# Patient Record
Sex: Female | Born: 1940
Health system: Southern US, Community
[De-identification: ages and names within clinical notes are randomized; demographics above are authoritative.]

## PROBLEM LIST (undated history)

## (undated) ENCOUNTER — Ambulatory Visit (HOSPITAL_COMMUNITY): Admission: EM | Payer: Medicare Other

## (undated) DIAGNOSIS — Z87440 Personal history of urinary (tract) infections: Secondary | ICD-10-CM

## (undated) DIAGNOSIS — R011 Cardiac murmur, unspecified: Secondary | ICD-10-CM

## (undated) DIAGNOSIS — N189 Chronic kidney disease, unspecified: Secondary | ICD-10-CM

## (undated) DIAGNOSIS — E039 Hypothyroidism, unspecified: Secondary | ICD-10-CM

## (undated) DIAGNOSIS — I10 Essential (primary) hypertension: Secondary | ICD-10-CM

## (undated) DIAGNOSIS — E785 Hyperlipidemia, unspecified: Secondary | ICD-10-CM

## (undated) DIAGNOSIS — M797 Fibromyalgia: Secondary | ICD-10-CM

## (undated) DIAGNOSIS — F329 Major depressive disorder, single episode, unspecified: Secondary | ICD-10-CM

## (undated) DIAGNOSIS — L0292 Furuncle, unspecified: Secondary | ICD-10-CM

## (undated) DIAGNOSIS — Z800 Family history of malignant neoplasm of digestive organs: Secondary | ICD-10-CM

## (undated) DIAGNOSIS — K5903 Drug induced constipation: Secondary | ICD-10-CM

## (undated) DIAGNOSIS — M1990 Unspecified osteoarthritis, unspecified site: Secondary | ICD-10-CM

## (undated) DIAGNOSIS — F32A Depression, unspecified: Secondary | ICD-10-CM

## (undated) DIAGNOSIS — R6889 Other general symptoms and signs: Secondary | ICD-10-CM

## (undated) DIAGNOSIS — H579 Unspecified disorder of eye and adnexa: Secondary | ICD-10-CM

## (undated) DIAGNOSIS — K219 Gastro-esophageal reflux disease without esophagitis: Secondary | ICD-10-CM

## (undated) DIAGNOSIS — R112 Nausea with vomiting, unspecified: Secondary | ICD-10-CM

## (undated) DIAGNOSIS — R351 Nocturia: Secondary | ICD-10-CM

## (undated) DIAGNOSIS — R350 Frequency of micturition: Secondary | ICD-10-CM

## (undated) DIAGNOSIS — M75100 Unspecified rotator cuff tear or rupture of unspecified shoulder, not specified as traumatic: Secondary | ICD-10-CM

## (undated) DIAGNOSIS — E669 Obesity, unspecified: Secondary | ICD-10-CM

## (undated) DIAGNOSIS — Z9889 Other specified postprocedural states: Secondary | ICD-10-CM

## (undated) HISTORY — DX: Depression, unspecified: F32.A

## (undated) HISTORY — DX: Major depressive disorder, single episode, unspecified: F32.9

## (undated) HISTORY — DX: Obesity, unspecified: E66.9

## (undated) HISTORY — DX: Chronic kidney disease, unspecified: N18.9

## (undated) HISTORY — PX: COLONOSCOPY: SHX174

## (undated) HISTORY — PX: SHOULDER SURGERY: SHX246

## (undated) HISTORY — DX: Essential (primary) hypertension: I10

## (undated) HISTORY — PX: DILATION AND CURETTAGE OF UTERUS: SHX78

## (undated) HISTORY — PX: BACK SURGERY: SHX140

## (undated) HISTORY — DX: Fibromyalgia: M79.7

## (undated) HISTORY — DX: Gastro-esophageal reflux disease without esophagitis: K21.9

## (undated) HISTORY — PX: STAPEDECTOMY: SHX2435

## (undated) HISTORY — DX: Family history of malignant neoplasm of digestive organs: Z80.0

---

## 1969-05-30 HISTORY — PX: CHOLECYSTECTOMY: SHX55

## 1969-05-30 HISTORY — PX: TUBAL LIGATION: SHX77

## 1998-07-13 ENCOUNTER — Other Ambulatory Visit: Admission: RE | Admit: 1998-07-13 | Discharge: 1998-07-13 | Payer: Self-pay | Admitting: Obstetrics and Gynecology

## 1999-07-26 ENCOUNTER — Other Ambulatory Visit: Admission: RE | Admit: 1999-07-26 | Discharge: 1999-07-26 | Payer: Self-pay | Admitting: Obstetrics and Gynecology

## 1999-07-30 ENCOUNTER — Encounter (INDEPENDENT_AMBULATORY_CARE_PROVIDER_SITE_OTHER): Payer: Self-pay | Admitting: Specialist

## 1999-07-30 ENCOUNTER — Other Ambulatory Visit: Admission: RE | Admit: 1999-07-30 | Discharge: 1999-07-30 | Payer: Self-pay | Admitting: Obstetrics and Gynecology

## 2000-07-31 ENCOUNTER — Other Ambulatory Visit: Admission: RE | Admit: 2000-07-31 | Discharge: 2000-07-31 | Payer: Self-pay | Admitting: Obstetrics and Gynecology

## 2000-08-01 ENCOUNTER — Encounter (INDEPENDENT_AMBULATORY_CARE_PROVIDER_SITE_OTHER): Payer: Self-pay | Admitting: Specialist

## 2000-08-01 ENCOUNTER — Other Ambulatory Visit: Admission: RE | Admit: 2000-08-01 | Discharge: 2000-08-01 | Payer: Self-pay | Admitting: Obstetrics and Gynecology

## 2001-08-06 ENCOUNTER — Other Ambulatory Visit: Admission: RE | Admit: 2001-08-06 | Discharge: 2001-08-06 | Payer: Self-pay | Admitting: Obstetrics and Gynecology

## 2002-05-03 ENCOUNTER — Encounter: Payer: Self-pay | Admitting: Orthopaedic Surgery

## 2002-05-03 ENCOUNTER — Encounter: Admission: RE | Admit: 2002-05-03 | Discharge: 2002-05-03 | Payer: Self-pay | Admitting: Orthopaedic Surgery

## 2003-03-25 ENCOUNTER — Encounter: Admission: RE | Admit: 2003-03-25 | Discharge: 2003-06-23 | Payer: Self-pay | Admitting: Family Medicine

## 2003-10-06 ENCOUNTER — Encounter: Admission: RE | Admit: 2003-10-06 | Discharge: 2003-10-06 | Payer: Self-pay | Admitting: Orthopaedic Surgery

## 2003-10-29 ENCOUNTER — Encounter: Admission: RE | Admit: 2003-10-29 | Discharge: 2003-10-29 | Payer: Self-pay | Admitting: Orthopaedic Surgery

## 2003-11-18 ENCOUNTER — Encounter: Admission: RE | Admit: 2003-11-18 | Discharge: 2003-11-18 | Payer: Self-pay | Admitting: Orthopaedic Surgery

## 2003-11-19 ENCOUNTER — Encounter: Admission: RE | Admit: 2003-11-19 | Discharge: 2004-02-17 | Payer: Self-pay | Admitting: Family Medicine

## 2003-12-05 ENCOUNTER — Encounter: Admission: RE | Admit: 2003-12-05 | Discharge: 2003-12-05 | Payer: Self-pay | Admitting: Orthopaedic Surgery

## 2004-03-15 ENCOUNTER — Encounter: Admission: RE | Admit: 2004-03-15 | Discharge: 2004-03-15 | Payer: Self-pay | Admitting: Neurological Surgery

## 2005-10-13 ENCOUNTER — Encounter: Admission: RE | Admit: 2005-10-13 | Discharge: 2005-10-13 | Payer: Self-pay

## 2007-04-16 ENCOUNTER — Ambulatory Visit: Payer: Self-pay | Admitting: Gastroenterology

## 2007-05-01 ENCOUNTER — Ambulatory Visit: Payer: Self-pay | Admitting: Gastroenterology

## 2007-06-15 ENCOUNTER — Ambulatory Visit: Payer: Self-pay | Admitting: Cardiology

## 2007-06-25 ENCOUNTER — Ambulatory Visit: Payer: Self-pay

## 2007-10-23 ENCOUNTER — Encounter: Admission: RE | Admit: 2007-10-23 | Discharge: 2007-10-23 | Payer: Self-pay | Admitting: Orthopaedic Surgery

## 2008-06-16 ENCOUNTER — Ambulatory Visit: Payer: Self-pay | Admitting: Cardiology

## 2009-03-25 ENCOUNTER — Encounter: Admission: RE | Admit: 2009-03-25 | Discharge: 2009-03-25 | Payer: Self-pay | Admitting: Orthopaedic Surgery

## 2009-05-01 ENCOUNTER — Telehealth (INDEPENDENT_AMBULATORY_CARE_PROVIDER_SITE_OTHER): Payer: Self-pay | Admitting: *Deleted

## 2009-05-04 ENCOUNTER — Telehealth (INDEPENDENT_AMBULATORY_CARE_PROVIDER_SITE_OTHER): Payer: Self-pay | Admitting: *Deleted

## 2009-06-26 ENCOUNTER — Encounter: Admission: RE | Admit: 2009-06-26 | Discharge: 2009-06-26 | Payer: Self-pay | Admitting: Orthopaedic Surgery

## 2009-12-25 ENCOUNTER — Encounter: Payer: Self-pay | Admitting: Gastroenterology

## 2010-01-22 ENCOUNTER — Encounter: Admission: RE | Admit: 2010-01-22 | Discharge: 2010-01-22 | Payer: Self-pay | Admitting: Orthopaedic Surgery

## 2010-06-02 ENCOUNTER — Ambulatory Visit: Admit: 2010-06-02 | Payer: Self-pay | Admitting: Cardiology

## 2010-06-14 ENCOUNTER — Ambulatory Visit: Admit: 2010-06-14 | Payer: Self-pay | Admitting: Cardiology

## 2010-07-01 NOTE — Letter (Signed)
Summary: Deboraha Sprang @ [facility] @ [employer]   Imported By: Sherian Rein 04/06/2010 07:15:20  _____________________________________________________________________  External Attachment:    Type:   Image     Comment:   External Document

## 2010-07-01 NOTE — Letter (Signed)
Summary: Pt's Medical Hx & Medication/[facility]   Pt's Medical Hx & Medication/[facility]   Imported By: Sherian Rein 04/06/2010 07:17:05  _____________________________________________________________________  External Attachment:    Type:   Image     Comment:   External Document

## 2010-07-01 NOTE — Progress Notes (Signed)
Summary: Records requested and faxed  [facility]ed [facility] to [facility] Attn: Olivia to [phone]. Stress and EKG.Dena Chavis  May 01, 2009 11:12 AM

## 2010-07-01 NOTE — Procedures (Signed)
Summary: Colonoscopy   Colonoscopy  Procedure date:  05/01/2007  Findings:      Results: Normal. Location:  [facility].    Procedures Next Due Date:    Colonoscopy: 04/2012 Patient Name: Abigail [name], Abigail [name]. MRN:  Procedure Procedures: Colonoscopy CPT: [phone].  Personnel: Endoscopist: Barbette Hair. Arlyce Dice, MD.  Exam Location: Outpatient  Patient Consent: Procedure, Alternatives, Risks and Benefits discussed, consent obtained, from patient.  Indications  Increased Risk Screening: For family history of colorectal neoplasia, in  sibling  History  Current Medications: Patient is not currently taking Coumadin.  Pre-Exam Physical: Performed May 01, 2007. Cardio-pulmonary exam, HEENT exam , Abdominal exam, Mental status exam WNL.  Comments: Patient history reviewed/updated, physical performed prior to initiation of sedation?yes Exam Exam: Extent of exam reached: Cecum, extent intended: Cecum.  The cecum was identified by IC valve. Time to Cecum: 00:13:02. Time for Withdrawl: 00:05:37. Colon retroflexion performed. ASA Classification: II. Tolerance: good.  Monitoring: Pulse and BP monitoring, Oximetry used. Supplemental O2 given. at 2 Liters.  Colon Prep Used Miralax for colon prep. Prep results: fair, adequate exam.  Sedation Meds: Patient assessed and found to be appropriate for moderate (conscious) sedation. Sedation was managed by the Endoscopist. Fentanyl 75 mcg. given IV. Versed 8 mg. given IV.  Findings - NORMAL EXAM: Cecum to Rectum.  NORMAL EXAM: Cecum.  NORMAL EXAM: Rectum.   Assessment Normal examination.  Events  Unplanned Interventions: No intervention was required.  Unplanned Events: There were no complications. Plans  Post Exam Instructions: Post sedation instructions given.  Patient Education: Patient given standard instructions for: a normal exam.  Disposition: After procedure patient sent to recovery. After recovery patient  sent home.  Scheduling/Referral: Colonoscopy, to Barbette Hair. Arlyce Dice, MD, around Apr 30, 2012.    cc: The Patient

## 2010-07-01 NOTE — Progress Notes (Signed)
Summary: Record request  Request for records received from the [facility]. Faxed Stress and EKG to [phone]. Dena Chavis  May 04, 2009 12:34 PM

## 2010-07-28 ENCOUNTER — Encounter: Payer: Self-pay | Admitting: Cardiology

## 2010-07-28 ENCOUNTER — Ambulatory Visit (INDEPENDENT_AMBULATORY_CARE_PROVIDER_SITE_OTHER): Payer: Medicare Other | Admitting: Cardiology

## 2010-07-28 DIAGNOSIS — R0602 Shortness of breath: Secondary | ICD-10-CM | POA: Insufficient documentation

## 2010-07-28 DIAGNOSIS — Z01810 Encounter for preprocedural cardiovascular examination: Secondary | ICD-10-CM

## 2010-08-05 NOTE — Assessment & Plan Note (Signed)
Summary: f2y/per pt call=mj   Visit Type:  2 yr follow up Primary Provider:  Dr. Judithann Sauger  CC:  sob and chest pressure.pt has a pain in the right side of neck. pt has no other complaints today.Marland Kitchen  History of Present Illness: Abigail [name] returns for hx of CP, SOB, and preop clearance for ear surgery. She has severe fibromyalgia whic atypical CP that is noncardiac. Negative stress nuclear in 2009.  She has multiple CRFs and is being treated appropriately by her Primary Care Dr Tenny Craw.She struggles with her weight . Her BMI is over 35.  EKG is stable.  Clinical Reports Reviewed:  Nuclear Study:  06/25/2007:  Excerise capacity: adenosine study with no exercise.  Blood Pressure response: normal blood pressure response.  Clinical symptoms: chest tight   ECG impression: no significant ST segment change suggestive of ischemia  Overall impression: normal stress nuclear study.      Current Medications (verified): 1)  Synthroid 137 Mcg Tabs (Levothyroxine Sodium) .... Take 1 Tablet By Mouth Once A Day 2)  Norvasc 10 Mg Tabs (Amlodipine Besylate) .... Take 1 Tablet By Mouth Once A Day 3)  Pravachol 80 Mg Tabs (Pravastatin Sodium) .... Take 1 Tablet By Mouth At Bedtime 4)  Estradiol 1 Mg Tabs (Estradiol) .... Take 1 Tablet By Mouth Once A Day 5)  Zoloft 100 Mg Tabs (Sertraline Hcl) .... Take 1 Tablet By Mouth Once Daily 6)  Neurontin 400 Mg Caps (Gabapentin) .Marland Kitchen.. 1 Tab Three Times A Day 7)  Metformin Hcl 850 Mg Tabs (Metformin Hcl) .... Take 1 Tablet By Mouth  Every Morning  and 2 Tablets By Mouth Every Evening 8)  Lisinopril-Hydrochlorothiazide 20-12.5 Mg Tabs (Lisinopril-Hydrochlorothiazide) .... Take 2 Tablets By Mouth Once Daily 9)  Accu- Chek Aviva .... Test Twice Daily As Directed 10)  Accu-Chek Multiclix Lancet Device .... Use As Directed 11)  Fluticasone Propionate 50 Mcg/act Susp (Fluticasone Propionate) .Marland Kitchen.. 1-2 Sprays Nasally As Needed 12)  Cyanocobalamin 1000 Mcg/ml Soln  (Cyanocobalamin) .... Monthly 13)  Glimepiride 4 Mg Tabs (Glimepiride) .... Take 1 Tablet By Mouth Once A Day 14)  B Complex  Tabs (B Complex Vitamins) .... As Directed Orally 15)  Hydrocodone-Acetaminophen 5-500 Mg Tabs (Hydrocodone-Acetaminophen) .Marland Kitchen.. 1 Capsule As Needed For Pain Every 6 Hours 16)  Nortriptyline Hcl 25 Mg Caps (Nortriptyline Hcl) .Marland Kitchen.. 1 Capsule By Mouth Two Times A Day 17)  Ambien 10 Mg Tabs (Zolpidem Tartrate) .Marland Kitchen.. 1 Tab At Bedtime As Needed 18)  Aspirin 81 Mg Tbec (Aspirin) .... Take One Tablet By Mouth Daily 19)  Multivitamins  Caps (Multiple Vitamin) .... Take 1 Tablet By Mouth Once A Day 20)  Vitamin D (Ergocalciferol) 50000 Unit Caps (Ergocalciferol) .... Once Weekly 21)  Tramadol Hcl 50 Mg Tabs (Tramadol Hcl) .Marland Kitchen.. 1-2 Tabs As Needed For Pain  Allergies (verified): 1)  ! Penicillin 2)  ! Cephalosporins  Past History:  Past Medical History: Last updated: 02/05/2010 incomplete right bundle branch block CAD Diabetes Type 2 Hyperlipidemia obesity fibromyalgia gerd urinary tract problems Family hx colorectal cancer  Past Surgical History: Last updated: 02/05/2010 Cholecystectomy Stapedectomy D&C Tubal Ligation  Family History: Last updated: 02/05/2010 Mother: MI @ 42 Family History of Colon Cancer:Brother  Social History: Last updated: 02/05/2010 Married  Environmental health practitioner, 3 children  Patient has never smoked.  Alcohol Use - no  Risk Factors: Smoking Status: never (02/05/2010)  Review of Systems       negative other than HPI  Vital Signs:  Patient profile:  70 year old female Height:      64 inches Weight:      207 pounds BMI:     35.66 Pulse rate:   70 / minute Resp:     18 per minute BP sitting:   142 / 78  (left arm) Cuff size:   large  Vitals Entered By: Celestia Khat, CMA (July 28, 2010 3:34 PM)  Physical Exam  General:  NAD, Morbidly obese Head:  normocephalic and atraumatic Eyes:  PERRLA/EOM intact;  conjunctiva and lids normal. Neck:  Neck supple, no JVD. No masses, thyromegaly or abnormal cervical nodes. Lungs:  Clear bilaterally to auscultation and percussion. Heart:  PMI poorly appreciated, RRR, NLS1 AND S2., no murmur or bruit Msk:  decreased ROM.   Pulses:  pulses normal in all 4 extremities Extremities:  trace left pedal edema and trace right pedal edema.   Neurologic:  Alert and oriented x 3. Skin:  Intact without lesions or rashes. Psych:  anxious.     Problems:  Medical Problems Added: 1)  Dx of Pre-operative Cardiovascular Examination  (ICD-V72.81) 2)  Dx of Shortness of Breath  (ICD-786.05)  Impression & Recommendations:  Problem # 1:  SHORTNESS OF BREATH (ICD-786.05) Assessment Unchanged  Multifactorial. Not cardiac. Have cleared for ear surgery.Will see back as needed. Her updated medication list for this problem includes:    Norvasc 10 Mg Tabs (Amlodipine besylate) .Marland Kitchen... Take 1 tablet by mouth once a day    Lisinopril-hydrochlorothiazide 20-12.5 Mg Tabs (Lisinopril-hydrochlorothiazide) .Marland Kitchen... Take 2 tablets by mouth once daily    Aspirin 81 Mg Tbec (Aspirin) .Marland Kitchen... Take one tablet by mouth daily  Orders: EKG w/ Interpretation (93000)  Patient Instructions: 1)  Your physician recommends that you schedule a follow-up appointment in: as needed with Dr. Daleen Squibb 2)  Your physician recommends that you continue on your current medications as directed. Please refer to the Current Medication list given to you today.

## 2010-10-12 NOTE — Assessment & Plan Note (Signed)
[facility]                            CARDIOLOGY OFFICE NOTE   NAME:[name], Abigail DELAGARZA                     MRN:          [id number]  DATE:06/16/2008                            DOB:          July 19, 1940    Abigail [name] comes in today for history of an abnormal EKG consisting of  an incomplete right bundle-branch block as well as a suggestion on her  previous evaluation of a prolonged QT.   She has done well except for increased stress at home.  As a  consequence, she has just kind of ignored her health, but has been  fortunate enough to lose some weight.  Her weight is down about 21-25  pounds depending on her skills and hours.   She follows along closely with Dr. Duane Lope.   She denies any angina or ischemic symptoms.  She has had no tachy  palpitations, syncope, or presyncope.   Stress Myoview June 25, 2007, showed EF of 59% with no ischemia.   CURRENT MEDICATIONS:  1. Synthroid 0.125 mg p.o. daily.  2. Norvasc 5 mg per day.  3. Pravachol 80 mg p.o. at bedtime.  4. Activella 1/0.05 one daily.  5. Nortriptyline 25 mg 3 caps at night.  6. Zoloft 100 mg p.o. daily.  7. Neurontin 400 mg p.o. t.i.d.  8. Metformin 850 mg 1 in the morning and 2 in the evening.  9. Glyburide 4 mg half a tablet daily.  10.Lisinopril/hydrochlorothiazide 20/12.5 two daily.  11.Clonazepam 0.5 half t.i.d.  12.Estradiol 1 mg daily.   PHYSICAL EXAMINATION:  VITAL SIGNS:  Her blood pressure today is 122/74,  pulse 71 and regular, weight is 211.  HEENT:  Normal.  NECK:  Carotid upstrokes were equal bilaterally without bruits.  No JVD.  Thyroid is not enlarged.  Trachea is midline.  LUNGS:  Clear to auscultation and percussion.  HEART:  Reveals a nondisplaced PMI.  Normal S1 and S2.  ABDOMEN:  Soft.  Good bowel sounds.  No midline bruit.  No hepatomegaly.  EXTREMITIES:  No cyanosis, clubbing, or edema.  Pulses are intact.  NEUROLOGIC:  Intact.   EKG today showed  sinus rhythm, incomplete right bundle, which is old.  QTC at most 450 milliseconds.  The rest of her intervals are normal.   Abigail [name] is doing well.  I have reassured her that her heart is doing  well at this point in time and that she has a strong heart and no sign  of obstructive coronary artery disease by her stress Myoview last year.  Secondary prevention is critical and I have emphasized this today  including good blood pressure control, cholesterol control, as well as  blood sugar control.  I am delighted that she has lost some weight.   I will see her back in 2 years or p.r.n.     Thomas C. Daleen Squibb, MD, [facility]  Electronically Signed    TCW/MedQ  DD: 06/16/2008  DT: 06/17/2008  Job #: [id number]   cc:   C. Duane Lope, M.D.

## 2010-10-12 NOTE — Assessment & Plan Note (Signed)
[facility]                            CARDIOLOGY OFFICE NOTE   NAME:Abigail [name], Abigail [name]                     MRN:          [id number]  DATE:06/15/2007                            DOB:          April 24, 1941    REASON FOR CONSULTATION:  I was asked by Dr. Estill [name] to evaluate [facility] for an incomplete right bundle branch block, prolonged QT  interval and cardiovascular risk factors.   She had a recent EKG by Dr. Duane [name].  The EKG read her QTC to be 524  milliseconds with an incomplete right bundle.  I agree with the  incomplete right bundle, but looking at these tracings, I would not call  her QTC that long.   Because of concern the amitriptyline may be associated with this, her  dose was decreased from 100 mg to 75 mg.   She has no history of tachy palpitations, chest pain or significant  dyspnea on exertion.   She has multiple cardiac risk factors including age, weight, sedentary  lifestyle, diabetes, hyperlipidemia and family history of coronary  disease.  Her mother had a heart attack at age 58.   MEDICATIONS:  1. Actos plus metformin 15/850 one in the morning two in the evening.  2. Synthroid 0.125 mg a day.  3. Lotensin 40 mg a day.  4. Norvasc 5 mg a day.  5. Micardis/HCT 40/12.5 daily.  6. Pravachol 80 mg q.h.s.  7. Activella.  8. Nortriptyline 75 mg q.h.s.  9. Zoloft 100 mg daily.  10.Neurontin 400 mg t.i.d. for neuropathy.   PAST SURGICAL HISTORY:  She had a cholecystectomy, stapedectomy and a  D&C in the past.   ALLERGIES:  1. PENICILLIN.  2. CEPHALOSPORINS.   FAMILY HISTORY:  As above.   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  She is a mother of 3 children.  Her husband has been very sick over the  last year or two, but actually escaped pancreatic cancer with a Whipple  procedure.   REVIEW OF SYSTEMS:  History of some fatigue, gastroesophageal reflux,  urinary tract problems,  arthritis and fibromyalgia.   PHYSICAL EXAMINATION:  VITAL SIGNS:  Blood pressure 130/66, her pulse is  76 by EKG.  She is in sinus rhythm.  I do not think that her QTC is  prolonged today.  It measures about 440 milliseconds.  Computer actually  reads 429 milliseconds.  She does have an incomplete right bundle.  Her  height is 5 feet 4 inches.  She weighs 232 pounds.  HEENT:  Normocephalic, atraumatic.  PERRLA.  Extraocular movements are  intact.  Sclera clear.  Face symmetry is normal.  Dentition  satisfactory.  NECK:  Carotid upstrokes were equal bilaterally without bruits, no JVD.  Thyroid is not enlarged.  Trachea is midline.  LUNGS:  Clear.  HEART:  Reveals a poorly appreciated PMI.  She has normal S1-S2.  ABDOMEN:  Soft with good bowel sounds.  No midline bruit.  No obvious  organomegaly.  EXTREMITIES:  Reveal no edema.  Pulses are intact.  NEURO:  Grossly intact.   ASSESSMENT:  1. Computer misread of a prolonged QTC.  2. Incomplete right bundle that is asymptomatic and not a major      clinical concern.  3. Multiple cardiac risk factors for coronary disease equivalency with      diabetes.   I have had a long talk with [name] today.  I am more worried about  occult obstructive coronary artery disease than anything else.   After much discussion, we set her for an adenosine rest stress Myoview.  If this is negative, we will continue with very aggressive secondary  risk factor modification as  done by Dr. Tenny Craw and Dr. Phylliss Bob.     Abigail C. Daleen Squibb, MD, [facility]  Electronically Signed    TCW/MedQ  DD: 06/15/2007  DT: 06/16/2007  Job #: [id number]   cc:   Abigail Picket, MD  Abigail [name], M.D.

## 2010-10-28 ENCOUNTER — Encounter: Payer: Self-pay | Admitting: Gastroenterology

## 2010-12-14 ENCOUNTER — Ambulatory Visit (INDEPENDENT_AMBULATORY_CARE_PROVIDER_SITE_OTHER): Payer: Medicare Other | Admitting: Gastroenterology

## 2010-12-14 ENCOUNTER — Encounter: Payer: Self-pay | Admitting: Gastroenterology

## 2010-12-14 VITALS — BP 126/64 | HR 80 | Ht 64.0 in | Wt 214.0 lb

## 2010-12-14 DIAGNOSIS — K5900 Constipation, unspecified: Secondary | ICD-10-CM

## 2010-12-14 DIAGNOSIS — R195 Other fecal abnormalities: Secondary | ICD-10-CM

## 2010-12-14 DIAGNOSIS — E119 Type 2 diabetes mellitus without complications: Secondary | ICD-10-CM

## 2010-12-14 DIAGNOSIS — I2510 Atherosclerotic heart disease of native coronary artery without angina pectoris: Secondary | ICD-10-CM | POA: Insufficient documentation

## 2010-12-14 DIAGNOSIS — Z800 Family history of malignant neoplasm of digestive organs: Secondary | ICD-10-CM

## 2010-12-14 MED ORDER — PEG-KCL-NACL-NASULF-NA ASC-C 100 G PO SOLR: 1.0000 | Freq: Once | ORAL | Status: DC

## 2010-12-14 NOTE — Progress Notes (Signed)
History of Present Illness:  Abigail [name] is a 70 year old white female   with coronary artery disease,  hypertension, diabetes and  obesity referred at request of Dr. Henderson Cloud  for Hemoccult-positive stool. This was noted on routine testing. Her main complaint is constipation which she attributes to her medicines for diabetes. Despite taking stool softeners and laxatives she may go several days without a spontaneous bowel movement. She denies rectal bleeding.  Family history is pertinent for her brother who had colon cancer. Last colonoscopy in 2008 was normal.  Review of Systems: Pertinent positive and negative review of systems were noted in the above HPI section. All other review of systems were otherwise negative.    Current Medications, Allergies, Past Medical History, Past Surgical History, Family History and Social History were reviewed in [facility] electronic medical record  Vital signs were reviewed in today's medical record. Physical Exam: General: Well developed , well nourished, no acute distress Head: Normocephalic and atraumatic Eyes:  sclerae anicteric, EOMI Ears: Normal auditory acuity Mouth: No deformity or lesions Lungs: Clear throughout to auscultation Heart: Regular rate and rhythm; no murmurs, rubs or bruits Abdomen: Soft, non tender and non distended. No masses, hepatosplenomegaly or hernias noted. Normal Bowel sounds Rectal:deferred Musculoskeletal: Symmetrical with no gross deformities  Pulses:  Normal pulses noted Extremities: No clubbing, cyanosis, edema or deformities noted Neurological: Alert oriented x 4, grossly nonfocal Psychological:  Alert and cooperative. Normal mood and affect

## 2010-12-14 NOTE — Assessment & Plan Note (Signed)
Plan colonoscopy in view of Hemoccult-positive stool

## 2010-12-14 NOTE — Assessment & Plan Note (Signed)
If colonoscopy is negative and I will work on a bowel regimen with her. In the interim I instructed her to take MiraLax every 3 days as necessary

## 2010-12-14 NOTE — Patient Instructions (Signed)
Colonoscopy A colonoscopy is an exam to evaluate your entire colon. In this exam, your colon is cleansed. A long fiberoptic tube is inserted through your rectum and into your colon. The fiberoptic scope (endoscope) is a long bundle of enclosed and very flexible fibers. These fibers transmit light to the area examined and send images from that area to your caregiver. Discomfort is usually minimal. You may be given a drug to help you sleep (sedative) during or prior to the procedure. This exam helps to detect lumps (tumors), polyps, inflammation, and areas of bleeding. Your caregiver may also take a small piece of tissue (biopsy) that will be examined under a microscope. BEFORE THE PROCEDURE  A clear liquid diet may be required for 2 days before the exam.   Liquid injections (enemas) or laxatives may be required.   A large amount of electrolyte solution may be given to you to drink over a short period of time. This solution is used to clean out your colon.   You should be present 1 prior to your procedure or as directed by your caregiver.   Check in at the admissions desk to fill out necessary forms if not preregistered. There will be consent forms to sign prior to the procedure. If accompanied by friends or family, there is a waiting area for them while you are having your procedure.  LET YOUR CAREGIVER KNOW ABOUT:  Allergies to food or medicine.  Medicines taken, including vitamins, herbs, eyedrops, over-the-counter medicines, and creams.   Use of steroids (by mouth or creams).   Previous problems with anesthetics or numbing medicines.   History of bleeding problems or blood clots.  Previous surgery.   Other health problems, including diabetes and kidney problems.   Possibility of pregnancy, if this applies.   AFTER THE PROCEDURE  If you received a sedative and/or pain medicine, you will need to arrange for someone to drive you home.   Occasionally, there is a little blood passed  with the first bowel movement. DO NOT be concerned.  HOME CARE INSTRUCTIONS  It is not unusual to pass moderate amounts of gas and experience mild abdominal cramping following the procedure. This is due to air being used to inflate your colon during the exam. Walking or a warm pack on your belly (abdomen) may help.   You may resume all normal meals and activities after sedatives and medicines have worn off.   Only take over-the-counter or prescription medicines for pain, discomfort, or fever as directed by your caregiver. DO NOT use aspirin or blood thinners if a biopsy was taken. Consult your caregiver for medicine usage if biopsies were taken.  FINDING OUT THE RESULTS OF YOUR TEST Not all test results are available during your visit. If your test results are not back during the visit, make an appointment with your caregiver to find out the results. Do not assume everything is normal if you have not heard from your caregiver or the medical facility. It is important for you to follow up on all of your test results. SEEK IMMEDIATE MEDICAL CARE IF:  You pass large blood clots or fill a toilet with blood following the procedure. This may also occur 10 to 14 days following the procedure. This is more likely if a biopsy was taken.   You develop abdominal pain that keeps getting worse and cannot be relieved with medicine.  Document Released: 05/13/2000 Document Re-Released: 08/10/2009 [facility] Patient Information 2011 [place], [place]. Your colonoscopy is scheduled on 12/27/2010 at  2:30pm  Your MoviPrep has been sent to your pharmacy today

## 2010-12-14 NOTE — Assessment & Plan Note (Signed)
Plan colonoscopy. If negative I will obtain followup Hemoccults and a CBC

## 2010-12-15 ENCOUNTER — Encounter: Payer: Self-pay | Admitting: Gastroenterology

## 2010-12-27 ENCOUNTER — Encounter: Payer: Self-pay | Admitting: Gastroenterology

## 2010-12-27 ENCOUNTER — Other Ambulatory Visit (INDEPENDENT_AMBULATORY_CARE_PROVIDER_SITE_OTHER): Payer: Medicare Other

## 2010-12-27 ENCOUNTER — Ambulatory Visit (AMBULATORY_SURGERY_CENTER): Payer: Medicare Other | Admitting: Gastroenterology

## 2010-12-27 ENCOUNTER — Other Ambulatory Visit: Payer: Self-pay | Admitting: *Deleted

## 2010-12-27 DIAGNOSIS — R195 Other fecal abnormalities: Secondary | ICD-10-CM

## 2010-12-27 DIAGNOSIS — K921 Melena: Secondary | ICD-10-CM

## 2010-12-27 DIAGNOSIS — Z1211 Encounter for screening for malignant neoplasm of colon: Secondary | ICD-10-CM

## 2010-12-27 LAB — CBC WITH DIFFERENTIAL/PLATELET
Basophils Absolute: 0.1 10*3/uL (ref 0.0–0.1)
Basophils Relative: 1.2 % (ref 0.0–3.0)
Eosinophils Absolute: 0.2 10*3/uL (ref 0.0–0.7)
Eosinophils Relative: 2.6 % (ref 0.0–5.0)
HCT: 32 % — ABNORMAL LOW (ref 36.0–46.0)
Hemoglobin: 10.7 g/dL — ABNORMAL LOW (ref 12.0–15.0)
Lymphocytes Relative: 29.4 % (ref 12.0–46.0)
Lymphs Abs: 2.5 10*3/uL (ref 0.7–4.0)
MCHC: 33.4 g/dL (ref 30.0–36.0)
MCV: 89.6 fl (ref 78.0–100.0)
Monocytes Absolute: 0.4 10*3/uL (ref 0.1–1.0)
Monocytes Relative: 4.8 % (ref 3.0–12.0)
Neutro Abs: 5.4 10*3/uL (ref 1.4–7.7)
Neutrophils Relative %: 62 % (ref 43.0–77.0)
Platelets: 331 10*3/uL (ref 150.0–400.0)
RBC: 3.57 Mil/uL — ABNORMAL LOW (ref 3.87–5.11)
RDW: 13.5 % (ref 11.5–14.6)
WBC: 8.7 10*3/uL (ref 4.5–10.5)

## 2010-12-27 LAB — GLUCOSE, CAPILLARY
Glucose-Capillary: 186 mg/dL — ABNORMAL HIGH (ref 70–99)
Glucose-Capillary: 126 mg/dL — ABNORMAL HIGH (ref 70–99)

## 2010-12-27 MED ORDER — SODIUM CHLORIDE 0.9 % IV SOLN: 500.0000 mL | INTRAVENOUS | Status: DC

## 2010-12-27 NOTE — Patient Instructions (Signed)
Please refer to the blue and neon green sheets regarding diet and activity for the rest of today. You may resume your medications as you would normally take them.

## 2010-12-28 ENCOUNTER — Telehealth: Payer: Self-pay | Admitting: *Deleted

## 2010-12-28 NOTE — Telephone Encounter (Signed)
No ID on Voice Mail. No message left. 

## 2010-12-30 ENCOUNTER — Telehealth: Payer: Self-pay | Admitting: *Deleted

## 2010-12-30 NOTE — Telephone Encounter (Signed)
Above noted 

## 2011-01-03 ENCOUNTER — Telehealth: Payer: Self-pay | Admitting: Gastroenterology

## 2011-01-03 NOTE — Telephone Encounter (Signed)
Pt states she had a colon last Monday and that she only went to the bathroom a little bit Saturday. States she has been taking Miralax twice a day and has not been going. Suggested pt try Magnesium Citrate. Instructed pt to drink 1/2 of the bottle and wait an hour to see if any results. If no results to finish the bottle. Pt verbalized understanding.

## 2011-01-06 NOTE — Telephone Encounter (Signed)
This encounter was opened in error.

## 2011-02-02 ENCOUNTER — Other Ambulatory Visit: Payer: Medicare Other

## 2011-02-11 ENCOUNTER — Other Ambulatory Visit: Payer: Self-pay | Admitting: Gastroenterology

## 2011-02-11 DIAGNOSIS — Z1289 Encounter for screening for malignant neoplasm of other sites: Secondary | ICD-10-CM

## 2011-02-11 LAB — HEMOCCULT SLIDES (X 3 CARDS)
Fecal Occult Blood: NEGATIVE
OCCULT 1: NEGATIVE
OCCULT 2: NEGATIVE
OCCULT 3: NEGATIVE
OCCULT 4: NEGATIVE
OCCULT 5: NEGATIVE

## 2011-02-11 NOTE — Progress Notes (Signed)
Quick Note:  Please inform the patient that lab work was normal and to continue current plan of action ______ 

## 2011-02-14 ENCOUNTER — Telehealth: Payer: Self-pay

## 2011-02-14 NOTE — Telephone Encounter (Signed)
Message copied by Michele Mcalpine on Mon Feb 14, 2011  9:12 AM ------      Message from: Melvia Heaps MD D      Created: Fri Feb 11, 2011  4:38 PM       Please inform the patient that lab work was normal and to continue current plan of action

## 2011-02-14 NOTE — Telephone Encounter (Signed)
Pt aware of results per Dr. Kaplan 

## 2011-06-15 DIAGNOSIS — I10 Essential (primary) hypertension: Secondary | ICD-10-CM | POA: Diagnosis not present

## 2011-06-15 DIAGNOSIS — J329 Chronic sinusitis, unspecified: Secondary | ICD-10-CM | POA: Diagnosis not present

## 2011-07-01 DIAGNOSIS — M25519 Pain in unspecified shoulder: Secondary | ICD-10-CM | POA: Diagnosis not present

## 2011-07-01 DIAGNOSIS — M542 Cervicalgia: Secondary | ICD-10-CM | POA: Diagnosis not present

## 2011-07-07 DIAGNOSIS — IMO0001 Reserved for inherently not codable concepts without codable children: Secondary | ICD-10-CM | POA: Diagnosis not present

## 2011-07-14 DIAGNOSIS — Z6841 Body Mass Index (BMI) 40.0 and over, adult: Secondary | ICD-10-CM | POA: Diagnosis not present

## 2011-07-14 DIAGNOSIS — E1142 Type 2 diabetes mellitus with diabetic polyneuropathy: Secondary | ICD-10-CM | POA: Diagnosis not present

## 2011-07-14 DIAGNOSIS — N189 Chronic kidney disease, unspecified: Secondary | ICD-10-CM | POA: Diagnosis not present

## 2011-07-14 DIAGNOSIS — E1149 Type 2 diabetes mellitus with other diabetic neurological complication: Secondary | ICD-10-CM | POA: Diagnosis not present

## 2011-07-22 ENCOUNTER — Other Ambulatory Visit: Payer: Self-pay | Admitting: Orthopaedic Surgery

## 2011-07-22 DIAGNOSIS — M25512 Pain in left shoulder: Secondary | ICD-10-CM

## 2011-07-26 ENCOUNTER — Ambulatory Visit
Admission: RE | Admit: 2011-07-26 | Discharge: 2011-07-26 | Disposition: A | Discharge status: Home / Self Care | Payer: Medicare Other | Source: Ambulatory Visit | Attending: Orthopaedic Surgery | Admitting: Orthopaedic Surgery

## 2011-07-26 DIAGNOSIS — M25512 Pain in left shoulder: Secondary | ICD-10-CM

## 2011-07-26 DIAGNOSIS — M25519 Pain in unspecified shoulder: Secondary | ICD-10-CM | POA: Diagnosis not present

## 2011-07-26 DIAGNOSIS — M19019 Primary osteoarthritis, unspecified shoulder: Secondary | ICD-10-CM | POA: Diagnosis not present

## 2011-08-01 DIAGNOSIS — M25519 Pain in unspecified shoulder: Secondary | ICD-10-CM | POA: Diagnosis not present

## 2011-08-02 DIAGNOSIS — N183 Chronic kidney disease, stage 3 unspecified: Secondary | ICD-10-CM | POA: Diagnosis not present

## 2011-08-02 DIAGNOSIS — E119 Type 2 diabetes mellitus without complications: Secondary | ICD-10-CM | POA: Diagnosis not present

## 2011-08-02 DIAGNOSIS — I10 Essential (primary) hypertension: Secondary | ICD-10-CM | POA: Diagnosis not present

## 2011-08-03 DIAGNOSIS — N183 Chronic kidney disease, stage 3 unspecified: Secondary | ICD-10-CM | POA: Diagnosis not present

## 2011-08-03 DIAGNOSIS — I10 Essential (primary) hypertension: Secondary | ICD-10-CM | POA: Diagnosis not present

## 2011-08-03 DIAGNOSIS — E119 Type 2 diabetes mellitus without complications: Secondary | ICD-10-CM | POA: Diagnosis not present

## 2011-08-16 DIAGNOSIS — M545 Low back pain, unspecified: Secondary | ICD-10-CM | POA: Diagnosis not present

## 2011-08-16 DIAGNOSIS — M47817 Spondylosis without myelopathy or radiculopathy, lumbosacral region: Secondary | ICD-10-CM | POA: Diagnosis not present

## 2011-08-18 DIAGNOSIS — M47817 Spondylosis without myelopathy or radiculopathy, lumbosacral region: Secondary | ICD-10-CM | POA: Diagnosis not present

## 2011-08-18 DIAGNOSIS — M545 Low back pain, unspecified: Secondary | ICD-10-CM | POA: Diagnosis not present

## 2011-08-25 DIAGNOSIS — IMO0001 Reserved for inherently not codable concepts without codable children: Secondary | ICD-10-CM | POA: Diagnosis not present

## 2011-08-30 DIAGNOSIS — E039 Hypothyroidism, unspecified: Secondary | ICD-10-CM | POA: Diagnosis not present

## 2011-08-30 DIAGNOSIS — E1142 Type 2 diabetes mellitus with diabetic polyneuropathy: Secondary | ICD-10-CM | POA: Diagnosis not present

## 2011-08-30 DIAGNOSIS — N189 Chronic kidney disease, unspecified: Secondary | ICD-10-CM | POA: Diagnosis not present

## 2011-08-30 DIAGNOSIS — E1149 Type 2 diabetes mellitus with other diabetic neurological complication: Secondary | ICD-10-CM | POA: Diagnosis not present

## 2011-08-30 DIAGNOSIS — Z6841 Body Mass Index (BMI) 40.0 and over, adult: Secondary | ICD-10-CM | POA: Diagnosis not present

## 2011-08-30 DIAGNOSIS — D510 Vitamin B12 deficiency anemia due to intrinsic factor deficiency: Secondary | ICD-10-CM | POA: Diagnosis not present

## 2011-09-02 ENCOUNTER — Telehealth: Payer: Self-pay | Admitting: Cardiology

## 2011-09-02 NOTE — Telephone Encounter (Signed)
Faxed EKG to [facility] [phone]. 4/5emg

## 2011-09-08 DIAGNOSIS — M159 Polyosteoarthritis, unspecified: Secondary | ICD-10-CM | POA: Diagnosis not present

## 2011-09-08 DIAGNOSIS — M24119 Other articular cartilage disorders, unspecified shoulder: Secondary | ICD-10-CM | POA: Diagnosis not present

## 2011-09-08 DIAGNOSIS — S43429A Sprain of unspecified rotator cuff capsule, initial encounter: Secondary | ICD-10-CM | POA: Diagnosis not present

## 2011-09-08 DIAGNOSIS — M659 Synovitis and tenosynovitis, unspecified: Secondary | ICD-10-CM | POA: Diagnosis not present

## 2011-09-08 DIAGNOSIS — M25819 Other specified joint disorders, unspecified shoulder: Secondary | ICD-10-CM | POA: Diagnosis not present

## 2011-09-08 DIAGNOSIS — M19019 Primary osteoarthritis, unspecified shoulder: Secondary | ICD-10-CM | POA: Diagnosis not present

## 2011-09-08 DIAGNOSIS — M719 Bursopathy, unspecified: Secondary | ICD-10-CM | POA: Diagnosis not present

## 2011-09-08 DIAGNOSIS — M67919 Unspecified disorder of synovium and tendon, unspecified shoulder: Secondary | ICD-10-CM | POA: Diagnosis not present

## 2011-09-09 DIAGNOSIS — E119 Type 2 diabetes mellitus without complications: Secondary | ICD-10-CM | POA: Diagnosis not present

## 2011-09-09 DIAGNOSIS — D649 Anemia, unspecified: Secondary | ICD-10-CM | POA: Diagnosis not present

## 2011-09-09 DIAGNOSIS — M542 Cervicalgia: Secondary | ICD-10-CM | POA: Diagnosis not present

## 2011-09-09 DIAGNOSIS — E039 Hypothyroidism, unspecified: Secondary | ICD-10-CM | POA: Diagnosis not present

## 2011-09-09 DIAGNOSIS — M545 Low back pain, unspecified: Secondary | ICD-10-CM | POA: Diagnosis not present

## 2011-09-09 DIAGNOSIS — S7000XA Contusion of unspecified hip, initial encounter: Secondary | ICD-10-CM | POA: Diagnosis not present

## 2011-09-09 DIAGNOSIS — W010XXA Fall on same level from slipping, tripping and stumbling without subsequent striking against object, initial encounter: Secondary | ICD-10-CM | POA: Diagnosis not present

## 2011-09-09 DIAGNOSIS — S0993XA Unspecified injury of face, initial encounter: Secondary | ICD-10-CM | POA: Diagnosis not present

## 2011-09-09 DIAGNOSIS — IMO0002 Reserved for concepts with insufficient information to code with codable children: Secondary | ICD-10-CM | POA: Diagnosis not present

## 2011-09-09 DIAGNOSIS — S0990XA Unspecified injury of head, initial encounter: Secondary | ICD-10-CM | POA: Diagnosis not present

## 2011-09-09 DIAGNOSIS — S79919A Unspecified injury of unspecified hip, initial encounter: Secondary | ICD-10-CM | POA: Diagnosis not present

## 2011-09-09 DIAGNOSIS — S79929A Unspecified injury of unspecified thigh, initial encounter: Secondary | ICD-10-CM | POA: Diagnosis not present

## 2011-09-09 DIAGNOSIS — Z9889 Other specified postprocedural states: Secondary | ICD-10-CM | POA: Diagnosis not present

## 2011-09-09 DIAGNOSIS — S20229A Contusion of unspecified back wall of thorax, initial encounter: Secondary | ICD-10-CM | POA: Diagnosis not present

## 2011-09-09 DIAGNOSIS — I10 Essential (primary) hypertension: Secondary | ICD-10-CM | POA: Diagnosis not present

## 2011-09-09 DIAGNOSIS — S199XXA Unspecified injury of neck, initial encounter: Secondary | ICD-10-CM | POA: Diagnosis not present

## 2011-09-20 DIAGNOSIS — M75120 Complete rotator cuff tear or rupture of unspecified shoulder, not specified as traumatic: Secondary | ICD-10-CM | POA: Diagnosis not present

## 2011-09-20 DIAGNOSIS — M25519 Pain in unspecified shoulder: Secondary | ICD-10-CM | POA: Diagnosis not present

## 2011-09-23 DIAGNOSIS — M25519 Pain in unspecified shoulder: Secondary | ICD-10-CM | POA: Diagnosis not present

## 2011-09-23 DIAGNOSIS — M75120 Complete rotator cuff tear or rupture of unspecified shoulder, not specified as traumatic: Secondary | ICD-10-CM | POA: Diagnosis not present

## 2011-09-28 DIAGNOSIS — M75120 Complete rotator cuff tear or rupture of unspecified shoulder, not specified as traumatic: Secondary | ICD-10-CM | POA: Diagnosis not present

## 2011-09-28 DIAGNOSIS — M25519 Pain in unspecified shoulder: Secondary | ICD-10-CM | POA: Diagnosis not present

## 2011-10-03 DIAGNOSIS — M25519 Pain in unspecified shoulder: Secondary | ICD-10-CM | POA: Diagnosis not present

## 2011-10-03 DIAGNOSIS — M75120 Complete rotator cuff tear or rupture of unspecified shoulder, not specified as traumatic: Secondary | ICD-10-CM | POA: Diagnosis not present

## 2011-10-10 DIAGNOSIS — M75120 Complete rotator cuff tear or rupture of unspecified shoulder, not specified as traumatic: Secondary | ICD-10-CM | POA: Diagnosis not present

## 2011-10-10 DIAGNOSIS — M25519 Pain in unspecified shoulder: Secondary | ICD-10-CM | POA: Diagnosis not present

## 2011-10-12 DIAGNOSIS — M75120 Complete rotator cuff tear or rupture of unspecified shoulder, not specified as traumatic: Secondary | ICD-10-CM | POA: Diagnosis not present

## 2011-10-12 DIAGNOSIS — M25519 Pain in unspecified shoulder: Secondary | ICD-10-CM | POA: Diagnosis not present

## 2011-10-26 DIAGNOSIS — M159 Polyosteoarthritis, unspecified: Secondary | ICD-10-CM | POA: Diagnosis not present

## 2011-10-26 DIAGNOSIS — IMO0002 Reserved for concepts with insufficient information to code with codable children: Secondary | ICD-10-CM | POA: Diagnosis not present

## 2011-10-26 DIAGNOSIS — S43429A Sprain of unspecified rotator cuff capsule, initial encounter: Secondary | ICD-10-CM | POA: Diagnosis not present

## 2011-10-26 DIAGNOSIS — M5030 Other cervical disc degeneration, unspecified cervical region: Secondary | ICD-10-CM | POA: Diagnosis not present

## 2011-10-26 DIAGNOSIS — IMO0001 Reserved for inherently not codable concepts without codable children: Secondary | ICD-10-CM | POA: Diagnosis not present

## 2011-11-01 DIAGNOSIS — E1149 Type 2 diabetes mellitus with other diabetic neurological complication: Secondary | ICD-10-CM | POA: Diagnosis not present

## 2011-11-01 DIAGNOSIS — E1142 Type 2 diabetes mellitus with diabetic polyneuropathy: Secondary | ICD-10-CM | POA: Diagnosis not present

## 2011-11-01 DIAGNOSIS — D510 Vitamin B12 deficiency anemia due to intrinsic factor deficiency: Secondary | ICD-10-CM | POA: Diagnosis not present

## 2011-11-01 DIAGNOSIS — E669 Obesity, unspecified: Secondary | ICD-10-CM | POA: Diagnosis not present

## 2011-11-01 DIAGNOSIS — E876 Hypokalemia: Secondary | ICD-10-CM | POA: Diagnosis not present

## 2011-11-01 DIAGNOSIS — E039 Hypothyroidism, unspecified: Secondary | ICD-10-CM | POA: Diagnosis not present

## 2011-11-01 DIAGNOSIS — N189 Chronic kidney disease, unspecified: Secondary | ICD-10-CM | POA: Diagnosis not present

## 2011-11-02 DIAGNOSIS — M75120 Complete rotator cuff tear or rupture of unspecified shoulder, not specified as traumatic: Secondary | ICD-10-CM | POA: Diagnosis not present

## 2011-11-02 DIAGNOSIS — M25519 Pain in unspecified shoulder: Secondary | ICD-10-CM | POA: Diagnosis not present

## 2011-11-04 DIAGNOSIS — M25519 Pain in unspecified shoulder: Secondary | ICD-10-CM | POA: Diagnosis not present

## 2011-11-04 DIAGNOSIS — M75120 Complete rotator cuff tear or rupture of unspecified shoulder, not specified as traumatic: Secondary | ICD-10-CM | POA: Diagnosis not present

## 2011-11-07 DIAGNOSIS — M25519 Pain in unspecified shoulder: Secondary | ICD-10-CM | POA: Diagnosis not present

## 2011-11-07 DIAGNOSIS — M75120 Complete rotator cuff tear or rupture of unspecified shoulder, not specified as traumatic: Secondary | ICD-10-CM | POA: Diagnosis not present

## 2011-11-10 DIAGNOSIS — M25519 Pain in unspecified shoulder: Secondary | ICD-10-CM | POA: Diagnosis not present

## 2011-11-10 DIAGNOSIS — M75120 Complete rotator cuff tear or rupture of unspecified shoulder, not specified as traumatic: Secondary | ICD-10-CM | POA: Diagnosis not present

## 2011-11-15 DIAGNOSIS — Z1289 Encounter for screening for malignant neoplasm of other sites: Secondary | ICD-10-CM | POA: Diagnosis not present

## 2011-11-15 DIAGNOSIS — Z1231 Encounter for screening mammogram for malignant neoplasm of breast: Secondary | ICD-10-CM | POA: Diagnosis not present

## 2011-11-16 DIAGNOSIS — M25519 Pain in unspecified shoulder: Secondary | ICD-10-CM | POA: Diagnosis not present

## 2011-11-16 DIAGNOSIS — M75120 Complete rotator cuff tear or rupture of unspecified shoulder, not specified as traumatic: Secondary | ICD-10-CM | POA: Diagnosis not present

## 2011-11-18 DIAGNOSIS — M75120 Complete rotator cuff tear or rupture of unspecified shoulder, not specified as traumatic: Secondary | ICD-10-CM | POA: Diagnosis not present

## 2011-11-18 DIAGNOSIS — M25519 Pain in unspecified shoulder: Secondary | ICD-10-CM | POA: Diagnosis not present

## 2011-11-21 DIAGNOSIS — M24619 Ankylosis, unspecified shoulder: Secondary | ICD-10-CM | POA: Diagnosis not present

## 2011-11-25 DIAGNOSIS — B029 Zoster without complications: Secondary | ICD-10-CM | POA: Diagnosis not present

## 2012-01-13 DIAGNOSIS — S43429A Sprain of unspecified rotator cuff capsule, initial encounter: Secondary | ICD-10-CM | POA: Diagnosis not present

## 2012-01-16 DIAGNOSIS — H2510 Age-related nuclear cataract, unspecified eye: Secondary | ICD-10-CM | POA: Diagnosis not present

## 2012-01-20 DIAGNOSIS — E538 Deficiency of other specified B group vitamins: Secondary | ICD-10-CM | POA: Diagnosis not present

## 2012-01-20 DIAGNOSIS — R350 Frequency of micturition: Secondary | ICD-10-CM | POA: Diagnosis not present

## 2012-01-20 DIAGNOSIS — R9389 Abnormal findings on diagnostic imaging of other specified body structures: Secondary | ICD-10-CM | POA: Diagnosis not present

## 2012-01-20 DIAGNOSIS — D510 Vitamin B12 deficiency anemia due to intrinsic factor deficiency: Secondary | ICD-10-CM | POA: Diagnosis not present

## 2012-01-20 DIAGNOSIS — B0229 Other postherpetic nervous system involvement: Secondary | ICD-10-CM | POA: Diagnosis not present

## 2012-02-02 DIAGNOSIS — N189 Chronic kidney disease, unspecified: Secondary | ICD-10-CM | POA: Diagnosis not present

## 2012-02-02 DIAGNOSIS — E039 Hypothyroidism, unspecified: Secondary | ICD-10-CM | POA: Diagnosis not present

## 2012-02-02 DIAGNOSIS — E1149 Type 2 diabetes mellitus with other diabetic neurological complication: Secondary | ICD-10-CM | POA: Diagnosis not present

## 2012-02-02 DIAGNOSIS — D510 Vitamin B12 deficiency anemia due to intrinsic factor deficiency: Secondary | ICD-10-CM | POA: Diagnosis not present

## 2012-02-02 DIAGNOSIS — E1142 Type 2 diabetes mellitus with diabetic polyneuropathy: Secondary | ICD-10-CM | POA: Diagnosis not present

## 2012-02-02 DIAGNOSIS — E669 Obesity, unspecified: Secondary | ICD-10-CM | POA: Diagnosis not present

## 2012-02-29 DIAGNOSIS — M549 Dorsalgia, unspecified: Secondary | ICD-10-CM | POA: Diagnosis not present

## 2012-02-29 DIAGNOSIS — Z23 Encounter for immunization: Secondary | ICD-10-CM | POA: Diagnosis not present

## 2012-02-29 DIAGNOSIS — R609 Edema, unspecified: Secondary | ICD-10-CM | POA: Diagnosis not present

## 2012-03-19 DIAGNOSIS — M1710 Unilateral primary osteoarthritis, unspecified knee: Secondary | ICD-10-CM | POA: Diagnosis not present

## 2012-03-22 DIAGNOSIS — R209 Unspecified disturbances of skin sensation: Secondary | ICD-10-CM | POA: Diagnosis not present

## 2012-03-22 DIAGNOSIS — IMO0002 Reserved for concepts with insufficient information to code with codable children: Secondary | ICD-10-CM | POA: Diagnosis not present

## 2012-03-27 DIAGNOSIS — E1142 Type 2 diabetes mellitus with diabetic polyneuropathy: Secondary | ICD-10-CM | POA: Diagnosis not present

## 2012-03-27 DIAGNOSIS — Z79899 Other long term (current) drug therapy: Secondary | ICD-10-CM | POA: Diagnosis not present

## 2012-03-27 DIAGNOSIS — M542 Cervicalgia: Secondary | ICD-10-CM | POA: Diagnosis not present

## 2012-03-27 DIAGNOSIS — M25819 Other specified joint disorders, unspecified shoulder: Secondary | ICD-10-CM | POA: Diagnosis not present

## 2012-03-27 DIAGNOSIS — G894 Chronic pain syndrome: Secondary | ICD-10-CM | POA: Diagnosis not present

## 2012-03-29 ENCOUNTER — Other Ambulatory Visit: Payer: Self-pay | Admitting: Pain Medicine

## 2012-03-29 DIAGNOSIS — M549 Dorsalgia, unspecified: Secondary | ICD-10-CM

## 2012-03-29 DIAGNOSIS — M542 Cervicalgia: Secondary | ICD-10-CM

## 2012-04-03 ENCOUNTER — Ambulatory Visit
Admission: RE | Admit: 2012-04-03 | Discharge: 2012-04-03 | Disposition: A | Discharge status: Home / Self Care | Payer: Medicare Other | Source: Ambulatory Visit | Attending: Pain Medicine | Admitting: Pain Medicine

## 2012-04-03 DIAGNOSIS — G5600 Carpal tunnel syndrome, unspecified upper limb: Secondary | ICD-10-CM | POA: Diagnosis not present

## 2012-04-03 DIAGNOSIS — M48061 Spinal stenosis, lumbar region without neurogenic claudication: Secondary | ICD-10-CM | POA: Diagnosis not present

## 2012-04-03 DIAGNOSIS — R7982 Elevated C-reactive protein (CRP): Secondary | ICD-10-CM | POA: Diagnosis not present

## 2012-04-03 DIAGNOSIS — M47812 Spondylosis without myelopathy or radiculopathy, cervical region: Secondary | ICD-10-CM | POA: Diagnosis not present

## 2012-04-03 DIAGNOSIS — M5137 Other intervertebral disc degeneration, lumbosacral region: Secondary | ICD-10-CM | POA: Diagnosis not present

## 2012-04-03 DIAGNOSIS — M47817 Spondylosis without myelopathy or radiculopathy, lumbosacral region: Secondary | ICD-10-CM | POA: Diagnosis not present

## 2012-04-03 DIAGNOSIS — M542 Cervicalgia: Secondary | ICD-10-CM

## 2012-04-03 DIAGNOSIS — M549 Dorsalgia, unspecified: Secondary | ICD-10-CM

## 2012-04-10 DIAGNOSIS — R609 Edema, unspecified: Secondary | ICD-10-CM | POA: Diagnosis not present

## 2012-04-11 ENCOUNTER — Ambulatory Visit
Admission: RE | Admit: 2012-04-11 | Discharge: 2012-04-11 | Disposition: A | Discharge status: Home / Self Care | Payer: Medicare Other | Source: Ambulatory Visit | Attending: Family Medicine | Admitting: Family Medicine

## 2012-04-11 ENCOUNTER — Other Ambulatory Visit: Payer: Self-pay | Admitting: Family Medicine

## 2012-04-11 DIAGNOSIS — R52 Pain, unspecified: Secondary | ICD-10-CM

## 2012-04-11 DIAGNOSIS — G5600 Carpal tunnel syndrome, unspecified upper limb: Secondary | ICD-10-CM | POA: Diagnosis not present

## 2012-04-11 DIAGNOSIS — R609 Edema, unspecified: Secondary | ICD-10-CM

## 2012-04-11 DIAGNOSIS — M79609 Pain in unspecified limb: Secondary | ICD-10-CM | POA: Diagnosis not present

## 2012-04-11 DIAGNOSIS — M7989 Other specified soft tissue disorders: Secondary | ICD-10-CM | POA: Diagnosis not present

## 2012-04-17 DIAGNOSIS — G894 Chronic pain syndrome: Secondary | ICD-10-CM | POA: Diagnosis not present

## 2012-04-17 DIAGNOSIS — G5600 Carpal tunnel syndrome, unspecified upper limb: Secondary | ICD-10-CM | POA: Diagnosis not present

## 2012-04-17 DIAGNOSIS — M5137 Other intervertebral disc degeneration, lumbosacral region: Secondary | ICD-10-CM | POA: Diagnosis not present

## 2012-04-17 DIAGNOSIS — M542 Cervicalgia: Secondary | ICD-10-CM | POA: Diagnosis not present

## 2012-05-01 DIAGNOSIS — IMO0002 Reserved for concepts with insufficient information to code with codable children: Secondary | ICD-10-CM | POA: Diagnosis not present

## 2012-05-01 DIAGNOSIS — IMO0001 Reserved for inherently not codable concepts without codable children: Secondary | ICD-10-CM | POA: Diagnosis not present

## 2012-05-01 DIAGNOSIS — M159 Polyosteoarthritis, unspecified: Secondary | ICD-10-CM | POA: Diagnosis not present

## 2012-05-01 DIAGNOSIS — M79609 Pain in unspecified limb: Secondary | ICD-10-CM | POA: Diagnosis not present

## 2012-05-08 DIAGNOSIS — E1142 Type 2 diabetes mellitus with diabetic polyneuropathy: Secondary | ICD-10-CM | POA: Diagnosis not present

## 2012-05-08 DIAGNOSIS — M461 Sacroiliitis, not elsewhere classified: Secondary | ICD-10-CM | POA: Diagnosis not present

## 2012-05-08 DIAGNOSIS — E669 Obesity, unspecified: Secondary | ICD-10-CM | POA: Diagnosis not present

## 2012-05-08 DIAGNOSIS — N189 Chronic kidney disease, unspecified: Secondary | ICD-10-CM | POA: Diagnosis not present

## 2012-05-08 DIAGNOSIS — E039 Hypothyroidism, unspecified: Secondary | ICD-10-CM | POA: Diagnosis not present

## 2012-05-08 DIAGNOSIS — E119 Type 2 diabetes mellitus without complications: Secondary | ICD-10-CM | POA: Diagnosis not present

## 2012-05-08 DIAGNOSIS — D510 Vitamin B12 deficiency anemia due to intrinsic factor deficiency: Secondary | ICD-10-CM | POA: Diagnosis not present

## 2012-05-08 DIAGNOSIS — E1149 Type 2 diabetes mellitus with other diabetic neurological complication: Secondary | ICD-10-CM | POA: Diagnosis not present

## 2012-05-08 DIAGNOSIS — M79609 Pain in unspecified limb: Secondary | ICD-10-CM | POA: Diagnosis not present

## 2012-05-15 DIAGNOSIS — Z79899 Other long term (current) drug therapy: Secondary | ICD-10-CM | POA: Diagnosis not present

## 2012-05-15 DIAGNOSIS — M542 Cervicalgia: Secondary | ICD-10-CM | POA: Diagnosis not present

## 2012-05-15 DIAGNOSIS — IMO0001 Reserved for inherently not codable concepts without codable children: Secondary | ICD-10-CM | POA: Diagnosis not present

## 2012-05-15 DIAGNOSIS — G894 Chronic pain syndrome: Secondary | ICD-10-CM | POA: Diagnosis not present

## 2012-05-15 DIAGNOSIS — E1142 Type 2 diabetes mellitus with diabetic polyneuropathy: Secondary | ICD-10-CM | POA: Diagnosis not present

## 2012-05-16 DIAGNOSIS — M76899 Other specified enthesopathies of unspecified lower limb, excluding foot: Secondary | ICD-10-CM | POA: Diagnosis not present

## 2012-05-25 DIAGNOSIS — H9209 Otalgia, unspecified ear: Secondary | ICD-10-CM | POA: Diagnosis not present

## 2012-05-25 DIAGNOSIS — J019 Acute sinusitis, unspecified: Secondary | ICD-10-CM | POA: Diagnosis not present

## 2012-06-05 DIAGNOSIS — M461 Sacroiliitis, not elsewhere classified: Secondary | ICD-10-CM | POA: Diagnosis not present

## 2012-06-05 DIAGNOSIS — H9209 Otalgia, unspecified ear: Secondary | ICD-10-CM | POA: Diagnosis not present

## 2012-06-08 DIAGNOSIS — H8090 Unspecified otosclerosis, unspecified ear: Secondary | ICD-10-CM | POA: Diagnosis not present

## 2012-06-08 DIAGNOSIS — M542 Cervicalgia: Secondary | ICD-10-CM | POA: Diagnosis not present

## 2012-06-08 DIAGNOSIS — H93299 Other abnormal auditory perceptions, unspecified ear: Secondary | ICD-10-CM | POA: Diagnosis not present

## 2012-06-08 DIAGNOSIS — H9209 Otalgia, unspecified ear: Secondary | ICD-10-CM | POA: Diagnosis not present

## 2012-06-12 DIAGNOSIS — IMO0002 Reserved for concepts with insufficient information to code with codable children: Secondary | ICD-10-CM | POA: Diagnosis not present

## 2012-06-12 DIAGNOSIS — R300 Dysuria: Secondary | ICD-10-CM | POA: Diagnosis not present

## 2012-06-12 DIAGNOSIS — M5137 Other intervertebral disc degeneration, lumbosacral region: Secondary | ICD-10-CM | POA: Diagnosis not present

## 2012-06-12 DIAGNOSIS — M5380 Other specified dorsopathies, site unspecified: Secondary | ICD-10-CM | POA: Diagnosis not present

## 2012-06-12 DIAGNOSIS — M461 Sacroiliitis, not elsewhere classified: Secondary | ICD-10-CM | POA: Diagnosis not present

## 2012-06-12 DIAGNOSIS — G894 Chronic pain syndrome: Secondary | ICD-10-CM | POA: Diagnosis not present

## 2012-06-15 ENCOUNTER — Other Ambulatory Visit (HOSPITAL_BASED_OUTPATIENT_CLINIC_OR_DEPARTMENT_OTHER): Payer: Self-pay | Admitting: Family Medicine

## 2012-06-15 ENCOUNTER — Ambulatory Visit (HOSPITAL_BASED_OUTPATIENT_CLINIC_OR_DEPARTMENT_OTHER)
Admission: RE | Admit: 2012-06-15 | Discharge: 2012-06-15 | Disposition: A | Discharge status: Home / Self Care | Payer: Medicare Other | Source: Ambulatory Visit | Attending: Family Medicine | Admitting: Family Medicine

## 2012-06-15 ENCOUNTER — Other Ambulatory Visit (HOSPITAL_BASED_OUTPATIENT_CLINIC_OR_DEPARTMENT_OTHER): Payer: Medicare Other

## 2012-06-15 DIAGNOSIS — R300 Dysuria: Secondary | ICD-10-CM | POA: Diagnosis not present

## 2012-06-15 DIAGNOSIS — R319 Hematuria, unspecified: Secondary | ICD-10-CM

## 2012-06-15 DIAGNOSIS — M549 Dorsalgia, unspecified: Secondary | ICD-10-CM | POA: Diagnosis not present

## 2012-06-15 DIAGNOSIS — R109 Unspecified abdominal pain: Secondary | ICD-10-CM | POA: Insufficient documentation

## 2012-06-22 DIAGNOSIS — E559 Vitamin D deficiency, unspecified: Secondary | ICD-10-CM | POA: Diagnosis not present

## 2012-06-22 DIAGNOSIS — R319 Hematuria, unspecified: Secondary | ICD-10-CM | POA: Diagnosis not present

## 2012-06-27 DIAGNOSIS — R7309 Other abnormal glucose: Secondary | ICD-10-CM | POA: Diagnosis not present

## 2012-06-27 DIAGNOSIS — E860 Dehydration: Secondary | ICD-10-CM | POA: Diagnosis not present

## 2012-06-27 DIAGNOSIS — A088 Other specified intestinal infections: Secondary | ICD-10-CM | POA: Diagnosis not present

## 2012-07-10 DIAGNOSIS — Z79899 Other long term (current) drug therapy: Secondary | ICD-10-CM | POA: Diagnosis not present

## 2012-07-10 DIAGNOSIS — IMO0001 Reserved for inherently not codable concepts without codable children: Secondary | ICD-10-CM | POA: Diagnosis not present

## 2012-07-10 DIAGNOSIS — M5137 Other intervertebral disc degeneration, lumbosacral region: Secondary | ICD-10-CM | POA: Diagnosis not present

## 2012-07-10 DIAGNOSIS — G894 Chronic pain syndrome: Secondary | ICD-10-CM | POA: Diagnosis not present

## 2012-07-30 DIAGNOSIS — N390 Urinary tract infection, site not specified: Secondary | ICD-10-CM | POA: Diagnosis not present

## 2012-07-30 DIAGNOSIS — R300 Dysuria: Secondary | ICD-10-CM | POA: Diagnosis not present

## 2012-07-30 DIAGNOSIS — R109 Unspecified abdominal pain: Secondary | ICD-10-CM | POA: Diagnosis not present

## 2012-08-01 DIAGNOSIS — E1129 Type 2 diabetes mellitus with other diabetic kidney complication: Secondary | ICD-10-CM | POA: Diagnosis not present

## 2012-08-01 DIAGNOSIS — I120 Hypertensive chronic kidney disease with stage 5 chronic kidney disease or end stage renal disease: Secondary | ICD-10-CM | POA: Diagnosis not present

## 2012-08-01 DIAGNOSIS — N183 Chronic kidney disease, stage 3 unspecified: Secondary | ICD-10-CM | POA: Diagnosis not present

## 2012-08-01 DIAGNOSIS — E119 Type 2 diabetes mellitus without complications: Secondary | ICD-10-CM | POA: Diagnosis not present

## 2012-08-07 DIAGNOSIS — N189 Chronic kidney disease, unspecified: Secondary | ICD-10-CM | POA: Diagnosis not present

## 2012-08-07 DIAGNOSIS — R300 Dysuria: Secondary | ICD-10-CM | POA: Diagnosis not present

## 2012-08-07 DIAGNOSIS — E1149 Type 2 diabetes mellitus with other diabetic neurological complication: Secondary | ICD-10-CM | POA: Diagnosis not present

## 2012-08-07 DIAGNOSIS — E669 Obesity, unspecified: Secondary | ICD-10-CM | POA: Diagnosis not present

## 2012-08-07 DIAGNOSIS — E039 Hypothyroidism, unspecified: Secondary | ICD-10-CM | POA: Diagnosis not present

## 2012-08-07 DIAGNOSIS — Z23 Encounter for immunization: Secondary | ICD-10-CM | POA: Diagnosis not present

## 2012-08-07 DIAGNOSIS — D510 Vitamin B12 deficiency anemia due to intrinsic factor deficiency: Secondary | ICD-10-CM | POA: Diagnosis not present

## 2012-08-07 DIAGNOSIS — E1142 Type 2 diabetes mellitus with diabetic polyneuropathy: Secondary | ICD-10-CM | POA: Diagnosis not present

## 2012-08-08 DIAGNOSIS — R300 Dysuria: Secondary | ICD-10-CM | POA: Diagnosis not present

## 2012-08-21 DIAGNOSIS — N951 Menopausal and female climacteric states: Secondary | ICD-10-CM | POA: Diagnosis not present

## 2012-08-22 ENCOUNTER — Other Ambulatory Visit: Payer: Self-pay | Admitting: Family Medicine

## 2012-08-22 ENCOUNTER — Ambulatory Visit (INDEPENDENT_AMBULATORY_CARE_PROVIDER_SITE_OTHER): Payer: Medicare Other

## 2012-08-22 DIAGNOSIS — S0990XA Unspecified injury of head, initial encounter: Secondary | ICD-10-CM | POA: Diagnosis not present

## 2012-08-22 DIAGNOSIS — S199XXA Unspecified injury of neck, initial encounter: Secondary | ICD-10-CM

## 2012-08-22 DIAGNOSIS — S060X9A Concussion with loss of consciousness of unspecified duration, initial encounter: Secondary | ICD-10-CM | POA: Diagnosis not present

## 2012-08-22 DIAGNOSIS — W19XXXA Unspecified fall, initial encounter: Secondary | ICD-10-CM

## 2012-08-22 DIAGNOSIS — S0003XA Contusion of scalp, initial encounter: Secondary | ICD-10-CM | POA: Diagnosis not present

## 2012-08-22 DIAGNOSIS — M5030 Other cervical disc degeneration, unspecified cervical region: Secondary | ICD-10-CM

## 2012-08-22 DIAGNOSIS — R52 Pain, unspecified: Secondary | ICD-10-CM

## 2012-08-22 DIAGNOSIS — J0130 Acute sphenoidal sinusitis, unspecified: Secondary | ICD-10-CM

## 2012-08-22 DIAGNOSIS — T1490XA Injury, unspecified, initial encounter: Secondary | ICD-10-CM

## 2012-08-22 DIAGNOSIS — R51 Headache: Secondary | ICD-10-CM | POA: Diagnosis not present

## 2012-08-22 DIAGNOSIS — S0993XA Unspecified injury of face, initial encounter: Secondary | ICD-10-CM

## 2012-08-22 DIAGNOSIS — S1093XA Contusion of unspecified part of neck, initial encounter: Secondary | ICD-10-CM

## 2012-08-30 DIAGNOSIS — I10 Essential (primary) hypertension: Secondary | ICD-10-CM | POA: Diagnosis not present

## 2012-08-30 DIAGNOSIS — R269 Unspecified abnormalities of gait and mobility: Secondary | ICD-10-CM | POA: Diagnosis not present

## 2012-08-30 DIAGNOSIS — E559 Vitamin D deficiency, unspecified: Secondary | ICD-10-CM | POA: Diagnosis not present

## 2012-08-30 DIAGNOSIS — Z0000 Encounter for general adult medical examination without abnormal findings: Secondary | ICD-10-CM | POA: Diagnosis not present

## 2012-08-30 DIAGNOSIS — R3915 Urgency of urination: Secondary | ICD-10-CM | POA: Diagnosis not present

## 2012-09-04 DIAGNOSIS — Z79899 Other long term (current) drug therapy: Secondary | ICD-10-CM | POA: Diagnosis not present

## 2012-09-04 DIAGNOSIS — M5137 Other intervertebral disc degeneration, lumbosacral region: Secondary | ICD-10-CM | POA: Diagnosis not present

## 2012-09-04 DIAGNOSIS — G894 Chronic pain syndrome: Secondary | ICD-10-CM | POA: Diagnosis not present

## 2012-09-04 DIAGNOSIS — M5030 Other cervical disc degeneration, unspecified cervical region: Secondary | ICD-10-CM | POA: Diagnosis not present

## 2012-09-12 ENCOUNTER — Encounter (HOSPITAL_COMMUNITY): Payer: Self-pay | Admitting: Emergency Medicine

## 2012-09-12 ENCOUNTER — Inpatient Hospital Stay (HOSPITAL_COMMUNITY)
Admission: EM | Admit: 2012-09-12 | Discharge: 2012-09-14 | DRG: 690 | Disposition: A | Discharge status: Home / Self Care | Payer: Medicare Other | Attending: Internal Medicine | Admitting: Internal Medicine

## 2012-09-12 ENCOUNTER — Ambulatory Visit: Payer: Medicare Other | Admitting: Rehabilitation

## 2012-09-12 DIAGNOSIS — K219 Gastro-esophageal reflux disease without esophagitis: Secondary | ICD-10-CM | POA: Diagnosis present

## 2012-09-12 DIAGNOSIS — N390 Urinary tract infection, site not specified: Secondary | ICD-10-CM | POA: Diagnosis not present

## 2012-09-12 DIAGNOSIS — Z794 Long term (current) use of insulin: Secondary | ICD-10-CM | POA: Diagnosis not present

## 2012-09-12 DIAGNOSIS — E876 Hypokalemia: Secondary | ICD-10-CM | POA: Diagnosis present

## 2012-09-12 DIAGNOSIS — IMO0001 Reserved for inherently not codable concepts without codable children: Secondary | ICD-10-CM | POA: Diagnosis present

## 2012-09-12 DIAGNOSIS — D72829 Elevated white blood cell count, unspecified: Secondary | ICD-10-CM | POA: Diagnosis present

## 2012-09-12 DIAGNOSIS — F329 Major depressive disorder, single episode, unspecified: Secondary | ICD-10-CM | POA: Diagnosis present

## 2012-09-12 DIAGNOSIS — E119 Type 2 diabetes mellitus without complications: Secondary | ICD-10-CM | POA: Diagnosis present

## 2012-09-12 DIAGNOSIS — I10 Essential (primary) hypertension: Secondary | ICD-10-CM | POA: Diagnosis present

## 2012-09-12 DIAGNOSIS — E039 Hypothyroidism, unspecified: Secondary | ICD-10-CM | POA: Diagnosis present

## 2012-09-12 DIAGNOSIS — R195 Other fecal abnormalities: Secondary | ICD-10-CM

## 2012-09-12 DIAGNOSIS — E785 Hyperlipidemia, unspecified: Secondary | ICD-10-CM | POA: Diagnosis present

## 2012-09-12 DIAGNOSIS — E860 Dehydration: Secondary | ICD-10-CM | POA: Diagnosis not present

## 2012-09-12 DIAGNOSIS — Z79899 Other long term (current) drug therapy: Secondary | ICD-10-CM | POA: Diagnosis not present

## 2012-09-12 DIAGNOSIS — Z800 Family history of malignant neoplasm of digestive organs: Secondary | ICD-10-CM

## 2012-09-12 DIAGNOSIS — R300 Dysuria: Secondary | ICD-10-CM | POA: Diagnosis not present

## 2012-09-12 DIAGNOSIS — F3289 Other specified depressive episodes: Secondary | ICD-10-CM | POA: Diagnosis present

## 2012-09-12 DIAGNOSIS — R0602 Shortness of breath: Secondary | ICD-10-CM

## 2012-09-12 DIAGNOSIS — I2510 Atherosclerotic heart disease of native coronary artery without angina pectoris: Secondary | ICD-10-CM

## 2012-09-12 HISTORY — DX: Other specified postprocedural states: Z98.890

## 2012-09-12 HISTORY — DX: Nausea with vomiting, unspecified: R11.2

## 2012-09-12 LAB — CBC WITH DIFFERENTIAL/PLATELET
Basophils Absolute: 0.1 10*3/uL (ref 0.0–0.1)
Basophils Relative: 1 % (ref 0–1)
Eosinophils Absolute: 0.4 10*3/uL (ref 0.0–0.7)
Eosinophils Relative: 3 % (ref 0–5)
HCT: 32.1 % — ABNORMAL LOW (ref 36.0–46.0)
Hemoglobin: 10.5 g/dL — ABNORMAL LOW (ref 12.0–15.0)
Lymphocytes Relative: 15 % (ref 12–46)
Lymphs Abs: 1.9 10*3/uL (ref 0.7–4.0)
MCH: 30 pg (ref 26.0–34.0)
MCHC: 32.7 g/dL (ref 30.0–36.0)
MCV: 91.7 fL (ref 78.0–100.0)
Monocytes Absolute: 0.6 10*3/uL (ref 0.1–1.0)
Monocytes Relative: 5 % (ref 3–12)
Neutro Abs: 9.7 10*3/uL — ABNORMAL HIGH (ref 1.7–7.7)
Neutrophils Relative %: 77 % (ref 43–77)
Platelets: 269 10*3/uL (ref 150–400)
RBC: 3.5 MIL/uL — ABNORMAL LOW (ref 3.87–5.11)
RDW: 13.4 % (ref 11.5–15.5)
WBC: 12.7 10*3/uL — ABNORMAL HIGH (ref 4.0–10.5)

## 2012-09-12 LAB — URINALYSIS, ROUTINE W REFLEX MICROSCOPIC
Glucose, UA: NEGATIVE mg/dL
Ketones, ur: NEGATIVE mg/dL
Nitrite: POSITIVE — AB
Protein, ur: 30 mg/dL — AB
Specific Gravity, Urine: 1.021 (ref 1.005–1.030)
Urobilinogen, UA: 1 mg/dL (ref 0.0–1.0)
pH: 7 (ref 5.0–8.0)

## 2012-09-12 LAB — GLUCOSE, CAPILLARY: Glucose-Capillary: 108 mg/dL — ABNORMAL HIGH (ref 70–99)

## 2012-09-12 LAB — BASIC METABOLIC PANEL
BUN: 22 mg/dL (ref 6–23)
CO2: 25 mEq/L (ref 19–32)
Calcium: 9.2 mg/dL (ref 8.4–10.5)
Chloride: 101 mEq/L (ref 96–112)
Creatinine, Ser: 1.35 mg/dL — ABNORMAL HIGH (ref 0.50–1.10)
GFR calc Af Amer: 45 mL/min — ABNORMAL LOW (ref >90)
GFR calc non Af Amer: 39 mL/min — ABNORMAL LOW (ref >90)
Glucose, Bld: 182 mg/dL — ABNORMAL HIGH (ref 70–99)
Potassium: 3.7 mEq/L (ref 3.5–5.1)
Sodium: 138 mEq/L (ref 135–145)

## 2012-09-12 LAB — URINE MICROSCOPIC-ADD ON

## 2012-09-12 MED ORDER — ASPIRIN EC 81 MG PO TBEC
81.0000 mg | DELAYED_RELEASE_TABLET | Freq: Every evening | ORAL | Status: DC
  Administered 2012-09-12 – 2012-09-14 (×3): 81 mg via ORAL
  Filled 2012-09-12 (×3): qty 1

## 2012-09-12 MED ORDER — ENOXAPARIN SODIUM 60 MG/0.6ML ~~LOC~~ SOLN
50.0000 mg | SUBCUTANEOUS | Status: DC
  Administered 2012-09-12 – 2012-09-13 (×2): 50 mg via SUBCUTANEOUS
  Filled 2012-09-12 (×3): qty 0.6

## 2012-09-12 MED ORDER — GABAPENTIN 400 MG PO CAPS
800.0000 mg | ORAL_CAPSULE | Freq: Three times a day (TID) | ORAL | Status: DC
  Administered 2012-09-12 – 2012-09-14 (×6): 800 mg via ORAL
  Filled 2012-09-12 (×7): qty 2

## 2012-09-12 MED ORDER — SIMVASTATIN 40 MG PO TABS: 40.0000 mg | ORAL_TABLET | Freq: Every day | ORAL | Status: DC

## 2012-09-12 MED ORDER — PRAVASTATIN SODIUM 40 MG PO TABS
80.0000 mg | ORAL_TABLET | Freq: Every day | ORAL | Status: DC
  Administered 2012-09-12 – 2012-09-14 (×3): 80 mg via ORAL
  Filled 2012-09-12 (×3): qty 2

## 2012-09-12 MED ORDER — INSULIN DETEMIR 100 UNIT/ML ~~LOC~~ SOLN
50.0000 [IU] | Freq: Every morning | SUBCUTANEOUS | Status: DC
  Administered 2012-09-13 – 2012-09-14 (×2): 50 [IU] via SUBCUTANEOUS
  Filled 2012-09-12 (×3): qty 0.5

## 2012-09-12 MED ORDER — AMLODIPINE BESYLATE 10 MG PO TABS
10.0000 mg | ORAL_TABLET | Freq: Every morning | ORAL | Status: DC
  Administered 2012-09-13 – 2012-09-14 (×2): 10 mg via ORAL
  Filled 2012-09-12 (×2): qty 1

## 2012-09-12 MED ORDER — LISINOPRIL 20 MG PO TABS
20.0000 mg | ORAL_TABLET | Freq: Every day | ORAL | Status: DC
  Administered 2012-09-13 – 2012-09-14 (×2): 20 mg via ORAL
  Filled 2012-09-12 (×2): qty 1

## 2012-09-12 MED ORDER — NORTRIPTYLINE HCL 25 MG PO CAPS
25.0000 mg | ORAL_CAPSULE | Freq: Every day | ORAL | Status: DC
  Administered 2012-09-12 – 2012-09-13 (×2): 25 mg via ORAL
  Filled 2012-09-12 (×3): qty 1

## 2012-09-12 MED ORDER — SERTRALINE HCL 100 MG PO TABS
100.0000 mg | ORAL_TABLET | Freq: Every morning | ORAL | Status: DC
  Administered 2012-09-13 – 2012-09-14 (×2): 100 mg via ORAL
  Filled 2012-09-12 (×2): qty 1

## 2012-09-12 MED ORDER — SODIUM CHLORIDE 0.9 % IV SOLN: 1.0000 g | INTRAVENOUS | Status: DC

## 2012-09-12 MED ORDER — SODIUM CHLORIDE 0.9 % IV SOLN
INTRAVENOUS | Status: AC
  Administered 2012-09-12: 22:00:00 via INTRAVENOUS

## 2012-09-12 MED ORDER — ONDANSETRON HCL 4 MG PO TABS: 4.0000 mg | ORAL_TABLET | Freq: Four times a day (QID) | ORAL | Status: DC | PRN

## 2012-09-12 MED ORDER — LEVOTHYROXINE SODIUM 137 MCG PO TABS
137.0000 ug | ORAL_TABLET | Freq: Every day | ORAL | Status: DC
  Administered 2012-09-13 – 2012-09-14 (×2): 137 ug via ORAL
  Filled 2012-09-12 (×3): qty 1

## 2012-09-12 MED ORDER — VITAMIN D (ERGOCALCIFEROL) 1.25 MG (50000 UNIT) PO CAPS: 50000.0000 [IU] | ORAL_CAPSULE | ORAL | Status: DC

## 2012-09-12 MED ORDER — ADULT MULTIVITAMIN W/MINERALS CH
1.0000 | ORAL_TABLET | Freq: Every evening | ORAL | Status: DC
  Administered 2012-09-12 – 2012-09-14 (×3): 1 via ORAL
  Filled 2012-09-12 (×3): qty 1

## 2012-09-12 MED ORDER — SODIUM CHLORIDE 0.9 % IV SOLN
1.0000 g | INTRAVENOUS | Status: DC
  Administered 2012-09-12 – 2012-09-13 (×2): 1 g via INTRAVENOUS
  Filled 2012-09-12 (×3): qty 1

## 2012-09-12 MED ORDER — SODIUM CHLORIDE 0.9 % IV BOLUS (SEPSIS)
1000.0000 mL | Freq: Once | INTRAVENOUS | Status: AC
  Administered 2012-09-12: 1000 mL via INTRAVENOUS

## 2012-09-12 MED ORDER — ONDANSETRON HCL 4 MG/2ML IJ SOLN: 4.0000 mg | Freq: Four times a day (QID) | INTRAMUSCULAR | Status: DC | PRN

## 2012-09-12 MED ORDER — CYANOCOBALAMIN 1000 MCG/ML IJ SOLN: 1000.0000 ug | INTRAMUSCULAR | Status: DC

## 2012-09-12 MED ORDER — ACETAMINOPHEN 650 MG RE SUPP: 650.0000 mg | Freq: Four times a day (QID) | RECTAL | Status: DC | PRN

## 2012-09-12 MED ORDER — CARVEDILOL 3.125 MG PO TABS
3.1250 mg | ORAL_TABLET | Freq: Two times a day (BID) | ORAL | Status: DC
  Administered 2012-09-13 – 2012-09-14 (×4): 3.125 mg via ORAL
  Filled 2012-09-12 (×5): qty 1

## 2012-09-12 MED ORDER — HYDROCODONE-ACETAMINOPHEN 10-325 MG PO TABS
1.0000 | ORAL_TABLET | Freq: Four times a day (QID) | ORAL | Status: DC | PRN
  Administered 2012-09-13 – 2012-09-14 (×4): 1 via ORAL
  Filled 2012-09-12 (×4): qty 1

## 2012-09-12 MED ORDER — INSULIN ASPART 100 UNIT/ML ~~LOC~~ SOLN
0.0000 [IU] | Freq: Three times a day (TID) | SUBCUTANEOUS | Status: DC
  Administered 2012-09-13 – 2012-09-14 (×2): 2 [IU] via SUBCUTANEOUS

## 2012-09-12 MED ORDER — INSULIN ASPART 100 UNIT/ML ~~LOC~~ SOLN: 0.0000 [IU] | Freq: Every day | SUBCUTANEOUS | Status: DC

## 2012-09-12 MED ORDER — OXYBUTYNIN CHLORIDE ER 10 MG PO TB24
10.0000 mg | ORAL_TABLET | Freq: Every evening | ORAL | Status: DC
  Administered 2012-09-12 – 2012-09-14 (×3): 10 mg via ORAL
  Filled 2012-09-12 (×3): qty 1

## 2012-09-12 MED ORDER — ENOXAPARIN SODIUM 40 MG/0.4ML ~~LOC~~ SOLN: 40.0000 mg | SUBCUTANEOUS | Status: DC

## 2012-09-12 MED ORDER — ACETAMINOPHEN 325 MG PO TABS: 650.0000 mg | ORAL_TABLET | Freq: Four times a day (QID) | ORAL | Status: DC | PRN

## 2012-09-12 NOTE — ED Provider Notes (Signed)
Medical screening examination/treatment/procedure(s) were conducted as a shared visit with non-physician practitioner(s) and myself.  I personally evaluated the patient during the encounter.  72 year old female has had several months of persistent dysuria with recurrent and resistant urinary tract infections despite multiple antibiotics and most recently had polyuria systems E. coli positive urine culture March.  Hurman Horn, MD 09/14/12 [id number]

## 2012-09-12 NOTE — ED Notes (Signed)
Pt sent over from [place].  Notes from [place] states that she has been having a UTI since January.  Has been on 4 different abx that have not gotten rid of it.  Culture was done in March showing E.coli sensitive to Macorbid and Bactrim as well as other non-po abx.  Pt feels like the infection is back or perhaps never gone.  Denies fever, NVD.  Has chronic back pain but no new pain.  Has been trying to get in with the urologist in [facility] but cannot get an appt for about a month.

## 2012-09-12 NOTE — H&P (Signed)
[facility] History and Physical  LAYONNA DOBIE [name]:[id number] DOB: 08/17/1940 DOA: 09/12/2012  Referring physician: ER physician. PCP: Abigail Floro, MD  Chief Complaint: Persistent dysuria. Weakness.  HPI: Abigail [name] is a 72 y.o. female with history of recurrent UTI since February of this year has been on at least 3-4 courses of antibiotics has been referred to the ER because patient has been having persistent symptoms and for last few days also has been feeling weak. Patient has had a CT abdomen and pelvis done this January which was unremarkable for any hydronephrosis or any acute findings. Patient presently is on Macrobid and as per patient recent urine cultures done showed Escherichia coli the sensitivities of which are not available but as per ER physician was resistant to multiple antibiotics except for Bactrim and imipenem. Since patient also has been having some weakness and this ongoing dysuria and the possibility of recurrent UTI with resistant bacteria ER physician had discussed with on-call infectious disease consult and Dr. Zenaida Niece [name] who had advised to start patient on IV Invanz and get urine cultures. Since patient also looks clinically dehydrated patient has been started on IV fluids. Patient denies otherwise any nausea vomiting or diarrhea has some suprapubic pain. Has mild fever at home with feeling of chills. Denies any chest pain or shortness of breath and was easily treated for sinusitis. Patient was originally referred to urologist for later this month.  Review of Systems: As presented in the history of presenting illness, rest negative.  Past Medical History  Diagnosis Date  . CAD (coronary artery disease)   . Diabetes mellitus   . Obesity   . Fibromyalgia   . Family hx of colon cancer   . Depression   . Hypertension   . Obesity   . GERD (gastroesophageal reflux disease)     pt denies  . Chronic kidney disease     low kidney function; BUN was high  acc to pt  . PONV (postoperative nausea and vomiting)     with stapedectomy   Past Surgical History  Procedure Laterality Date  . Cholecystectomy    . Dilation and curettage of uterus    . Tubal ligation    . Stapedectomy    . Shoulder surgery      bilateral rotator cuff repairs   Social History:  reports that she has never smoked. She has never used smokeless tobacco. She reports that she does not drink alcohol or use illicit drugs. Lives at home. where does patient live-- Can do ADLs. Can patient participate in ADLs?  Allergies  Allergen Reactions  . Onglyza (Saxagliptin)     Made legs and ankles swell  . Polysporin (Bacitracin-Polymyxin B)     Allergic to ointments such as polysporin, neosporin, cortisporin  . Cephalosporins   . Penicillins     Family History  Problem Relation Age of Onset  . Colon cancer Brother   . Diabetes Mother     and Sister   . Uterine cancer Sister       Prior to Admission medications   Medication Sig Start Date End Date Taking? Authorizing Provider  amLODipine (NORVASC) 10 MG tablet Take 10 mg by mouth every morning.    Yes Historical Provider, MD  aspirin EC 81 MG tablet Take 81 mg by mouth every evening.   Yes Historical Provider, MD  carvedilol (COREG) 3.125 MG tablet Take 3.125 mg by mouth 2 (two) times daily with a meal.   Yes Historical Provider,  MD  cyanocobalamin (,VITAMIN B-12,) 1000 MCG/ML injection Inject 1,000 mcg into the muscle every 30 (thirty) days. Every 21 days   Yes Historical Provider, MD  gabapentin (NEURONTIN) 400 MG capsule Take 800 mg by mouth 3 (three) times daily.    Yes Historical Provider, MD  HYDROcodone-acetaminophen (NORCO) 10-325 MG per tablet Take 1 tablet by mouth every 6 (six) hours as needed for pain (back pain).   Yes Historical Provider, MD  insulin detemir (LEVEMIR) 100 UNIT/ML injection Inject 50 Units into the skin every morning. Inject 20 units once a day   Yes Historical Provider, MD  levothyroxine  (SYNTHROID, LEVOTHROID) 137 MCG tablet Take 137 mcg by mouth every morning.    Yes Historical Provider, MD  lisinopril-hydrochlorothiazide (PRINZIDE,ZESTORETIC) 20-12.5 MG per tablet Take 1 tablet by mouth every morning.    Yes Historical Provider, MD  Multiple Vitamin (MULTIVITAMIN) tablet Take 1 tablet by mouth every evening.    Yes Historical Provider, MD  nitrofurantoin, macrocrystal-monohydrate, (MACROBID) 100 MG capsule Take 100 mg by mouth 2 (two) times daily.   Yes Historical Provider, MD  nortriptyline (PAMELOR) 25 MG capsule Take 25 mg by mouth at bedtime.  11/11/10  Yes Historical Provider, MD  oxybutynin (DITROPAN-XL) 10 MG 24 hr tablet Take 10 mg by mouth every evening.   Yes Historical Provider, MD  potassium chloride (K-DUR,KLOR-CON) 10 MEQ tablet Take 10 mEq by mouth every evening.   Yes Historical Provider, MD  pravastatin (PRAVACHOL) 80 MG tablet Take 80 mg by mouth every evening.  12/03/10  Yes Historical Provider, MD  sertraline (ZOLOFT) 100 MG tablet Take 100 mg by mouth every morning.    Yes Historical Provider, MD  Vitamin D, Ergocalciferol, (DRISDOL) 50000 UNITS CAPS Take 50,000 Units by mouth every 30 (thirty) days.  09/27/10  Yes Historical Provider, MD  B Complex-C (B-COMPLEX WITH VITAMIN C) tablet Take 1 tablet by mouth every evening.    Historical Provider, MD   Physical Exam: Filed Vitals:   09/12/12 1502 09/12/12 1914  BP: 126/50 126/60  Pulse: 74 67  Temp: 97.8 F (36.6 C) 98.4 F (36.9 C)  TempSrc: Oral Oral  Resp: 18 18  SpO2: 94% 95%     General:  Well-developed well-nourished.  Eyes: Anicteric no pallor.  ENT: No discharge from the ears eyes nose or mouth.  Neck: No mass felt.  Cardiovascular: S1-S2 heard.  Respiratory: No rhonchi or crepitations.  Abdomen: Soft nontender bowel sounds present.  Skin: No rash.  Musculoskeletal: No edema.  Psychiatric: Appears normal.  Neurologic: Alert awake oriented to time place and person. Moves all  extremities.  Labs on Admission:  Basic Metabolic Panel:  Recent Labs Lab 09/12/12 1545  NA 138  K 3.7  CL 101  CO2 25  GLUCOSE 182*  BUN 22  CREATININE 1.35*  CALCIUM 9.2   Liver Function Tests: No results found for this basename: AST, ALT, ALKPHOS, BILITOT, PROT, ALBUMIN,  in the last 168 hours No results found for this basename: LIPASE, AMYLASE,  in the last 168 hours No results found for this basename: AMMONIA,  in the last 168 hours CBC:  Recent Labs Lab 09/12/12 1545  WBC 12.7*  NEUTROABS 9.7*  HGB 10.5*  HCT 32.1*  MCV 91.7  PLT 269   Cardiac Enzymes: No results found for this basename: CKTOTAL, CKMB, CKMBINDEX, TROPONINI,  in the last 168 hours  BNP (last 3 results) No results found for this basename: PROBNP,  in the last 8760 hours CBG:  No results found for this basename: GLUCAP,  in the last 168 hours  Radiological Exams on Admission: No results found.   Assessment/Plan Principal Problem:   Recurrent UTI Active Problems:   Dehydration   Diabetes mellitus   Hypertension   Hypothyroidism   Hyperlipidemia   1. Recurrent UTI - patient has been started on IV Invanz as advised by infectious disease consult Dr. Zenaida Niece [name]. Follow urine cultures. 2. Dehydration - hold HCTZ and gently hydrate. Follow metabolic panel and intake output. 3. Hypertension - continue present medications except for HCTZ.  4. Diabetes mellitus type 2 - continue home medications. 5. Hypothyroidism - continue home medications. 6. Hyperlipidemia - continue home medications.    Code Status: Full code.  Family Communication: Patient's husband at the bedside.  Disposition Plan: Admit to inpatient.    Clarkson Rosselli N. [facility] Pager [phone].  If 7PM-7AM, please contact night-coverage [email or web address] Password [facility] 09/12/2012, 8:06 PM

## 2012-09-12 NOTE — Progress Notes (Signed)
Pt confirms pcp is  [facility] EPIC updated

## 2012-09-13 DIAGNOSIS — I10 Essential (primary) hypertension: Secondary | ICD-10-CM

## 2012-09-13 DIAGNOSIS — N390 Urinary tract infection, site not specified: Secondary | ICD-10-CM | POA: Diagnosis not present

## 2012-09-13 DIAGNOSIS — E876 Hypokalemia: Secondary | ICD-10-CM

## 2012-09-13 DIAGNOSIS — E119 Type 2 diabetes mellitus without complications: Secondary | ICD-10-CM | POA: Diagnosis not present

## 2012-09-13 DIAGNOSIS — E860 Dehydration: Secondary | ICD-10-CM | POA: Diagnosis not present

## 2012-09-13 LAB — BASIC METABOLIC PANEL
BUN: 18 mg/dL (ref 6–23)
CO2: 26 mEq/L (ref 19–32)
Calcium: 9.1 mg/dL (ref 8.4–10.5)
Chloride: 105 mEq/L (ref 96–112)
Creatinine, Ser: 1.23 mg/dL — ABNORMAL HIGH (ref 0.50–1.10)
GFR calc Af Amer: 50 mL/min — ABNORMAL LOW (ref >90)
GFR calc non Af Amer: 43 mL/min — ABNORMAL LOW (ref >90)
Glucose, Bld: 102 mg/dL — ABNORMAL HIGH (ref 70–99)
Potassium: 3.3 mEq/L — ABNORMAL LOW (ref 3.5–5.1)
Sodium: 140 mEq/L (ref 135–145)

## 2012-09-13 LAB — GLUCOSE, CAPILLARY
Glucose-Capillary: 103 mg/dL — ABNORMAL HIGH (ref 70–99)
Glucose-Capillary: 154 mg/dL — ABNORMAL HIGH (ref 70–99)
Glucose-Capillary: 80 mg/dL (ref 70–99)
Glucose-Capillary: 87 mg/dL (ref 70–99)

## 2012-09-13 LAB — CBC
HCT: 30.1 % — ABNORMAL LOW (ref 36.0–46.0)
Hemoglobin: 10.1 g/dL — ABNORMAL LOW (ref 12.0–15.0)
MCH: 30.5 pg (ref 26.0–34.0)
MCHC: 33.6 g/dL (ref 30.0–36.0)
MCV: 90.9 fL (ref 78.0–100.0)
Platelets: 215 10*3/uL (ref 150–400)
RBC: 3.31 MIL/uL — ABNORMAL LOW (ref 3.87–5.11)
RDW: 13.6 % (ref 11.5–15.5)
WBC: 8.6 10*3/uL (ref 4.0–10.5)

## 2012-09-13 LAB — HEMOGLOBIN A1C
Hgb A1c MFr Bld: 6.2 % — ABNORMAL HIGH (ref <5.7)
Mean Plasma Glucose: 131 mg/dL — ABNORMAL HIGH (ref <117)

## 2012-09-13 MED ORDER — FLUCONAZOLE 150 MG PO TABS
150.0000 mg | ORAL_TABLET | Freq: Once | ORAL | Status: AC
  Administered 2012-09-13: 150 mg via ORAL
  Filled 2012-09-13: qty 1

## 2012-09-13 MED ORDER — DOCUSATE SODIUM 100 MG PO CAPS
100.0000 mg | ORAL_CAPSULE | Freq: Two times a day (BID) | ORAL | Status: DC
  Administered 2012-09-13 – 2012-09-14 (×2): 100 mg via ORAL
  Filled 2012-09-13 (×3): qty 1

## 2012-09-13 MED ORDER — POTASSIUM CHLORIDE CRYS ER 20 MEQ PO TBCR
40.0000 meq | EXTENDED_RELEASE_TABLET | ORAL | Status: AC
  Administered 2012-09-13 (×2): 40 meq via ORAL
  Filled 2012-09-13 (×2): qty 2

## 2012-09-13 NOTE — Consult Note (Signed)
Urology Consult   Physician requesting consult: Viyuoh  Reason for consult: Possible recurrent UTIs  History of Present Illness: Abigail [name] is a 72 y.o. female with PMH significant for CAD, DM, obesity, HTN, and obesity who was admitted yesterday for tx of UTI.  She presented with approx 6 days of suprapubic discomfort, dysuria, and malaise.  She was noted to have a LGF.  She had been given a script for Macrobid by her PCP last Friday and her sx progressed in severity until yesterday when her PCP instructed her to present to the ED.  ER physician had access to PCP records and noted that a recent urine cultrue revealed E Coli sensitive only to Bactrim and imipenem.  Secondary to this ID was consulted and the pt was started on Invanz.  In ED she was noted to have slightly elevated Cr thought to be due to dehydration.    Pt states that since Jan 2014 she has had recurrent UTIs that have been evaled and tx by her PCP.  Each episode presents with the same sx including dysuria, mild suprapubic discomfort, and increased UUI.  She denies any hx of increased frequency, hematuria, back pain, flank pain, F/C, and N/V.  Per her report since this began in Jan she has been treated with two rounds of Cipro, two rounds of Macrobid, and one round of Bactrim all of which were 7 day courses.  Her sx would improve while on Abx and then recur within a week after cessation.  She had a CT A/P without contrast in Jan which was WNL.  She states that her UA at the time of CT was positive for microhematuria.  Her UA this admission is also positive for MH.    She denies a history hematuria, STDs, urolithiasis, and GU malignancy/trauma/surgery.  She does have a hx of MUI (mostly urge) and uses 2ppd.  She was diagnosed with DM over 20 years ago and thinks it has been well controlled recently.  Past Medical History  Diagnosis Date  . CAD (coronary artery disease)   . Diabetes mellitus   . Obesity   . Fibromyalgia   .  Family hx of colon cancer   . Depression   . Hypertension   . Obesity   . GERD (gastroesophageal reflux disease)     pt denies  . Chronic kidney disease     low kidney function; BUN was high acc to pt  . PONV (postoperative nausea and vomiting)     with stapedectomy    Past Surgical History  Procedure Laterality Date  . Cholecystectomy    . Dilation and curettage of uterus    . Tubal ligation    . Stapedectomy    . Shoulder surgery      bilateral rotator cuff repairs     Current Hospital Medications:  Home meds:    Medication List    ASK your doctor about these medications       amLODipine 10 MG tablet  Commonly known as:  NORVASC  Take 10 mg by mouth every morning.     aspirin EC 81 MG tablet  Take 81 mg by mouth every evening.     B-complex with vitamin C tablet  Take 1 tablet by mouth every evening.     carvedilol 3.125 MG tablet  Commonly known as:  COREG  Take 3.125 mg by mouth 2 (two) times daily with a meal.     gabapentin 400 MG capsule  Commonly known  as:  NEURONTIN  Take 800 mg by mouth 3 (three) times daily.     HYDROcodone-acetaminophen 10-325 MG per tablet  Commonly known as:  NORCO  Take 1 tablet by mouth every 6 (six) hours as needed for pain (back pain).     insulin detemir 100 UNIT/ML injection  Commonly known as:  LEVEMIR  Inject 50 Units into the skin every morning.     levothyroxine 137 MCG tablet  Commonly known as:  SYNTHROID, LEVOTHROID  Take 137 mcg by mouth every morning.     lisinopril-hydrochlorothiazide 20-12.5 MG per tablet  Commonly known as:  PRINZIDE,ZESTORETIC  Take 1 tablet by mouth every morning.     multivitamin tablet  Take 1 tablet by mouth every evening.     nitrofurantoin (macrocrystal-monohydrate) 100 MG capsule  Commonly known as:  MACROBID  Take 100 mg by mouth 2 (two) times daily.     nortriptyline 25 MG capsule  Commonly known as:  PAMELOR  Take 25 mg by mouth at bedtime.     oxybutynin 10 MG 24  hr tablet  Commonly known as:  DITROPAN-XL  Take 10 mg by mouth every evening.     potassium chloride 10 MEQ tablet  Commonly known as:  K-DUR,KLOR-CON  Take 10 mEq by mouth every evening.     pravastatin 80 MG tablet  Commonly known as:  PRAVACHOL  Take 80 mg by mouth every evening.     sertraline 100 MG tablet  Commonly known as:  ZOLOFT  Take 100 mg by mouth every morning.     VITAMIN B-12 IJ  Inject 1,000 mg as directed every 30 (thirty) days.     Vitamin D (Ergocalciferol) 50000 UNITS Caps  Commonly known as:  DRISDOL  Take 50,000 Units by mouth every 30 (thirty) days.        Scheduled Meds: . amLODipine  10 mg Oral q morning - 10a  . aspirin EC  81 mg Oral QPM  . carvedilol  3.125 mg Oral BID WC  . [START ON 09/30/2012] cyanocobalamin  1,000 mcg Intramuscular Q30 days  . enoxaparin (LOVENOX) injection  50 mg Subcutaneous Q24H  . ertapenem  1 g Intravenous Q24H  . gabapentin  800 mg Oral TID  . insulin aspart  0-5 Units Subcutaneous QHS  . insulin aspart  0-9 Units Subcutaneous TID WC  . insulin detemir  50 Units Subcutaneous q morning - 10a  . levothyroxine  137 mcg Oral QAC breakfast  . lisinopril  20 mg Oral Daily  . multivitamin with minerals  1 tablet Oral QPM  . nortriptyline  25 mg Oral QHS  . oxybutynin  10 mg Oral QPM  . potassium chloride  40 mEq Oral Q4H  . pravastatin  80 mg Oral q1800  . sertraline  100 mg Oral q morning - 10a  . [START ON 10/11/2012] Vitamin D (Ergocalciferol)  50,000 Units Oral Q30 days   Continuous Infusions: . sodium chloride 75 mL/hr at 09/12/12 2221   PRN Meds:.acetaminophen, acetaminophen, HYDROcodone-acetaminophen, ondansetron (ZOFRAN) IV, ondansetron  Allergies:  Allergies  Allergen Reactions  . Onglyza (Saxagliptin)     Made legs and ankles swell  . Polysporin (Bacitracin-Polymyxin B)     Allergic to ointments such as polysporin, neosporin, cortisporin  . Cephalosporins   . Penicillins     Family History   Problem Relation Age of Onset  . Colon cancer Brother   . Diabetes Mother     and Sister   . Uterine  cancer Sister     Social History:  reports that she has never smoked. She has never used smokeless tobacco. She reports that she does not drink alcohol or use illicit drugs.  ROS: A complete review of systems was performed.  All systems are negative except for pertinent findings as noted.  Physical Exam:  Vital signs in last 24 hours: Temp:  [97.7 F (36.5 C)-98.4 F (36.9 C)] 98.3 F (36.8 C) (04/17 0616) Pulse Rate:  [61-74] 61 (04/17 0616) Resp:  [18] 18 (04/17 0616) BP: (117-127)/(50-70) 117/62 mmHg (04/17 0616) SpO2:  [93 %-95 %] 95 % (04/17 0616) Weight:  [107.049 kg (236 lb)-107.23 kg (236 lb 6.4 oz)] 107.23 kg (236 lb 6.4 oz) (04/16 2217) General:  Alert and oriented, No acute distress HEENT: Normocephalic, atraumatic Neck: No JVD or lymphadenopathy Cardiovascular: Regular rate and rhythm Lungs: Clear bilaterally Abdomen: Soft, nontender, nondistended, no abdominal masses Back: No CVA tenderness Extremities: No edema Neurologic: Grossly intact  Laboratory Data:   Recent Labs  09/12/12 1545 09/13/12 0515  WBC 12.7* 8.6  HGB 10.5* 10.1*  HCT 32.1* 30.1*  PLT 269 215     Recent Labs  09/12/12 1545 09/13/12 0515  NA 138 140  K 3.7 3.3*  CL 101 105  GLUCOSE 182* 102*  BUN 22 18  CALCIUM 9.2 9.1  CREATININE 1.35* 1.23*     Results for orders placed during the hospital encounter of 09/12/12 (from the past 24 hour(s))  CBC WITH DIFFERENTIAL     Status: Abnormal   Collection Time    09/12/12  3:45 PM      Result Value Range   WBC 12.7 (*) 4.0 - 10.5 K/uL   RBC 3.50 (*) 3.87 - 5.11 MIL/uL   Hemoglobin 10.5 (*) 12.0 - 15.0 g/dL   HCT 78.2 (*) 95.6 - 21.3 %   MCV 91.7  78.0 - 100.0 fL   MCH 30.0  26.0 - 34.0 pg   MCHC 32.7  30.0 - 36.0 g/dL   RDW 08.6  57.8 - 46.9 %   Platelets 269  150 - 400 K/uL   Neutrophils Relative 77  43 - 77 %   Neutro  Abs 9.7 (*) 1.7 - 7.7 K/uL   Lymphocytes Relative 15  12 - 46 %   Lymphs Abs 1.9  0.7 - 4.0 K/uL   Monocytes Relative 5  3 - 12 %   Monocytes Absolute 0.6  0.1 - 1.0 K/uL   Eosinophils Relative 3  0 - 5 %   Eosinophils Absolute 0.4  0.0 - 0.7 K/uL   Basophils Relative 1  0 - 1 %   Basophils Absolute 0.1  0.0 - 0.1 K/uL  BASIC METABOLIC PANEL     Status: Abnormal   Collection Time    09/12/12  3:45 PM      Result Value Range   Sodium 138  135 - 145 mEq/L   Potassium 3.7  3.5 - 5.1 mEq/L   Chloride 101  96 - 112 mEq/L   CO2 25  19 - 32 mEq/L   Glucose, Bld 182 (*) 70 - 99 mg/dL   BUN 22  6 - 23 mg/dL   Creatinine, Ser 6.29 (*) 0.50 - 1.10 mg/dL   Calcium 9.2  8.4 - 52.8 mg/dL   GFR calc non Af Amer 39 (*) >90 mL/min   GFR calc Af Amer 45 (*) >90 mL/min  URINALYSIS, ROUTINE W REFLEX MICROSCOPIC     Status: Abnormal  Collection Time    09/12/12  4:11 PM      Result Value Range   Color, Urine ORANGE (*) YELLOW   APPearance TURBID (*) CLEAR   Specific Gravity, Urine 1.021  1.005 - 1.030   pH 7.0  5.0 - 8.0   Glucose, UA NEGATIVE  NEGATIVE mg/dL   Hgb urine dipstick SMALL (*) NEGATIVE   Bilirubin Urine SMALL (*) NEGATIVE   Ketones, ur NEGATIVE  NEGATIVE mg/dL   Protein, ur 30 (*) NEGATIVE mg/dL   Urobilinogen, UA 1.0  0.0 - 1.0 mg/dL   Nitrite POSITIVE (*) NEGATIVE   Leukocytes, UA LARGE (*) NEGATIVE  URINE MICROSCOPIC-ADD ON     Status: Abnormal   Collection Time    09/12/12  4:11 PM      Result Value Range   Squamous Epithelial / LPF FEW (*) RARE   WBC, UA TOO NUMEROUS TO COUNT  <3 WBC/hpf   RBC / HPF 11-20  <3 RBC/hpf   Bacteria, UA MANY (*) RARE  GLUCOSE, CAPILLARY     Status: Abnormal   Collection Time    09/12/12 10:12 PM      Result Value Range   Glucose-Capillary 108 (*) 70 - 99 mg/dL   Comment 1 Notify RN    BASIC METABOLIC PANEL     Status: Abnormal   Collection Time    09/13/12  5:15 AM      Result Value Range   Sodium 140  135 - 145 mEq/L   Potassium  3.3 (*) 3.5 - 5.1 mEq/L   Chloride 105  96 - 112 mEq/L   CO2 26  19 - 32 mEq/L   Glucose, Bld 102 (*) 70 - 99 mg/dL   BUN 18  6 - 23 mg/dL   Creatinine, Ser 1.61 (*) 0.50 - 1.10 mg/dL   Calcium 9.1  8.4 - 09.6 mg/dL   GFR calc non Af Amer 43 (*) >90 mL/min   GFR calc Af Amer 50 (*) >90 mL/min  CBC     Status: Abnormal   Collection Time    09/13/12  5:15 AM      Result Value Range   WBC 8.6  4.0 - 10.5 K/uL   RBC 3.31 (*) 3.87 - 5.11 MIL/uL   Hemoglobin 10.1 (*) 12.0 - 15.0 g/dL   HCT 04.5 (*) 40.9 - 81.1 %   MCV 90.9  78.0 - 100.0 fL   MCH 30.5  26.0 - 34.0 pg   MCHC 33.6  30.0 - 36.0 g/dL   RDW 91.4  78.2 - 95.6 %   Platelets 215  150 - 400 K/uL  HEMOGLOBIN A1C     Status: Abnormal   Collection Time    09/13/12  5:15 AM      Result Value Range   Hemoglobin A1C 6.2 (*) <5.7 %   Mean Plasma Glucose 131 (*) <117 mg/dL  GLUCOSE, CAPILLARY     Status: Abnormal   Collection Time    09/13/12  7:22 AM      Result Value Range   Glucose-Capillary 103 (*) 70 - 99 mg/dL  GLUCOSE, CAPILLARY     Status: Abnormal   Collection Time    09/13/12 11:21 AM      Result Value Range   Glucose-Capillary 154 (*) 70 - 99 mg/dL   No results found for this or any previous visit (from the past 240 hour(s)).  Renal Function:  Recent Labs  09/12/12 1545 09/13/12 0515  CREATININE 1.35*  1.23*   Estimated Creatinine Clearance: 49.9 ml/min (by C-G formula based on Cr of 1.23).  Radiologic Imaging: No results found.   Impression/Assessment:  Possible recurrent UTIs since Jan with sx including dysuria and suprapubic discomfort as well MH on UA  Plan:  F/u urine culture.  Complete Abx course as outlined by ID  Will check PVR to eval if pt is emptying bladder with each void.  She is diabetic and this must be considered in pt with current issues  Pt will need outpt eval with CT A/P with and without contrast as well as cysto to eval for causes of sx/possible UTIs/hematuria.  Pending review of  all previous information she may also need urodynamics.   She already had an appt est with Dr. Mena Goes on 09/26/12  Abigail [name]. 09/13/2012, 1:40 PM   I have reviewed the labs, films and records and discussed the findings with the patient.    Her urine culture is pending.    I would recommend maintaining a suppressive antibiotic after completion of therapy and based on her prior culture either bactrim or trimethoprim nightly would be my choice.  She has f/u with Dr. Mena Goes on 4/30 and could have cystoscopy then.   I would hold off on a CT with contrast for the microhematuria until we see whether it will persist following treatment of the infection.  The non-contrast CT was negative and I think the yield on a contrast CT would be low at this time.  We will continue to follow.

## 2012-09-13 NOTE — Progress Notes (Signed)
Patient voided 300cc. Bladder scan completed as ordered and revealed no residual urine in bladder.

## 2012-09-13 NOTE — Progress Notes (Signed)
[facility] PROGRESS NOTE  Abigail [name] GEX:[id number] DOB: [birth year] DOA: 09/12/2012 PCP: Daisy Floro, MD  Assessment/Plan: 1.Recurrent UTI - continue  IV Invanz as per infectious disease/Dr. Zenaida Niece dam's recs. -I have consulted urology as patient states she she has been trying to get in to see a urologist for a good while and so wants to see one before discharge. - Follow urine cultures. 2.Dehydration  -continue holding HCTZ, continue hydration. 3.Hypertension  - continue present medications except for HCTZ.  4.Diabetes mellitus type 2  - continue home medications. 5.Hypothyroidism  - continue home medications. 6.Hyperlipidemia  - continue home medications. 7.Hypokalemia -replace k    Code Status: full Family Communication: directly with pt at bedside Disposition Plan: to home when medically satble   Consultants:  ID- per ED  Urology  Procedures:  none  Antibiotics:  INVANZ started on 4/16  HPI/Subjective: Decreased dysuria, some nausea, denies vomiting.  Objective: Filed Vitals:   09/12/12 2100 09/12/12 2217 09/12/12 2218 09/13/12 0616  BP:   127/70 117/62  Pulse:   62 61  Temp:   97.7 F (36.5 C) 98.3 F (36.8 C)  TempSrc:    Oral  Resp:   18 18  Height: 5\' 3"  (1.6 m) 5\' 3"  (1.6 m)    Weight: 107.049 kg (236 lb) 107.23 kg (236 lb 6.4 oz)    SpO2:   93% 95%    Intake/Output Summary (Last 24 hours) at 09/13/12 0954 Last data filed at 09/13/12 0617  Gross per 24 hour  Intake    600 ml  Output   1200 ml  Net   -600 ml   Filed Weights   09/12/12 2100 09/12/12 2217  Weight: 107.049 kg (236 lb) 107.23 kg (236 lb 6.4 oz)    Exam:   General:  Alert and oriented x3, in no apparent distress  Cardiovascular: Regular rate and rhythm, normal S1-S2  Respiratory: Clear to auscultation bilaterally, no crackles or wheezes  Abdomen: Soft, bowel sounds present nondistended, mild suprapubic tenderness, no rebound tenderness. No masses  palpable  Extremities: No cyanosis and no edema  Data Reviewed: Basic Metabolic Panel:  Recent Labs Lab 09/12/12 1545 09/13/12 0515  NA 138 140  K 3.7 3.3*  CL 101 105  CO2 25 26  GLUCOSE 182* 102*  BUN 22 18  CREATININE 1.35* 1.23*  CALCIUM 9.2 9.1   Liver Function Tests: No results found for this basename: AST, ALT, ALKPHOS, BILITOT, PROT, ALBUMIN,  in the last 168 hours No results found for this basename: LIPASE, AMYLASE,  in the last 168 hours No results found for this basename: AMMONIA,  in the last 168 hours CBC:  Recent Labs Lab 09/12/12 1545 09/13/12 0515  WBC 12.7* 8.6  NEUTROABS 9.7*  --   HGB 10.5* 10.1*  HCT 32.1* 30.1*  MCV 91.7 90.9  PLT 269 215   Cardiac Enzymes: No results found for this basename: CKTOTAL, CKMB, CKMBINDEX, TROPONINI,  in the last 168 hours BNP (last 3 results) No results found for this basename: PROBNP,  in the last 8760 hours CBG:  Recent Labs Lab 09/12/12 2212 09/13/12 0722  GLUCAP 108* 103*    No results found for this or any previous visit (from the past 240 hour(s)).   Studies: No results found.  Scheduled Meds: . amLODipine  10 mg Oral q morning - 10a  . aspirin EC  81 mg Oral QPM  . carvedilol  3.125 mg Oral BID WC  . [START ON 09/30/2012]  cyanocobalamin  1,000 mcg Intramuscular Q30 days  . enoxaparin (LOVENOX) injection  50 mg Subcutaneous Q24H  . ertapenem  1 g Intravenous Q24H  . gabapentin  800 mg Oral TID  . insulin aspart  0-5 Units Subcutaneous QHS  . insulin aspart  0-9 Units Subcutaneous TID WC  . insulin detemir  50 Units Subcutaneous q morning - 10a  . levothyroxine  137 mcg Oral QAC breakfast  . lisinopril  20 mg Oral Daily  . multivitamin with minerals  1 tablet Oral QPM  . nortriptyline  25 mg Oral QHS  . oxybutynin  10 mg Oral QPM  . pravastatin  80 mg Oral q1800  . sertraline  100 mg Oral q morning - 10a  . [START ON 10/11/2012] Vitamin D (Ergocalciferol)  50,000 Units Oral Q30 days    Continuous Infusions: . sodium chloride 75 mL/hr at 09/12/12 2221    Principal Problem:   Recurrent UTI Active Problems:   Dehydration   Diabetes mellitus   Hypertension   Hypothyroidism   Hyperlipidemia    Time spent: 53    Lawsen Arnott C  [facility] Pager [phone]. If 7PM-7AM, please contact night-coverage at [email or web address], password [facility] 09/13/2012, 9:54 AM  LOS: 1 day

## 2012-09-13 NOTE — Progress Notes (Signed)
Utilization review completed.  

## 2012-09-14 DIAGNOSIS — N390 Urinary tract infection, site not specified: Secondary | ICD-10-CM | POA: Diagnosis not present

## 2012-09-14 DIAGNOSIS — I10 Essential (primary) hypertension: Secondary | ICD-10-CM | POA: Diagnosis not present

## 2012-09-14 DIAGNOSIS — E039 Hypothyroidism, unspecified: Secondary | ICD-10-CM | POA: Diagnosis not present

## 2012-09-14 LAB — URINE CULTURE: Colony Count: 80000

## 2012-09-14 LAB — BASIC METABOLIC PANEL
BUN: 16 mg/dL (ref 6–23)
CO2: 27 mEq/L (ref 19–32)
Calcium: 9 mg/dL (ref 8.4–10.5)
Chloride: 106 mEq/L (ref 96–112)
Creatinine, Ser: 1.16 mg/dL — ABNORMAL HIGH (ref 0.50–1.10)
GFR calc Af Amer: 54 mL/min — ABNORMAL LOW (ref >90)
GFR calc non Af Amer: 47 mL/min — ABNORMAL LOW (ref >90)
Glucose, Bld: 92 mg/dL (ref 70–99)
Potassium: 4 mEq/L (ref 3.5–5.1)
Sodium: 141 mEq/L (ref 135–145)

## 2012-09-14 LAB — GLUCOSE, CAPILLARY
Glucose-Capillary: 102 mg/dL — ABNORMAL HIGH (ref 70–99)
Glucose-Capillary: 152 mg/dL — ABNORMAL HIGH (ref 70–99)
Glucose-Capillary: 93 mg/dL (ref 70–99)

## 2012-09-14 MED ORDER — SULFAMETHOXAZOLE-TRIMETHOPRIM 800-160 MG PO TABS: 1.0000 | ORAL_TABLET | Freq: Every day | ORAL | Status: DC

## 2012-09-14 MED ORDER — CIPROFLOXACIN HCL 500 MG PO TABS: 500.0000 mg | ORAL_TABLET | Freq: Two times a day (BID) | ORAL | Status: DC

## 2012-09-14 MED ORDER — POLYETHYLENE GLYCOL 3350 17 G PO PACK
17.0000 g | PACK | ORAL | Status: AC
  Administered 2012-09-14: 17 g via ORAL
  Filled 2012-09-14: qty 1

## 2012-09-14 MED ORDER — CIPROFLOXACIN HCL 500 MG PO TABS
500.0000 mg | ORAL_TABLET | Freq: Once | ORAL | Status: AC
  Administered 2012-09-14: 500 mg via ORAL
  Filled 2012-09-14: qty 1

## 2012-09-14 NOTE — Discharge Summary (Signed)
Physician Discharge Summary  Abigail [name] GEX:[id number] DOB: [birth year] DOA: 09/12/2012  PCP: Daisy Floro, MD  Admit date: 09/12/2012 Discharge date: 09/14/2012  Time spent: >26minutes  Recommendations for Outpatient Follow-up:      Follow-up Information   Follow up with Antony Haste, MD. (on 4/30 as scheduled)    Contact information:   [address] [place] [address] [phone]       Follow up with Daisy Floro, MD. (in 1-2weeks, call for appt upon discharge)    Contact information:   [address] [place] [address] [phone]        Discharge Diagnoses:  Principal Problem:   Recurrent UTI Active Problems:   Dehydration   Diabetes mellitus   Hypertension   Hypothyroidism   Hyperlipidemia   Discharge Condition: Improved/stable  Diet recommendation: Modified carbohydrate  Filed Weights   09/12/12 2100 09/12/12 2217 09/14/12 0548  Weight: 107.049 kg (236 lb) 107.23 kg (236 lb 6.4 oz) 108.41 kg (239 lb)    History of present illness:  JAYDN FINCHER is a 72 y.o. female with history of recurrent UTI since February of this year has been on at least 3-4 courses of antibiotics has been referred to the ER because patient has been having persistent symptoms and for last few days also has been feeling weak. Patient has had a CT abdomen and pelvis done this January which was unremarkable for any hydronephrosis or any acute findings. Patient presently is on Macrobid and as per patient recent urine cultures done showed Escherichia coli the sensitivities of which are not available but as per ER physician was resistant to multiple antibiotics except for Bactrim and imipenem. Since patient also has been having some weakness and this ongoing dysuria and the possibility of recurrent UTI with resistant bacteria ER physician had discussed with on-call infectious disease consult and Dr. Zenaida Niece dam who had advised to start patient on  IV Invanz and get urine cultures. Since patient also looks clinically dehydrated patient has been started on IV fluids. Patient denies otherwise any nausea vomiting or diarrhea has some suprapubic pain. Has mild fever at home with feeling of chills. Denies any chest pain or shortness of breath and was easily treated for sinusitis. Patient was originally referred to urologist for later this month.   Hospital Course:  1.Recurrent UTI  - Upon admission urine cultures were obtained and EDP consulted ID via phone for antibiotic recommendations and patient was started on  IV Invanz as per infectious disease/Dr. Zenaida Niece dam's recs.  -Patient are requested urology auscultation in how as she stated she she had been trying to get in to see a urologist for a good while and so wanted to see one before discharge.  -Dr. Annabell Howells saw patient recommended continuing antibiotics as per infectious disease recommendations, stated that she would need further evaluation but that this would best be done outpatient and noted that she has an appointment scheduled to see Dr. Mena Goes on 4/30. Dr. Thad Ranger recommended suppressive antibiotics with Bactrim or trimethoprim after patient are completed the full course of antibiotics for treatment. -Her urine cultures came back today positive for Pseudomonas that is pansensitive-a discussed this findings with Dr. Algis Liming and he recommends to treat patient with Cipro for one week. - She has remained afebrile and hemodynamically stable her leukocytosis is resolved-WBC 8.6 prior to discharge. 2.Dehydration  -Resolved with hydration and holding HCTZ 3.Hypertension  -She is to continue outpatient medications upon discharge 4.Diabetes mellitus type  2  - continue home medications.  5.Hypothyroidism  - continue home medications.  6.Hyperlipidemia  - continue home medications.  7.Hypokalemia  Her potassium was replaced during this hospital  stay.   Procedures:  none  Consultations:  Urology-Dr. Annabell Howells  Infectious disease-phone consultation  Discharge Exam: Filed Vitals:   09/13/12 1421 09/13/12 2105 09/14/12 0548 09/14/12 1455  BP: 122/53 132/72 135/70 137/70  Pulse: 66 61 59 67  Temp: 98.1 F (36.7 C) 98.7 F (37.1 C) 98.2 F (36.8 C) 97.9 F (36.6 C)  TempSrc: Oral Oral Oral Oral  Resp: 18 18 18 16   Height:      Weight:   108.41 kg (239 lb)   SpO2: 95% 99% 94% 96%     Discharge Instructions  Discharge Orders   Future Orders Complete By Expires     Diet Carb Modified  As directed     Increase activity slowly  As directed         Medication List    STOP taking these medications       nitrofurantoin (macrocrystal-monohydrate) 100 MG capsule  Commonly known as:  MACROBID      TAKE these medications       amLODipine 10 MG tablet  Commonly known as:  NORVASC  Take 10 mg by mouth every morning.     aspirin EC 81 MG tablet  Take 81 mg by mouth every evening.     B-complex with vitamin C tablet  Take 1 tablet by mouth every evening.     carvedilol 3.125 MG tablet  Commonly known as:  COREG  Take 3.125 mg by mouth 2 (two) times daily with a meal.     ciprofloxacin 500 MG tablet  Commonly known as:  CIPRO  Take 1 tablet (500 mg total) by mouth 2 (two) times daily.     gabapentin 400 MG capsule  Commonly known as:  NEURONTIN  Take 800 mg by mouth 3 (three) times daily.     HYDROcodone-acetaminophen 10-325 MG per tablet  Commonly known as:  NORCO  Take 1 tablet by mouth every 6 (six) hours as needed for pain (back pain).     insulin detemir 100 UNIT/ML injection  Commonly known as:  LEVEMIR  Inject 50 Units into the skin every morning.     levothyroxine 137 MCG tablet  Commonly known as:  SYNTHROID, LEVOTHROID  Take 137 mcg by mouth every morning.     lisinopril-hydrochlorothiazide 20-12.5 MG per tablet  Commonly known as:  PRINZIDE,ZESTORETIC  Take 1 tablet by mouth every  morning.     multivitamin tablet  Take 1 tablet by mouth every evening.     nortriptyline 25 MG capsule  Commonly known as:  PAMELOR  Take 25 mg by mouth at bedtime.     oxybutynin 10 MG 24 hr tablet  Commonly known as:  DITROPAN-XL  Take 10 mg by mouth every evening.     potassium chloride 10 MEQ tablet  Commonly known as:  K-DUR,KLOR-CON  Take 10 mEq by mouth every evening.     pravastatin 80 MG tablet  Commonly known as:  PRAVACHOL  Take 80 mg by mouth every evening.     sertraline 100 MG tablet  Commonly known as:  ZOLOFT  Take 100 mg by mouth every morning.     sulfamethoxazole-trimethoprim 800-160 MG per tablet  Commonly known as:  BACTRIM DS  Take 1 tablet by mouth at bedtime.     VITAMIN B-12 IJ  Inject  1,000 mg as directed every 30 (thirty) days.     Vitamin D (Ergocalciferol) 50000 UNITS Caps  Commonly known as:  DRISDOL  Take 50,000 Units by mouth every 30 (thirty) days.           Follow-up Information   Follow up with Antony Haste, MD. (on 4/30 as scheduled)    Contact information:   [address] [place] [address] [phone]       Follow up with Daisy Floro, MD. (in 1-2weeks, call for appt upon discharge)    Contact information:   [address] [place] [address] [phone]        The results of significant diagnostics from this hospitalization (including imaging, microbiology, ancillary and laboratory) are listed below for reference.    Significant Diagnostic Studies: Ct Head Wo Contrast  08/22/2012  *RADIOLOGY REPORT*  Clinical Data:  Fall with injury to the left side of the face. Headache.  Bruising.  CT HEAD WITHOUT CONTRAST CT MAXILLOFACIAL WITHOUT CONTRAST  Technique:  Multidetector CT imaging of the head and maxillofacial structures were performed using the standard protocol without intravenous contrast. Multiplanar CT image reconstructions of the maxillofacial structures were also  generated.  Comparison:  04/03/2012  CT HEAD  Findings: The brain stem, cerebellum, cerebral peduncles, thalami, basal ganglia, basilar cisterns, and ventricular system appear unremarkable.  No intracranial hemorrhage, mass lesion, or acute infarction is identified.  Minimal chronic right sphenoid sinusitis noted.  IMPRESSION: 1.  No acute intracranial findings.  2.  Minimal acute right sphenoid sinusitis.  CT MAXILLOFACIAL  Findings:   Mild chronic left frontal and right sphenoid sinusitis noted.  The maxillary sinuses and ethmoid air cells clear. Zygomatic arches and pterygoid plates intact.  No facial fracture observed.  Loss of disc height noted at C3-4 as on prior exams. Subcutaneous edema/bruising noted along the left cheek region.  IMPRESSION:  1.  Subcutaneous bruising along the left face.  No underlying fracture observed. 2.  Cervical degenerative disc disease at C3-4. 3.  Minimal chronic left frontal and right sphenoid sinusitis.   Original Report Authenticated By: Gaylyn Rong, M.D.    Ct Maxillofacial Wo Cm  08/22/2012  *RADIOLOGY REPORT*  Clinical Data:  Fall with injury to the left side of the face. Headache.  Bruising.  CT HEAD WITHOUT CONTRAST CT MAXILLOFACIAL WITHOUT CONTRAST  Technique:  Multidetector CT imaging of the head and maxillofacial structures were performed using the standard protocol without intravenous contrast. Multiplanar CT image reconstructions of the maxillofacial structures were also generated.  Comparison:  04/03/2012  CT HEAD  Findings: The brain stem, cerebellum, cerebral peduncles, thalami, basal ganglia, basilar cisterns, and ventricular system appear unremarkable.  No intracranial hemorrhage, mass lesion, or acute infarction is identified.  Minimal chronic right sphenoid sinusitis noted.  IMPRESSION: 1.  No acute intracranial findings.  2.  Minimal acute right sphenoid sinusitis.  CT MAXILLOFACIAL  Findings:   Mild chronic left frontal and right sphenoid sinusitis  noted.  The maxillary sinuses and ethmoid air cells clear. Zygomatic arches and pterygoid plates intact.  No facial fracture observed.  Loss of disc height noted at C3-4 as on prior exams. Subcutaneous edema/bruising noted along the left cheek region.  IMPRESSION:  1.  Subcutaneous bruising along the left face.  No underlying fracture observed. 2.  Cervical degenerative disc disease at C3-4. 3.  Minimal chronic left frontal and right sphenoid sinusitis.   Original Report Authenticated By: Gaylyn Rong, M.D.  Microbiology: Recent Results (from the past 240 hour(s))  URINE CULTURE     Status: None   Collection Time    09/12/12  4:11 PM      Result Value Range Status   Specimen Description URINE, CLEAN CATCH   Final   Special Requests NONE   Final   Culture  Setup Time 09/12/2012 22:05   Final   Colony Count 80,000 COLONIES/ML   Final   Culture PSEUDOMONAS AERUGINOSA   Final   Report Status 09/14/2012 FINAL   Final   Organism ID, Bacteria PSEUDOMONAS AERUGINOSA   Final     Labs: Basic Metabolic Panel:  Recent Labs Lab 09/12/12 1545 09/13/12 0515 09/14/12 0445  NA 138 140 141  K 3.7 3.3* 4.0  CL 101 105 106  CO2 25 26 27   GLUCOSE 182* 102* 92  BUN 22 18 16   CREATININE 1.35* 1.23* 1.16*  CALCIUM 9.2 9.1 9.0   Liver Function Tests: No results found for this basename: AST, ALT, ALKPHOS, BILITOT, PROT, ALBUMIN,  in the last 168 hours No results found for this basename: LIPASE, AMYLASE,  in the last 168 hours No results found for this basename: AMMONIA,  in the last 168 hours CBC:  Recent Labs Lab 09/12/12 1545 09/13/12 0515  WBC 12.7* 8.6  NEUTROABS 9.7*  --   HGB 10.5* 10.1*  HCT 32.1* 30.1*  MCV 91.7 90.9  PLT 269 215   Cardiac Enzymes: No results found for this basename: CKTOTAL, CKMB, CKMBINDEX, TROPONINI,  in the last 168 hours BNP: BNP (last 3 results) No results found for this basename: PROBNP,  in the last 8760 hours CBG:  Recent Labs Lab  09/13/12 1704 09/13/12 2039 09/14/12 0736 09/14/12 1154 09/14/12 1718  GLUCAP 80 87 102* 152* 93       Signed:  Ausha Sieh C  [facility] 09/14/2012, 5:46 PM

## 2012-09-14 NOTE — Progress Notes (Signed)
Patient ID: Abigail [name], female   DOB: 1940-12-29, 72 y.o.   MRN: [id number] Afebrile.  V/S stable. Has frequency, no dysuria.   Urine culture: pending. Continue antibiotics.  Discharge home on Trimethoprim or Bactrim when ready for discharge. Follow-up with Dr Mena Goes

## 2012-09-17 ENCOUNTER — Encounter: Payer: Self-pay | Admitting: Physician Assistant

## 2012-09-26 DIAGNOSIS — N390 Urinary tract infection, site not specified: Secondary | ICD-10-CM | POA: Diagnosis not present

## 2012-09-27 ENCOUNTER — Ambulatory Visit: Payer: Medicare Other | Attending: Family Medicine | Admitting: Rehabilitation

## 2012-09-27 DIAGNOSIS — IMO0001 Reserved for inherently not codable concepts without codable children: Secondary | ICD-10-CM | POA: Diagnosis not present

## 2012-09-27 DIAGNOSIS — M6281 Muscle weakness (generalized): Secondary | ICD-10-CM | POA: Insufficient documentation

## 2012-09-27 DIAGNOSIS — R262 Difficulty in walking, not elsewhere classified: Secondary | ICD-10-CM | POA: Insufficient documentation

## 2012-09-30 NOTE — ED Provider Notes (Signed)
History     CSN: [id number]  Arrival date & time 09/12/12  1447   First MD Initiated Contact with Patient 09/12/12 1505      Chief Complaint  Patient presents with  . Urinary Tract Infection    (Consider location/radiation/quality/duration/timing/severity/associated sxs/prior treatment) HPI Patient presents to the emergency department with several month history of recurrent UTI.  Patient, states, that she's had several rounds of varying antibiotics.  Patient, states, that she had a recent culture that showed it was resistant to several antibiotics.  Patient, states, that she's not been able to eat and drink very well.  Patient denies abdominal pain, chest pain, shortness of breath, fever, dizziness, syncope, back pain, weakness, rash or headache.  Patient, states, that she's not taken any other medications for her symptoms.  Patient, states she was sent over by her primary care Dr. for possible admission. Past Medical History  Diagnosis Date  . CAD (coronary artery disease)   . Diabetes mellitus   . Obesity   . Fibromyalgia   . Family hx of colon cancer   . Depression   . Hypertension   . Obesity   . GERD (gastroesophageal reflux disease)     pt denies  . Chronic kidney disease     low kidney function; BUN was high acc to pt  . PONV (postoperative nausea and vomiting)     with stapedectomy    Past Surgical History  Procedure Laterality Date  . Cholecystectomy    . Dilation and curettage of uterus    . Tubal ligation    . Stapedectomy    . Shoulder surgery      bilateral rotator cuff repairs    Family History  Problem Relation Age of Onset  . Colon cancer Brother   . Diabetes Mother     and Sister   . Uterine cancer Sister     History  Substance Use Topics  . Smoking status: Never Smoker   . Smokeless tobacco: Never Used  . Alcohol Use: No    OB History   Grav Para Term Preterm Abortions TAB SAB Ect Mult Living                  Review of Systems All  other systems negative except as documented in the HPI. All pertinent positives and negatives as reviewed in the HPI.  Allergies  Onglyza; Polysporin; Cephalosporins; and Penicillins  Home Medications   Current Outpatient Rx  Name  Route  Sig  Dispense  Refill  . amLODipine (NORVASC) 10 MG tablet   Oral   Take 10 mg by mouth every morning.          Marland Kitchen aspirin EC 81 MG tablet   Oral   Take 81 mg by mouth every evening.         . carvedilol (COREG) 3.125 MG tablet   Oral   Take 3.125 mg by mouth 2 (two) times daily with a meal.         . Cyanocobalamin (VITAMIN B-12 IJ)   Injection   Inject 1,000 mg as directed every 30 (thirty) days.         Marland Kitchen gabapentin (NEURONTIN) 400 MG capsule   Oral   Take 800 mg by mouth 3 (three) times daily.          Marland Kitchen HYDROcodone-acetaminophen (NORCO) 10-325 MG per tablet   Oral   Take 1 tablet by mouth every 6 (six) hours as needed for pain (back pain).         Marland Kitchen  insulin detemir (LEVEMIR) 100 UNIT/ML injection   Subcutaneous   Inject 50 Units into the skin every morning.         Marland Kitchen levothyroxine (SYNTHROID, LEVOTHROID) 137 MCG tablet   Oral   Take 137 mcg by mouth every morning.          Marland Kitchen lisinopril-hydrochlorothiazide (PRINZIDE,ZESTORETIC) 20-12.5 MG per tablet   Oral   Take 1 tablet by mouth every morning.          . Multiple Vitamin (MULTIVITAMIN) tablet   Oral   Take 1 tablet by mouth every evening.          . nortriptyline (PAMELOR) 25 MG capsule   Oral   Take 25 mg by mouth at bedtime.          Marland Kitchen oxybutynin (DITROPAN-XL) 10 MG 24 hr tablet   Oral   Take 10 mg by mouth every evening.         . potassium chloride (K-DUR,KLOR-CON) 10 MEQ tablet   Oral   Take 10 mEq by mouth every evening.         . pravastatin (PRAVACHOL) 80 MG tablet   Oral   Take 80 mg by mouth every evening.          . sertraline (ZOLOFT) 100 MG tablet   Oral   Take 100 mg by mouth every morning.          . Vitamin D,  Ergocalciferol, (DRISDOL) 50000 UNITS CAPS   Oral   Take 50,000 Units by mouth every 30 (thirty) days.          . B Complex-C (B-COMPLEX WITH VITAMIN C) tablet   Oral   Take 1 tablet by mouth every evening.         . ciprofloxacin (CIPRO) 500 MG tablet   Oral   Take 1 tablet (500 mg total) by mouth 2 (two) times daily.   14 tablet   0     For 7days   . sulfamethoxazole-trimethoprim (BACTRIM DS) 800-160 MG per tablet   Oral   Take 1 tablet by mouth at bedtime.   30 tablet   0     Start only after completing the 7days of cipro.     BP 137/70  Pulse 67  Temp(Src) 97.9 F (36.6 C) (Oral)  Resp 16  Ht 5\' 3"  (1.6 m)  Wt 239 lb (108.41 kg)  BMI 42.35 kg/m2  SpO2 96%  Physical Exam  Nursing note and vitals reviewed. Constitutional: She is oriented to person, place, and time. She appears well-developed and well-nourished. No distress.  HENT:  Head: Normocephalic and atraumatic.  Mouth/Throat: Uvula is midline. Mucous membranes are dry. No oropharyngeal exudate, posterior oropharyngeal edema or posterior oropharyngeal erythema.  Eyes: Pupils are equal, round, and reactive to light.  Neck: Normal range of motion. Neck supple.  Cardiovascular: Normal rate, regular rhythm and normal heart sounds.  Exam reveals no gallop and no friction rub.   No murmur heard. Pulmonary/Chest: Effort normal and breath sounds normal. No respiratory distress.  Abdominal: Soft. Bowel sounds are normal. She exhibits no distension. There is no tenderness. There is no guarding.  Neurological: She is alert and oriented to person, place, and time.  Skin: Skin is warm and dry. No rash noted.    ED Course  Procedures (including critical care time)  Labs Reviewed  URINALYSIS, ROUTINE W REFLEX MICROSCOPIC - Abnormal; Notable for the following:    Color, Urine ORANGE (*)  APPearance TURBID (*)    Hgb urine dipstick SMALL (*)    Bilirubin Urine SMALL (*)    Protein, ur 30 (*)    Nitrite  POSITIVE (*)    Leukocytes, UA LARGE (*)    All other components within normal limits  CBC WITH DIFFERENTIAL - Abnormal; Notable for the following:    WBC 12.7 (*)    RBC 3.50 (*)    Hemoglobin 10.5 (*)    HCT 32.1 (*)    Neutro Abs 9.7 (*)    All other components within normal limits  BASIC METABOLIC PANEL - Abnormal; Notable for the following:    Glucose, Bld 182 (*)    Creatinine, Ser 1.35 (*)    GFR calc non Af Amer 39 (*)    GFR calc Af Amer 45 (*)    All other components within normal limits  URINE MICROSCOPIC-ADD ON - Abnormal; Notable for the following:    Squamous Epithelial / LPF FEW (*)    Bacteria, UA MANY (*)    All other components within normal limits  BASIC METABOLIC PANEL - Abnormal; Notable for the following:    Potassium 3.3 (*)    Glucose, Bld 102 (*)    Creatinine, Ser 1.23 (*)    GFR calc non Af Amer 43 (*)    GFR calc Af Amer 50 (*)    All other components within normal limits  CBC - Abnormal; Notable for the following:    RBC 3.31 (*)    Hemoglobin 10.1 (*)    HCT 30.1 (*)    All other components within normal limits  HEMOGLOBIN A1C - Abnormal; Notable for the following:    Hemoglobin A1C 6.2 (*)    Mean Plasma Glucose 131 (*)    All other components within normal limits  GLUCOSE, CAPILLARY - Abnormal; Notable for the following:    Glucose-Capillary 108 (*)    All other components within normal limits  GLUCOSE, CAPILLARY - Abnormal; Notable for the following:    Glucose-Capillary 103 (*)    All other components within normal limits  GLUCOSE, CAPILLARY - Abnormal; Notable for the following:    Glucose-Capillary 154 (*)    All other components within normal limits  BASIC METABOLIC PANEL - Abnormal; Notable for the following:    Creatinine, Ser 1.16 (*)    GFR calc non Af Amer 47 (*)    GFR calc Af Amer 54 (*)    All other components within normal limits  GLUCOSE, CAPILLARY - Abnormal; Notable for the following:    Glucose-Capillary 102 (*)     All other components within normal limits  GLUCOSE, CAPILLARY - Abnormal; Notable for the following:    Glucose-Capillary 152 (*)    All other components within normal limits  URINE CULTURE  GLUCOSE, CAPILLARY  GLUCOSE, CAPILLARY  GLUCOSE, CAPILLARY   No results found.   1. Complicated UTI (urinary tract infection)   2. Dehydration   3. Diabetes mellitus   4. Hypothyroidism   5. Recurrent UTI   6. Hypertension   7. Hypokalemia   8. Hyperlipidemia   9. Shortness of breath   10. Nonspecific abnormal finding in stool contents   11. Family history of malignant neoplasm of gastrointestinal tract   12. Coronary artery disease   13. Diabetes mellitus without mention of complication    Patient be admitted to the hospital for dehydration, and complicated UTI.  Spoke with the, [facility], who will be down to see the  patient for admission.  Patient has remained stable here in the emergency department.  IV antibiotics were also initiated  MDM  MDM Reviewed: vitals, nursing note and previous chart Interpretation: labs Consults: admitting MD            Carlyle Dolly, PA-C 09/30/12 [phone]

## 2012-10-02 DIAGNOSIS — G894 Chronic pain syndrome: Secondary | ICD-10-CM | POA: Diagnosis not present

## 2012-10-02 DIAGNOSIS — M5137 Other intervertebral disc degeneration, lumbosacral region: Secondary | ICD-10-CM | POA: Diagnosis not present

## 2012-10-02 DIAGNOSIS — M461 Sacroiliitis, not elsewhere classified: Secondary | ICD-10-CM | POA: Diagnosis not present

## 2012-10-02 DIAGNOSIS — M5030 Other cervical disc degeneration, unspecified cervical region: Secondary | ICD-10-CM | POA: Diagnosis not present

## 2012-10-10 ENCOUNTER — Ambulatory Visit: Payer: Medicare Other | Admitting: Rehabilitation

## 2012-10-18 DIAGNOSIS — R059 Cough, unspecified: Secondary | ICD-10-CM | POA: Diagnosis not present

## 2012-10-18 DIAGNOSIS — I10 Essential (primary) hypertension: Secondary | ICD-10-CM | POA: Diagnosis not present

## 2012-10-18 DIAGNOSIS — R05 Cough: Secondary | ICD-10-CM | POA: Diagnosis not present

## 2012-10-23 ENCOUNTER — Ambulatory Visit: Payer: Medicare Other | Admitting: Rehabilitation

## 2012-10-25 ENCOUNTER — Ambulatory Visit: Payer: Medicare Other | Admitting: Rehabilitation

## 2012-10-31 ENCOUNTER — Ambulatory Visit: Payer: Medicare Other | Admitting: Rehabilitation

## 2012-11-02 ENCOUNTER — Ambulatory Visit: Payer: Medicare Other | Admitting: Rehabilitation

## 2012-11-06 ENCOUNTER — Ambulatory Visit: Payer: Medicare Other | Admitting: Rehabilitation

## 2012-11-08 ENCOUNTER — Ambulatory Visit: Payer: Medicare Other | Admitting: Rehabilitation

## 2012-11-09 DIAGNOSIS — IMO0001 Reserved for inherently not codable concepts without codable children: Secondary | ICD-10-CM | POA: Diagnosis not present

## 2012-11-09 DIAGNOSIS — IMO0002 Reserved for concepts with insufficient information to code with codable children: Secondary | ICD-10-CM | POA: Diagnosis not present

## 2012-11-09 DIAGNOSIS — M79609 Pain in unspecified limb: Secondary | ICD-10-CM | POA: Diagnosis not present

## 2012-11-09 DIAGNOSIS — M159 Polyosteoarthritis, unspecified: Secondary | ICD-10-CM | POA: Diagnosis not present

## 2012-11-26 DIAGNOSIS — R3915 Urgency of urination: Secondary | ICD-10-CM | POA: Diagnosis not present

## 2012-11-26 DIAGNOSIS — N3941 Urge incontinence: Secondary | ICD-10-CM | POA: Diagnosis not present

## 2012-11-26 DIAGNOSIS — N390 Urinary tract infection, site not specified: Secondary | ICD-10-CM | POA: Diagnosis not present

## 2012-11-27 DIAGNOSIS — Z79899 Other long term (current) drug therapy: Secondary | ICD-10-CM | POA: Diagnosis not present

## 2012-11-27 DIAGNOSIS — G5790 Unspecified mononeuropathy of unspecified lower limb: Secondary | ICD-10-CM | POA: Diagnosis not present

## 2012-11-27 DIAGNOSIS — G894 Chronic pain syndrome: Secondary | ICD-10-CM | POA: Diagnosis not present

## 2012-12-06 DIAGNOSIS — D510 Vitamin B12 deficiency anemia due to intrinsic factor deficiency: Secondary | ICD-10-CM | POA: Diagnosis not present

## 2012-12-06 DIAGNOSIS — E1149 Type 2 diabetes mellitus with other diabetic neurological complication: Secondary | ICD-10-CM | POA: Diagnosis not present

## 2012-12-06 DIAGNOSIS — E1142 Type 2 diabetes mellitus with diabetic polyneuropathy: Secondary | ICD-10-CM | POA: Diagnosis not present

## 2012-12-06 DIAGNOSIS — E669 Obesity, unspecified: Secondary | ICD-10-CM | POA: Diagnosis not present

## 2012-12-06 DIAGNOSIS — N189 Chronic kidney disease, unspecified: Secondary | ICD-10-CM | POA: Diagnosis not present

## 2012-12-06 DIAGNOSIS — E039 Hypothyroidism, unspecified: Secondary | ICD-10-CM | POA: Diagnosis not present

## 2012-12-24 DIAGNOSIS — R609 Edema, unspecified: Secondary | ICD-10-CM | POA: Diagnosis not present

## 2012-12-24 DIAGNOSIS — S0003XA Contusion of scalp, initial encounter: Secondary | ICD-10-CM | POA: Diagnosis not present

## 2012-12-24 DIAGNOSIS — S1093XA Contusion of unspecified part of neck, initial encounter: Secondary | ICD-10-CM | POA: Diagnosis not present

## 2013-01-02 ENCOUNTER — Other Ambulatory Visit: Payer: Self-pay

## 2013-01-12 DIAGNOSIS — K120 Recurrent oral aphthae: Secondary | ICD-10-CM | POA: Diagnosis not present

## 2013-01-12 DIAGNOSIS — B370 Candidal stomatitis: Secondary | ICD-10-CM | POA: Diagnosis not present

## 2013-01-24 DIAGNOSIS — B370 Candidal stomatitis: Secondary | ICD-10-CM | POA: Diagnosis not present

## 2013-01-31 DIAGNOSIS — H2510 Age-related nuclear cataract, unspecified eye: Secondary | ICD-10-CM | POA: Diagnosis not present

## 2013-02-04 DIAGNOSIS — M5030 Other cervical disc degeneration, unspecified cervical region: Secondary | ICD-10-CM | POA: Diagnosis not present

## 2013-02-04 DIAGNOSIS — Z79899 Other long term (current) drug therapy: Secondary | ICD-10-CM | POA: Diagnosis not present

## 2013-02-04 DIAGNOSIS — G5600 Carpal tunnel syndrome, unspecified upper limb: Secondary | ICD-10-CM | POA: Diagnosis not present

## 2013-02-04 DIAGNOSIS — G894 Chronic pain syndrome: Secondary | ICD-10-CM | POA: Diagnosis not present

## 2013-02-05 ENCOUNTER — Inpatient Hospital Stay (HOSPITAL_COMMUNITY)
Admission: EM | Admit: 2013-02-05 | Discharge: 2013-02-06 | DRG: 071 | Disposition: A | Discharge status: Home Health | Payer: Medicare Other | Attending: Internal Medicine | Admitting: Internal Medicine

## 2013-02-05 ENCOUNTER — Emergency Department (HOSPITAL_COMMUNITY): Payer: Medicare Other

## 2013-02-05 ENCOUNTER — Encounter (HOSPITAL_COMMUNITY): Payer: Self-pay | Admitting: Emergency Medicine

## 2013-02-05 DIAGNOSIS — D649 Anemia, unspecified: Secondary | ICD-10-CM

## 2013-02-05 DIAGNOSIS — IMO0001 Reserved for inherently not codable concepts without codable children: Secondary | ICD-10-CM | POA: Diagnosis present

## 2013-02-05 DIAGNOSIS — N189 Chronic kidney disease, unspecified: Secondary | ICD-10-CM | POA: Diagnosis present

## 2013-02-05 DIAGNOSIS — N289 Disorder of kidney and ureter, unspecified: Secondary | ICD-10-CM | POA: Diagnosis not present

## 2013-02-05 DIAGNOSIS — F3289 Other specified depressive episodes: Secondary | ICD-10-CM | POA: Diagnosis present

## 2013-02-05 DIAGNOSIS — E860 Dehydration: Secondary | ICD-10-CM | POA: Diagnosis not present

## 2013-02-05 DIAGNOSIS — R410 Disorientation, unspecified: Secondary | ICD-10-CM

## 2013-02-05 DIAGNOSIS — E1142 Type 2 diabetes mellitus with diabetic polyneuropathy: Secondary | ICD-10-CM | POA: Diagnosis present

## 2013-02-05 DIAGNOSIS — R262 Difficulty in walking, not elsewhere classified: Secondary | ICD-10-CM

## 2013-02-05 DIAGNOSIS — K219 Gastro-esophageal reflux disease without esophagitis: Secondary | ICD-10-CM | POA: Diagnosis present

## 2013-02-05 DIAGNOSIS — F329 Major depressive disorder, single episode, unspecified: Secondary | ICD-10-CM | POA: Diagnosis present

## 2013-02-05 DIAGNOSIS — N179 Acute kidney failure, unspecified: Secondary | ICD-10-CM | POA: Diagnosis not present

## 2013-02-05 DIAGNOSIS — E1149 Type 2 diabetes mellitus with other diabetic neurological complication: Secondary | ICD-10-CM | POA: Diagnosis present

## 2013-02-05 DIAGNOSIS — G9340 Encephalopathy, unspecified: Secondary | ICD-10-CM | POA: Diagnosis not present

## 2013-02-05 DIAGNOSIS — E039 Hypothyroidism, unspecified: Secondary | ICD-10-CM | POA: Diagnosis present

## 2013-02-05 DIAGNOSIS — R404 Transient alteration of awareness: Secondary | ICD-10-CM | POA: Diagnosis not present

## 2013-02-05 DIAGNOSIS — I2510 Atherosclerotic heart disease of native coronary artery without angina pectoris: Secondary | ICD-10-CM | POA: Diagnosis present

## 2013-02-05 DIAGNOSIS — I10 Essential (primary) hypertension: Secondary | ICD-10-CM | POA: Diagnosis present

## 2013-02-05 DIAGNOSIS — F09 Unspecified mental disorder due to known physiological condition: Secondary | ICD-10-CM | POA: Diagnosis present

## 2013-02-05 DIAGNOSIS — I129 Hypertensive chronic kidney disease with stage 1 through stage 4 chronic kidney disease, or unspecified chronic kidney disease: Secondary | ICD-10-CM | POA: Diagnosis present

## 2013-02-05 DIAGNOSIS — E119 Type 2 diabetes mellitus without complications: Secondary | ICD-10-CM

## 2013-02-05 DIAGNOSIS — E785 Hyperlipidemia, unspecified: Secondary | ICD-10-CM | POA: Diagnosis present

## 2013-02-05 DIAGNOSIS — Z880 Allergy status to penicillin: Secondary | ICD-10-CM

## 2013-02-05 DIAGNOSIS — F29 Unspecified psychosis not due to a substance or known physiological condition: Secondary | ICD-10-CM | POA: Diagnosis not present

## 2013-02-05 LAB — COMPREHENSIVE METABOLIC PANEL
ALT: 19 U/L (ref 0–35)
AST: 24 U/L (ref 0–37)
Albumin: 3.6 g/dL (ref 3.5–5.2)
Alkaline Phosphatase: 131 U/L — ABNORMAL HIGH (ref 39–117)
BUN: 24 mg/dL — ABNORMAL HIGH (ref 6–23)
CO2: 26 mEq/L (ref 19–32)
Calcium: 10.1 mg/dL (ref 8.4–10.5)
Chloride: 99 mEq/L (ref 96–112)
Creatinine, Ser: 1.99 mg/dL — ABNORMAL HIGH (ref 0.50–1.10)
GFR calc Af Amer: 28 mL/min — ABNORMAL LOW (ref >90)
GFR calc non Af Amer: 24 mL/min — ABNORMAL LOW (ref >90)
Glucose, Bld: 216 mg/dL — ABNORMAL HIGH (ref 70–99)
Potassium: 4.5 mEq/L (ref 3.5–5.1)
Sodium: 134 mEq/L — ABNORMAL LOW (ref 135–145)
Total Bilirubin: 0.3 mg/dL (ref 0.3–1.2)
Total Protein: 6.7 g/dL (ref 6.0–8.3)

## 2013-02-05 LAB — CBC WITH DIFFERENTIAL/PLATELET
Basophils Absolute: 0.1 10*3/uL (ref 0.0–0.1)
Basophils Relative: 1 % (ref 0–1)
Eosinophils Absolute: 0.5 10*3/uL (ref 0.0–0.7)
Eosinophils Relative: 6 % — ABNORMAL HIGH (ref 0–5)
HCT: 35.2 % — ABNORMAL LOW (ref 36.0–46.0)
Hemoglobin: 11.7 g/dL — ABNORMAL LOW (ref 12.0–15.0)
Lymphocytes Relative: 29 % (ref 12–46)
Lymphs Abs: 2.2 10*3/uL (ref 0.7–4.0)
MCH: 29.5 pg (ref 26.0–34.0)
MCHC: 33.2 g/dL (ref 30.0–36.0)
MCV: 88.7 fL (ref 78.0–100.0)
Monocytes Absolute: 0.4 10*3/uL (ref 0.1–1.0)
Monocytes Relative: 5 % (ref 3–12)
Neutro Abs: 4.5 10*3/uL (ref 1.7–7.7)
Neutrophils Relative %: 59 % (ref 43–77)
Platelets: 315 10*3/uL (ref 150–400)
RBC: 3.97 MIL/uL (ref 3.87–5.11)
RDW: 14.1 % (ref 11.5–15.5)
WBC: 7.6 10*3/uL (ref 4.0–10.5)

## 2013-02-05 LAB — PROTIME-INR
INR: 1.01 (ref 0.00–1.49)
Prothrombin Time: 13.1 seconds (ref 11.6–15.2)

## 2013-02-05 LAB — HEMOGLOBIN A1C
Hgb A1c MFr Bld: 7.6 % — ABNORMAL HIGH (ref <5.7)
Mean Plasma Glucose: 171 mg/dL — ABNORMAL HIGH (ref <117)

## 2013-02-05 LAB — MAGNESIUM: Magnesium: 2.3 mg/dL (ref 1.5–2.5)

## 2013-02-05 LAB — AMMONIA: Ammonia: 17 umol/L (ref 11–60)

## 2013-02-05 LAB — PHOSPHORUS: Phosphorus: 4.6 mg/dL (ref 2.3–4.6)

## 2013-02-05 LAB — APTT: aPTT: 30 seconds (ref 24–37)

## 2013-02-05 MED ORDER — SODIUM CHLORIDE 0.9 % IV SOLN: 1000.0000 mL | INTRAVENOUS | Status: DC

## 2013-02-05 MED ORDER — POTASSIUM CHLORIDE CRYS ER 10 MEQ PO TBCR
10.0000 meq | EXTENDED_RELEASE_TABLET | Freq: Every evening | ORAL | Status: DC
  Administered 2013-02-05: 10 meq via ORAL
  Filled 2013-02-05 (×2): qty 1

## 2013-02-05 MED ORDER — CARVEDILOL 6.25 MG PO TABS
6.2500 mg | ORAL_TABLET | Freq: Two times a day (BID) | ORAL | Status: DC
  Administered 2013-02-05 – 2013-02-06 (×2): 6.25 mg via ORAL
  Filled 2013-02-05 (×4): qty 1

## 2013-02-05 MED ORDER — SIMVASTATIN 40 MG PO TABS
40.0000 mg | ORAL_TABLET | Freq: Every day | ORAL | Status: DC
  Administered 2013-02-05: 40 mg via ORAL
  Filled 2013-02-05 (×2): qty 1

## 2013-02-05 MED ORDER — OXYBUTYNIN CHLORIDE ER 10 MG PO TB24
10.0000 mg | ORAL_TABLET | Freq: Every evening | ORAL | Status: DC
  Administered 2013-02-05: 10 mg via ORAL
  Filled 2013-02-05 (×2): qty 1

## 2013-02-05 MED ORDER — ZOLPIDEM TARTRATE 5 MG PO TABS: 10.0000 mg | ORAL_TABLET | Freq: Every evening | ORAL | Status: DC | PRN

## 2013-02-05 MED ORDER — ADULT MULTIVITAMIN W/MINERALS CH
1.0000 | ORAL_TABLET | Freq: Every evening | ORAL | Status: DC
  Administered 2013-02-05: 1 via ORAL
  Filled 2013-02-05 (×2): qty 1

## 2013-02-05 MED ORDER — MOMETASONE FUROATE 0.1 % EX CREA
1.0000 "application " | TOPICAL_CREAM | Freq: Every day | CUTANEOUS | Status: DC | PRN
  Filled 2013-02-05: qty 15

## 2013-02-05 MED ORDER — SODIUM CHLORIDE 0.9 % IV SOLN: 1000.0000 mL | Freq: Once | INTRAVENOUS | Status: DC

## 2013-02-05 MED ORDER — TRIMETHOPRIM 100 MG PO TABS
100.0000 mg | ORAL_TABLET | Freq: Every day | ORAL | Status: DC
  Administered 2013-02-05 – 2013-02-06 (×2): 100 mg via ORAL
  Filled 2013-02-05 (×2): qty 1

## 2013-02-05 MED ORDER — SODIUM CHLORIDE 0.9 % IV BOLUS (SEPSIS)
1000.0000 mL | Freq: Once | INTRAVENOUS | Status: AC
  Administered 2013-02-05: 1000 mL via INTRAVENOUS

## 2013-02-05 MED ORDER — B COMPLEX-C PO TABS
1.0000 | ORAL_TABLET | Freq: Every evening | ORAL | Status: DC
  Administered 2013-02-05: 1 via ORAL
  Filled 2013-02-05 (×2): qty 1

## 2013-02-05 MED ORDER — ACETAMINOPHEN 650 MG RE SUPP: 650.0000 mg | Freq: Four times a day (QID) | RECTAL | Status: DC | PRN

## 2013-02-05 MED ORDER — INSULIN DETEMIR 100 UNIT/ML ~~LOC~~ SOLN
64.0000 [IU] | Freq: Every day | SUBCUTANEOUS | Status: DC
  Administered 2013-02-06: 64 [IU] via SUBCUTANEOUS
  Filled 2013-02-05: qty 0.64

## 2013-02-05 MED ORDER — ONDANSETRON HCL 4 MG PO TABS: 4.0000 mg | ORAL_TABLET | Freq: Four times a day (QID) | ORAL | Status: DC | PRN

## 2013-02-05 MED ORDER — ACETAMINOPHEN 325 MG PO TABS: 650.0000 mg | ORAL_TABLET | Freq: Four times a day (QID) | ORAL | Status: DC | PRN

## 2013-02-05 MED ORDER — FLUTICASONE PROPIONATE 50 MCG/ACT NA SUSP
2.0000 | Freq: Every day | NASAL | Status: DC
  Administered 2013-02-06: 2 via NASAL
  Filled 2013-02-05: qty 16

## 2013-02-05 MED ORDER — HYDROCODONE-ACETAMINOPHEN 10-325 MG PO TABS
1.0000 | ORAL_TABLET | Freq: Four times a day (QID) | ORAL | Status: DC | PRN
  Administered 2013-02-05 – 2013-02-06 (×2): 1 via ORAL
  Filled 2013-02-05 (×2): qty 1

## 2013-02-05 MED ORDER — AMLODIPINE BESYLATE 5 MG PO TABS
5.0000 mg | ORAL_TABLET | Freq: Every morning | ORAL | Status: DC
  Administered 2013-02-06: 5 mg via ORAL
  Filled 2013-02-05: qty 1

## 2013-02-05 MED ORDER — MORPHINE SULFATE 2 MG/ML IJ SOLN: 1.0000 mg | INTRAMUSCULAR | Status: DC | PRN

## 2013-02-05 MED ORDER — ZOLPIDEM TARTRATE 5 MG PO TABS: 5.0000 mg | ORAL_TABLET | Freq: Every evening | ORAL | Status: DC | PRN

## 2013-02-05 MED ORDER — INFLUENZA VAC SPLIT QUAD 0.5 ML IM SUSP
0.5000 mL | INTRAMUSCULAR | Status: AC
  Administered 2013-02-06: 0.5 mL via INTRAMUSCULAR
  Filled 2013-02-05 (×2): qty 0.5

## 2013-02-05 MED ORDER — LEVOTHYROXINE SODIUM 137 MCG PO TABS
137.0000 ug | ORAL_TABLET | Freq: Every day | ORAL | Status: DC
  Administered 2013-02-06: 137 ug via ORAL
  Filled 2013-02-05 (×2): qty 1

## 2013-02-05 MED ORDER — ASPIRIN EC 81 MG PO TBEC
81.0000 mg | DELAYED_RELEASE_TABLET | Freq: Every evening | ORAL | Status: DC
  Administered 2013-02-05: 81 mg via ORAL
  Filled 2013-02-05 (×2): qty 1

## 2013-02-05 MED ORDER — GABAPENTIN 400 MG PO CAPS
800.0000 mg | ORAL_CAPSULE | Freq: Three times a day (TID) | ORAL | Status: DC
  Administered 2013-02-05 (×2): 800 mg via ORAL
  Filled 2013-02-05 (×5): qty 2

## 2013-02-05 MED ORDER — SERTRALINE HCL 100 MG PO TABS
100.0000 mg | ORAL_TABLET | Freq: Every morning | ORAL | Status: DC
  Administered 2013-02-06: 100 mg via ORAL
  Filled 2013-02-05: qty 1

## 2013-02-05 MED ORDER — VITAMIN D (ERGOCALCIFEROL) 1.25 MG (50000 UNIT) PO CAPS: 50000.0000 [IU] | ORAL_CAPSULE | ORAL | Status: DC

## 2013-02-05 MED ORDER — CRANBERRY 405 MG PO CAPS: 1.0000 | ORAL_CAPSULE | Freq: Every day | ORAL | Status: DC

## 2013-02-05 MED ORDER — SODIUM CHLORIDE 0.9 % IV SOLN
INTRAVENOUS | Status: DC
  Administered 2013-02-06: 06:00:00 via INTRAVENOUS

## 2013-02-05 MED ORDER — ONDANSETRON HCL 4 MG/2ML IJ SOLN: 4.0000 mg | Freq: Four times a day (QID) | INTRAMUSCULAR | Status: DC | PRN

## 2013-02-05 MED ORDER — NORTRIPTYLINE HCL 25 MG PO CAPS
75.0000 mg | ORAL_CAPSULE | Freq: Every day | ORAL | Status: DC
  Administered 2013-02-05: 75 mg via ORAL
  Filled 2013-02-05 (×2): qty 3

## 2013-02-05 NOTE — H&P (Addendum)
[facility] History and Physical  Abigail [name] ZOX:[id number] DOB: 09/29/1940 DOA: 02/05/2013  Referring physician: ER physician PCP: Daisy Floro, MD   Chief Complaint: mental status changes  HPI:  72 year old female with past medical history of diabetes and related neuropathy, hypertension, hypothyroidism who presented to [facility] ED for evaluation of ongoing and worsening mental state changes. Per patient and her family she has more balance problems/gait instability ever since the fall in 07 of this year. She never had immediate evaluation at the time of fall as it happened in [place] and on evaluation here by PCP her skull x ray did not reveal acute findings. She started experience blurry vision, having issues with depth and not realizing how far or close subjects are, she has problems holding objects in hands and experiences weakness. No loss of consciousness. No chest pain, no shortness of breath. No GU complaints. No abdominal pain, no nausea or vomiting. No fever or chills.  vomiting. She states she has a slight headache and indicates the frontal area. She has had rhinorrhea and nasal stuffiness. She states her vision seems to be blurred. She states she saw her pain management yesterday.  In ED, vitals are stable. CT head did not reveal acute intracranial findings. CBC revealed hemoglobin of 11.7. BMP reveled creatinine of 1.99. Neurology was consulted by ED physician.  Assessment and Plan:  Principal Problem:   Acute encephalopathy - no acute intracranial findings on CT head. Can't do MRI brain due to presence of metal in the body. - check TSH, ammonia level; follow up urinalysis results - will obtain PT/OT eval - appreciate neurology consult; ED physician, Dr. Lynelle Doctor will consult neurology  Active Problems:   Dehydration - continue IV fluids started in ED - pt blood pressure was 91/75 and she has received 1 L NS bolus; BP normalized thereafter   Diabetes mellitus,  controlled but with complication of diabetic neuropathy - check A1c - previous A1c is WNL - restart home insulin   Diabetic neuropathy - continue gabapentin   Hypertension - hold Hyzaar due to acute renal failure but may continue norvasc, trimpex and coreg   Hypothyroidism - continue synthroid   Hyperlipidemia - continue pravachol   Anemia - hemoglobin 11.7 on the admission   Acute renal failure - creatinine 1.99; possible causes are Hyzaar or dehydration - hold Hyzaar - continue IV fluids - follow up BMP in am  Radiological Exams on Admission: Ct Head Wo Contrast 02/05/2013   * IMPRESSION: Right sided sphenoid sinus disease.  Study otherwise unremarkable for age.  In particular, there is no demonstrable mass, hemorrhage, or acute appearing infarct.   Original Report Authenticated By: Bretta Bang, M.D.    EKG: Not done on the admission  Code Status: Full Family Communication: Pt at bedside Disposition Plan: Admit for further evaluation  Manson Passey, MD  [facility] Pager [phone]  Review of Systems:  Constitutional: Negative for fever, chills and malaise/fatigue. Negative for diaphoresis.  HENT: Negative for hearing loss, ear pain, nosebleeds, congestion, sore throat, neck pain, tinnitus and ear discharge.   Eyes: Negative for blurred vision, double vision, photophobia, pain, discharge and redness.  Respiratory: Negative for cough, hemoptysis, sputum production, shortness of breath, wheezing and stridor.   Cardiovascular: Negative for chest pain, palpitations, orthopnea, claudication and leg swelling.  Gastrointestinal: Negative for nausea, vomiting and abdominal pain. Negative for heartburn, constipation, blood in stool and melena.  Genitourinary: Negative for dysuria, urgency, frequency, hematuria and flank pain.  Musculoskeletal:  Negative for myalgias, back pain, joint pain and positive for recent fall.  Skin: Negative for itching and rash.  Neurological: per  HPI.  Endo/Heme/Allergies: Negative for environmental allergies and polydipsia. Does not bruise/bleed easily.  Psychiatric/Behavioral: Negative for suicidal ideas. The patient is not nervous/anxious.      Past Medical History  Diagnosis Date  . CAD (coronary artery disease)   . Diabetes mellitus   . Obesity   . Fibromyalgia   . Family hx of colon cancer   . Depression   . Hypertension   . Obesity   . GERD (gastroesophageal reflux disease)     pt denies  . Chronic kidney disease     low kidney function; BUN was high acc to pt  . PONV (postoperative nausea and vomiting)     with stapedectomy   Past Surgical History  Procedure Laterality Date  . Cholecystectomy    . Dilation and curettage of uterus    . Tubal ligation    . Stapedectomy    . Shoulder surgery      bilateral rotator cuff repairs   Social History:  reports that she has never smoked. She has never used smokeless tobacco. She reports that she does not drink alcohol or use illicit drugs.  Allergies  Allergen Reactions  . Onglyza [Saxagliptin]     Made legs and ankles swell  . Polysporin [Bacitracin-Polymyxin B]     Allergic to ointments such as polysporin, neosporin, cortisporin  . Cephalosporins   . Lisinopril Cough  . Penicillins Hives  . Victoza [Liraglutide]     constipation  . Mucinex [Guaifenesin Er] Rash    Family History:  Family History  Problem Relation Age of Onset  . Colon cancer Brother   . Diabetes Mother     and Sister   . Uterine cancer Sister      Prior to Admission medications   Medication Sig Start Date End Date Taking? Authorizing Provider  amLODipine (NORVASC) 10 MG tablet Take 5 mg by mouth every morning. Take 1/2 tablet daily   Yes Historical Provider, MD  aspirin EC 81 MG tablet Take 81 mg by mouth every evening.   Yes Historical Provider, MD  B Complex-C (B-COMPLEX WITH VITAMIN C) tablet Take 1 tablet by mouth every evening.   Yes Historical Provider, MD  carvedilol (COREG)  6.25 MG tablet Take 6.25 mg by mouth 2 (two) times daily with a meal.   Yes Historical Provider, MD  Cranberry 405 MG CAPS Take 1 capsule by mouth daily.   Yes Historical Provider, MD  Cyanocobalamin (VITAMIN B-12 IJ) Inject 1,000 mg as directed every 30 (thirty) days.   Yes Historical Provider, MD  fluticasone (FLONASE) 50 MCG/ACT nasal spray Place 2 sprays into the nose daily.   Yes Historical Provider, MD  gabapentin (NEURONTIN) 400 MG capsule Take 800 mg by mouth 3 (three) times daily.    Yes Historical Provider, MD  HYDROcodone-acetaminophen (NORCO) 10-325 MG per tablet Take 1 tablet by mouth every 6 (six) hours as needed for pain (back pain).   Yes Historical Provider, MD  insulin detemir (LEVEMIR) 100 UNIT/ML injection Inject 64 Units into the skin daily.    Yes Historical Provider, MD  levothyroxine (SYNTHROID, LEVOTHROID) 137 MCG tablet Take 137 mcg by mouth every morning.    Yes Historical Provider, MD  losartan-hydrochlorothiazide (HYZAAR) 100-25 MG per tablet Take 1 tablet by mouth daily.   Yes Historical Provider, MD  mometasone (ELOCON) 0.1 % cream Apply 1 application  topically daily as needed (Uses instead of neosprorin).   Yes Historical Provider, MD  Multiple Vitamin (MULTIVITAMIN) tablet Take 1 tablet by mouth every evening.    Yes Historical Provider, MD  nortriptyline (PAMELOR) 25 MG capsule Take 75 mg by mouth at bedtime.  11/11/10  Yes Historical Provider, MD  oxybutynin (DITROPAN-XL) 10 MG 24 hr tablet Take 10 mg by mouth every evening.   Yes Historical Provider, MD  potassium chloride (K-DUR,KLOR-CON) 10 MEQ tablet Take 10 mEq by mouth every evening.   Yes Historical Provider, MD  pravastatin (PRAVACHOL) 80 MG tablet Take 80 mg by mouth every evening.  12/03/10  Yes Historical Provider, MD  sertraline (ZOLOFT) 100 MG tablet Take 100 mg by mouth every morning.    Yes Historical Provider, MD  trimethoprim (TRIMPEX) 100 MG tablet Take 100 mg by mouth daily.   Yes Historical  Provider, MD  Vitamin D, Ergocalciferol, (DRISDOL) 50000 UNITS CAPS Take 50,000 Units by mouth every 30 (thirty) days.  09/27/10  Yes Historical Provider, MD  zolpidem (AMBIEN) 10 MG tablet Take 10 mg by mouth at bedtime as needed for sleep (sleep).   Yes Historical Provider, MD   Physical Exam: Filed Vitals:   02/05/13 1149 02/05/13 1535 02/05/13 1536 02/05/13 1538  BP: 126/55 105/48 91/75 101/53  Pulse: 63 56 60 62  Temp: 98.3 F (36.8 C)     TempSrc: Oral     Resp: 16     SpO2: 97%       Physical Exam  Constitutional: Appears well-developed and well-nourished. No distress.  HENT: Normocephalic. External right and left ear normal. Oropharynx is clear and moist.  Eyes: Conjunctivae and EOM are normal. PERRLA, no scleral icterus.  Neck: Normal ROM. Neck supple. No JVD. No tracheal deviation. No thyromegaly.  CVS: RRR, S1/S2 +, no murmurs, no gallops, no carotid bruit.  Pulmonary: Effort and breath sounds normal, no stridor, rhonchi, wheezes, rales.  Abdominal: Soft. BS +,  no distension, tenderness, rebound or guarding.  Musculoskeletal: Normal range of motion. No edema and no tenderness.  Lymphadenopathy: No lymphadenopathy noted, cervical, inguinal. Neuro: Alert. Normal reflexes, muscle tone coordination. No cranial nerve deficit. Skin: Skin is warm and dry. No rash noted. Not diaphoretic. No erythema. No pallor.  Psychiatric: Normal mood and affect. Behavior, judgment, thought content normal.   Labs on Admission:  Basic Metabolic Panel:  Recent Labs Lab 02/05/13 1245  NA 134*  K 4.5  CL 99  CO2 26  GLUCOSE 216*  BUN 24*  CREATININE 1.99*  CALCIUM 10.1   Liver Function Tests:  Recent Labs Lab 02/05/13 1245  AST 24  ALT 19  ALKPHOS 131*  BILITOT 0.3  PROT 6.7  ALBUMIN 3.6   No results found for this basename: LIPASE, AMYLASE,  in the last 168 hours No results found for this basename: AMMONIA,  in the last 168 hours CBC:  Recent Labs Lab 02/05/13 1245   WBC 7.6  NEUTROABS 4.5  HGB 11.7*  HCT 35.2*  MCV 88.7  PLT 315   Cardiac Enzymes: No results found for this basename: CKTOTAL, CKMB, CKMBINDEX, TROPONINI,  in the last 168 hours BNP: No components found with this basename: POCBNP,  CBG: No results found for this basename: GLUCAP,  in the last 168 hours  If 7PM-7AM, please contact night-coverage [email or web address] Password [email or web address] 02/05/2013, 4:09 PM

## 2013-02-05 NOTE — ED Provider Notes (Signed)
CSN: [id number]     Arrival date & time 02/05/13  1115 History   First MD Initiated Contact with Patient 02/05/13 1204     Chief Complaint  Patient presents with  . Fall  . Pain   (Consider location/radiation/quality/duration/timing/severity/associated sxs/prior Treatment) HPI  Patient reports at the end of July they were in [place] for a wedding and she was in the bathtub stepping out and fell backwards and hit the back of her head on the soap dish. She did not have loss of consciousness. She states it "jarred my whole body". She was seen when she got home and had a x-ray of her skull done by her PCP to make sure she did not have a skull fracture. She reports since then she has not been feeling well. She has jerking in her arms and her hands. She does have carpal tunnel but she is now having difficulty holding things and husband states she's dropping things frequently. She is getting forgetful and having some confusion. Today she called her son and called him by her other son's name. She also seems to have problems with her depth  perception and her husband report she is falling a lot because she is off balance. She is now unable to walk without holding onto something. She denies any nausea or vomiting. She states she has a slight headache and indicates the frontal area. She has had rhinorrhea and nasal stuffiness. She states her vision seems to be blurred. She states she saw her pain management yesterday.  PCP Dr Tenny Craw Pain management Dr Jordan Likes for her chronic back pain that radiates into her right leg.  Past Medical History  Diagnosis Date  . CAD (coronary artery disease)   . Diabetes mellitus   . Obesity   . Fibromyalgia   . Family hx of colon cancer   . Depression   . Hypertension   . Obesity   . GERD (gastroesophageal reflux disease)     pt denies  . Chronic kidney disease     low kidney function; BUN was high acc to pt  . PONV (postoperative nausea and vomiting)     with  stapedectomy   Past Surgical History  Procedure Laterality Date  . Cholecystectomy    . Dilation and curettage of uterus    . Tubal ligation    . Stapedectomy    . Shoulder surgery      bilateral rotator cuff repairs   Family History  Problem Relation Age of Onset  . Colon cancer Brother   . Diabetes Mother     and Sister   . Uterine cancer Sister    History  Substance Use Topics  . Smoking status: Never Smoker   . Smokeless tobacco: Never Used  . Alcohol Use: No   lives at home Lives with spouse  OB History   Grav Para Term Preterm Abortions TAB SAB Ect Mult Living                 Review of Systems  All other systems reviewed and are negative.    Allergies  Onglyza; Polysporin; Cephalosporins; Lisinopril; Penicillins; Victoza; and Mucinex  Home Medications   Current Outpatient Rx  Name  Route  Sig  Dispense  Refill  . amLODipine (NORVASC) 10 MG tablet   Oral   Take 5 mg by mouth every morning. Take 1/2 tablet daily         . aspirin EC 81 MG tablet   Oral  Take 81 mg by mouth every evening.         . B Complex-C (B-COMPLEX WITH VITAMIN C) tablet   Oral   Take 1 tablet by mouth every evening.         . carvedilol (COREG) 6.25 MG tablet   Oral   Take 6.25 mg by mouth 2 (two) times daily with a meal.         . Cranberry 405 MG CAPS   Oral   Take 1 capsule by mouth daily.         . Cyanocobalamin (VITAMIN B-12 IJ)   Injection   Inject 1,000 mg as directed every 30 (thirty) days.         . fluticasone (FLONASE) 50 MCG/ACT nasal spray   Nasal   Place 2 sprays into the nose daily.         Marland Kitchen gabapentin (NEURONTIN) 400 MG capsule   Oral   Take 800 mg by mouth 3 (three) times daily.          Marland Kitchen HYDROcodone-acetaminophen (NORCO) 10-325 MG per tablet   Oral   Take 1 tablet by mouth every 6 (six) hours as needed for pain (back pain).         . insulin detemir (LEVEMIR) 100 UNIT/ML injection   Subcutaneous   Inject 64 Units into  the skin daily.          Marland Kitchen levothyroxine (SYNTHROID, LEVOTHROID) 137 MCG tablet   Oral   Take 137 mcg by mouth every morning.          Marland Kitchen losartan-hydrochlorothiazide (HYZAAR) 100-25 MG per tablet   Oral   Take 1 tablet by mouth daily.         . mometasone (ELOCON) 0.1 % cream   Topical   Apply 1 application topically daily as needed (Uses instead of neosprorin).         . Multiple Vitamin (MULTIVITAMIN) tablet   Oral   Take 1 tablet by mouth every evening.          . nortriptyline (PAMELOR) 25 MG capsule   Oral   Take 75 mg by mouth at bedtime.          Marland Kitchen oxybutynin (DITROPAN-XL) 10 MG 24 hr tablet   Oral   Take 10 mg by mouth every evening.         . potassium chloride (K-DUR,KLOR-CON) 10 MEQ tablet   Oral   Take 10 mEq by mouth every evening.         . pravastatin (PRAVACHOL) 80 MG tablet   Oral   Take 80 mg by mouth every evening.          . sertraline (ZOLOFT) 100 MG tablet   Oral   Take 100 mg by mouth every morning.          . trimethoprim (TRIMPEX) 100 MG tablet   Oral   Take 100 mg by mouth daily.         . Vitamin D, Ergocalciferol, (DRISDOL) 50000 UNITS CAPS   Oral   Take 50,000 Units by mouth every 30 (thirty) days.          Marland Kitchen zolpidem (AMBIEN) 10 MG tablet   Oral   Take 10 mg by mouth at bedtime as needed for sleep (sleep).          BP 126/55  Pulse 63  Temp(Src) 98.3 F (36.8 C) (Oral)  Resp 16  SpO2 97%  Vital signs  normal   Physical Exam  Nursing note and vitals reviewed. Constitutional: She is oriented to person, place, and time. She appears well-developed and well-nourished.  Non-toxic appearance. She does not appear ill. No distress.  HENT:  Head: Normocephalic and atraumatic.  Right Ear: External ear normal.  Left Ear: External ear normal.  Nose: Nose normal. No mucosal edema or rhinorrhea.  Mouth/Throat: Oropharynx is clear and moist and mucous membranes are normal. No dental abscesses or edematous.   Eyes: Conjunctivae and EOM are normal. Pupils are equal, round, and reactive to light.  Neck: Normal range of motion and full passive range of motion without pain. Neck supple.  Cardiovascular: Normal rate, regular rhythm and normal heart sounds.  Exam reveals no gallop and no friction rub.   No murmur heard. Pulmonary/Chest: Effort normal and breath sounds normal. No respiratory distress. She has no wheezes. She has no rhonchi. She has no rales. She exhibits no tenderness and no crepitus.  Abdominal: Soft. Normal appearance and bowel sounds are normal. She exhibits no distension. There is no tenderness. There is no rebound and no guarding.  Musculoskeletal: Normal range of motion. She exhibits no edema and no tenderness.  Moves all extremities well.   Neurological: She is alert and oriented to person, place, and time. She has normal strength. No cranial nerve deficit.  Patient knows the month and year, she does not know the day of the week, although her wedding anniversary of 52 years was 2 days ago  Skin: Skin is warm, dry and intact. No rash noted. No erythema. No pallor.  Psychiatric: Her speech is normal and behavior is normal. Her mood appears not anxious.  Flat affect    ED Course  Procedures (including critical care time)  Medications  0.9 %  sodium chloride infusion (not administered)    Followed by  0.9 %  sodium chloride infusion (not administered)  sodium chloride 0.9 % bolus 1,000 mL (1,000 mLs Intravenous New Bag/Given 02/05/13 1445)   Discussed results of her tests. Patient states she has a wire in her ear and she cannot have an MRI. She is agreeable to being admitted to the hospital for IV fluids and neurology consultation. She has not produced a urine sample yet.   15:13 Dr Elisabeth Pigeon, admit to med-surg, team 8, consult neurology.   15:37 Dr Amada Jupiter will consult, suggests getting an ammonia level  Labs Review   Results for orders placed during the hospital encounter  of 02/05/13  CBC WITH DIFFERENTIAL      Result Value Range   WBC 7.6  4.0 - 10.5 K/uL   RBC 3.97  3.87 - 5.11 MIL/uL   Hemoglobin 11.7 (*) 12.0 - 15.0 g/dL   HCT 11.9 (*) 14.7 - 82.9 %   MCV 88.7  78.0 - 100.0 fL   MCH 29.5  26.0 - 34.0 pg   MCHC 33.2  30.0 - 36.0 g/dL   RDW 56.2  13.0 - 86.5 %   Platelets 315  150 - 400 K/uL   Neutrophils Relative % 59  43 - 77 %   Neutro Abs 4.5  1.7 - 7.7 K/uL   Lymphocytes Relative 29  12 - 46 %   Lymphs Abs 2.2  0.7 - 4.0 K/uL   Monocytes Relative 5  3 - 12 %   Monocytes Absolute 0.4  0.1 - 1.0 K/uL   Eosinophils Relative 6 (*) 0 - 5 %   Eosinophils Absolute 0.5  0.0 - 0.7 K/uL  Basophils Relative 1  0 - 1 %   Basophils Absolute 0.1  0.0 - 0.1 K/uL  COMPREHENSIVE METABOLIC PANEL      Result Value Range   Sodium 134 (*) 135 - 145 mEq/L   Potassium 4.5  3.5 - 5.1 mEq/L   Chloride 99  96 - 112 mEq/L   CO2 26  19 - 32 mEq/L   Glucose, Bld 216 (*) 70 - 99 mg/dL   BUN 24 (*) 6 - 23 mg/dL   Creatinine, Ser 1.61 (*) 0.50 - 1.10 mg/dL   Calcium 09.6  8.4 - 04.5 mg/dL   Total Protein 6.7  6.0 - 8.3 g/dL   Albumin 3.6  3.5 - 5.2 g/dL   AST 24  0 - 37 U/L   ALT 19  0 - 35 U/L   Alkaline Phosphatase 131 (*) 39 - 117 U/L   Total Bilirubin 0.3  0.3 - 1.2 mg/dL   GFR calc non Af Amer 24 (*) >90 mL/min   GFR calc Af Amer 28 (*) >90 mL/min   Laboratory interpretation all normal except worsening renal insufficiency, hyperglycemia, improvement of her chronic anemia most likely from dehydration   Imaging Review Ct Head Wo Contrast  02/05/2013   *RADIOLOGY REPORT*  Clinical Data: Trauma 6 weeks previously; confusion and gait disorder  CT HEAD WITHOUT CONTRAST  Technique:  Contiguous axial images were obtained from the base of the skull through the vertex without contrast. Study was obtained within 24 hours of patient arrival at the emergency department.  Comparison: August 22, 2012  Findings: There is age-related volume loss.  There is no mass,  hemorrhage, extra-axial fluid collection, or midline shift.  Wallace Cullens- white compartments are normal.  There is no demonstrable acute infarct.  Bony calvarium appears intact.  The mastoid air cells are clear. There is extensive right sphenoid sinus disease.  IMPRESSION: Right sided sphenoid sinus disease.  Study otherwise unremarkable for age.  In particular, there is no demonstrable mass, hemorrhage, or acute appearing infarct.   Original Report Authenticated By: Bretta Bang, M.D.    MDM  patient presents with worsening neurological symptoms since a fall approximately 6 weeks ago. Husband reports worsening confusion and inability to walk without assistance. Currently the etiology of the symptoms is unclear.    1. Renal insufficiency   2. Dehydration   3. Confusion   4. Difficulty walking    Plan admission   Devoria Albe, MD, Franz Dell, MD 02/05/13 1539

## 2013-02-05 NOTE — Progress Notes (Signed)
Utilization Review completed.  Einar Nolasco RN CM  

## 2013-02-05 NOTE — ED Notes (Signed)
Pt is aware of the need for a urine sample. There is a urine cup at bedside. Pt instructed to use call bell to let someone know when she needs assists to the restroom.

## 2013-02-05 NOTE — Progress Notes (Signed)
Discussed admission status with Dr. Devine. 

## 2013-02-05 NOTE — ED Notes (Signed)
Pt states that she fell in July and has been having "trouble" since.  States that she is having continued pain all over and also states that since July her depth perception is off and that she keeps "getting things mixed up".

## 2013-02-05 NOTE — Consult Note (Signed)
Reason for Consult: Altered mental status.   HPI:                                                                                                                                          Abigail [name] is an 72 y.o. female with a history of diabetes mellitus, fibromyalgia, hypertension, coronary artery disease, chronic kidney disease and depression, brought to the emergency room for complaints of confusion and difficulty staying awake. Clinical findings and laboratory studies showed findings consistent with dehydration. CT scan of her head showed no acute intracranial abnormality. Patient complains of difficulty with concentrating as well as falling asleep easily over the past several days. Although she was symptomatic yesterday she admits to operating an automobile without difficulty. No focal deficits have been noted. She's had no change in speech.  Past Medical History  Diagnosis Date  . CAD (coronary artery disease)   . Diabetes mellitus   . Obesity   . Fibromyalgia   . Family hx of colon cancer   . Depression   . Hypertension   . Obesity   . GERD (gastroesophageal reflux disease)     pt denies  . Chronic kidney disease     low kidney function; BUN was high acc to pt  . PONV (postoperative nausea and vomiting)     with stapedectomy    Past Surgical History  Procedure Laterality Date  . Cholecystectomy    . Dilation and curettage of uterus    . Tubal ligation    . Stapedectomy    . Shoulder surgery      bilateral rotator cuff repairs    Family History  Problem Relation Age of Onset  . Colon cancer Brother   . Diabetes Mother     and Sister   . Uterine cancer Sister     Social History:  reports that she has never smoked. She has never used smokeless tobacco. She reports that she does not drink alcohol or use illicit drugs.  Allergies  Allergen Reactions  . Onglyza [Saxagliptin]     Made legs and ankles swell  . Polysporin [Bacitracin-Polymyxin B]     Allergic to  ointments such as polysporin, neosporin, cortisporin  . Cephalosporins   . Lisinopril Cough  . Penicillins Hives  . Victoza [Liraglutide]     constipation  . Mucinex [Guaifenesin Er] Rash    MEDICATIONS:  I have reviewed the patient's current medications.   ROS:                                                                                                                                       History obtained from the patient  General ROS: negative for - chills, fatigue, fever, night sweats, weight gain or weight loss Psychological ROS: negative for - behavioral disorder, hallucinations, memory difficulties, mood swings or suicidal ideation Ophthalmic ROS: negative for - blurry vision, double vision, eye pain or loss of vision ENT ROS: negative for - epistaxis, nasal discharge, oral lesions, sore throat, tinnitus or vertigo Allergy and Immunology ROS: negative for - hives or itchy/watery eyes Hematological and Lymphatic ROS: negative for - bleeding problems, bruising or swollen lymph nodes Endocrine ROS: negative for - galactorrhea, hair pattern changes, polydipsia/polyuria or temperature intolerance Respiratory ROS: negative for - cough, hemoptysis, shortness of breath or wheezing Cardiovascular ROS: negative for - chest pain, dyspnea on exertion, edema or irregular heartbeat Gastrointestinal ROS: negative for - abdominal pain, diarrhea, hematemesis, nausea/vomiting or stool incontinence Genito-Urinary ROS: negative for - dysuria, hematuria, incontinence or urinary frequency/urgency Musculoskeletal ROS: negative for - joint swelling or muscular weakness Neurological ROS: as noted in HPI Dermatological ROS: negative for rash and skin lesion changes   Blood pressure 101/53, pulse 62, temperature 98.3 F (36.8 C), temperature source Oral, resp. rate 16, height 5\' 4"   (1.626 m), weight 105.235 kg (232 lb), SpO2 97.00%.   Neurologic Examination:                                                                                                      Mental Status: Alert, oriented, thought content appropriate.  Speech fluent without evidence of aphasia. Able to follow commands without difficulty. Cranial Nerves: II-Visual fields were normal. III/IV/VI-Pupils were equal and reacted. Extraocular movements were full and conjugate.    V/VII-no facial numbness and no facial weakness. VIII-normal. X-normal speech and symmetrical palatal movement. Motor: 5/5 bilaterally with normal tone and bulk Sensory: Normal throughout. Deep Tendon Reflexes: 1+ and symmetric. Plantars: Flexor bilaterally Cerebellar: Normal finger-to-nose testing.  No results found for this basename: cbc, bmp, coags, chol, tri, ldl, hga1c    Results for orders placed during the hospital encounter of 02/05/13 (from the past 48 hour(s))  CBC WITH DIFFERENTIAL     Status: Abnormal   Collection Time    02/05/13 12:45 PM      Result Value Range   WBC  7.6  4.0 - 10.5 K/uL   RBC 3.97  3.87 - 5.11 MIL/uL   Hemoglobin 11.7 (*) 12.0 - 15.0 g/dL   HCT 16.1 (*) 09.6 - 04.5 %   MCV 88.7  78.0 - 100.0 fL   MCH 29.5  26.0 - 34.0 pg   MCHC 33.2  30.0 - 36.0 g/dL   RDW 40.9  81.1 - 91.4 %   Platelets 315  150 - 400 K/uL   Neutrophils Relative % 59  43 - 77 %   Neutro Abs 4.5  1.7 - 7.7 K/uL   Lymphocytes Relative 29  12 - 46 %   Lymphs Abs 2.2  0.7 - 4.0 K/uL   Monocytes Relative 5  3 - 12 %   Monocytes Absolute 0.4  0.1 - 1.0 K/uL   Eosinophils Relative 6 (*) 0 - 5 %   Eosinophils Absolute 0.5  0.0 - 0.7 K/uL   Basophils Relative 1  0 - 1 %   Basophils Absolute 0.1  0.0 - 0.1 K/uL  COMPREHENSIVE METABOLIC PANEL     Status: Abnormal   Collection Time    02/05/13 12:45 PM      Result Value Range   Sodium 134 (*) 135 - 145 mEq/L   Potassium 4.5  3.5 - 5.1 mEq/L   Chloride 99  96 - 112 mEq/L    CO2 26  19 - 32 mEq/L   Glucose, Bld 216 (*) 70 - 99 mg/dL   BUN 24 (*) 6 - 23 mg/dL   Creatinine, Ser 7.82 (*) 0.50 - 1.10 mg/dL   Calcium 95.6  8.4 - 21.3 mg/dL   Total Protein 6.7  6.0 - 8.3 g/dL   Albumin 3.6  3.5 - 5.2 g/dL   AST 24  0 - 37 U/L   ALT 19  0 - 35 U/L   Alkaline Phosphatase 131 (*) 39 - 117 U/L   Total Bilirubin 0.3  0.3 - 1.2 mg/dL   GFR calc non Af Amer 24 (*) >90 mL/min   GFR calc Af Amer 28 (*) >90 mL/min   Comment: (NOTE)     The eGFR has been calculated using the CKD EPI equation.     This calculation has not been validated in all clinical situations.     eGFR's persistently <90 mL/min signify possible Chronic Kidney     Disease.  AMMONIA     Status: None   Collection Time    02/05/13  3:54 PM      Result Value Range   Ammonia 17  11 - 60 umol/L  MAGNESIUM     Status: None   Collection Time    02/05/13  5:00 PM      Result Value Range   Magnesium 2.3  1.5 - 2.5 mg/dL  PHOSPHORUS     Status: None   Collection Time    02/05/13  5:00 PM      Result Value Range   Phosphorus 4.6  2.3 - 4.6 mg/dL  APTT     Status: None   Collection Time    02/05/13  5:00 PM      Result Value Range   aPTT 30  24 - 37 seconds  PROTIME-INR     Status: None   Collection Time    02/05/13  5:00 PM      Result Value Range   Prothrombin Time 13.1  11.6 - 15.2 seconds   INR 1.01  0.00 - 1.49  Ct Head Wo Contrast  02/05/2013   *RADIOLOGY REPORT*  Clinical Data: Trauma 6 weeks previously; confusion and gait disorder  CT HEAD WITHOUT CONTRAST  Technique:  Contiguous axial images were obtained from the base of the skull through the vertex without contrast. Study was obtained within 24 hours of patient arrival at the emergency department.  Comparison: August 22, 2012  Findings: There is age-related volume loss.  There is no mass, hemorrhage, extra-axial fluid collection, or midline shift.  Wallace Cullens- white compartments are normal.  There is no demonstrable acute infarct.  Bony calvarium  appears intact.  The mastoid air cells are clear. There is extensive right sphenoid sinus disease.  IMPRESSION: Right sided sphenoid sinus disease.  Study otherwise unremarkable for age.  In particular, there is no demonstrable mass, hemorrhage, or acute appearing infarct.   Original Report Authenticated By: Bretta Bang, M.D.     Assessment/Plan: 71 year old lady admitted with altered mental status and associated dehydration. At this point patient's mental status is normal with no objective signs of cognitive deficit nor an abnormality in level of consciousness. No focal neurologic deficits were noted.  No further neurological intervention is indicated at this point. No further neurodiagnostic studies are recommended.  We will sign off on her care at this point, but remain available for reevaluation if clinically indicated.  C.R. Roseanne Reno, MD [facility] [phone]  02/05/2013, 9:23 PM

## 2013-02-05 NOTE — Progress Notes (Signed)
PHARMACIST - PHYSICIAN ORDER COMMUNICATION  CONCERNING: P&T Medication Policy on Herbal Medications  DESCRIPTION:  This patient's order for:  CRANBERRY  has been noted.  This product(s) is classified as an "herbal" or natural product. Due to a lack of definitive safety studies or FDA approval, nonstandard manufacturing practices, plus the potential risk of unknown drug-drug interactions while on inpatient medications, the Pharmacy and Therapeutics Committee does not permit the use of "herbal" or natural products of this type within [facility].   ACTION TAKEN: The pharmacy department is unable to verify this order at this time and your patient has been informed of this safety policy. Please reevaluate patient's clinical condition at discharge and address if the herbal or natural product(s) should be resumed at that time.   Thank you, Terrilee Files, PharmD

## 2013-02-05 NOTE — Progress Notes (Signed)
Patient admitted to 5E transferred from ED, alert and oriented, family at bedside husband and son, transferred on hospital bed, patient tolerated well, BP=> 138/78, p=> 67, Temp=> 98.5, Sats=>97% on RA, skin warm and dry to touch, voiding clear yellow urine, IV patent infusing NS @ 125 cc/hr, skin intact without any signs of wounds, patient in stable condition at this time

## 2013-02-05 NOTE — ED Notes (Signed)
Called 5E to give report, RN unable to take report at this time, they will call back.

## 2013-02-06 DIAGNOSIS — N289 Disorder of kidney and ureter, unspecified: Secondary | ICD-10-CM

## 2013-02-06 DIAGNOSIS — F29 Unspecified psychosis not due to a substance or known physiological condition: Secondary | ICD-10-CM

## 2013-02-06 DIAGNOSIS — E119 Type 2 diabetes mellitus without complications: Secondary | ICD-10-CM

## 2013-02-06 LAB — COMPREHENSIVE METABOLIC PANEL
ALT: 16 U/L (ref 0–35)
AST: 21 U/L (ref 0–37)
Albumin: 3.2 g/dL — ABNORMAL LOW (ref 3.5–5.2)
Alkaline Phosphatase: 123 U/L — ABNORMAL HIGH (ref 39–117)
BUN: 22 mg/dL (ref 6–23)
CO2: 26 mEq/L (ref 19–32)
Calcium: 9.3 mg/dL (ref 8.4–10.5)
Chloride: 103 mEq/L (ref 96–112)
Creatinine, Ser: 1.95 mg/dL — ABNORMAL HIGH (ref 0.50–1.10)
GFR calc Af Amer: 29 mL/min — ABNORMAL LOW (ref >90)
GFR calc non Af Amer: 25 mL/min — ABNORMAL LOW (ref >90)
Glucose, Bld: 102 mg/dL — ABNORMAL HIGH (ref 70–99)
Potassium: 3.9 mEq/L (ref 3.5–5.1)
Sodium: 137 mEq/L (ref 135–145)
Total Bilirubin: 0.3 mg/dL (ref 0.3–1.2)
Total Protein: 6 g/dL (ref 6.0–8.3)

## 2013-02-06 LAB — CBC
HCT: 32.1 % — ABNORMAL LOW (ref 36.0–46.0)
Hemoglobin: 10.6 g/dL — ABNORMAL LOW (ref 12.0–15.0)
MCH: 29.3 pg (ref 26.0–34.0)
MCHC: 33 g/dL (ref 30.0–36.0)
MCV: 88.7 fL (ref 78.0–100.0)
Platelets: 273 10*3/uL (ref 150–400)
RBC: 3.62 MIL/uL — ABNORMAL LOW (ref 3.87–5.11)
RDW: 14.3 % (ref 11.5–15.5)
WBC: 7.2 10*3/uL (ref 4.0–10.5)

## 2013-02-06 LAB — GLUCOSE, CAPILLARY: Glucose-Capillary: 114 mg/dL — ABNORMAL HIGH (ref 70–99)

## 2013-02-06 LAB — TSH: TSH: 3.121 u[IU]/mL (ref 0.350–4.500)

## 2013-02-06 MED ORDER — NORTRIPTYLINE HCL 25 MG PO CAPS: 50.0000 mg | ORAL_CAPSULE | Freq: Every day | ORAL | Status: DC

## 2013-02-06 MED ORDER — GABAPENTIN 400 MG PO CAPS
400.0000 mg | ORAL_CAPSULE | Freq: Three times a day (TID) | ORAL | Status: DC
  Administered 2013-02-06 (×2): 400 mg via ORAL
  Filled 2013-02-06 (×3): qty 1

## 2013-02-06 MED ORDER — GABAPENTIN 400 MG PO CAPS: 400.0000 mg | ORAL_CAPSULE | Freq: Three times a day (TID) | ORAL | Status: AC

## 2013-02-06 MED ORDER — INSULIN ASPART 100 UNIT/ML ~~LOC~~ SOLN: 0.0000 [IU] | Freq: Three times a day (TID) | SUBCUTANEOUS | Status: DC

## 2013-02-06 MED ORDER — SODIUM CHLORIDE 0.9 % IV SOLN: INTRAVENOUS | Status: DC

## 2013-02-06 MED ORDER — PRAVASTATIN SODIUM 40 MG PO TABS
80.0000 mg | ORAL_TABLET | Freq: Every day | ORAL | Status: DC
  Filled 2013-02-06: qty 2

## 2013-02-06 NOTE — Progress Notes (Signed)
Met with pt to discuss [facility] services. She chose [facility] to provide [facility] PT/OT services. Referral made.  Algernon Huxley RN, BSN  [phone]

## 2013-02-06 NOTE — Progress Notes (Signed)
[facility] PROGRESS NOTE  Abigail [name] ZOX:[id number] DOB: 08/04/1940 DOA: 02/05/2013 PCP: Daisy Floro, MD  Assessment/Plan: 1. Intermittent confusion and cognitive decline -I suspect this is related to Polypharmacy namely high dose Gabapentin, AC Side-effects of Oxybutynin, Zoloft, Norco, nortriptyline -cut down Neurontin, advised about weaning down meds as tolerated -CT head benign, mild dehydration could be contributing too -appreciate Neuro input, no further workup recommended at this time. -Ambulate, Pt/OT evals  2. Mild renal insufficiency on CKD -baseline creatinine around 1.4 -hydrate, hold ARB and repeat  3. DM -continue lantus, add SSI  4. Hypothyroidism -continue synthroid   Code Status: full Family Communication: none at bedside Disposition Plan: home later today   Consultants:  NEurology  HPI/Subjective: Feels ok, intermittently having difficulty with focus, dizziness etc  Objective: Filed Vitals:   02/06/13 0549  BP: 118/64  Pulse: 61  Temp: 98.4 F (36.9 C)  Resp: 18    Intake/Output Summary (Last 24 hours) at 02/06/13 1224 Last data filed at 02/06/13 0554  Gross per 24 hour  Intake 1706.25 ml  Output      0 ml  Net 1706.25 ml   Filed Weights   02/05/13 1538  Weight: 105.235 kg (232 lb)    Exam:   General:  Alert, awake, oriented to self, place and time  Cardiovascular: S1S2/RRR  Respiratory: CTAB  Abdomen: soft, Nt, BS present  Musculoskeletal: no edema c/c   Data Reviewed: Basic Metabolic Panel:  Recent Labs Lab 02/05/13 1245 02/05/13 1700 02/06/13 0508  NA 134*  --  137  K 4.5  --  3.9  CL 99  --  103  CO2 26  --  26  GLUCOSE 216*  --  102*  BUN 24*  --  22  CREATININE 1.99*  --  1.95*  CALCIUM 10.1  --  9.3  MG  --  2.3  --   PHOS  --  4.6  --    Liver Function Tests:  Recent Labs Lab 02/05/13 1245 02/06/13 0508  AST 24 21  ALT 19 16  ALKPHOS 131* 123*  BILITOT 0.3 0.3  PROT 6.7 6.0   ALBUMIN 3.6 3.2*   No results found for this basename: LIPASE, AMYLASE,  in the last 168 hours  Recent Labs Lab 02/05/13 1554  AMMONIA 17   CBC:  Recent Labs Lab 02/05/13 1245 02/06/13 0508  WBC 7.6 7.2  NEUTROABS 4.5  --   HGB 11.7* 10.6*  HCT 35.2* 32.1*  MCV 88.7 88.7  PLT 315 273   Cardiac Enzymes: No results found for this basename: CKTOTAL, CKMB, CKMBINDEX, TROPONINI,  in the last 168 hours BNP (last 3 results) No results found for this basename: PROBNP,  in the last 8760 hours CBG:  Recent Labs Lab 02/06/13 0809  GLUCAP 114*    No results found for this or any previous visit (from the past 240 hour(s)).   Studies: Ct Head Wo Contrast  02/05/2013   *RADIOLOGY REPORT*  Clinical Data: Trauma 6 weeks previously; confusion and gait disorder  CT HEAD WITHOUT CONTRAST  Technique:  Contiguous axial images were obtained from the base of the skull through the vertex without contrast. Study was obtained within 24 hours of patient arrival at the emergency department.  Comparison: August 22, 2012  Findings: There is age-related volume loss.  There is no mass, hemorrhage, extra-axial fluid collection, or midline shift.  Wallace Cullens- white compartments are normal.  There is no demonstrable acute infarct.  Bony calvarium appears  intact.  The mastoid air cells are clear. There is extensive right sphenoid sinus disease.  IMPRESSION: Right sided sphenoid sinus disease.  Study otherwise unremarkable for age.  In particular, there is no demonstrable mass, hemorrhage, or acute appearing infarct.   Original Report Authenticated By: Bretta Bang, M.D.    Scheduled Meds: . sodium chloride  1,000 mL Intravenous Once  . amLODipine  5 mg Oral q morning - 10a  . aspirin EC  81 mg Oral QPM  . B-complex with vitamin C  1 tablet Oral QPM  . carvedilol  6.25 mg Oral BID WC  . fluticasone  2 spray Each Nare Daily  . gabapentin  400 mg Oral TID  . influenza vac split quadrivalent PF  0.5 mL  Intramuscular Tomorrow-1000  . insulin detemir  64 Units Subcutaneous Daily  . levothyroxine  137 mcg Oral QAC breakfast  . multivitamin with minerals  1 tablet Oral QPM  . nortriptyline  75 mg Oral QHS  . oxybutynin  10 mg Oral QPM  . potassium chloride  10 mEq Oral QPM  . pravastatin  80 mg Oral q1800  . sertraline  100 mg Oral q morning - 10a  . trimethoprim  100 mg Oral Daily  . [START ON 03/05/2013] Vitamin D (Ergocalciferol)  50,000 Units Oral Q30 days   Continuous Infusions: . sodium chloride 75 mL/hr at 02/06/13 0858    Principal Problem:   Acute encephalopathy Active Problems:   Dehydration   Diabetes mellitus   Hypertension   Hypothyroidism   Hyperlipidemia   Anemia   Acute renal failure    Time spent:    [facility]  [facility] Pager [phone]. If 7PM-7AM, please contact night-coverage at [email or web address], password [facility] 02/06/2013, 12:24 PM  LOS: 1 day

## 2013-02-06 NOTE — Progress Notes (Signed)
Discharge instructions given to pt/spouse, verbalized understanding. Left the unit in stable condition.  

## 2013-02-06 NOTE — Evaluation (Signed)
Occupational Therapy Evaluation Patient Details Name: Abigail [name] MRN: [id number] DOB: 03-22-1941 Today's Date: 02/06/2013 Time: 1012-1030 OT Time Calculation (min): 18 min  OT Assessment / Plan / Recommendation History of present illness  Abigail [name] is an 72 y.o. female with a history of diabetes mellitus, fibromyalgia, hypertension, coronary artery disease, chronic kidney disease and depression, brought to the emergency room for complaints of confusion and difficulty staying awake. Clinical findings and laboratory studies showed findings consistent with dehydration. CT scan of her head showed no acute intracranial abnormality. Patient complains of difficulty with concentrating as well as falling asleep easily over the past several days. Although she was symptomatic yesterday she admits to operating an automobile without difficulty. No focal deficits have been noted. She's had no change in speech.   Clinical Impression   Pt presents to OT with decreased I with ADL activity due to back pain.  Pt will benefit from skilled  OT to increase I with ADL activity and return to PLOF    OT Assessment  Patient needs continued OT Services    Follow Up Recommendations  Home health OT       Equipment Recommendations  Other (comment)       Frequency  Min 2X/week       Pertinent Vitals/Pain Significant. RN notified.    ADL  Grooming: Simulated;Min guard Where Assessed - Grooming: Unsupported standing Upper Body Dressing: Simulated;Min guard Where Assessed - Upper Body Dressing: Unsupported standing Lower Body Dressing: Maximal assistance Where Assessed - Lower Body Dressing: Supported sit to stand Toilet Transfer: Performed;Min guard Statistician Method: Sit to Barista: Comfort height toilet Toileting - Architect and Hygiene: Simulated;Min guard Where Assessed - Engineer, mining and Hygiene: Standing    OT Diagnosis:  Generalized weakness  OT Problem List: Decreased strength;Decreased activity tolerance;Pain   OT Goals(Current goals can be found in the care plan section) Acute Rehab OT Goals Patient Stated Goal: have a day without pain OT Goal Formulation: With patient Time For Goal Achievement: 02/20/13  Visit Information  Last OT Received On: 02/06/13 History of Present Illness:  Abigail [name] is an 72 y.o. female with a history of diabetes mellitus, fibromyalgia, hypertension, coronary artery disease, chronic kidney disease and depression, brought to the emergency room for complaints of confusion and difficulty staying awake. Clinical findings and laboratory studies showed findings consistent with dehydration. CT scan of her head showed no acute intracranial abnormality. Patient complains of difficulty with concentrating as well as falling asleep easily over the past several days. Although she was symptomatic yesterday she admits to operating an automobile without difficulty. No focal deficits have been noted. She's had no change in speech.       Prior Functioning     Home Living Family/patient expects to be discharged to:: Private residence Living Arrangements: Spouse/significant other Available Help at Discharge: Family Type of Home: House Home Access: Stairs to enter Secretary/administrator of Steps: 5 Entrance Stairs-Rails: Right Home Layout: One level Home Equipment: Environmental consultant - 2 wheels Prior Function Level of Independence: Independent Communication Communication: No difficulties         Vision/Perception Vision - History Patient Visual Report: No change from baseline   Cognition  Cognition Arousal/Alertness: Awake/alert Behavior During Therapy: WFL for tasks assessed/performed Overall Cognitive Status: Within Functional Limits for tasks assessed    Extremity/Trunk Assessment Upper Extremity Assessment Upper Extremity Assessment: Generalized weakness     Mobility Bed  Mobility Bed Mobility: Left Sidelying  to Sit Left Sidelying to Sit: 4: Min guard Transfers Transfers: Sit to Stand;Stand to Sit Sit to Stand: 4: Min guard;From bed;From toilet Stand to Sit: 4: Min guard;To toilet;To bed           End of Session OT - End of Session Activity Tolerance: Patient tolerated treatment well Patient left: in bed;with call bell/phone within reach  GO     [facility], [place] Kung 02/06/2013, 10:49 AM

## 2013-02-06 NOTE — Progress Notes (Signed)
The order for simvastatin(Zocor) was changed to the patient's home dose of pravastatin 80mg  daily due to the potential drug interaction with amlodipine.  When taken in combination with medications that inhibit its metabolism, simvastatin can accumulate which increases the risk of liver toxicity, myopathy, or rhabdomyolysis.  Simvastatin dose should not exceed 20mg /day in patients taking amlodipine, ranolazine or amiodarone.   Reece Packer 02/06/2013 10:14 AM

## 2013-02-06 NOTE — Progress Notes (Signed)
Inpatient Diabetes Program Recommendations  AACE/ADA: New Consensus Statement on Inpatient Glycemic Control (2013)  Target Ranges:  Prepandial:   less than 140 mg/dL      Peak postprandial:   less than 180 mg/dL (1-2 hours)      Critically ill patients:  140 - 180 mg/dL   Inpatient Diabetes Program Recommendations Correction (SSI): start Novolog moderate scale TID  Thank you  Piedad Climes BSN, RN,CDE Inpatient Diabetes Coordinator [phone] (team pager)

## 2013-02-06 NOTE — Evaluation (Signed)
Physical Therapy Evaluation Patient Details Name: Abigail [name] MRN: [id number] DOB: November 17, 1940 Today's Date: 02/06/2013 Time: 1015-1030 PT Time Calculation (min): 15 min  PT Assessment / Plan / Recommendation History of Present Illness   Abigail [name] is an 72 y.o. female admitted 02/05/13  to the emergency room for complaints of confusion and difficulty staying awake. Clinical findings and laboratory studies showed findings consistent with dehydration. CT scan of her head showed no acute intracranial abnormality. Patient complains of difficulty with concentrating as well as falling asleep easily over the past several days.  Clinical Impression  Pt reports feeling tired and has back pain. Pt will benefit from PT while in acute care to improve in functional mobility to return to independence , safety and instruction in back precautions. Pt reports she received a back brace this week but does not have here.    PT Assessment  Patient needs continued PT services    Follow Up Recommendations  Home health PT    Does the patient have the potential to tolerate intense rehabilitation      Barriers to Discharge        Equipment Recommendations  None recommended by PT    Recommendations for Other Services     Frequency Min 3X/week    Precautions / Restrictions Precautions Precautions: Fall Precaution Comments: back   Pertinent Vitals/Pain 8 back pain, RN notified.      Mobility  Bed Mobility Bed Mobility: Sit to Sidelying Left Left Sidelying to Sit: 4: Min guard Sit to Sidelying Left: 5: Supervision Details for Bed Mobility Assistance: multimodal cues for back precautions and safety. Transfers Sit to Stand: 4: Min guard;From bed;From toilet Stand to Sit: 4: Min guard;To toilet;To bed Details for Transfer Assistance: verbal cueas for safe standing., back precautions. Ambulation/Gait Ambulation/Gait Assistance: 4: Min guard Ambulation Distance (Feet): 60  Feet Ambulation/Gait Assistance Details: min guard  for safety, gait slow Gait Pattern: Step-through pattern;Wide base of support    Exercises     PT Diagnosis: Difficulty walking;Acute pain  PT Problem List: Decreased strength;Decreased activity tolerance;Decreased mobility;Pain;Decreased safety awareness;Decreased knowledge of use of DME;Decreased knowledge of precautions PT Treatment Interventions: Gait training;Functional mobility training;Therapeutic activities;Patient/family education     PT Goals(Current goals can be found in the care plan section) Acute Rehab PT Goals Patient Stated Goal: have a day without pain PT Goal Formulation: With patient Time For Goal Achievement: 02/20/13 Potential to Achieve Goals: Good  Visit Information  Last PT Received On: 02/06/13 Assistance Needed: +1 History of Present Illness:  Abigail [name] is an 72 y.o. female admitted 02/05/13  to the emergency room for complaints of confusion and difficulty staying awake. Clinical findings and laboratory studies showed findings consistent with dehydration. CT scan of her head showed no acute intracranial abnormality. Patient complains of difficulty with concentrating as well as falling asleep easily over the past several days.       Prior Functioning  Home Living Family/patient expects to be discharged to:: Private residence Living Arrangements: Spouse/significant other Available Help at Discharge: Family Type of Home: House Home Access: Stairs to enter Secretary/administrator of Steps: 5 Entrance Stairs-Rails: Right Home Layout: One level Home Equipment: Environmental consultant - 2 wheels Prior Function Level of Independence: Independent Communication Communication: No difficulties    Cognition  Cognition Arousal/Alertness: Awake/alert Behavior During Therapy: WFL for tasks assessed/performed Overall Cognitive Status: Within Functional Limits for tasks assessed    Extremity/Trunk Assessment Upper  Extremity Assessment Upper Extremity Assessment: Generalized weakness Lower  Extremity Assessment Lower Extremity Assessment: Generalized weakness Cervical / Trunk Assessment Cervical / Trunk Assessment: Normal   Balance Balance Balance Assessed: Yes Static Standing Balance Static Standing - Balance Support: No upper extremity supported Static Standing - Level of Assistance: 5: Stand by assistance  End of Session PT - End of Session Activity Tolerance: Patient limited by fatigue;Patient limited by pain Patient left: in bed;with call bell/phone within reach Nurse Communication: Mobility status;Patient requests pain meds  GP     Rada Hay 02/06/2013, 1:04 PM  Blanchard Kelch PT [phone]

## 2013-02-06 NOTE — Discharge Summary (Addendum)
Physician Discharge Summary  Abigail [name]:[id number] DOB: July 29, 1940 DOA: 02/05/2013  PCP: Daisy Floro, MD  Admit date: 02/05/2013 Discharge date: 02/06/2013  Time spent:45 minutes  Recommendations for Outpatient Follow-up:  1. PCP in 1 week 2. Bmet in 1 week 3. Home health PT/OT  Discharge Diagnoses:  Principal Problem:   Acute encephalopathy   Polypharmacy   Dehydration   Diabetes mellitus   Hypertension   Hypothyroidism   Hyperlipidemia   Anemia   Renal Failure, acute on chronic   Fibromyalgia   Diabetic Neuropathy   Discharge Condition:stable  Diet recommendation: carb modified  Filed Weights   02/05/13 1538  Weight: 105.235 kg (232 lb)    History of present illness:  72 year old female with past medical history of diabetes and related neuropathy, fibromyalgia, hypertension, hypothyroidism who presented to [facility] ED for evaluation of ongoing and worsening mental state changes. Per patient and her family she has more balance problems/gait instability ever since the fall in July of this year.  She started experiencing issues with depth and not realizing how far or close subjects are, she has problems holding objects in hands and experiences weakness, being drowsy etc. No loss of consciousness. No chest pain, no shortness of breath. No GU complaints. No abdominal pain, no nausea or vomiting. No fever or chills.  In ED, vitals are stable. CT head did not reveal acute intracranial findings. CBC revealed hemoglobin of 11.7. BMP reveled creatinine of 1.99.   Hospital Course:  1. Intermittent confusion and cognitive decline for 2-57months -I suspect this is related to Polypharmacy namely high dose Gabapentin, AC Side-effects of Oxybutynin, Zoloft, Norco, nortriptyline  -I  suspect that due to slight deterioration in kidney function, and hence decreased renal clearance increased drug levels especially of Gabapentin contributing to drowsiness, dizziness, ataxia and  fatigue. -Cut down Gabapentin to 400mg  TID from 800mg  TID, may need to be cut down further if kidneys deteriorate down the road. -Also stopped Ambien and cut down Nortriptyline -advised about weaning down meds other meds listed above as tolerated -CT head benign, mild dehydration felt to be contributing too. -Was seen by Dr.Stewart Neurology in consultation, no further workup recommended at this time.  -Ambulate, Pt/OT evals completed, home health PT/OT set up prior to discharge  2. Mild renal insufficiency on CKD  -baseline creatinine around 1.4  -hydrated with Normal saline, hold Hyzaar and repeat Bmet in 1 week -if GFR not better will need FU with her Nephrologist in [place]  3. DM  -continue lantus  -Hb aic 7.6  4. Hypothyroidism  -continue synthroid   Consultations:  Neurology  Discharge Exam: Filed Vitals:   02/06/13 0549  BP: 118/64  Pulse: 61  Temp: 98.4 F (36.9 C)  Resp: 18    General:AAOx3 Cardiovascular: S1S2/RRR Respiratory: CTAB  Discharge Instructions  Discharge Orders   Future Orders Complete By Expires   Diet Carb Modified  As directed    Increase activity slowly  As directed        Medication List    STOP taking these medications       losartan-hydrochlorothiazide 100-25 MG per tablet  Commonly known as:  HYZAAR     zolpidem 10 MG tablet  Commonly known as:  AMBIEN      TAKE these medications       amLODipine 10 MG tablet  Commonly known as:  NORVASC  Take 5 mg by mouth every morning. Take 1/2 tablet daily     aspirin EC  81 MG tablet  Take 81 mg by mouth every evening.     B-complex with vitamin C tablet  Take 1 tablet by mouth every evening.     carvedilol 6.25 MG tablet  Commonly known as:  COREG  Take 6.25 mg by mouth 2 (two) times daily with a meal.     Cranberry 405 MG Caps  Take 1 capsule by mouth daily.     fluticasone 50 MCG/ACT nasal spray  Commonly known as:  FLONASE  Place 2 sprays into the nose daily.      gabapentin 400 MG capsule  Commonly known as:  NEURONTIN  Take 1 capsule (400 mg total) by mouth 3 (three) times daily.     HYDROcodone-acetaminophen 10-325 MG per tablet  Commonly known as:  NORCO  Take 1 tablet by mouth every 6 (six) hours as needed for pain (back pain).     insulin detemir 100 UNIT/ML injection  Commonly known as:  LEVEMIR  Inject 64 Units into the skin daily.     levothyroxine 137 MCG tablet  Commonly known as:  SYNTHROID, LEVOTHROID  Take 137 mcg by mouth every morning.     mometasone 0.1 % cream  Commonly known as:  ELOCON  Apply 1 application topically daily as needed (Uses instead of neosprorin).     multivitamin tablet  Take 1 tablet by mouth every evening.     nortriptyline 25 MG capsule  Commonly known as:  PAMELOR  Take 2 capsules (50 mg total) by mouth at bedtime.     oxybutynin 10 MG 24 hr tablet  Commonly known as:  DITROPAN-XL  Take 10 mg by mouth every evening.     potassium chloride 10 MEQ tablet  Commonly known as:  K-DUR,KLOR-CON  Take 10 mEq by mouth every evening.     pravastatin 80 MG tablet  Commonly known as:  PRAVACHOL  Take 80 mg by mouth every evening.     sertraline 100 MG tablet  Commonly known as:  ZOLOFT  Take 100 mg by mouth every morning.     trimethoprim 100 MG tablet  Commonly known as:  TRIMPEX  Take 100 mg by mouth daily.     VITAMIN B-12 IJ  Inject 1,000 mg as directed every 30 (thirty) days.     Vitamin D (Ergocalciferol) 50000 UNITS Caps capsule  Commonly known as:  DRISDOL  Take 50,000 Units by mouth every 30 (thirty) days.       Allergies  Allergen Reactions  . Onglyza [Saxagliptin]     Made legs and ankles swell  . Polysporin [Bacitracin-Polymyxin B]     Allergic to ointments such as polysporin, neosporin, cortisporin  . Cephalosporins   . Lisinopril Cough  . Penicillins Hives  . Victoza [Liraglutide]     constipation  . Mucinex [Guaifenesin Er] Rash       Follow-up Information    Follow up with Daisy Floro, MD In 1 week.   Specialty:  Family Medicine   Contact information:   [address] [place] [address] [phone]       Follow up with Lab-Bmet In 1 week.       The results of significant diagnostics from this hospitalization (including imaging, microbiology, ancillary and laboratory) are listed below for reference.    Significant Diagnostic Studies: Ct Head Wo Contrast  02/05/2013   *RADIOLOGY REPORT*  Clinical Data: Trauma 6 weeks previously; confusion and gait disorder  CT HEAD WITHOUT CONTRAST  Technique:  Contiguous  axial images were obtained from the base of the skull through the vertex without contrast. Study was obtained within 24 hours of patient arrival at the emergency department.  Comparison: August 22, 2012  Findings: There is age-related volume loss.  There is no mass, hemorrhage, extra-axial fluid collection, or midline shift.  Wallace Cullens- white compartments are normal.  There is no demonstrable acute infarct.  Bony calvarium appears intact.  The mastoid air cells are clear. There is extensive right sphenoid sinus disease.  IMPRESSION: Right sided sphenoid sinus disease.  Study otherwise unremarkable for age.  In particular, there is no demonstrable mass, hemorrhage, or acute appearing infarct.   Original Report Authenticated By: Bretta Bang, M.D.    Microbiology: No results found for this or any previous visit (from the past 240 hour(s)).   Labs: Basic Metabolic Panel:  Recent Labs Lab 02/05/13 1245 02/05/13 1700 02/06/13 0508  NA 134*  --  137  K 4.5  --  3.9  CL 99  --  103  CO2 26  --  26  GLUCOSE 216*  --  102*  BUN 24*  --  22  CREATININE 1.99*  --  1.95*  CALCIUM 10.1  --  9.3  MG  --  2.3  --   PHOS  --  4.6  --    Liver Function Tests:  Recent Labs Lab 02/05/13 1245 02/06/13 0508  AST 24 21  ALT 19 16  ALKPHOS 131* 123*  BILITOT 0.3 0.3  PROT 6.7 6.0  ALBUMIN 3.6 3.2*   No results found for this  basename: LIPASE, AMYLASE,  in the last 168 hours  Recent Labs Lab 02/05/13 1554  AMMONIA 17   CBC:  Recent Labs Lab 02/05/13 1245 02/06/13 0508  WBC 7.6 7.2  NEUTROABS 4.5  --   HGB 11.7* 10.6*  HCT 35.2* 32.1*  MCV 88.7 88.7  PLT 315 273   Cardiac Enzymes: No results found for this basename: CKTOTAL, CKMB, CKMBINDEX, TROPONINI,  in the last 168 hours BNP: BNP (last 3 results) No results found for this basename: PROBNP,  in the last 8760 hours CBG:  Recent Labs Lab 02/06/13 0809  GLUCAP 114*       Signed:  Maxi Carreras  [facility] 02/06/2013, 2:26 PM

## 2013-02-07 DIAGNOSIS — R531 Weakness: Secondary | ICD-10-CM | POA: Insufficient documentation

## 2013-02-07 DIAGNOSIS — Z87440 Personal history of urinary (tract) infections: Secondary | ICD-10-CM | POA: Diagnosis not present

## 2013-02-07 DIAGNOSIS — Z794 Long term (current) use of insulin: Secondary | ICD-10-CM | POA: Diagnosis not present

## 2013-02-07 DIAGNOSIS — D72829 Elevated white blood cell count, unspecified: Secondary | ICD-10-CM | POA: Insufficient documentation

## 2013-02-07 DIAGNOSIS — Z7982 Long term (current) use of aspirin: Secondary | ICD-10-CM | POA: Diagnosis not present

## 2013-02-07 DIAGNOSIS — IMO0001 Reserved for inherently not codable concepts without codable children: Secondary | ICD-10-CM | POA: Diagnosis present

## 2013-02-07 DIAGNOSIS — N183 Chronic kidney disease, stage 3 unspecified: Secondary | ICD-10-CM | POA: Diagnosis present

## 2013-02-07 DIAGNOSIS — N289 Disorder of kidney and ureter, unspecified: Secondary | ICD-10-CM | POA: Diagnosis not present

## 2013-02-07 DIAGNOSIS — M6282 Rhabdomyolysis: Secondary | ICD-10-CM | POA: Diagnosis present

## 2013-02-07 DIAGNOSIS — Z880 Allergy status to penicillin: Secondary | ICD-10-CM | POA: Diagnosis not present

## 2013-02-07 DIAGNOSIS — R197 Diarrhea, unspecified: Secondary | ICD-10-CM | POA: Diagnosis not present

## 2013-02-07 DIAGNOSIS — N179 Acute kidney failure, unspecified: Secondary | ICD-10-CM | POA: Diagnosis present

## 2013-02-07 DIAGNOSIS — Z888 Allergy status to other drugs, medicaments and biological substances status: Secondary | ICD-10-CM | POA: Diagnosis not present

## 2013-02-07 DIAGNOSIS — N19 Unspecified kidney failure: Secondary | ICD-10-CM | POA: Diagnosis not present

## 2013-02-07 DIAGNOSIS — R079 Chest pain, unspecified: Secondary | ICD-10-CM | POA: Diagnosis not present

## 2013-02-07 DIAGNOSIS — E1129 Type 2 diabetes mellitus with other diabetic kidney complication: Secondary | ICD-10-CM | POA: Diagnosis not present

## 2013-02-07 DIAGNOSIS — M5430 Sciatica, unspecified side: Secondary | ICD-10-CM | POA: Diagnosis present

## 2013-02-07 DIAGNOSIS — E876 Hypokalemia: Secondary | ICD-10-CM | POA: Diagnosis not present

## 2013-02-07 DIAGNOSIS — E1142 Type 2 diabetes mellitus with diabetic polyneuropathy: Secondary | ICD-10-CM | POA: Diagnosis present

## 2013-02-07 DIAGNOSIS — E1149 Type 2 diabetes mellitus with other diabetic neurological complication: Secondary | ICD-10-CM | POA: Diagnosis not present

## 2013-02-07 DIAGNOSIS — Z79899 Other long term (current) drug therapy: Secondary | ICD-10-CM | POA: Diagnosis not present

## 2013-02-07 DIAGNOSIS — E039 Hypothyroidism, unspecified: Secondary | ICD-10-CM | POA: Diagnosis present

## 2013-02-07 DIAGNOSIS — E785 Hyperlipidemia, unspecified: Secondary | ICD-10-CM | POA: Diagnosis present

## 2013-02-07 DIAGNOSIS — R6889 Other general symptoms and signs: Secondary | ICD-10-CM | POA: Diagnosis not present

## 2013-02-07 DIAGNOSIS — R5381 Other malaise: Secondary | ICD-10-CM | POA: Diagnosis not present

## 2013-02-13 DIAGNOSIS — N289 Disorder of kidney and ureter, unspecified: Secondary | ICD-10-CM | POA: Diagnosis not present

## 2013-02-13 DIAGNOSIS — R197 Diarrhea, unspecified: Secondary | ICD-10-CM | POA: Diagnosis not present

## 2013-02-13 DIAGNOSIS — R5383 Other fatigue: Secondary | ICD-10-CM | POA: Diagnosis not present

## 2013-02-13 DIAGNOSIS — N189 Chronic kidney disease, unspecified: Secondary | ICD-10-CM | POA: Diagnosis not present

## 2013-02-13 DIAGNOSIS — I10 Essential (primary) hypertension: Secondary | ICD-10-CM | POA: Diagnosis not present

## 2013-02-13 DIAGNOSIS — R5381 Other malaise: Secondary | ICD-10-CM | POA: Diagnosis not present

## 2013-02-14 DIAGNOSIS — I129 Hypertensive chronic kidney disease with stage 1 through stage 4 chronic kidney disease, or unspecified chronic kidney disease: Secondary | ICD-10-CM | POA: Diagnosis not present

## 2013-02-14 DIAGNOSIS — Z794 Long term (current) use of insulin: Secondary | ICD-10-CM | POA: Diagnosis not present

## 2013-02-14 DIAGNOSIS — E669 Obesity, unspecified: Secondary | ICD-10-CM | POA: Diagnosis not present

## 2013-02-14 DIAGNOSIS — E1149 Type 2 diabetes mellitus with other diabetic neurological complication: Secondary | ICD-10-CM | POA: Diagnosis not present

## 2013-02-14 DIAGNOSIS — N189 Chronic kidney disease, unspecified: Secondary | ICD-10-CM | POA: Diagnosis not present

## 2013-02-14 DIAGNOSIS — G909 Disorder of the autonomic nervous system, unspecified: Secondary | ICD-10-CM | POA: Diagnosis not present

## 2013-02-14 DIAGNOSIS — I2510 Atherosclerotic heart disease of native coronary artery without angina pectoris: Secondary | ICD-10-CM | POA: Diagnosis not present

## 2013-02-14 DIAGNOSIS — E860 Dehydration: Secondary | ICD-10-CM | POA: Diagnosis not present

## 2013-02-14 DIAGNOSIS — IMO0001 Reserved for inherently not codable concepts without codable children: Secondary | ICD-10-CM | POA: Diagnosis not present

## 2013-02-14 DIAGNOSIS — Z9181 History of falling: Secondary | ICD-10-CM | POA: Diagnosis not present

## 2013-02-14 DIAGNOSIS — G9340 Encephalopathy, unspecified: Secondary | ICD-10-CM | POA: Diagnosis not present

## 2013-02-16 DIAGNOSIS — N189 Chronic kidney disease, unspecified: Secondary | ICD-10-CM | POA: Diagnosis not present

## 2013-02-16 DIAGNOSIS — G9340 Encephalopathy, unspecified: Secondary | ICD-10-CM | POA: Diagnosis not present

## 2013-02-16 DIAGNOSIS — I129 Hypertensive chronic kidney disease with stage 1 through stage 4 chronic kidney disease, or unspecified chronic kidney disease: Secondary | ICD-10-CM | POA: Diagnosis not present

## 2013-02-16 DIAGNOSIS — E1149 Type 2 diabetes mellitus with other diabetic neurological complication: Secondary | ICD-10-CM | POA: Diagnosis not present

## 2013-02-16 DIAGNOSIS — E860 Dehydration: Secondary | ICD-10-CM | POA: Diagnosis not present

## 2013-02-16 DIAGNOSIS — G909 Disorder of the autonomic nervous system, unspecified: Secondary | ICD-10-CM | POA: Diagnosis not present

## 2013-02-18 DIAGNOSIS — G909 Disorder of the autonomic nervous system, unspecified: Secondary | ICD-10-CM | POA: Diagnosis not present

## 2013-02-18 DIAGNOSIS — N189 Chronic kidney disease, unspecified: Secondary | ICD-10-CM | POA: Diagnosis not present

## 2013-02-18 DIAGNOSIS — E860 Dehydration: Secondary | ICD-10-CM | POA: Diagnosis not present

## 2013-02-18 DIAGNOSIS — E1149 Type 2 diabetes mellitus with other diabetic neurological complication: Secondary | ICD-10-CM | POA: Diagnosis not present

## 2013-02-18 DIAGNOSIS — G9340 Encephalopathy, unspecified: Secondary | ICD-10-CM | POA: Diagnosis not present

## 2013-02-18 DIAGNOSIS — I129 Hypertensive chronic kidney disease with stage 1 through stage 4 chronic kidney disease, or unspecified chronic kidney disease: Secondary | ICD-10-CM | POA: Diagnosis not present

## 2013-02-19 DIAGNOSIS — N189 Chronic kidney disease, unspecified: Secondary | ICD-10-CM | POA: Diagnosis not present

## 2013-02-19 DIAGNOSIS — I129 Hypertensive chronic kidney disease with stage 1 through stage 4 chronic kidney disease, or unspecified chronic kidney disease: Secondary | ICD-10-CM | POA: Diagnosis not present

## 2013-02-19 DIAGNOSIS — E860 Dehydration: Secondary | ICD-10-CM | POA: Diagnosis not present

## 2013-02-19 DIAGNOSIS — E1149 Type 2 diabetes mellitus with other diabetic neurological complication: Secondary | ICD-10-CM | POA: Diagnosis not present

## 2013-02-19 DIAGNOSIS — G9340 Encephalopathy, unspecified: Secondary | ICD-10-CM | POA: Diagnosis not present

## 2013-02-19 DIAGNOSIS — G909 Disorder of the autonomic nervous system, unspecified: Secondary | ICD-10-CM | POA: Diagnosis not present

## 2013-02-20 DIAGNOSIS — E860 Dehydration: Secondary | ICD-10-CM | POA: Diagnosis not present

## 2013-02-20 DIAGNOSIS — N189 Chronic kidney disease, unspecified: Secondary | ICD-10-CM | POA: Diagnosis not present

## 2013-02-20 DIAGNOSIS — G9340 Encephalopathy, unspecified: Secondary | ICD-10-CM | POA: Diagnosis not present

## 2013-02-20 DIAGNOSIS — E1149 Type 2 diabetes mellitus with other diabetic neurological complication: Secondary | ICD-10-CM | POA: Diagnosis not present

## 2013-02-20 DIAGNOSIS — I129 Hypertensive chronic kidney disease with stage 1 through stage 4 chronic kidney disease, or unspecified chronic kidney disease: Secondary | ICD-10-CM | POA: Diagnosis not present

## 2013-02-20 DIAGNOSIS — G909 Disorder of the autonomic nervous system, unspecified: Secondary | ICD-10-CM | POA: Diagnosis not present

## 2013-02-22 DIAGNOSIS — E860 Dehydration: Secondary | ICD-10-CM | POA: Diagnosis not present

## 2013-02-22 DIAGNOSIS — N189 Chronic kidney disease, unspecified: Secondary | ICD-10-CM | POA: Diagnosis not present

## 2013-02-22 DIAGNOSIS — G909 Disorder of the autonomic nervous system, unspecified: Secondary | ICD-10-CM | POA: Diagnosis not present

## 2013-02-22 DIAGNOSIS — I129 Hypertensive chronic kidney disease with stage 1 through stage 4 chronic kidney disease, or unspecified chronic kidney disease: Secondary | ICD-10-CM | POA: Diagnosis not present

## 2013-02-22 DIAGNOSIS — E1149 Type 2 diabetes mellitus with other diabetic neurological complication: Secondary | ICD-10-CM | POA: Diagnosis not present

## 2013-02-22 DIAGNOSIS — G9340 Encephalopathy, unspecified: Secondary | ICD-10-CM | POA: Diagnosis not present

## 2013-02-25 DIAGNOSIS — E1149 Type 2 diabetes mellitus with other diabetic neurological complication: Secondary | ICD-10-CM | POA: Diagnosis not present

## 2013-02-25 DIAGNOSIS — I129 Hypertensive chronic kidney disease with stage 1 through stage 4 chronic kidney disease, or unspecified chronic kidney disease: Secondary | ICD-10-CM | POA: Diagnosis not present

## 2013-02-25 DIAGNOSIS — G909 Disorder of the autonomic nervous system, unspecified: Secondary | ICD-10-CM | POA: Diagnosis not present

## 2013-02-25 DIAGNOSIS — E860 Dehydration: Secondary | ICD-10-CM | POA: Diagnosis not present

## 2013-02-25 DIAGNOSIS — G9340 Encephalopathy, unspecified: Secondary | ICD-10-CM | POA: Diagnosis not present

## 2013-02-25 DIAGNOSIS — N189 Chronic kidney disease, unspecified: Secondary | ICD-10-CM | POA: Diagnosis not present

## 2013-02-26 DIAGNOSIS — G9340 Encephalopathy, unspecified: Secondary | ICD-10-CM | POA: Diagnosis not present

## 2013-02-26 DIAGNOSIS — I129 Hypertensive chronic kidney disease with stage 1 through stage 4 chronic kidney disease, or unspecified chronic kidney disease: Secondary | ICD-10-CM | POA: Diagnosis not present

## 2013-02-26 DIAGNOSIS — E860 Dehydration: Secondary | ICD-10-CM | POA: Diagnosis not present

## 2013-02-26 DIAGNOSIS — E1149 Type 2 diabetes mellitus with other diabetic neurological complication: Secondary | ICD-10-CM | POA: Diagnosis not present

## 2013-02-26 DIAGNOSIS — N189 Chronic kidney disease, unspecified: Secondary | ICD-10-CM | POA: Diagnosis not present

## 2013-02-26 DIAGNOSIS — G909 Disorder of the autonomic nervous system, unspecified: Secondary | ICD-10-CM | POA: Diagnosis not present

## 2013-02-27 DIAGNOSIS — E1149 Type 2 diabetes mellitus with other diabetic neurological complication: Secondary | ICD-10-CM | POA: Diagnosis not present

## 2013-02-27 DIAGNOSIS — I129 Hypertensive chronic kidney disease with stage 1 through stage 4 chronic kidney disease, or unspecified chronic kidney disease: Secondary | ICD-10-CM | POA: Diagnosis not present

## 2013-02-27 DIAGNOSIS — G9340 Encephalopathy, unspecified: Secondary | ICD-10-CM | POA: Diagnosis not present

## 2013-02-27 DIAGNOSIS — E860 Dehydration: Secondary | ICD-10-CM | POA: Diagnosis not present

## 2013-02-27 DIAGNOSIS — G909 Disorder of the autonomic nervous system, unspecified: Secondary | ICD-10-CM | POA: Diagnosis not present

## 2013-02-27 DIAGNOSIS — N189 Chronic kidney disease, unspecified: Secondary | ICD-10-CM | POA: Diagnosis not present

## 2013-02-28 DIAGNOSIS — E1149 Type 2 diabetes mellitus with other diabetic neurological complication: Secondary | ICD-10-CM | POA: Diagnosis not present

## 2013-02-28 DIAGNOSIS — G9340 Encephalopathy, unspecified: Secondary | ICD-10-CM | POA: Diagnosis not present

## 2013-02-28 DIAGNOSIS — I129 Hypertensive chronic kidney disease with stage 1 through stage 4 chronic kidney disease, or unspecified chronic kidney disease: Secondary | ICD-10-CM | POA: Diagnosis not present

## 2013-02-28 DIAGNOSIS — N189 Chronic kidney disease, unspecified: Secondary | ICD-10-CM | POA: Diagnosis not present

## 2013-02-28 DIAGNOSIS — E860 Dehydration: Secondary | ICD-10-CM | POA: Diagnosis not present

## 2013-02-28 DIAGNOSIS — G909 Disorder of the autonomic nervous system, unspecified: Secondary | ICD-10-CM | POA: Diagnosis not present

## 2013-03-01 DIAGNOSIS — N189 Chronic kidney disease, unspecified: Secondary | ICD-10-CM | POA: Diagnosis not present

## 2013-03-01 DIAGNOSIS — G9340 Encephalopathy, unspecified: Secondary | ICD-10-CM | POA: Diagnosis not present

## 2013-03-01 DIAGNOSIS — E860 Dehydration: Secondary | ICD-10-CM | POA: Diagnosis not present

## 2013-03-01 DIAGNOSIS — G909 Disorder of the autonomic nervous system, unspecified: Secondary | ICD-10-CM | POA: Diagnosis not present

## 2013-03-01 DIAGNOSIS — E1149 Type 2 diabetes mellitus with other diabetic neurological complication: Secondary | ICD-10-CM | POA: Diagnosis not present

## 2013-03-01 DIAGNOSIS — I129 Hypertensive chronic kidney disease with stage 1 through stage 4 chronic kidney disease, or unspecified chronic kidney disease: Secondary | ICD-10-CM | POA: Diagnosis not present

## 2013-03-03 DIAGNOSIS — N189 Chronic kidney disease, unspecified: Secondary | ICD-10-CM | POA: Diagnosis not present

## 2013-03-03 DIAGNOSIS — G909 Disorder of the autonomic nervous system, unspecified: Secondary | ICD-10-CM | POA: Diagnosis not present

## 2013-03-03 DIAGNOSIS — I129 Hypertensive chronic kidney disease with stage 1 through stage 4 chronic kidney disease, or unspecified chronic kidney disease: Secondary | ICD-10-CM | POA: Diagnosis not present

## 2013-03-03 DIAGNOSIS — E1149 Type 2 diabetes mellitus with other diabetic neurological complication: Secondary | ICD-10-CM | POA: Diagnosis not present

## 2013-03-03 DIAGNOSIS — E860 Dehydration: Secondary | ICD-10-CM | POA: Diagnosis not present

## 2013-03-03 DIAGNOSIS — G9340 Encephalopathy, unspecified: Secondary | ICD-10-CM | POA: Diagnosis not present

## 2013-03-04 DIAGNOSIS — N189 Chronic kidney disease, unspecified: Secondary | ICD-10-CM | POA: Diagnosis not present

## 2013-03-04 DIAGNOSIS — G9340 Encephalopathy, unspecified: Secondary | ICD-10-CM | POA: Diagnosis not present

## 2013-03-04 DIAGNOSIS — B379 Candidiasis, unspecified: Secondary | ICD-10-CM | POA: Diagnosis not present

## 2013-03-04 DIAGNOSIS — G909 Disorder of the autonomic nervous system, unspecified: Secondary | ICD-10-CM | POA: Diagnosis not present

## 2013-03-04 DIAGNOSIS — G479 Sleep disorder, unspecified: Secondary | ICD-10-CM | POA: Diagnosis not present

## 2013-03-04 DIAGNOSIS — I129 Hypertensive chronic kidney disease with stage 1 through stage 4 chronic kidney disease, or unspecified chronic kidney disease: Secondary | ICD-10-CM | POA: Diagnosis not present

## 2013-03-04 DIAGNOSIS — E1149 Type 2 diabetes mellitus with other diabetic neurological complication: Secondary | ICD-10-CM | POA: Diagnosis not present

## 2013-03-04 DIAGNOSIS — E860 Dehydration: Secondary | ICD-10-CM | POA: Diagnosis not present

## 2013-03-06 DIAGNOSIS — G9340 Encephalopathy, unspecified: Secondary | ICD-10-CM | POA: Diagnosis not present

## 2013-03-06 DIAGNOSIS — N189 Chronic kidney disease, unspecified: Secondary | ICD-10-CM | POA: Diagnosis not present

## 2013-03-06 DIAGNOSIS — G909 Disorder of the autonomic nervous system, unspecified: Secondary | ICD-10-CM | POA: Diagnosis not present

## 2013-03-06 DIAGNOSIS — E860 Dehydration: Secondary | ICD-10-CM | POA: Diagnosis not present

## 2013-03-06 DIAGNOSIS — E1149 Type 2 diabetes mellitus with other diabetic neurological complication: Secondary | ICD-10-CM | POA: Diagnosis not present

## 2013-03-06 DIAGNOSIS — I129 Hypertensive chronic kidney disease with stage 1 through stage 4 chronic kidney disease, or unspecified chronic kidney disease: Secondary | ICD-10-CM | POA: Diagnosis not present

## 2013-03-11 DIAGNOSIS — N390 Urinary tract infection, site not specified: Secondary | ICD-10-CM | POA: Diagnosis not present

## 2013-03-12 DIAGNOSIS — G909 Disorder of the autonomic nervous system, unspecified: Secondary | ICD-10-CM | POA: Diagnosis not present

## 2013-03-12 DIAGNOSIS — I129 Hypertensive chronic kidney disease with stage 1 through stage 4 chronic kidney disease, or unspecified chronic kidney disease: Secondary | ICD-10-CM | POA: Diagnosis not present

## 2013-03-12 DIAGNOSIS — E860 Dehydration: Secondary | ICD-10-CM | POA: Diagnosis not present

## 2013-03-12 DIAGNOSIS — E1149 Type 2 diabetes mellitus with other diabetic neurological complication: Secondary | ICD-10-CM | POA: Diagnosis not present

## 2013-03-12 DIAGNOSIS — N189 Chronic kidney disease, unspecified: Secondary | ICD-10-CM | POA: Diagnosis not present

## 2013-03-12 DIAGNOSIS — G9340 Encephalopathy, unspecified: Secondary | ICD-10-CM | POA: Diagnosis not present

## 2013-03-14 DIAGNOSIS — R300 Dysuria: Secondary | ICD-10-CM | POA: Diagnosis not present

## 2013-03-14 DIAGNOSIS — D510 Vitamin B12 deficiency anemia due to intrinsic factor deficiency: Secondary | ICD-10-CM | POA: Diagnosis not present

## 2013-03-14 DIAGNOSIS — E1149 Type 2 diabetes mellitus with other diabetic neurological complication: Secondary | ICD-10-CM | POA: Diagnosis not present

## 2013-03-14 DIAGNOSIS — E039 Hypothyroidism, unspecified: Secondary | ICD-10-CM | POA: Diagnosis not present

## 2013-03-14 DIAGNOSIS — E1142 Type 2 diabetes mellitus with diabetic polyneuropathy: Secondary | ICD-10-CM | POA: Diagnosis not present

## 2013-03-14 DIAGNOSIS — N189 Chronic kidney disease, unspecified: Secondary | ICD-10-CM | POA: Diagnosis not present

## 2013-03-14 DIAGNOSIS — E669 Obesity, unspecified: Secondary | ICD-10-CM | POA: Diagnosis not present

## 2013-03-15 DIAGNOSIS — N3941 Urge incontinence: Secondary | ICD-10-CM | POA: Diagnosis not present

## 2013-03-15 DIAGNOSIS — N390 Urinary tract infection, site not specified: Secondary | ICD-10-CM | POA: Diagnosis not present

## 2013-03-25 DIAGNOSIS — N189 Chronic kidney disease, unspecified: Secondary | ICD-10-CM | POA: Diagnosis not present

## 2013-03-25 DIAGNOSIS — I129 Hypertensive chronic kidney disease with stage 1 through stage 4 chronic kidney disease, or unspecified chronic kidney disease: Secondary | ICD-10-CM | POA: Diagnosis not present

## 2013-03-25 DIAGNOSIS — E1149 Type 2 diabetes mellitus with other diabetic neurological complication: Secondary | ICD-10-CM | POA: Diagnosis not present

## 2013-03-25 DIAGNOSIS — G9340 Encephalopathy, unspecified: Secondary | ICD-10-CM | POA: Diagnosis not present

## 2013-03-25 DIAGNOSIS — E860 Dehydration: Secondary | ICD-10-CM | POA: Diagnosis not present

## 2013-03-25 DIAGNOSIS — G909 Disorder of the autonomic nervous system, unspecified: Secondary | ICD-10-CM | POA: Diagnosis not present

## 2013-03-29 DIAGNOSIS — Z79899 Other long term (current) drug therapy: Secondary | ICD-10-CM | POA: Diagnosis not present

## 2013-03-29 DIAGNOSIS — G894 Chronic pain syndrome: Secondary | ICD-10-CM | POA: Diagnosis not present

## 2013-03-29 DIAGNOSIS — M5137 Other intervertebral disc degeneration, lumbosacral region: Secondary | ICD-10-CM | POA: Diagnosis not present

## 2013-03-29 DIAGNOSIS — M5030 Other cervical disc degeneration, unspecified cervical region: Secondary | ICD-10-CM | POA: Diagnosis not present

## 2013-04-04 ENCOUNTER — Other Ambulatory Visit: Payer: Self-pay | Admitting: Pain Medicine

## 2013-04-04 DIAGNOSIS — M549 Dorsalgia, unspecified: Secondary | ICD-10-CM

## 2013-04-08 ENCOUNTER — Inpatient Hospital Stay: Admission: RE | Admit: 2013-04-08 | Payer: Medicare Other | Source: Ambulatory Visit

## 2013-04-08 DIAGNOSIS — N3941 Urge incontinence: Secondary | ICD-10-CM | POA: Diagnosis not present

## 2013-04-08 DIAGNOSIS — N390 Urinary tract infection, site not specified: Secondary | ICD-10-CM | POA: Diagnosis not present

## 2013-04-10 ENCOUNTER — Ambulatory Visit
Admission: RE | Admit: 2013-04-10 | Discharge: 2013-04-10 | Disposition: A | Discharge status: Home / Self Care | Payer: Medicare Other | Source: Ambulatory Visit | Attending: Pain Medicine | Admitting: Pain Medicine

## 2013-04-10 DIAGNOSIS — G909 Disorder of the autonomic nervous system, unspecified: Secondary | ICD-10-CM | POA: Diagnosis not present

## 2013-04-10 DIAGNOSIS — G9340 Encephalopathy, unspecified: Secondary | ICD-10-CM | POA: Diagnosis not present

## 2013-04-10 DIAGNOSIS — E1149 Type 2 diabetes mellitus with other diabetic neurological complication: Secondary | ICD-10-CM | POA: Diagnosis not present

## 2013-04-10 DIAGNOSIS — N189 Chronic kidney disease, unspecified: Secondary | ICD-10-CM | POA: Diagnosis not present

## 2013-04-10 DIAGNOSIS — I129 Hypertensive chronic kidney disease with stage 1 through stage 4 chronic kidney disease, or unspecified chronic kidney disease: Secondary | ICD-10-CM | POA: Diagnosis not present

## 2013-04-10 DIAGNOSIS — M549 Dorsalgia, unspecified: Secondary | ICD-10-CM

## 2013-04-10 DIAGNOSIS — E860 Dehydration: Secondary | ICD-10-CM | POA: Diagnosis not present

## 2013-04-10 DIAGNOSIS — M5126 Other intervertebral disc displacement, lumbar region: Secondary | ICD-10-CM | POA: Diagnosis not present

## 2013-04-10 DIAGNOSIS — M47817 Spondylosis without myelopathy or radiculopathy, lumbosacral region: Secondary | ICD-10-CM | POA: Diagnosis not present

## 2013-04-19 DIAGNOSIS — G894 Chronic pain syndrome: Secondary | ICD-10-CM | POA: Diagnosis not present

## 2013-04-19 DIAGNOSIS — M461 Sacroiliitis, not elsewhere classified: Secondary | ICD-10-CM | POA: Diagnosis not present

## 2013-04-19 DIAGNOSIS — Z79899 Other long term (current) drug therapy: Secondary | ICD-10-CM | POA: Diagnosis not present

## 2013-04-19 DIAGNOSIS — M5137 Other intervertebral disc degeneration, lumbosacral region: Secondary | ICD-10-CM | POA: Diagnosis not present

## 2013-04-29 DIAGNOSIS — G894 Chronic pain syndrome: Secondary | ICD-10-CM | POA: Diagnosis not present

## 2013-04-29 DIAGNOSIS — M5137 Other intervertebral disc degeneration, lumbosacral region: Secondary | ICD-10-CM | POA: Diagnosis not present

## 2013-04-29 DIAGNOSIS — M461 Sacroiliitis, not elsewhere classified: Secondary | ICD-10-CM | POA: Diagnosis not present

## 2013-04-29 DIAGNOSIS — Z79899 Other long term (current) drug therapy: Secondary | ICD-10-CM | POA: Diagnosis not present

## 2013-05-13 DIAGNOSIS — M79609 Pain in unspecified limb: Secondary | ICD-10-CM | POA: Diagnosis not present

## 2013-05-13 DIAGNOSIS — M159 Polyosteoarthritis, unspecified: Secondary | ICD-10-CM | POA: Diagnosis not present

## 2013-05-13 DIAGNOSIS — IMO0002 Reserved for concepts with insufficient information to code with codable children: Secondary | ICD-10-CM | POA: Diagnosis not present

## 2013-05-13 DIAGNOSIS — IMO0001 Reserved for inherently not codable concepts without codable children: Secondary | ICD-10-CM | POA: Diagnosis not present

## 2013-05-14 DIAGNOSIS — M5137 Other intervertebral disc degeneration, lumbosacral region: Secondary | ICD-10-CM | POA: Diagnosis not present

## 2013-05-17 DIAGNOSIS — Z1231 Encounter for screening mammogram for malignant neoplasm of breast: Secondary | ICD-10-CM | POA: Diagnosis not present

## 2013-05-17 DIAGNOSIS — N390 Urinary tract infection, site not specified: Secondary | ICD-10-CM | POA: Diagnosis not present

## 2013-05-27 DIAGNOSIS — G894 Chronic pain syndrome: Secondary | ICD-10-CM | POA: Diagnosis not present

## 2013-05-27 DIAGNOSIS — M76899 Other specified enthesopathies of unspecified lower limb, excluding foot: Secondary | ICD-10-CM | POA: Diagnosis not present

## 2013-05-27 DIAGNOSIS — M5137 Other intervertebral disc degeneration, lumbosacral region: Secondary | ICD-10-CM | POA: Diagnosis not present

## 2013-05-27 DIAGNOSIS — G5600 Carpal tunnel syndrome, unspecified upper limb: Secondary | ICD-10-CM | POA: Diagnosis not present

## 2013-06-05 DIAGNOSIS — G5600 Carpal tunnel syndrome, unspecified upper limb: Secondary | ICD-10-CM | POA: Diagnosis not present

## 2013-06-19 DIAGNOSIS — M76899 Other specified enthesopathies of unspecified lower limb, excluding foot: Secondary | ICD-10-CM | POA: Diagnosis not present

## 2013-07-04 DIAGNOSIS — N952 Postmenopausal atrophic vaginitis: Secondary | ICD-10-CM | POA: Diagnosis not present

## 2013-07-04 DIAGNOSIS — R82998 Other abnormal findings in urine: Secondary | ICD-10-CM | POA: Diagnosis not present

## 2013-07-04 DIAGNOSIS — N390 Urinary tract infection, site not specified: Secondary | ICD-10-CM | POA: Diagnosis not present

## 2013-07-08 DIAGNOSIS — N390 Urinary tract infection, site not specified: Secondary | ICD-10-CM | POA: Diagnosis not present

## 2013-07-22 DIAGNOSIS — G894 Chronic pain syndrome: Secondary | ICD-10-CM | POA: Diagnosis not present

## 2013-07-22 DIAGNOSIS — Z79899 Other long term (current) drug therapy: Secondary | ICD-10-CM | POA: Diagnosis not present

## 2013-07-22 DIAGNOSIS — M5137 Other intervertebral disc degeneration, lumbosacral region: Secondary | ICD-10-CM | POA: Diagnosis not present

## 2013-07-22 DIAGNOSIS — M76899 Other specified enthesopathies of unspecified lower limb, excluding foot: Secondary | ICD-10-CM | POA: Diagnosis not present

## 2013-07-23 DIAGNOSIS — I10 Essential (primary) hypertension: Secondary | ICD-10-CM | POA: Diagnosis not present

## 2013-07-23 DIAGNOSIS — Z794 Long term (current) use of insulin: Secondary | ICD-10-CM | POA: Diagnosis not present

## 2013-07-23 DIAGNOSIS — N183 Chronic kidney disease, stage 3 unspecified: Secondary | ICD-10-CM | POA: Diagnosis not present

## 2013-07-23 DIAGNOSIS — M5430 Sciatica, unspecified side: Secondary | ICD-10-CM | POA: Diagnosis not present

## 2013-07-23 DIAGNOSIS — Z888 Allergy status to other drugs, medicaments and biological substances status: Secondary | ICD-10-CM | POA: Diagnosis not present

## 2013-07-23 DIAGNOSIS — R079 Chest pain, unspecified: Secondary | ICD-10-CM | POA: Diagnosis not present

## 2013-07-23 DIAGNOSIS — I129 Hypertensive chronic kidney disease with stage 1 through stage 4 chronic kidney disease, or unspecified chronic kidney disease: Secondary | ICD-10-CM | POA: Diagnosis not present

## 2013-07-23 DIAGNOSIS — R52 Pain, unspecified: Secondary | ICD-10-CM | POA: Diagnosis not present

## 2013-07-23 DIAGNOSIS — IMO0001 Reserved for inherently not codable concepts without codable children: Secondary | ICD-10-CM | POA: Diagnosis not present

## 2013-07-23 DIAGNOSIS — G8929 Other chronic pain: Secondary | ICD-10-CM | POA: Insufficient documentation

## 2013-07-23 DIAGNOSIS — Z8249 Family history of ischemic heart disease and other diseases of the circulatory system: Secondary | ICD-10-CM | POA: Diagnosis not present

## 2013-07-23 DIAGNOSIS — Z9089 Acquired absence of other organs: Secondary | ICD-10-CM | POA: Diagnosis not present

## 2013-07-23 DIAGNOSIS — Z809 Family history of malignant neoplasm, unspecified: Secondary | ICD-10-CM | POA: Diagnosis not present

## 2013-07-23 DIAGNOSIS — M5137 Other intervertebral disc degeneration, lumbosacral region: Secondary | ICD-10-CM | POA: Diagnosis not present

## 2013-07-23 DIAGNOSIS — Z881 Allergy status to other antibiotic agents status: Secondary | ICD-10-CM | POA: Diagnosis not present

## 2013-07-23 DIAGNOSIS — E039 Hypothyroidism, unspecified: Secondary | ICD-10-CM | POA: Diagnosis not present

## 2013-07-23 DIAGNOSIS — Z7982 Long term (current) use of aspirin: Secondary | ICD-10-CM | POA: Diagnosis not present

## 2013-07-23 DIAGNOSIS — Z79899 Other long term (current) drug therapy: Secondary | ICD-10-CM | POA: Diagnosis not present

## 2013-07-23 DIAGNOSIS — Z880 Allergy status to penicillin: Secondary | ICD-10-CM | POA: Diagnosis not present

## 2013-07-23 DIAGNOSIS — R0602 Shortness of breath: Secondary | ICD-10-CM | POA: Diagnosis not present

## 2013-07-23 DIAGNOSIS — M797 Fibromyalgia: Secondary | ICD-10-CM | POA: Insufficient documentation

## 2013-07-23 DIAGNOSIS — IMO0002 Reserved for concepts with insufficient information to code with codable children: Secondary | ICD-10-CM | POA: Diagnosis not present

## 2013-07-23 DIAGNOSIS — E119 Type 2 diabetes mellitus without complications: Secondary | ICD-10-CM | POA: Diagnosis not present

## 2013-07-24 DIAGNOSIS — G8929 Other chronic pain: Secondary | ICD-10-CM | POA: Diagnosis not present

## 2013-07-24 DIAGNOSIS — N183 Chronic kidney disease, stage 3 unspecified: Secondary | ICD-10-CM | POA: Diagnosis not present

## 2013-07-24 DIAGNOSIS — R079 Chest pain, unspecified: Secondary | ICD-10-CM | POA: Diagnosis not present

## 2013-07-24 DIAGNOSIS — E119 Type 2 diabetes mellitus without complications: Secondary | ICD-10-CM | POA: Diagnosis not present

## 2013-07-30 DIAGNOSIS — R071 Chest pain on breathing: Secondary | ICD-10-CM | POA: Diagnosis not present

## 2013-08-02 DIAGNOSIS — R071 Chest pain on breathing: Secondary | ICD-10-CM | POA: Diagnosis not present

## 2013-08-07 DIAGNOSIS — E1129 Type 2 diabetes mellitus with other diabetic kidney complication: Secondary | ICD-10-CM | POA: Diagnosis not present

## 2013-08-07 DIAGNOSIS — N183 Chronic kidney disease, stage 3 unspecified: Secondary | ICD-10-CM | POA: Diagnosis not present

## 2013-08-07 DIAGNOSIS — E119 Type 2 diabetes mellitus without complications: Secondary | ICD-10-CM | POA: Diagnosis not present

## 2013-08-07 DIAGNOSIS — I120 Hypertensive chronic kidney disease with stage 5 chronic kidney disease or end stage renal disease: Secondary | ICD-10-CM | POA: Diagnosis not present

## 2013-08-19 DIAGNOSIS — M5137 Other intervertebral disc degeneration, lumbosacral region: Secondary | ICD-10-CM | POA: Diagnosis not present

## 2013-08-19 DIAGNOSIS — Z79899 Other long term (current) drug therapy: Secondary | ICD-10-CM | POA: Diagnosis not present

## 2013-08-19 DIAGNOSIS — G894 Chronic pain syndrome: Secondary | ICD-10-CM | POA: Diagnosis not present

## 2013-08-19 DIAGNOSIS — M76899 Other specified enthesopathies of unspecified lower limb, excluding foot: Secondary | ICD-10-CM | POA: Diagnosis not present

## 2013-09-05 DIAGNOSIS — I10 Essential (primary) hypertension: Secondary | ICD-10-CM | POA: Diagnosis not present

## 2013-09-05 DIAGNOSIS — E1149 Type 2 diabetes mellitus with other diabetic neurological complication: Secondary | ICD-10-CM | POA: Diagnosis not present

## 2013-09-05 DIAGNOSIS — D510 Vitamin B12 deficiency anemia due to intrinsic factor deficiency: Secondary | ICD-10-CM | POA: Diagnosis not present

## 2013-09-05 DIAGNOSIS — Z23 Encounter for immunization: Secondary | ICD-10-CM | POA: Diagnosis not present

## 2013-09-05 DIAGNOSIS — E538 Deficiency of other specified B group vitamins: Secondary | ICD-10-CM | POA: Diagnosis not present

## 2013-09-05 DIAGNOSIS — E559 Vitamin D deficiency, unspecified: Secondary | ICD-10-CM | POA: Diagnosis not present

## 2013-09-05 DIAGNOSIS — E782 Mixed hyperlipidemia: Secondary | ICD-10-CM | POA: Diagnosis not present

## 2013-09-05 DIAGNOSIS — E1142 Type 2 diabetes mellitus with diabetic polyneuropathy: Secondary | ICD-10-CM | POA: Diagnosis not present

## 2013-09-05 DIAGNOSIS — E039 Hypothyroidism, unspecified: Secondary | ICD-10-CM | POA: Diagnosis not present

## 2013-09-05 DIAGNOSIS — Z0000 Encounter for general adult medical examination without abnormal findings: Secondary | ICD-10-CM | POA: Diagnosis not present

## 2013-09-16 DIAGNOSIS — M5137 Other intervertebral disc degeneration, lumbosacral region: Secondary | ICD-10-CM | POA: Diagnosis not present

## 2013-10-10 ENCOUNTER — Other Ambulatory Visit: Payer: Self-pay | Admitting: Pain Medicine

## 2013-10-10 DIAGNOSIS — M545 Low back pain, unspecified: Secondary | ICD-10-CM

## 2013-10-11 ENCOUNTER — Ambulatory Visit
Admission: RE | Admit: 2013-10-11 | Discharge: 2013-10-11 | Disposition: A | Discharge status: Home / Self Care | Payer: Medicare Other | Source: Ambulatory Visit | Attending: Pain Medicine | Admitting: Pain Medicine

## 2013-10-11 DIAGNOSIS — M545 Low back pain, unspecified: Secondary | ICD-10-CM

## 2013-10-11 DIAGNOSIS — M47817 Spondylosis without myelopathy or radiculopathy, lumbosacral region: Secondary | ICD-10-CM | POA: Diagnosis not present

## 2013-10-11 DIAGNOSIS — M48061 Spinal stenosis, lumbar region without neurogenic claudication: Secondary | ICD-10-CM | POA: Diagnosis not present

## 2013-10-11 DIAGNOSIS — M5126 Other intervertebral disc displacement, lumbar region: Secondary | ICD-10-CM | POA: Diagnosis not present

## 2013-10-11 DIAGNOSIS — M5137 Other intervertebral disc degeneration, lumbosacral region: Secondary | ICD-10-CM | POA: Diagnosis not present

## 2013-10-14 DIAGNOSIS — M5137 Other intervertebral disc degeneration, lumbosacral region: Secondary | ICD-10-CM | POA: Diagnosis not present

## 2013-10-14 DIAGNOSIS — Z79899 Other long term (current) drug therapy: Secondary | ICD-10-CM | POA: Diagnosis not present

## 2013-10-14 DIAGNOSIS — M76899 Other specified enthesopathies of unspecified lower limb, excluding foot: Secondary | ICD-10-CM | POA: Diagnosis not present

## 2013-10-14 DIAGNOSIS — M47817 Spondylosis without myelopathy or radiculopathy, lumbosacral region: Secondary | ICD-10-CM | POA: Diagnosis not present

## 2013-10-14 DIAGNOSIS — G894 Chronic pain syndrome: Secondary | ICD-10-CM | POA: Diagnosis not present

## 2013-10-14 DIAGNOSIS — M79609 Pain in unspecified limb: Secondary | ICD-10-CM | POA: Diagnosis not present

## 2013-10-14 DIAGNOSIS — M5030 Other cervical disc degeneration, unspecified cervical region: Secondary | ICD-10-CM | POA: Diagnosis not present

## 2013-10-16 DIAGNOSIS — M76899 Other specified enthesopathies of unspecified lower limb, excluding foot: Secondary | ICD-10-CM | POA: Diagnosis not present

## 2013-10-20 ENCOUNTER — Encounter: Payer: Self-pay | Admitting: Emergency Medicine

## 2013-10-20 ENCOUNTER — Emergency Department (INDEPENDENT_AMBULATORY_CARE_PROVIDER_SITE_OTHER): Payer: Medicare Other

## 2013-10-20 ENCOUNTER — Emergency Department (INDEPENDENT_AMBULATORY_CARE_PROVIDER_SITE_OTHER)
Admission: EM | Admit: 2013-10-20 | Discharge: 2013-10-20 | Disposition: A | Discharge status: Home / Self Care | Payer: Medicare Other | Source: Home / Self Care | Attending: Family Medicine | Admitting: Family Medicine

## 2013-10-20 DIAGNOSIS — S79929A Unspecified injury of unspecified thigh, initial encounter: Secondary | ICD-10-CM | POA: Diagnosis not present

## 2013-10-20 DIAGNOSIS — M25559 Pain in unspecified hip: Secondary | ICD-10-CM | POA: Diagnosis not present

## 2013-10-20 DIAGNOSIS — S7000XA Contusion of unspecified hip, initial encounter: Secondary | ICD-10-CM | POA: Diagnosis not present

## 2013-10-20 DIAGNOSIS — S7002XA Contusion of left hip, initial encounter: Secondary | ICD-10-CM

## 2013-10-20 DIAGNOSIS — S79919A Unspecified injury of unspecified hip, initial encounter: Secondary | ICD-10-CM | POA: Diagnosis not present

## 2013-10-20 MED ORDER — METAXALONE 800 MG PO TABS: ORAL_TABLET | ORAL | Status: DC

## 2013-10-20 NOTE — Discharge Instructions (Signed)
Apply ice pack two to three times daily until pain decreases.   Contusion A contusion is a deep bruise. Contusions are the result of an injury that caused bleeding under the skin. The contusion may turn blue, purple, or yellow. Minor injuries will give you a painless contusion, but more severe contusions may stay painful and swollen for a few weeks.  CAUSES  A contusion is usually caused by a blow, trauma, or direct force to an area of the body. SYMPTOMS   Swelling and redness of the injured area.  Bruising of the injured area.  Tenderness and soreness of the injured area.  Pain. DIAGNOSIS  The diagnosis can be made by taking a history and physical exam. An X-ray, CT scan, or MRI may be needed to determine if there were any associated injuries, such as fractures. TREATMENT  Specific treatment will depend on what area of the body was injured. In general, the best treatment for a contusion is resting, icing, elevating, and applying cold compresses to the injured area. Over-the-counter medicines may also be recommended for pain control. Ask your caregiver what the best treatment is for your contusion. HOME CARE INSTRUCTIONS   Put ice on the injured area.  Put ice in a plastic bag.  Place a towel between your skin and the bag.  Leave the ice on for 15-20 minutes, 03-04 times a day.  Only take over-the-counter or prescription medicines for pain, discomfort, or fever as directed by your caregiver. Your caregiver may recommend avoiding anti-inflammatory medicines (aspirin, ibuprofen, and naproxen) for 48 hours because these medicines may increase bruising.  Rest the injured area.  If possible, elevate the injured area to reduce swelling. SEEK IMMEDIATE MEDICAL CARE IF:   You have increased bruising or swelling.  You have pain that is getting worse.  Your swelling or pain is not relieved with medicines. MAKE SURE YOU:   Understand these instructions.  Will watch your  condition.  Will get help right away if you are not doing well or get worse. Document Released: 02/23/2005 Document Revised: 08/08/2011 Document Reviewed: 03/21/2011 [facility] Patient Information 2014 [place], [place].

## 2013-10-20 NOTE — ED Notes (Signed)
Tripped over threshold of door 7 days ago and now hip continues to ache along with lower back. Left ankle was tender also, but this has basically resolved. Took Tramadol 50mg  po at 1300 today.

## 2013-10-20 NOTE — ED Provider Notes (Signed)
CSN: [id number]     Arrival date & time 10/20/13  1458 History   First MD Initiated Contact with Patient 10/20/13 1521     Chief Complaint  Patient presents with  . Hip Pain      HPI Comments: Patient tripped on a door threshold one week ago, landing on her left hip.  The pain has increased gradually over the past several days, but does not radiate.  She has a history of low back pain and had a discogram 5 days ago.  She also has a history of left hip bursitis.  She had a steroid injection to her left trochanteric bursa four days ago but there has been no improvement in her pain.                                                                                                                                                                                                                                    Patient is a 73 y.o. female presenting with hip pain. The history is provided by the patient and the spouse.  Hip Pain This is a new problem. Episode onset: 1 week ago. The problem occurs constantly. The problem has been gradually worsening. Pertinent negatives include no abdominal pain. The symptoms are aggravated by walking. Nothing relieves the symptoms. Treatments tried: Tramadol. The treatment provided mild relief.    Past Medical History  Diagnosis Date  . CAD (coronary artery disease)   . Diabetes mellitus   . Obesity   . Fibromyalgia   . Family hx of colon cancer   . Depression   . Hypertension   . Obesity   . GERD (gastroesophageal reflux disease)     pt denies  . Chronic kidney disease     low kidney function; BUN was high acc to pt  . PONV (postoperative nausea and vomiting)     with stapedectomy   Past Surgical History  Procedure Laterality Date  . Cholecystectomy    . Dilation and curettage of uterus    . Tubal ligation    . Stapedectomy    . Shoulder surgery      bilateral rotator cuff repairs   Family History  Problem Relation Age of Onset  . Colon cancer  Brother   . Diabetes Mother     and Sister   . Uterine cancer Sister    History  Substance Use Topics  . Smoking  status: Never Smoker   . Smokeless tobacco: Never Used  . Alcohol Use: No   OB History   Grav Para Term Preterm Abortions TAB SAB Ect Mult Living                 Review of Systems  Gastrointestinal: Negative for abdominal pain.  All other systems reviewed and are negative.   Allergies  Onglyza; Polysporin; Cephalosporins; Lisinopril; Penicillins; Victoza; and Mucinex  Home Medications   Prior to Admission medications   Medication Sig Start Date End Date Taking? Authorizing Provider  amLODipine (NORVASC) 10 MG tablet Take 5 mg by mouth every morning. Take 1/2 tablet daily    Historical Provider, MD  aspirin EC 81 MG tablet Take 81 mg by mouth every evening.    Historical Provider, MD  B Complex-C (B-COMPLEX WITH VITAMIN C) tablet Take 1 tablet by mouth every evening.    Historical Provider, MD  carvedilol (COREG) 6.25 MG tablet Take 6.25 mg by mouth 2 (two) times daily with a meal.    Historical Provider, MD  Cranberry 405 MG CAPS Take 1 capsule by mouth daily.    Historical Provider, MD  Cyanocobalamin (VITAMIN B-12 IJ) Inject 1,000 mg as directed every 30 (thirty) days.    Historical Provider, MD  fluticasone (FLONASE) 50 MCG/ACT nasal spray Place 2 sprays into the nose daily.    Historical Provider, MD  gabapentin (NEURONTIN) 400 MG capsule Take 1 capsule (400 mg total) by mouth 3 (three) times daily. 02/06/13   Domenic Polite, MD  HYDROcodone-acetaminophen (NORCO) 10-325 MG per tablet Take 1 tablet by mouth every 6 (six) hours as needed for pain (back pain).    Historical Provider, MD  insulin detemir (LEVEMIR) 100 UNIT/ML injection Inject 64 Units into the skin daily.     Historical Provider, MD  levothyroxine (SYNTHROID, LEVOTHROID) 137 MCG tablet Take 137 mcg by mouth every morning.     Historical Provider, MD  mometasone (ELOCON) 0.1 % cream Apply 1 application  topically daily as needed (Uses instead of neosprorin).    Historical Provider, MD  Multiple Vitamin (MULTIVITAMIN) tablet Take 1 tablet by mouth every evening.     Historical Provider, MD  nortriptyline (PAMELOR) 25 MG capsule Take 2 capsules (50 mg total) by mouth at bedtime. 02/06/13   Domenic Polite, MD  oxybutynin (DITROPAN-XL) 10 MG 24 hr tablet Take 10 mg by mouth every evening.    Historical Provider, MD  potassium chloride (K-DUR,KLOR-CON) 10 MEQ tablet Take 10 mEq by mouth every evening.    Historical Provider, MD  pravastatin (PRAVACHOL) 80 MG tablet Take 80 mg by mouth every evening.  12/03/10   Historical Provider, MD  sertraline (ZOLOFT) 100 MG tablet Take 100 mg by mouth every morning.     Historical Provider, MD  trimethoprim (TRIMPEX) 100 MG tablet Take 100 mg by mouth daily.    Historical Provider, MD  Vitamin D, Ergocalciferol, (DRISDOL) 50000 UNITS CAPS Take 50,000 Units by mouth every 30 (thirty) days.  09/27/10   Historical Provider, MD   BP 120/72  Pulse 53  Temp(Src) 97.9 F (36.6 C) (Oral)  Ht 5\' 3"  (1.6 m)  Wt 218 lb (98.884 kg)  BMI 38.63 kg/m2  SpO2 95% Physical Exam  Nursing note and vitals reviewed. Constitutional: She is oriented to person, place, and time. She appears well-developed and well-nourished. No distress.  Patient is obese (BMI 38.6)  HENT:  Head: Atraumatic.  Eyes: Conjunctivae are normal. Pupils are equal, round, and  reactive to light.  Neck: Normal range of motion.  Cardiovascular: Normal heart sounds.   Pulmonary/Chest: Breath sounds normal.  Abdominal: There is no tenderness.  Musculoskeletal:       Left hip: She exhibits decreased range of motion, decreased strength, tenderness and bony tenderness. She exhibits no swelling, no crepitus, no deformity and no laceration.       Legs: Left hip has good range of motion to flexion and extension, but mild pain with internal/external rotation.   Neurological: She is alert and oriented to person,  place, and time.  Skin: Skin is warm and dry.    ED Course  Procedures  none      Imaging Review Dg Hip Complete Left  10/20/2013   CLINICAL DATA:  Left hip pain after fall 1 week ago  EXAM: LEFT HIP - COMPLETE 2+ VIEW  COMPARISON:  None.  FINDINGS: There is no evidence of hip fracture or dislocation. There is no evidence of arthropathy or other focal bone abnormality.  IMPRESSION: Negative.   Electronically Signed   By: Rolla Flatten M.D.   On: 10/20/2013 16:06     MDM   1. Contusion of left hip    Begin Skelaxin at bedtime. Apply ice pack two to three times daily until pain decreases. Followup with PCP    Kandra Nicolas, MD 10/24/13 [phone]

## 2013-10-30 DIAGNOSIS — R3915 Urgency of urination: Secondary | ICD-10-CM | POA: Diagnosis not present

## 2013-10-30 DIAGNOSIS — N3941 Urge incontinence: Secondary | ICD-10-CM | POA: Diagnosis not present

## 2013-11-05 DIAGNOSIS — IMO0002 Reserved for concepts with insufficient information to code with codable children: Secondary | ICD-10-CM | POA: Diagnosis not present

## 2013-11-05 DIAGNOSIS — M79609 Pain in unspecified limb: Secondary | ICD-10-CM | POA: Diagnosis not present

## 2013-11-13 DIAGNOSIS — G609 Hereditary and idiopathic neuropathy, unspecified: Secondary | ICD-10-CM | POA: Diagnosis not present

## 2013-11-13 DIAGNOSIS — M76899 Other specified enthesopathies of unspecified lower limb, excluding foot: Secondary | ICD-10-CM | POA: Diagnosis not present

## 2013-11-13 DIAGNOSIS — M5137 Other intervertebral disc degeneration, lumbosacral region: Secondary | ICD-10-CM | POA: Diagnosis not present

## 2013-11-13 DIAGNOSIS — G894 Chronic pain syndrome: Secondary | ICD-10-CM | POA: Diagnosis not present

## 2013-11-14 DIAGNOSIS — B370 Candidal stomatitis: Secondary | ICD-10-CM | POA: Diagnosis not present

## 2013-11-14 DIAGNOSIS — M940 Chondrocostal junction syndrome [Tietze]: Secondary | ICD-10-CM | POA: Diagnosis not present

## 2013-12-09 DIAGNOSIS — E162 Hypoglycemia, unspecified: Secondary | ICD-10-CM | POA: Diagnosis not present

## 2013-12-09 DIAGNOSIS — E1149 Type 2 diabetes mellitus with other diabetic neurological complication: Secondary | ICD-10-CM | POA: Diagnosis not present

## 2013-12-11 DIAGNOSIS — M76899 Other specified enthesopathies of unspecified lower limb, excluding foot: Secondary | ICD-10-CM | POA: Diagnosis not present

## 2014-01-06 DIAGNOSIS — G894 Chronic pain syndrome: Secondary | ICD-10-CM | POA: Diagnosis not present

## 2014-01-06 DIAGNOSIS — M5137 Other intervertebral disc degeneration, lumbosacral region: Secondary | ICD-10-CM | POA: Diagnosis not present

## 2014-01-06 DIAGNOSIS — Z79899 Other long term (current) drug therapy: Secondary | ICD-10-CM | POA: Diagnosis not present

## 2014-01-06 DIAGNOSIS — M47817 Spondylosis without myelopathy or radiculopathy, lumbosacral region: Secondary | ICD-10-CM | POA: Diagnosis not present

## 2014-01-13 DIAGNOSIS — M5030 Other cervical disc degeneration, unspecified cervical region: Secondary | ICD-10-CM | POA: Diagnosis not present

## 2014-01-13 DIAGNOSIS — M79609 Pain in unspecified limb: Secondary | ICD-10-CM | POA: Diagnosis not present

## 2014-01-13 DIAGNOSIS — G894 Chronic pain syndrome: Secondary | ICD-10-CM | POA: Diagnosis not present

## 2014-01-14 ENCOUNTER — Other Ambulatory Visit: Payer: Self-pay | Admitting: Pain Medicine

## 2014-01-14 DIAGNOSIS — M542 Cervicalgia: Secondary | ICD-10-CM | POA: Diagnosis not present

## 2014-01-14 DIAGNOSIS — M79609 Pain in unspecified limb: Secondary | ICD-10-CM | POA: Diagnosis not present

## 2014-01-15 ENCOUNTER — Ambulatory Visit
Admission: RE | Admit: 2014-01-15 | Discharge: 2014-01-15 | Disposition: A | Discharge status: Home / Self Care | Payer: Medicare Other | Source: Ambulatory Visit | Attending: Pain Medicine | Admitting: Pain Medicine

## 2014-01-15 DIAGNOSIS — M542 Cervicalgia: Secondary | ICD-10-CM

## 2014-01-15 DIAGNOSIS — M47812 Spondylosis without myelopathy or radiculopathy, cervical region: Secondary | ICD-10-CM | POA: Diagnosis not present

## 2014-01-16 DIAGNOSIS — H2510 Age-related nuclear cataract, unspecified eye: Secondary | ICD-10-CM | POA: Diagnosis not present

## 2014-01-16 DIAGNOSIS — E119 Type 2 diabetes mellitus without complications: Secondary | ICD-10-CM | POA: Diagnosis not present

## 2014-01-27 DIAGNOSIS — M79609 Pain in unspecified limb: Secondary | ICD-10-CM | POA: Diagnosis not present

## 2014-01-27 DIAGNOSIS — M5030 Other cervical disc degeneration, unspecified cervical region: Secondary | ICD-10-CM | POA: Diagnosis not present

## 2014-01-27 DIAGNOSIS — G894 Chronic pain syndrome: Secondary | ICD-10-CM | POA: Diagnosis not present

## 2014-01-27 DIAGNOSIS — M5412 Radiculopathy, cervical region: Secondary | ICD-10-CM | POA: Diagnosis not present

## 2014-02-10 DIAGNOSIS — M545 Low back pain, unspecified: Secondary | ICD-10-CM | POA: Diagnosis not present

## 2014-02-10 DIAGNOSIS — M5137 Other intervertebral disc degeneration, lumbosacral region: Secondary | ICD-10-CM | POA: Diagnosis not present

## 2014-02-10 DIAGNOSIS — M542 Cervicalgia: Secondary | ICD-10-CM | POA: Diagnosis not present

## 2014-02-10 DIAGNOSIS — M47812 Spondylosis without myelopathy or radiculopathy, cervical region: Secondary | ICD-10-CM | POA: Diagnosis not present

## 2014-02-11 DIAGNOSIS — N390 Urinary tract infection, site not specified: Secondary | ICD-10-CM | POA: Diagnosis not present

## 2014-02-11 DIAGNOSIS — Z79899 Other long term (current) drug therapy: Secondary | ICD-10-CM | POA: Diagnosis not present

## 2014-02-11 DIAGNOSIS — M531 Cervicobrachial syndrome: Secondary | ICD-10-CM | POA: Diagnosis not present

## 2014-02-11 DIAGNOSIS — N898 Other specified noninflammatory disorders of vagina: Secondary | ICD-10-CM | POA: Diagnosis not present

## 2014-02-11 DIAGNOSIS — N760 Acute vaginitis: Secondary | ICD-10-CM | POA: Diagnosis not present

## 2014-02-11 DIAGNOSIS — M5137 Other intervertebral disc degeneration, lumbosacral region: Secondary | ICD-10-CM | POA: Diagnosis not present

## 2014-02-11 DIAGNOSIS — G894 Chronic pain syndrome: Secondary | ICD-10-CM | POA: Diagnosis not present

## 2014-02-11 DIAGNOSIS — R300 Dysuria: Secondary | ICD-10-CM | POA: Diagnosis not present

## 2014-02-19 DIAGNOSIS — M5030 Other cervical disc degeneration, unspecified cervical region: Secondary | ICD-10-CM | POA: Diagnosis not present

## 2014-02-26 ENCOUNTER — Ambulatory Visit: Payer: Medicare Other | Attending: Neurosurgery | Admitting: Physical Therapy

## 2014-02-26 DIAGNOSIS — M6281 Muscle weakness (generalized): Secondary | ICD-10-CM | POA: Diagnosis not present

## 2014-02-26 DIAGNOSIS — IMO0001 Reserved for inherently not codable concepts without codable children: Secondary | ICD-10-CM | POA: Diagnosis not present

## 2014-02-26 DIAGNOSIS — R293 Abnormal posture: Secondary | ICD-10-CM | POA: Insufficient documentation

## 2014-02-26 DIAGNOSIS — M25659 Stiffness of unspecified hip, not elsewhere classified: Secondary | ICD-10-CM | POA: Diagnosis not present

## 2014-02-26 DIAGNOSIS — M545 Low back pain, unspecified: Secondary | ICD-10-CM | POA: Diagnosis not present

## 2014-02-27 DIAGNOSIS — D219 Benign neoplasm of connective and other soft tissue, unspecified: Secondary | ICD-10-CM | POA: Diagnosis not present

## 2014-02-27 DIAGNOSIS — L918 Other hypertrophic disorders of the skin: Secondary | ICD-10-CM | POA: Diagnosis not present

## 2014-02-27 DIAGNOSIS — L821 Other seborrheic keratosis: Secondary | ICD-10-CM | POA: Diagnosis not present

## 2014-02-27 DIAGNOSIS — D485 Neoplasm of uncertain behavior of skin: Secondary | ICD-10-CM | POA: Diagnosis not present

## 2014-02-27 DIAGNOSIS — L820 Inflamed seborrheic keratosis: Secondary | ICD-10-CM | POA: Diagnosis not present

## 2014-02-27 DIAGNOSIS — D1801 Hemangioma of skin and subcutaneous tissue: Secondary | ICD-10-CM | POA: Diagnosis not present

## 2014-03-04 ENCOUNTER — Ambulatory Visit: Payer: Medicare Other | Attending: Neurosurgery | Admitting: Rehabilitation

## 2014-03-04 DIAGNOSIS — Z5189 Encounter for other specified aftercare: Secondary | ICD-10-CM | POA: Diagnosis not present

## 2014-03-04 DIAGNOSIS — M545 Low back pain: Secondary | ICD-10-CM | POA: Diagnosis not present

## 2014-03-04 DIAGNOSIS — M6281 Muscle weakness (generalized): Secondary | ICD-10-CM | POA: Diagnosis not present

## 2014-03-04 DIAGNOSIS — R293 Abnormal posture: Secondary | ICD-10-CM | POA: Insufficient documentation

## 2014-03-04 DIAGNOSIS — M25659 Stiffness of unspecified hip, not elsewhere classified: Secondary | ICD-10-CM | POA: Insufficient documentation

## 2014-03-10 DIAGNOSIS — Z23 Encounter for immunization: Secondary | ICD-10-CM | POA: Diagnosis not present

## 2014-03-11 ENCOUNTER — Ambulatory Visit: Payer: Medicare Other | Admitting: Rehabilitation

## 2014-03-11 DIAGNOSIS — Z5189 Encounter for other specified aftercare: Secondary | ICD-10-CM | POA: Diagnosis not present

## 2014-03-13 ENCOUNTER — Ambulatory Visit: Payer: Medicare Other | Admitting: Rehabilitation

## 2014-03-13 DIAGNOSIS — I10 Essential (primary) hypertension: Secondary | ICD-10-CM | POA: Diagnosis not present

## 2014-03-13 DIAGNOSIS — E782 Mixed hyperlipidemia: Secondary | ICD-10-CM | POA: Diagnosis not present

## 2014-03-13 DIAGNOSIS — G609 Hereditary and idiopathic neuropathy, unspecified: Secondary | ICD-10-CM | POA: Diagnosis not present

## 2014-03-13 DIAGNOSIS — D510 Vitamin B12 deficiency anemia due to intrinsic factor deficiency: Secondary | ICD-10-CM | POA: Diagnosis not present

## 2014-03-13 DIAGNOSIS — J309 Allergic rhinitis, unspecified: Secondary | ICD-10-CM | POA: Diagnosis not present

## 2014-03-13 DIAGNOSIS — N390 Urinary tract infection, site not specified: Secondary | ICD-10-CM | POA: Diagnosis not present

## 2014-03-13 DIAGNOSIS — E1140 Type 2 diabetes mellitus with diabetic neuropathy, unspecified: Secondary | ICD-10-CM | POA: Diagnosis not present

## 2014-03-13 DIAGNOSIS — D81819 Biotin-dependent carboxylase deficiency, unspecified: Secondary | ICD-10-CM | POA: Diagnosis not present

## 2014-03-13 DIAGNOSIS — E039 Hypothyroidism, unspecified: Secondary | ICD-10-CM | POA: Diagnosis not present

## 2014-03-17 ENCOUNTER — Ambulatory Visit: Payer: Medicare Other | Admitting: Physical Therapy

## 2014-03-18 ENCOUNTER — Ambulatory Visit: Payer: Medicare Other | Admitting: Rehabilitation

## 2014-03-18 DIAGNOSIS — Z5189 Encounter for other specified aftercare: Secondary | ICD-10-CM | POA: Diagnosis not present

## 2014-03-19 DIAGNOSIS — M5030 Other cervical disc degeneration, unspecified cervical region: Secondary | ICD-10-CM | POA: Diagnosis not present

## 2014-03-20 ENCOUNTER — Ambulatory Visit: Payer: Medicare Other | Admitting: Rehabilitation

## 2014-03-20 DIAGNOSIS — Z5189 Encounter for other specified aftercare: Secondary | ICD-10-CM | POA: Diagnosis not present

## 2014-03-24 ENCOUNTER — Ambulatory Visit: Payer: Medicare Other | Admitting: Physical Therapy

## 2014-03-24 DIAGNOSIS — Z5189 Encounter for other specified aftercare: Secondary | ICD-10-CM | POA: Diagnosis not present

## 2014-03-27 ENCOUNTER — Ambulatory Visit: Payer: Medicare Other | Admitting: Physical Therapy

## 2014-03-28 ENCOUNTER — Ambulatory Visit: Payer: Medicare Other | Admitting: *Deleted

## 2014-03-29 ENCOUNTER — Encounter: Payer: Self-pay | Admitting: Emergency Medicine

## 2014-03-29 ENCOUNTER — Emergency Department (INDEPENDENT_AMBULATORY_CARE_PROVIDER_SITE_OTHER)
Admission: EM | Admit: 2014-03-29 | Discharge: 2014-03-29 | Disposition: A | Discharge status: Home / Self Care | Payer: Medicare Other | Source: Home / Self Care | Attending: Emergency Medicine | Admitting: Emergency Medicine

## 2014-03-29 DIAGNOSIS — N390 Urinary tract infection, site not specified: Secondary | ICD-10-CM | POA: Diagnosis not present

## 2014-03-29 LAB — POCT URINALYSIS DIP (MANUAL ENTRY)
Bilirubin, UA: NEGATIVE
Blood, UA: NEGATIVE
Glucose, UA: NEGATIVE
Ketones, POC UA: NEGATIVE
Nitrite, UA: NEGATIVE
Protein Ur, POC: NEGATIVE
Spec Grav, UA: 1.015
Urobilinogen, UA: 0.2
pH, UA: 6.5

## 2014-03-29 MED ORDER — SULFAMETHOXAZOLE-TRIMETHOPRIM 800-160 MG PO TABS: 1.0000 | ORAL_TABLET | Freq: Two times a day (BID) | ORAL | Status: AC

## 2014-03-29 MED ORDER — FLUCONAZOLE 200 MG PO TABS: 200.0000 mg | ORAL_TABLET | Freq: Every day | ORAL | Status: AC

## 2014-03-29 NOTE — ED Provider Notes (Signed)
CSN: [id number]     Arrival date & time 03/29/14  1324 History   First MD Initiated Contact with Patient 03/29/14 1352     Chief Complaint  Patient presents with  . Dysuria  . Back Pain   (Consider location/radiation/quality/duration/timing/severity/associated sxs/prior Treatment) Patient is a 73 y.o. female presenting with dysuria and back pain. The history is provided by the patient. No language interpreter was used.  Dysuria Pain quality:  Aching Pain severity:  Moderate Onset quality:  Gradual Timing:  Constant Progression:  Worsening Chronicity:  New Recent urinary tract infections: yes   Relieved by:  Nothing Worsened by:  Nothing tried Ineffective treatments:  None tried Urinary symptoms: frequent urination   Urinary symptoms: no discolored urine   Associated symptoms: no abdominal pain   Risk factors: renal disease   Back Pain Associated symptoms: dysuria   Associated symptoms: no abdominal pain    patient complains of soreness in her back area and she reports she has had frequent UTIs since stopping at the present in August. She recently was treated with Keflex Macrobid and Cipro patient feels like she has a another urinary tract infection she also complains of having a yeast infection patient reports she feels that she has yeast in her throat and vaginal yeast. She has been doing nystatin mouth and some without results  Past Medical History  Diagnosis Date  . CAD (coronary artery disease)   . Diabetes mellitus   . Obesity   . Fibromyalgia   . Family hx of colon cancer   . Depression   . Hypertension   . Obesity   . GERD (gastroesophageal reflux disease)     pt denies  . Chronic kidney disease     low kidney function; BUN was high acc to pt  . PONV (postoperative nausea and vomiting)     with stapedectomy   Past Surgical History  Procedure Laterality Date  . Cholecystectomy    . Dilation and curettage of uterus    . Tubal ligation    . Stapedectomy    .  Shoulder surgery      bilateral rotator cuff repairs   Family History  Problem Relation Age of Onset  . Colon cancer Brother   . Diabetes Mother     and Sister   . Uterine cancer Sister    History  Substance Use Topics  . Smoking status: Never Smoker   . Smokeless tobacco: Never Used  . Alcohol Use: No   OB History   Grav Para Term Preterm Abortions TAB SAB Ect Mult Living                 Review of Systems  Gastrointestinal: Negative for abdominal pain.  Genitourinary: Positive for dysuria.  Musculoskeletal: Positive for back pain.  All other systems reviewed and are negative.   Allergies  Onglyza; Polysporin; Cephalosporins; Lisinopril; Penicillins; Victoza; and Mucinex  Home Medications   Prior to Admission medications   Medication Sig Start Date End Date Taking? Authorizing Provider  amLODipine (NORVASC) 10 MG tablet Take 5 mg by mouth every morning. Take 1/2 tablet daily    Historical Provider, MD  aspirin EC 81 MG tablet Take 81 mg by mouth every evening.    Historical Provider, MD  B Complex-C (B-COMPLEX WITH VITAMIN C) tablet Take 1 tablet by mouth every evening.    Historical Provider, MD  carvedilol (COREG) 6.25 MG tablet Take 6.25 mg by mouth 2 (two) times daily with a meal.  Historical Provider, MD  Cranberry 405 MG CAPS Take 1 capsule by mouth daily.    Historical Provider, MD  Cyanocobalamin (VITAMIN B-12 IJ) Inject 1,000 mg as directed every 30 (thirty) days.    Historical Provider, MD  fluticasone (FLONASE) 50 MCG/ACT nasal spray Place 2 sprays into the nose daily.    Historical Provider, MD  gabapentin (NEURONTIN) 400 MG capsule Take 1 capsule (400 mg total) by mouth 3 (three) times daily. 02/06/13   Domenic Polite, MD  HYDROcodone-acetaminophen (NORCO) 10-325 MG per tablet Take 1 tablet by mouth every 6 (six) hours as needed for pain (back pain).    Historical Provider, MD  insulin detemir (LEVEMIR) 100 UNIT/ML injection Inject 46 Units into the skin  daily.     Historical Provider, MD  levothyroxine (SYNTHROID, LEVOTHROID) 137 MCG tablet Take 137 mcg by mouth every morning.     Historical Provider, MD  metaxalone (SKELAXIN) 800 MG tablet Take one tab by mouth at bedtime. 10/20/13   Kandra Nicolas, MD  mometasone (ELOCON) 0.1 % cream Apply 1 application topically daily as needed (Uses instead of neosprorin).    Historical Provider, MD  Multiple Vitamin (MULTIVITAMIN) tablet Take 1 tablet by mouth every evening.     Historical Provider, MD  nortriptyline (PAMELOR) 25 MG capsule Take 2 capsules (50 mg total) by mouth at bedtime. 02/06/13   Domenic Polite, MD  oxybutynin (DITROPAN-XL) 10 MG 24 hr tablet Take 5 mg by mouth every evening.     Historical Provider, MD  potassium chloride (K-DUR,KLOR-CON) 10 MEQ tablet Take 10 mEq by mouth every evening.    Historical Provider, MD  pravastatin (PRAVACHOL) 80 MG tablet Take 80 mg by mouth every evening.  12/03/10   Historical Provider, MD  sertraline (ZOLOFT) 100 MG tablet Take 100 mg by mouth every morning.     Historical Provider, MD  trimethoprim (TRIMPEX) 100 MG tablet Take 100 mg by mouth daily.    Historical Provider, MD  Vitamin D, Ergocalciferol, (DRISDOL) 50000 UNITS CAPS Take 50,000 Units by mouth every 30 (thirty) days.  09/27/10   Historical Provider, MD   BP 128/73  Pulse 49  Temp(Src) 97.9 F (36.6 C) (Oral)  Ht 5\' 3"  (1.6 m)  Wt 213 lb (96.616 kg)  BMI 37.74 kg/m2  SpO2 95% Physical Exam  Nursing note and vitals reviewed. Constitutional: She is oriented to person, place, and time. She appears well-developed and well-nourished.  HENT:  Head: Normocephalic.  Eyes: EOM are normal. Pupils are equal, round, and reactive to light.  Neck: Normal range of motion.  Cardiovascular: Normal rate.   Pulmonary/Chest: Effort normal.  Abdominal: Soft. She exhibits no distension.  Musculoskeletal: Normal range of motion.  Neurological: She is alert and oriented to person, place, and time.   Psychiatric: She has a normal mood and affect.    ED Course  Procedures (including critical care time) Labs Review Labs Reviewed  POCT URINALYSIS DIP (MANUAL ENTRY)    Imaging Review No results found.   MDM patient's urine shows positive leukocytes. I will send culture on urine. I advised patient I think she needs to see her urologist as she has had multiple recent infections. I will try Bactrim for treatment patient also is given a prescription for Diflucan. She is advised to schedule to see Dr. Junious Silk next week    1. Urinary tract infection without hematuria, site unspecified    AVS    Fransico Meadow, PA-C 03/29/14 1433

## 2014-03-29 NOTE — Discharge Instructions (Signed)

## 2014-03-29 NOTE — ED Notes (Signed)
Pt has a history of UTIs and has had one on and off for a month.  Pt has had painful urination, frequency, and low back pain for 1 week.  Back pain 4/10.

## 2014-03-31 ENCOUNTER — Ambulatory Visit: Payer: Medicare Other | Attending: Neurosurgery | Admitting: Rehabilitation

## 2014-03-31 DIAGNOSIS — Z5189 Encounter for other specified aftercare: Secondary | ICD-10-CM | POA: Diagnosis not present

## 2014-03-31 DIAGNOSIS — M25659 Stiffness of unspecified hip, not elsewhere classified: Secondary | ICD-10-CM | POA: Insufficient documentation

## 2014-03-31 DIAGNOSIS — M6281 Muscle weakness (generalized): Secondary | ICD-10-CM | POA: Diagnosis not present

## 2014-03-31 DIAGNOSIS — M545 Low back pain: Secondary | ICD-10-CM | POA: Diagnosis not present

## 2014-03-31 DIAGNOSIS — R293 Abnormal posture: Secondary | ICD-10-CM | POA: Insufficient documentation

## 2014-04-01 LAB — URINE CULTURE: Colony Count: >=100000

## 2014-04-02 ENCOUNTER — Telehealth: Payer: Self-pay | Admitting: *Deleted

## 2014-04-02 DIAGNOSIS — N390 Urinary tract infection, site not specified: Secondary | ICD-10-CM | POA: Diagnosis not present

## 2014-04-02 DIAGNOSIS — R300 Dysuria: Secondary | ICD-10-CM | POA: Diagnosis not present

## 2014-04-02 DIAGNOSIS — N952 Postmenopausal atrophic vaginitis: Secondary | ICD-10-CM | POA: Diagnosis not present

## 2014-04-03 ENCOUNTER — Ambulatory Visit: Payer: Medicare Other | Admitting: Physical Therapy

## 2014-04-07 ENCOUNTER — Ambulatory Visit: Payer: Medicare Other | Admitting: Physical Therapy

## 2014-04-07 DIAGNOSIS — Z5189 Encounter for other specified aftercare: Secondary | ICD-10-CM | POA: Diagnosis not present

## 2014-04-10 ENCOUNTER — Ambulatory Visit: Payer: Medicare Other | Admitting: Rehabilitation

## 2014-04-10 DIAGNOSIS — Z5189 Encounter for other specified aftercare: Secondary | ICD-10-CM | POA: Diagnosis not present

## 2014-04-15 ENCOUNTER — Ambulatory Visit: Payer: Medicare Other | Admitting: Rehabilitation

## 2014-04-16 DIAGNOSIS — M25512 Pain in left shoulder: Secondary | ICD-10-CM | POA: Diagnosis not present

## 2014-04-18 ENCOUNTER — Ambulatory Visit: Payer: Medicare Other | Admitting: Rehabilitation

## 2014-04-18 DIAGNOSIS — Z5189 Encounter for other specified aftercare: Secondary | ICD-10-CM | POA: Diagnosis not present

## 2014-04-22 ENCOUNTER — Ambulatory Visit: Payer: Medicare Other | Admitting: Rehabilitation

## 2014-04-22 DIAGNOSIS — Z5189 Encounter for other specified aftercare: Secondary | ICD-10-CM | POA: Diagnosis not present

## 2014-04-28 ENCOUNTER — Ambulatory Visit: Payer: Medicare Other | Admitting: Physical Therapy

## 2014-04-28 DIAGNOSIS — Z5189 Encounter for other specified aftercare: Secondary | ICD-10-CM | POA: Diagnosis not present

## 2014-04-29 DIAGNOSIS — E1165 Type 2 diabetes mellitus with hyperglycemia: Secondary | ICD-10-CM | POA: Diagnosis not present

## 2014-04-29 DIAGNOSIS — B029 Zoster without complications: Secondary | ICD-10-CM | POA: Diagnosis not present

## 2014-04-29 DIAGNOSIS — L739 Follicular disorder, unspecified: Secondary | ICD-10-CM | POA: Diagnosis not present

## 2014-04-29 DIAGNOSIS — E6609 Other obesity due to excess calories: Secondary | ICD-10-CM | POA: Diagnosis not present

## 2014-04-29 DIAGNOSIS — E1140 Type 2 diabetes mellitus with diabetic neuropathy, unspecified: Secondary | ICD-10-CM | POA: Diagnosis not present

## 2014-05-01 ENCOUNTER — Ambulatory Visit: Payer: Medicare Other | Admitting: Physical Therapy

## 2014-05-05 ENCOUNTER — Ambulatory Visit: Payer: Medicare Other | Admitting: Physical Therapy

## 2014-05-06 ENCOUNTER — Ambulatory Visit: Payer: Medicare Other | Admitting: *Deleted

## 2014-05-06 DIAGNOSIS — E6609 Other obesity due to excess calories: Secondary | ICD-10-CM | POA: Diagnosis not present

## 2014-05-06 DIAGNOSIS — L739 Follicular disorder, unspecified: Secondary | ICD-10-CM | POA: Diagnosis not present

## 2014-05-06 DIAGNOSIS — E1140 Type 2 diabetes mellitus with diabetic neuropathy, unspecified: Secondary | ICD-10-CM | POA: Diagnosis not present

## 2014-05-06 DIAGNOSIS — B029 Zoster without complications: Secondary | ICD-10-CM | POA: Diagnosis not present

## 2014-05-08 ENCOUNTER — Ambulatory Visit: Payer: Medicare Other | Admitting: Rehabilitation

## 2014-05-13 ENCOUNTER — Ambulatory Visit: Payer: Medicare Other | Attending: Neurosurgery | Admitting: Physical Therapy

## 2014-05-13 DIAGNOSIS — M25659 Stiffness of unspecified hip, not elsewhere classified: Secondary | ICD-10-CM | POA: Insufficient documentation

## 2014-05-13 DIAGNOSIS — M6281 Muscle weakness (generalized): Secondary | ICD-10-CM | POA: Diagnosis not present

## 2014-05-13 DIAGNOSIS — M545 Low back pain: Secondary | ICD-10-CM | POA: Diagnosis not present

## 2014-05-13 DIAGNOSIS — R293 Abnormal posture: Secondary | ICD-10-CM | POA: Diagnosis not present

## 2014-05-13 DIAGNOSIS — Z5189 Encounter for other specified aftercare: Secondary | ICD-10-CM | POA: Diagnosis not present

## 2014-05-16 ENCOUNTER — Ambulatory Visit: Payer: Medicare Other | Admitting: Physical Therapy

## 2014-05-16 DIAGNOSIS — Z5189 Encounter for other specified aftercare: Secondary | ICD-10-CM | POA: Diagnosis not present

## 2014-05-21 ENCOUNTER — Ambulatory Visit: Payer: Medicare Other | Admitting: Rehabilitation

## 2014-05-28 DIAGNOSIS — R3915 Urgency of urination: Secondary | ICD-10-CM | POA: Diagnosis not present

## 2014-05-28 DIAGNOSIS — N952 Postmenopausal atrophic vaginitis: Secondary | ICD-10-CM | POA: Diagnosis not present

## 2014-06-04 ENCOUNTER — Ambulatory Visit (INDEPENDENT_AMBULATORY_CARE_PROVIDER_SITE_OTHER): Payer: Medicare Other | Admitting: Physical Therapy

## 2014-06-04 DIAGNOSIS — M5030 Other cervical disc degeneration, unspecified cervical region: Secondary | ICD-10-CM | POA: Diagnosis not present

## 2014-06-04 DIAGNOSIS — M623 Immobility syndrome (paraplegic): Secondary | ICD-10-CM | POA: Diagnosis not present

## 2014-06-04 DIAGNOSIS — M545 Low back pain: Secondary | ICD-10-CM | POA: Diagnosis not present

## 2014-06-04 DIAGNOSIS — Z79899 Other long term (current) drug therapy: Secondary | ICD-10-CM | POA: Diagnosis not present

## 2014-06-04 DIAGNOSIS — M4302 Spondylolysis, cervical region: Secondary | ICD-10-CM | POA: Diagnosis not present

## 2014-06-04 DIAGNOSIS — M2550 Pain in unspecified joint: Secondary | ICD-10-CM | POA: Diagnosis not present

## 2014-06-04 DIAGNOSIS — M542 Cervicalgia: Secondary | ICD-10-CM

## 2014-06-04 DIAGNOSIS — G894 Chronic pain syndrome: Secondary | ICD-10-CM | POA: Diagnosis not present

## 2014-06-06 DIAGNOSIS — L600 Ingrowing nail: Secondary | ICD-10-CM | POA: Diagnosis not present

## 2014-06-09 ENCOUNTER — Encounter (INDEPENDENT_AMBULATORY_CARE_PROVIDER_SITE_OTHER): Payer: Medicare Other | Admitting: Physical Therapy

## 2014-06-09 DIAGNOSIS — M6281 Muscle weakness (generalized): Secondary | ICD-10-CM | POA: Diagnosis not present

## 2014-06-09 DIAGNOSIS — M545 Low back pain: Secondary | ICD-10-CM | POA: Diagnosis not present

## 2014-06-09 DIAGNOSIS — M2550 Pain in unspecified joint: Secondary | ICD-10-CM

## 2014-06-09 DIAGNOSIS — M542 Cervicalgia: Secondary | ICD-10-CM

## 2014-06-10 DIAGNOSIS — H00015 Hordeolum externum left lower eyelid: Secondary | ICD-10-CM | POA: Diagnosis not present

## 2014-06-11 DIAGNOSIS — Z1231 Encounter for screening mammogram for malignant neoplasm of breast: Secondary | ICD-10-CM | POA: Diagnosis not present

## 2014-06-13 ENCOUNTER — Encounter (INDEPENDENT_AMBULATORY_CARE_PROVIDER_SITE_OTHER): Payer: Medicare Other | Admitting: Physical Therapy

## 2014-06-13 DIAGNOSIS — M6281 Muscle weakness (generalized): Secondary | ICD-10-CM

## 2014-06-13 DIAGNOSIS — M545 Low back pain: Secondary | ICD-10-CM | POA: Diagnosis not present

## 2014-06-13 DIAGNOSIS — M542 Cervicalgia: Secondary | ICD-10-CM | POA: Diagnosis present

## 2014-06-13 DIAGNOSIS — M2550 Pain in unspecified joint: Secondary | ICD-10-CM | POA: Diagnosis not present

## 2014-06-16 ENCOUNTER — Encounter: Payer: Medicare Other | Admitting: Physical Therapy

## 2014-06-16 ENCOUNTER — Other Ambulatory Visit: Payer: Self-pay | Admitting: Orthopaedic Surgery

## 2014-06-16 DIAGNOSIS — M25512 Pain in left shoulder: Secondary | ICD-10-CM

## 2014-06-19 ENCOUNTER — Encounter: Payer: Medicare Other | Admitting: Physical Therapy

## 2014-06-19 DIAGNOSIS — L600 Ingrowing nail: Secondary | ICD-10-CM | POA: Diagnosis not present

## 2014-06-23 DIAGNOSIS — Z6838 Body mass index (BMI) 38.0-38.9, adult: Secondary | ICD-10-CM | POA: Diagnosis not present

## 2014-06-23 DIAGNOSIS — M545 Low back pain: Secondary | ICD-10-CM | POA: Diagnosis not present

## 2014-06-23 DIAGNOSIS — M4806 Spinal stenosis, lumbar region: Secondary | ICD-10-CM | POA: Diagnosis not present

## 2014-06-23 DIAGNOSIS — M4317 Spondylolisthesis, lumbosacral region: Secondary | ICD-10-CM | POA: Diagnosis not present

## 2014-06-23 DIAGNOSIS — M5136 Other intervertebral disc degeneration, lumbar region: Secondary | ICD-10-CM | POA: Diagnosis not present

## 2014-06-23 DIAGNOSIS — M5417 Radiculopathy, lumbosacral region: Secondary | ICD-10-CM | POA: Diagnosis not present

## 2014-06-24 ENCOUNTER — Ambulatory Visit
Admission: RE | Admit: 2014-06-24 | Discharge: 2014-06-24 | Disposition: A | Discharge status: Home / Self Care | Payer: Medicare Other | Source: Ambulatory Visit | Attending: Orthopaedic Surgery | Admitting: Orthopaedic Surgery

## 2014-06-24 DIAGNOSIS — M25512 Pain in left shoulder: Secondary | ICD-10-CM | POA: Diagnosis not present

## 2014-06-24 DIAGNOSIS — Z87828 Personal history of other (healed) physical injury and trauma: Secondary | ICD-10-CM | POA: Diagnosis not present

## 2014-06-24 MED ORDER — IOHEXOL 180 MG/ML  SOLN
18.0000 mL | Freq: Once | INTRAMUSCULAR | Status: AC | PRN
  Administered 2014-06-24: 18 mL via INTRA_ARTICULAR

## 2014-06-25 ENCOUNTER — Encounter (INDEPENDENT_AMBULATORY_CARE_PROVIDER_SITE_OTHER): Payer: Medicare Other | Admitting: Physical Therapy

## 2014-06-25 ENCOUNTER — Other Ambulatory Visit: Payer: Self-pay | Admitting: Neurosurgery

## 2014-06-25 ENCOUNTER — Other Ambulatory Visit (HOSPITAL_COMMUNITY): Payer: Self-pay | Admitting: Neurosurgery

## 2014-06-25 DIAGNOSIS — M6281 Muscle weakness (generalized): Secondary | ICD-10-CM | POA: Diagnosis not present

## 2014-06-25 DIAGNOSIS — M4317 Spondylolisthesis, lumbosacral region: Secondary | ICD-10-CM

## 2014-06-25 DIAGNOSIS — M545 Low back pain: Secondary | ICD-10-CM

## 2014-06-25 DIAGNOSIS — M2550 Pain in unspecified joint: Secondary | ICD-10-CM

## 2014-06-25 DIAGNOSIS — M542 Cervicalgia: Secondary | ICD-10-CM | POA: Diagnosis present

## 2014-06-26 ENCOUNTER — Ambulatory Visit: Payer: Medicare Other | Admitting: Dietician

## 2014-06-27 ENCOUNTER — Encounter: Payer: Medicare Other | Admitting: Physical Therapy

## 2014-06-28 ENCOUNTER — Encounter: Payer: Self-pay | Admitting: Emergency Medicine

## 2014-06-28 ENCOUNTER — Emergency Department (INDEPENDENT_AMBULATORY_CARE_PROVIDER_SITE_OTHER)
Admission: EM | Admit: 2014-06-28 | Discharge: 2014-06-28 | Disposition: A | Discharge status: Home / Self Care | Payer: Medicare Other | Source: Home / Self Care | Attending: Family Medicine | Admitting: Family Medicine

## 2014-06-28 DIAGNOSIS — L304 Erythema intertrigo: Secondary | ICD-10-CM | POA: Diagnosis not present

## 2014-06-28 HISTORY — DX: Unspecified rotator cuff tear or rupture of unspecified shoulder, not specified as traumatic: M75.100

## 2014-06-28 MED ORDER — NYSTATIN-TRIAMCINOLONE 100000-0.1 UNIT/GM-% EX OINT: 1.0000 "application " | TOPICAL_OINTMENT | Freq: Two times a day (BID) | CUTANEOUS | Status: DC

## 2014-06-28 NOTE — ED Provider Notes (Signed)
CSN: [id number]     Arrival date & time 06/28/14  1442 History   First MD Initiated Contact with Patient 06/28/14 1508     Chief Complaint  Patient presents with  . Rash     HPI Comments: Two days ago patient felt the onset of a small "pimple" in her right upper inner thigh, and yesterday noted "bruising" and itching.  She has had shingles in the past and is concerned that it may have recurred.  No drainage from the area.  She feels well otherwise.  Patient is a 74 y.o. female presenting with rash. The history is provided by the patient.  Rash Location: right inner upper thigh. Quality: itchiness and redness   Quality: not blistering, not bruising, not burning, not draining, not painful, not peeling, not scaling, not swelling and not weeping   Severity:  Mild Onset quality:  Sudden Duration:  2 days Timing:  Constant Progression:  Worsening Chronicity:  New Context: not chemical exposure, not exposure to similar rash, not hot tub use, not medications, not new detergent/soap and not plant contact   Relieved by:  None tried Worsened by:  Contact Ineffective treatments:  None tried Associated symptoms: no fatigue, no fever, no induration, no myalgias, no nausea, no sore throat and no URI     Past Medical History  Diagnosis Date  . CAD (coronary artery disease)   . Diabetes mellitus   . Obesity   . Fibromyalgia   . Family hx of colon cancer   . Depression   . Hypertension   . Obesity   . GERD (gastroesophageal reflux disease)     pt denies  . Chronic kidney disease     low kidney function; BUN was high acc to pt  . PONV (postoperative nausea and vomiting)     with stapedectomy  . Torn rotator cuff left    current   Past Surgical History  Procedure Laterality Date  . Cholecystectomy    . Dilation and curettage of uterus    . Tubal ligation    . Stapedectomy    . Shoulder surgery      bilateral rotator cuff repairs   Family History  Problem Relation Age of Onset  .  Colon cancer Brother   . Diabetes Mother     and Sister   . Uterine cancer Sister    History  Substance Use Topics  . Smoking status: Never Smoker   . Smokeless tobacco: Never Used  . Alcohol Use: No   OB History    No data available     Review of Systems  Constitutional: Negative for fever and fatigue.  HENT: Negative for sore throat.   Gastrointestinal: Negative for nausea.  Musculoskeletal: Negative for myalgias.  Skin: Positive for rash.  All other systems reviewed and are negative.   Allergies  Onglyza; Polysporin; Cephalosporins; Lisinopril; Penicillins; Victoza; and Mucinex  Home Medications   Prior to Admission medications   Medication Sig Start Date End Date Taking? Authorizing Provider  traMADol (ULTRAM) 50 MG tablet Take by mouth every 6 (six) hours as needed.   Yes Historical Provider, MD  amLODipine (NORVASC) 10 MG tablet Take 5 mg by mouth every morning. Take 1/2 tablet daily    Historical Provider, MD  aspirin EC 81 MG tablet Take 81 mg by mouth every evening.    Historical Provider, MD  B Complex-C (B-COMPLEX WITH VITAMIN C) tablet Take 1 tablet by mouth every evening.    Historical Provider, MD  carvedilol (COREG) 6.25 MG tablet Take 6.25 mg by mouth 2 (two) times daily with a meal.    Historical Provider, MD  Cranberry 405 MG CAPS Take 1 capsule by mouth daily.    Historical Provider, MD  Cyanocobalamin (VITAMIN B-12 IJ) Inject 1,000 mg as directed every 30 (thirty) days.    Historical Provider, MD  fluticasone (FLONASE) 50 MCG/ACT nasal spray Place 2 sprays into the nose daily.    Historical Provider, MD  gabapentin (NEURONTIN) 400 MG capsule Take 1 capsule (400 mg total) by mouth 3 (three) times daily. 02/06/13   Domenic Polite, MD  HYDROcodone-acetaminophen (NORCO) 10-325 MG per tablet Take 1 tablet by mouth every 6 (six) hours as needed for pain (back pain).    Historical Provider, MD  insulin detemir (LEVEMIR) 100 UNIT/ML injection Inject 46 Units into  the skin daily.     Historical Provider, MD  levothyroxine (SYNTHROID, LEVOTHROID) 137 MCG tablet Take 137 mcg by mouth every morning.     Historical Provider, MD  metaxalone (SKELAXIN) 800 MG tablet Take one tab by mouth at bedtime. 10/20/13   Kandra Nicolas, MD  mometasone (ELOCON) 0.1 % cream Apply 1 application topically daily as needed (Uses instead of neosprorin).    Historical Provider, MD  Multiple Vitamin (MULTIVITAMIN) tablet Take 1 tablet by mouth every evening.     Historical Provider, MD  nortriptyline (PAMELOR) 25 MG capsule Take 2 capsules (50 mg total) by mouth at bedtime. 02/06/13   Domenic Polite, MD  nystatin-triamcinolone ointment [facility]) Apply 1 application topically 2 (two) times daily. 06/28/14   Kandra Nicolas, MD  oxybutynin (DITROPAN-XL) 10 MG 24 hr tablet Take 5 mg by mouth every evening.     Historical Provider, MD  potassium chloride (K-DUR,KLOR-CON) 10 MEQ tablet Take 10 mEq by mouth every evening.    Historical Provider, MD  pravastatin (PRAVACHOL) 80 MG tablet Take 80 mg by mouth every evening.  12/03/10   Historical Provider, MD  sertraline (ZOLOFT) 100 MG tablet Take 100 mg by mouth every morning.     Historical Provider, MD  trimethoprim (TRIMPEX) 100 MG tablet Take 100 mg by mouth daily.    Historical Provider, MD  Vitamin D, Ergocalciferol, (DRISDOL) 50000 UNITS CAPS Take 50,000 Units by mouth every 30 (thirty) days.  09/27/10   Historical Provider, MD   BP 126/71 mmHg  Pulse 56  Temp(Src) 97.8 F (36.6 C) (Oral)  Resp 16  Ht 5\' 4"  (1.626 m)  Wt 214 lb (97.07 kg)  BMI 36.72 kg/m2  SpO2 96% Physical Exam  Constitutional: She is oriented to person, place, and time. She appears well-developed and well-nourished. No distress.  Patient is obese (BMI 36.7)  Eyes: Conjunctivae are normal. Pupils are equal, round, and reactive to light.  Genitourinary:     The intertriginous areas of both upper inner thighs have areas of well circumscribed light erythema as  noted on diagram.  No fluorescence under Wood's lamp.    Neurological: She is alert and oriented to person, place, and time.  Skin: Skin is warm and dry.  Nursing note and vitals reviewed.   ED Course  Procedures  none      MDM   1. Intertrigo    Begin Mycolog cream BID. Followup with dermatologist if not improved one week    Kandra Nicolas, MD 06/28/14 [phone]

## 2014-06-28 NOTE — ED Notes (Signed)
States 2 days ago noticed itching on upper/inner right thigh; felt "pimple"; yesterday itching increased and was accompanied by pain; has history of shingles and feels this is an outbreak.

## 2014-06-28 NOTE — Discharge Instructions (Signed)
Intertrigo Intertrigo is a skin condition that occurs in between folds of skin in places on the body that rub together a lot and do not get much ventilation. It is caused by heat, moisture, friction, sweat retention, and lack of air circulation, which produces red, irritated patches and, sometimes, scaling or drainage. People who have diabetes, who are obese, or who have treatment with antibiotics are at increased risk for intertrigo. The most common sites for intertrigo to occur include:  The groin.  The breasts.  The armpits.  Folds of abdominal skin.  Webbed spaces between the fingers or toes. Intertrigo may be aggravated by:  Sweat.  Feces.  Yeast or bacteria that are present near skin folds.  Urine.  Vaginal discharge. HOME CARE INSTRUCTIONS  The following steps can be taken to reduce friction and keep the affected area cool and dry:  Expose skin folds to the air.  Keep deep skin folds separated with cotton or linen cloth. Avoid tight fitting clothing that could cause chafing.  Wear open-toed shoes or sandals to help reduce moisture between the toes.  Apply absorbent powders to affected areas as directed by your caregiver.  Apply over-the-counter barrier pastes, such as zinc oxide, as directed by your caregiver.  If you develop a fungal infection in the affected area, your caregiver may have you use antifungal creams. SEEK MEDICAL CARE IF:   The rash is not improving after 1 week of treatment.  The rash is getting worse (more red, more swollen, more painful, or spreading).  You have a fever or chills. MAKE SURE YOU:   Understand these instructions.  Will watch your condition.  Will get help right away if you are not doing well or get worse. Document Released: 05/16/2005 Document Revised: 08/08/2011 Document Reviewed: 10/29/2009 [employer] Patient Information 2015 [employer]. This information is not intended to replace advice given to you by your health  care provider. Make sure you discuss any questions you have with your health care provider.  

## 2014-06-30 DIAGNOSIS — M75122 Complete rotator cuff tear or rupture of left shoulder, not specified as traumatic: Secondary | ICD-10-CM | POA: Diagnosis not present

## 2014-06-30 DIAGNOSIS — M25512 Pain in left shoulder: Secondary | ICD-10-CM | POA: Diagnosis not present

## 2014-06-30 DIAGNOSIS — M7552 Bursitis of left shoulder: Secondary | ICD-10-CM | POA: Diagnosis not present

## 2014-07-03 DIAGNOSIS — G894 Chronic pain syndrome: Secondary | ICD-10-CM | POA: Diagnosis not present

## 2014-07-03 DIAGNOSIS — M4806 Spinal stenosis, lumbar region: Secondary | ICD-10-CM | POA: Diagnosis not present

## 2014-07-03 DIAGNOSIS — M7061 Trochanteric bursitis, right hip: Secondary | ICD-10-CM | POA: Diagnosis not present

## 2014-07-03 DIAGNOSIS — Z79899 Other long term (current) drug therapy: Secondary | ICD-10-CM | POA: Diagnosis not present

## 2014-07-07 DIAGNOSIS — I10 Essential (primary) hypertension: Secondary | ICD-10-CM | POA: Diagnosis not present

## 2014-07-07 DIAGNOSIS — D510 Vitamin B12 deficiency anemia due to intrinsic factor deficiency: Secondary | ICD-10-CM | POA: Diagnosis not present

## 2014-07-07 DIAGNOSIS — M549 Dorsalgia, unspecified: Secondary | ICD-10-CM | POA: Diagnosis not present

## 2014-07-07 DIAGNOSIS — L03039 Cellulitis of unspecified toe: Secondary | ICD-10-CM | POA: Diagnosis not present

## 2014-07-10 ENCOUNTER — Ambulatory Visit (HOSPITAL_COMMUNITY): Payer: Medicare Other

## 2014-07-11 ENCOUNTER — Ambulatory Visit: Payer: Medicare Other | Admitting: Physical Therapy

## 2014-07-14 ENCOUNTER — Encounter: Payer: Medicare Other | Admitting: Physical Therapy

## 2014-07-18 ENCOUNTER — Ambulatory Visit (INDEPENDENT_AMBULATORY_CARE_PROVIDER_SITE_OTHER): Payer: Medicare Other | Admitting: Physical Therapy

## 2014-07-18 ENCOUNTER — Encounter: Payer: Self-pay | Admitting: Physical Therapy

## 2014-07-18 DIAGNOSIS — M25512 Pain in left shoulder: Secondary | ICD-10-CM

## 2014-07-18 DIAGNOSIS — M6281 Muscle weakness (generalized): Secondary | ICD-10-CM

## 2014-07-18 NOTE — Therapy (Signed)
[place], [place], [address] Phone: [phone]   Fax:  [phone]  Physical Therapy Treatment  Patient Details  Name: Abigail [name] MRN: [id number] Date of Birth: Feb 24, 1941 Referring Provider:  Garald Balding, MD  Encounter Date: 07/18/2014      PT End of Session - 07/18/14 1116    Visit Number 2   Number of Visits 8   Date for PT Re-Evaluation 08/08/14   PT Start Time 1018   PT Stop Time 1108   PT Time Calculation (min) 50 min   Activity Tolerance Patient limited by pain      Past Medical History  Diagnosis Date  . CAD (coronary artery disease)   . Diabetes mellitus   . Obesity   . Fibromyalgia   . Family hx of colon cancer   . Depression   . Hypertension   . Obesity   . GERD (gastroesophageal reflux disease)     pt denies  . Chronic kidney disease     low kidney function; BUN was high acc to pt  . PONV (postoperative nausea and vomiting)     with stapedectomy  . Torn rotator cuff left    current    Past Surgical History  Procedure Laterality Date  . Cholecystectomy    . Dilation and curettage of uterus    . Tubal ligation    . Stapedectomy    . Shoulder surgery      bilateral rotator cuff repairs    There were no vitals taken for this visit.  Visit Diagnosis:  Pain in joint, shoulder region, left  Muscle weakness (generalized)      Subjective Assessment - 07/18/14 1022    Symptoms haven't slept much in last 3 days; can't get comfortable and now having shooting pain in RLE. Have only done the shoulder blade squeezes from HEP   Currently in Pain? Yes   Pain Score 5    Pain Location Shoulder   Pain Orientation Left   Pain Descriptors / Indicators Sore;Stabbing   Pain Frequency Intermittent   Aggravating Factors  using LUE   Pain Relieving Factors heat, medicine, rest    Multiple Pain Sites Yes   Pain Score 5   Pain Location Back   Pain Radiating  Towards RLE   Pain Descriptors / Indicators Aching;Sharp  RLE "jabbing" down to toes          [facility] Adult PT Treatment/Exercise - 07/18/14 0001    Exercises   Exercises Shoulder   Shoulder Exercises: Standing   Other Standing Exercises Wall ladder   flexion, x 5 reps (up to #21)   Shoulder Exercises: ROM/Strengthening   UBE (Upper Arm Bike) --  Level 0-1, 2 min each way.    Other ROM/Strengthening Exercises Cane ER AAROM x 12 reps   Shoulder Exercises: Isometric Strengthening   Flexion Supine;5X10"   Extension Supine;5X10"   External Rotation Supine;5X10"   Internal Rotation Supine;5X10"   Modalities   Modalities Cryotherapy;Electrical Stimulation   Cryotherapy   Number Minutes Cryotherapy 15 Minutes   Cryotherapy Location Shoulder  Lt   Type of Cryotherapy --  vaso   Electrical Stimulation   Electrical Stimulation Location --  Lt shoulder   Electrical Stimulation Action IFC   Electrical Stimulation Parameters 80/150 Hz, x 15 min    Electrical Stimulation Goals Pain           PT Long Term Goals - 07/18/14 1120  PT LONG TERM GOAL #1   Title Demonstrate and/or verbalize techniques to reduce the risk of reinjury to include information on posture and body mechanics.    Time 4   Period Weeks   Status On-going   PT LONG TERM GOAL #2   Title Be independent with advanced HEP    Time 4   Period Weeks   Status On-going   PT LONG TERM GOAL #3   Title Report pain decrease =/>50% with Lt shoulder movement.    Time 4   Period Weeks   Status On-going   PT LONG TERM GOAL #4   Title Increase Lt shoulder ER =/> 70 degrees   Time 4   Period Weeks   Status On-going   PT LONG TERM GOAL #5   Title Improve FOTO =/< 34% limited (CJ level)    Time 4   Period Weeks   Status On-going   Additional Long Term Goals   Additional Long Term Goals Yes   PT LONG TERM GOAL #6   Title Increase strength in Lt shoulder =/>4+/5    Time 4   Period Weeks   Status On-going            Plan - 07/18/14 1117    Clinical Impression Statement Pt reported increased pain AFTER use of UBE, otherwise pt tolerated all exercises today with minimal increase in symptoms. No goals met this session; only 2nd visit.  Pt reported decrease in Lt shoulder to 3/10 after treatment.     PT Frequency 2x / week   PT Duration 4 weeks   PT Treatment/Interventions Cryotherapy;Moist Heat;Ultrasound;Electrical Stimulation;Manual techniques;Patient/family education;Therapeutic exercise   PT Next Visit Plan Continue ROM/ strengthening of Lt shoulder.  Progress HEP.    Consulted and Agree with Plan of Care Patient        Problem List Patient Active Problem List   Diagnosis Date Noted  . Acute encephalopathy 02/05/2013  . Anemia 02/05/2013  . Acute renal failure 02/05/2013  . Dehydration 09/12/2012  . Diabetes mellitus 09/12/2012  . Hypertension 09/12/2012  . Hypothyroidism 09/12/2012  . Hyperlipidemia 09/12/2012   Kerin Perna, PTA 07/18/2014 11:33 AM  [place], [place], [address] Phone: [phone]   Fax:  [phone]

## 2014-07-21 ENCOUNTER — Ambulatory Visit (INDEPENDENT_AMBULATORY_CARE_PROVIDER_SITE_OTHER): Payer: Medicare Other | Admitting: Physical Therapy

## 2014-07-21 DIAGNOSIS — M6281 Muscle weakness (generalized): Secondary | ICD-10-CM | POA: Diagnosis not present

## 2014-07-21 DIAGNOSIS — M25512 Pain in left shoulder: Secondary | ICD-10-CM | POA: Diagnosis not present

## 2014-07-21 DIAGNOSIS — M75122 Complete rotator cuff tear or rupture of left shoulder, not specified as traumatic: Secondary | ICD-10-CM | POA: Diagnosis present

## 2014-07-21 DIAGNOSIS — M7552 Bursitis of left shoulder: Secondary | ICD-10-CM

## 2014-07-21 NOTE — Therapy (Signed)
[place], [place], [address] Phone: [phone]   Fax:  [phone]  Physical Therapy Treatment  Patient Details  Name: Abigail [name] MRN: [id number] Date of Birth: 1940-09-08 Referring Provider:  Garald Balding, MD  Encounter Date: 07/21/2014      PT End of Session - 07/21/14 1408    Visit Number 3   Number of Visits 8   Date for PT Re-Evaluation 08/08/14   PT Start Time 8280   PT Stop Time 1502   PT Time Calculation (min) 57 min      Past Medical History  Diagnosis Date  . CAD (coronary artery disease)   . Diabetes mellitus   . Obesity   . Fibromyalgia   . Family hx of colon cancer   . Depression   . Hypertension   . Obesity   . GERD (gastroesophageal reflux disease)     pt denies  . Chronic kidney disease     low kidney function; BUN was high acc to pt  . PONV (postoperative nausea and vomiting)     with stapedectomy  . Torn rotator cuff left    current    Past Surgical History  Procedure Laterality Date  . Cholecystectomy    . Dilation and curettage of uterus    . Tubal ligation    . Stapedectomy    . Shoulder surgery      bilateral rotator cuff repairs    There were no vitals taken for this visit.  Visit Diagnosis:  Pain in joint, shoulder region, left  Muscle weakness (generalized)      Subjective Assessment - 07/21/14 1408    Symptoms Was able to sleep some this weekend. Tried to do some of HEP exercises.    Currently in Pain? Yes   Pain Score 2   2/10 at rest. Up to 6/10 with arm movement.    Pain Location Shoulder   Pain Orientation Left   Pain Descriptors / Indicators Aching   Aggravating Factors  using arm.    Pain Relieving Factors heat, medicine, rest    Pain Score 5   Pain Location Back   Pain Descriptors / Indicators Sharp   Pain Frequency Constant          [facility] PT Assessment - 07/21/14 0001    Assessment   Medical Diagnosis Left  shoulder pain, impingement, RTC tear    Next MD Visit --  07/28/14   ROM / Strength   AROM / PROM / Strength AROM   AROM   AROM Assessment Site Shoulder   Right/Left Shoulder Left   Left Shoulder Flexion --  145 degrees flexion seated, 151 supine   Left Shoulder ABduction --  130 degrees seated, 142 supine    Left Shoulder Internal Rotation --  75 degrees supine (90 deg ABD)   Left Shoulder External Rotation --  45 degrees supine (90 deg ABD)                   [facility] Adult PT Treatment/Exercise - 07/21/14 0001    Exercises   Exercises Shoulder   Shoulder Exercises: Standing   External Rotation Strengthening;Left;12 reps;Theraband   Theraband Level (Shoulder External Rotation) Level 1 (Yellow)   Internal Rotation Strengthening;Left;12 reps;Theraband   Theraband Level (Shoulder Internal Rotation) Level 1 (Yellow)   Flexion Strengthening;Left;12 reps;Theraband   ABduction Strengthening;Left;12 reps   Shoulder ABduction Weight (lbs) 1 lb   Extension Strengthening;Left;12 reps  Other Standing Exercises --   Shoulder Exercises: Pulleys   Flexion 3 minutes   ABduction 3 minutes   Shoulder Exercises: ROM/Strengthening   Pendulum with 2 lb weight x 20 sec x 2    Other ROM/Strengthening Exercises Wall ladder   flexion and ABD, x 5 reps (up to #25)   Modalities   Modalities Cryotherapy;Electrical Stimulation;Moist Heat   Moist Heat Therapy   Number Minutes Moist Heat 15 Minutes   Moist Heat Location Other (comment)  LB    Cryotherapy   Number Minutes Cryotherapy 15 Minutes   Cryotherapy Location Shoulder  L    Type of Cryotherapy Other (comment)  VASO   Electrical Stimulation   Electrical Stimulation Location Lt shoulder   Electrical Stimulation Action IFC   Electrical Stimulation Parameters 80/150 Hz, x 15 min, to tolerance    Electrical Stimulation Goals Pain                PT Education - 07/21/14 1456    Education provided Yes   Education Details  HEP - [place] 4    Person(s) Educated Patient   Methods Explanation;Demonstration   Comprehension Verbalized understanding;Returned demonstration             PT Long Term Goals - 07/18/14 1120    PT LONG TERM GOAL #1   Title Demonstrate and/or verbalize techniques to reduce the risk of reinjury to include information on posture and body mechanics.    Time 4   Period Weeks   Status On-going   PT LONG TERM GOAL #2   Title Be independent with advanced HEP    Time 4   Period Weeks   Status On-going   PT LONG TERM GOAL #3   Title Report pain decrease =/>50% with Lt shoulder movement.    Time 4   Period Weeks   Status On-going   PT LONG TERM GOAL #4   Title Increase Lt shoulder ER =/> 70 degrees   Time 4   Period Weeks   Status On-going   PT LONG TERM GOAL #5   Title Improve FOTO =/< 34% limited (CJ level)    Time 4   Period Weeks   Status On-going   Additional Long Term Goals   Additional Long Term Goals Yes   PT LONG TERM GOAL #6   Title Increase strength in Lt shoulder =/>4+/5    Time 4   Period Weeks   Status On-going               Plan - 07/21/14 1457    Clinical Impression Statement Pt demonstrated increased ER in L shoulder this visit. Pt able to tolerate new exercises, (progressed to theraband -yellow) with minimal increase in shoulder pain. No goals met, but making good progress.    PT Frequency 2x / week   PT Duration 4 weeks   PT Treatment/Interventions Cryotherapy;Moist Heat;Ultrasound;Electrical Stimulation;Manual techniques;Patient/family education;Therapeutic exercise   PT Next Visit Plan Continue ROM/ strengthening of Lt shoulder.  Progress HEP.    Consulted and Agree with Plan of Care Patient        Problem List Patient Active Problem List   Diagnosis Date Noted  . Acute encephalopathy 02/05/2013  . Anemia 02/05/2013  . Acute renal failure 02/05/2013  . Dehydration 09/12/2012  . Diabetes mellitus 09/12/2012  . Hypertension  09/12/2012  . Hypothyroidism 09/12/2012  . Hyperlipidemia 09/12/2012   Kerin Perna, PTA 07/21/2014 3:13 PM   [facility] [address]  [place], [place], [address] Phone: [phone]   Fax:  [phone]

## 2014-07-21 NOTE — Patient Instructions (Signed)
Strengthening: Resisted Flexion   Hold tubing with left arm at side. Pull forward and up. Move shoulder through pain-free range of motion. Repeat _12___ times per set. Do _1-2___ sets per session. Do __1__ sessions per day.  http://orth.exer.us/824   Copyright  VHI. All rights reserved.  Strengthening: Resisted External Rotation   Hold tubing in left hand, elbow at side and forearm across body. Rotate forearm out. Repeat _12___ times per set. Do __1-2__ sets per session. Do _1___ sessions per day.  http://orth.exer.us/828   Copyright  VHI. All rights reserved.  Strengthening: Resisted Internal Rotation   Hold tubing in left hand, elbow at side and forearm out. Rotate forearm in across body. Repeat __12__ times per set. Do __1-2__ sets per session. Do _1___ sessions per day.  http://orth.exer.us/830   Copyright  VHI. All rights reserved.  Strengthening: Resisted Extension   Hold tubing in left hand, arm forward. Pull arm back, elbow straight. Repeat __12__ times per set. Do _1-2___ sets per session. Do _1___ sessions per day.  http://orth.exer.us/832   Copyright  VHI. All rights reserved.

## 2014-07-25 ENCOUNTER — Ambulatory Visit (INDEPENDENT_AMBULATORY_CARE_PROVIDER_SITE_OTHER): Payer: Medicare Other | Admitting: Physical Therapy

## 2014-07-25 ENCOUNTER — Other Ambulatory Visit: Payer: Self-pay | Admitting: Neurosurgery

## 2014-07-25 DIAGNOSIS — M25512 Pain in left shoulder: Secondary | ICD-10-CM

## 2014-07-25 DIAGNOSIS — M6281 Muscle weakness (generalized): Secondary | ICD-10-CM

## 2014-07-25 NOTE — Therapy (Signed)
[place], [place], [address] Phone: [phone]   Fax:  [phone]  Physical Therapy Treatment  Patient Details  Name: Abigail [name] MRN: [id number] Date of Birth: 1940-12-16 Referring Provider:  Garald Balding, MD  Encounter Date: 07/25/2014      PT End of Session - 07/25/14 1418    Visit Number 4   Number of Visits 8   Date for PT Re-Evaluation 08/08/14   PT Start Time 1330   PT Stop Time 1433   PT Time Calculation (min) 63 min   Activity Tolerance Patient tolerated treatment well;Patient limited by pain      Past Medical History  Diagnosis Date  . CAD (coronary artery disease)   . Diabetes mellitus   . Obesity   . Fibromyalgia   . Family hx of colon cancer   . Depression   . Hypertension   . Obesity   . GERD (gastroesophageal reflux disease)     pt denies  . Chronic kidney disease     low kidney function; BUN was high acc to pt  . PONV (postoperative nausea and vomiting)     with stapedectomy  . Torn rotator cuff left    current    Past Surgical History  Procedure Laterality Date  . Cholecystectomy    . Dilation and curettage of uterus    . Tubal ligation    . Stapedectomy    . Shoulder surgery      bilateral rotator cuff repairs    There were no vitals taken for this visit.  Visit Diagnosis:  Pain in joint, shoulder region, left  Muscle weakness (generalized)      Subjective Assessment - 07/25/14 1337    Symptoms hurt bad Wed. during session. HEP going slow.    Currently in Pain? Yes  1/10 with rest, 3-4/10 with some exercise   Pain Location Shoulder   Pain Orientation Left   Pain Descriptors / Indicators Aching;Sharp   Pain Type Chronic pain   Pain Onset More than a month ago   Pain Frequency Intermittent   Aggravating Factors  weather driven damp and rainey make it worse   Pain Relieving Factors rest, heat   Effect of Pain on Daily Activities  cannot reach, gets frustrated with not being able to do everything at home with chores.   Multiple Pain Sites No                    [facility] Adult PT Treatment/Exercise - 07/25/14 0001    Exercises   Exercises Shoulder   Shoulder Exercises: Supine   Theraband Level (Shoulder External Rotation) Level 1 (Yellow)  2 X10 seated second to back pain today 4/10   Theraband Level (Shoulder Internal Rotation) Level 1 (Yellow)  standing 2X10    Shoulder Exercises: Standing   Flexion Left;20 reps;Theraband  yellow band   ABduction Left;20 reps;Theraband   Theraband Level (Shoulder Retraction) Level 1 (Yellow)  2X10 standing issued new yellow band   Other Standing Exercises protraction  triangle c hands clasped, press forearm to wall hold 3" 20x   Shoulder Exercises: Pulleys   Flexion 3 minutes   Flexion Limitations 3 min   Shoulder Exercises: Therapy Ball   ABduction 20 reps  with a yellow band 2X10   Modalities   Modalities Cryotherapy;Electrical Stimulation;Moist Heat  vaso X15   Moist Heat Therapy   Moist Heat Location Other (comment)  low back seated  Cryotherapy   Cryotherapy Location Shoulder;Other (comment);Forearm  seated X15   Acupuncturist Location lt shoulder   Electrical Stimulation Action IFC   Electrical Stimulation Parameters 80/150   Electrical Stimulation Goals Pain                     PT Long Term Goals - 07/18/14 1120    PT LONG TERM GOAL #1   Title Demonstrate and/or verbalize techniques to reduce the risk of reinjury to include information on posture and body mechanics.    Time 4   Period Weeks   Status On-going   PT LONG TERM GOAL #2   Title Be independent with advanced HEP    Time 4   Period Weeks   Status On-going   PT LONG TERM GOAL #3   Title Report pain decrease =/>50% with Lt shoulder movement.    Time 4   Period Weeks   Status On-going   PT LONG TERM GOAL #4   Title Increase Lt  shoulder ER =/> 70 degrees   Time 4   Period Weeks   Status On-going   PT LONG TERM GOAL #5   Title Improve FOTO =/< 34% limited (CJ level)    Time 4   Period Weeks   Status On-going   Additional Long Term Goals   Additional Long Term Goals Yes   PT LONG TERM GOAL #6   Title Increase strength in Lt shoulder =/>4+/5    Time 4   Period Weeks   Status On-going               Plan - 07/25/14 1426    Clinical Impression Statement Pt did well today with new ther ex.    PT Frequency 2x / week   PT Duration 4 weeks   PT Treatment/Interventions Cryotherapy;Moist Heat;Ultrasound;Electrical Stimulation;Manual techniques;Patient/family education;Therapeutic exercise   PT Next Visit Plan Continue ROM/ strengthening of Lt shoulder.  Progress HEP.    Consulted and Agree with Plan of Care Patient        Problem List Patient Active Problem List   Diagnosis Date Noted  . Acute encephalopathy 02/05/2013  . Anemia 02/05/2013  . Acute renal failure 02/05/2013  . Dehydration 09/12/2012  . Diabetes mellitus 09/12/2012  . Hypertension 09/12/2012  . Hypothyroidism 09/12/2012  . Hyperlipidemia 09/12/2012    Natividad Brood 07/25/2014, 2:33 PM  [facility] [place] [address] [place], [place], [address] Phone: [phone]   Fax:  [phone]

## 2014-07-29 ENCOUNTER — Encounter: Payer: Self-pay | Admitting: Physical Therapy

## 2014-07-29 ENCOUNTER — Ambulatory Visit (INDEPENDENT_AMBULATORY_CARE_PROVIDER_SITE_OTHER): Payer: Medicare Other | Admitting: Physical Therapy

## 2014-07-29 DIAGNOSIS — M25512 Pain in left shoulder: Secondary | ICD-10-CM

## 2014-07-29 DIAGNOSIS — M6281 Muscle weakness (generalized): Secondary | ICD-10-CM

## 2014-07-29 NOTE — Patient Instructions (Signed)
Strengthening: Isometric Flexion   Using other hand, apply light resistance to forward motion. Hold __5__ seconds. Repeat _10___ times per set. Do __1__ sets per session. Do _1___ sessions per day.  http://orth.exer.us/802   Copyright  VHI. All rights reserved.  Strengthening: Isometric Extension   Using wall for resistance, press back of left arm into ball using light pressure. Hold _5___ seconds. Repeat __10__ times per set. Do __1__ sets per session. Do _1___ sessions per day.  http://orth.exer.us/804   Copyright  VHI. All rights reserved.  Strengthening: Isometric Abduction   Keeping left elbow bent, use other hand around elbow to apply light resistance to outward motion. Hold _5___ seconds. Repeat __10__ times per set. Do _1___ sets per session. Do _1___ sessions per day.  http://orth.exer.us/808   Copyright  VHI. All rights reserved.  Strengthening: Isometric External Rotation   Keeping left elbow at side, use other hand at wrist to apply light resistance to outward motion. Hold _5___ seconds. Repeat __10__ times per set. Do _1___ sets per session. Do __1__ sessions per day.  http://orth.exer.us/812   Copyright  VHI. All rights reserved.  Strengthening: Isometric Internal Rotation   Keeping right elbow at side, use other hand at wrist to apply light resistance to inward motion. Hold _5___ seconds. Repeat _10___ times per set. Do _1___ sets per session. Do __1__ sessions per day.  http://orth.exer.us/818   Copyright  VHI. All rights reserved.

## 2014-07-29 NOTE — Therapy (Signed)
[place], [place], [address] Phone: [phone]   Fax:  [phone]  Physical Therapy Treatment  Patient Details  Name: Abigail [name] MRN: [id number] Date of Birth: August 29, 1940 Referring Provider:  Garald Balding, MD  Encounter Date: 07/29/2014      PT End of Session - 07/29/14 1015    Visit Number 5   Number of Visits 8   Date for PT Re-Evaluation 08/08/14   PT Start Time 9735   PT Stop Time 1112   PT Time Calculation (min) 57 min   Activity Tolerance Patient tolerated treatment well      Past Medical History  Diagnosis Date  . CAD (coronary artery disease)   . Diabetes mellitus   . Obesity   . Fibromyalgia   . Family hx of colon cancer   . Depression   . Hypertension   . Obesity   . GERD (gastroesophageal reflux disease)     pt denies  . Chronic kidney disease     low kidney function; BUN was high acc to pt  . PONV (postoperative nausea and vomiting)     with stapedectomy  . Torn rotator cuff left    current    Past Surgical History  Procedure Laterality Date  . Cholecystectomy    . Dilation and curettage of uterus    . Tubal ligation    . Stapedectomy    . Shoulder surgery      bilateral rotator cuff repairs    There were no vitals taken for this visit.  Visit Diagnosis:  Pain in joint, shoulder region, left  Muscle weakness (generalized)      Subjective Assessment - 07/29/14 1018    Currently in Pain? Yes   Pain Score 3    Pain Location --  stabbing   Pain Orientation Left   Pain Descriptors / Indicators Stabbing   Pain Radiating Towards moving arm   Pain Onset More than a month ago   Pain Frequency Constant   Pain Relieving Factors rest                    [facility] Adult PT Treatment/Exercise - 07/29/14 0001    Shoulder Exercises: Supine   Other Supine Exercises 10 reps shoulder presses, then thoracic lifts   Shoulder Exercises: Prone   Retraction Left;Weights  supine with arm off the edge of bed   Retraction Weight (lbs) 1   Other Prone Exercises T's off EOB 3x10   Shoulder Exercises: Pulleys   Flexion 3 minutes   ABduction 3 minutes   Shoulder Exercises: Isometric Strengthening   Flexion --  standing 10x5sec    Extension --  standing 10x5sec   External Rotation --  standing 10x5sec   Internal Rotation --  standing 10x5sec   ABduction --  standing 10 x 5sec   Moist Heat Therapy   Number Minutes Moist Heat 15 Minutes   Moist Heat Location Shoulder  back   Electrical Stimulation   Electrical Stimulation Location lt shoulder   Electrical Stimulation Action IFC   Electrical Stimulation Goals Pain   Manual Therapy   Manual Therapy Joint mobilization   Joint Mobilization grade II mobs to distal Lt clavicle, GH joint posterior and inferior mobs then nerve root stretch of Lt UE.                 PT Education - 07/29/14 1024    Education provided Yes  Education Details HEP modified, taking a step back to isometrics, stop the band exercise for now   Person(s) Educated Patient   Methods Explanation;Demonstration;Handout   Comprehension Returned demonstration             PT Long Term Goals - 07/18/14 1120    PT LONG TERM GOAL #1   Title Demonstrate and/or verbalize techniques to reduce the risk of reinjury to include information on posture and body mechanics.    Time 4   Period Weeks   Status On-going   PT LONG TERM GOAL #2   Title Be independent with advanced HEP    Time 4   Period Weeks   Status On-going   PT LONG TERM GOAL #3   Title Report pain decrease =/>50% with Lt shoulder movement.    Time 4   Period Weeks   Status On-going   PT LONG TERM GOAL #4   Title Increase Lt shoulder ER =/> 70 degrees   Time 4   Period Weeks   Status On-going   PT LONG TERM GOAL #5   Title Improve FOTO =/< 34% limited (CJ level)    Time 4   Period Weeks   Status On-going   Additional Long Term  Goals   Additional Long Term Goals Yes   PT LONG TERM GOAL #6   Title Increase strength in Lt shoulder =/>4+/5    Time 4   Period Weeks   Status On-going               Plan - 07/29/14 1038    Clinical Impression Statement Patient continues with shoulder pain flare up.  Adjusted HEP to isometrics, see's the MD on Monday    Pt will benefit from skilled therapeutic intervention in order to improve on the following deficits Decreased strength;Pain;Impaired UE functional use;Decreased range of motion   Rehab Potential Good   PT Frequency 2x / week   PT Duration 4 weeks   PT Treatment/Interventions Electrical Stimulation;Cryotherapy;Ultrasound;Manual techniques;Passive range of motion;Therapeutic exercise;Moist Heat;Patient/family education   PT Next Visit Plan write and send MD note   Consulted and Agree with Plan of Care Patient        Problem List Patient Active Problem List   Diagnosis Date Noted  . Acute encephalopathy 02/05/2013  . Anemia 02/05/2013  . Acute renal failure 02/05/2013  . Dehydration 09/12/2012  . Diabetes mellitus 09/12/2012  . Hypertension 09/12/2012  . Hypothyroidism 09/12/2012  . Hyperlipidemia 09/12/2012    Jeral Pinch, PT 07/29/2014, 11:01 AM  [facility] [place], [place], [address] Phone: [phone]   Fax:  [phone]

## 2014-07-30 DIAGNOSIS — G894 Chronic pain syndrome: Secondary | ICD-10-CM | POA: Diagnosis not present

## 2014-07-30 DIAGNOSIS — M7060 Trochanteric bursitis, unspecified hip: Secondary | ICD-10-CM | POA: Diagnosis not present

## 2014-07-30 DIAGNOSIS — M7061 Trochanteric bursitis, right hip: Secondary | ICD-10-CM | POA: Diagnosis not present

## 2014-07-30 DIAGNOSIS — M542 Cervicalgia: Secondary | ICD-10-CM | POA: Diagnosis not present

## 2014-08-01 ENCOUNTER — Ambulatory Visit (INDEPENDENT_AMBULATORY_CARE_PROVIDER_SITE_OTHER): Payer: Medicare Other | Admitting: Physical Therapy

## 2014-08-01 DIAGNOSIS — M6281 Muscle weakness (generalized): Secondary | ICD-10-CM

## 2014-08-01 DIAGNOSIS — M25512 Pain in left shoulder: Secondary | ICD-10-CM

## 2014-08-01 NOTE — Therapy (Signed)
[place], [place], [address] Phone: [phone]   Fax:  [phone]  Physical Therapy Treatment  Patient Details  Name: Abigail [name] MRN: [id number] Date of Birth: [birth year] Referring Provider:  Garald Balding, MD  Encounter Date: 08/01/2014      PT End of Session - 08/01/14 1400    Visit Number 6   Number of Visits 8   Date for PT Re-Evaluation 08/08/14   PT Start Time 1400   PT Stop Time 1502   PT Time Calculation (min) 62 min   Activity Tolerance Patient tolerated treatment well      Past Medical History  Diagnosis Date  . CAD (coronary artery disease)   . Diabetes mellitus   . Obesity   . Fibromyalgia   . Family hx of colon cancer   . Depression   . Hypertension   . Obesity   . GERD (gastroesophageal reflux disease)     pt denies  . Chronic kidney disease     low kidney function; BUN was high acc to pt  . PONV (postoperative nausea and vomiting)     with stapedectomy  . Torn rotator cuff left    current    Past Surgical History  Procedure Laterality Date  . Cholecystectomy    . Dilation and curettage of uterus    . Tubal ligation    . Stapedectomy    . Shoulder surgery      bilateral rotator cuff repairs    There were no vitals taken for this visit.  Visit Diagnosis:  Pain in joint, shoulder region, left  Muscle weakness (generalized)      Subjective Assessment - 08/01/14 1403    Symptoms Feeling pretty good; received injection in Rt hip Wednesday.     Pain Score 1    Pain Location Shoulder   Pain Orientation Left   Pain Descriptors / Indicators Aching   Pain Frequency Intermittent   Aggravating Factors  reaching with Lt arm    Pain Relieving Factors Rest, not over doing it.           [facility] PT Assessment - 08/01/14 0001    Assessment   Medical Diagnosis Left shoulder pain, impingement, RTC tear    Next MD Visit 08/04/14   ROM / Strength   AROM /  PROM / Strength AROM;Strength   AROM   AROM Assessment Site Shoulder   Right/Left Shoulder Left   Left Shoulder Flexion --  152 degrees flexion seated, 161 supine   Left Shoulder ABduction --  125 degrees seated, 145 supine    Left Shoulder Internal Rotation --  75 degrees supine (90 deg ABD)   Left Shoulder External Rotation --  62 degrees supine, (shoulder ABD to 90 deg)   Strength   Overall Strength Comments --  Lt supraspinatus 4/5   Strength Assessment Site Shoulder   Right/Left Shoulder Left   Left Shoulder Flexion 4+/5  with pain    Left Shoulder ABduction 4/5   Left Shoulder Internal Rotation --  5-/5   Left Shoulder External Rotation 4+/5  with pain                   [facility] Adult PT Treatment/Exercise - 08/01/14 0001    Exercises   Exercises Shoulder   Shoulder Exercises: Prone   Other Prone Exercises T's off edge of bed, 0 # x 12 reps    Shoulder Exercises: Sidelying  External Rotation Strengthening;Left;10 reps;Weights  2 sets    External Rotation Weight (lbs) 1   ABduction Strengthening;Left;10 reps;Weights  2 sets   ABduction Weight (lbs) 1   Shoulder Exercises: Standing   Flexion Strengthening;Left;20 reps;Weights   Shoulder Flexion Weight (lbs) 1   Other Standing Exercises Rings on arch (11) x 2 trials R,to/from L.    Other Standing Exercises Lifting 1#, 2#, off eye level shelf x 5 reps each weight.    Shoulder Exercises: Pulleys   Flexion 3 minutes   ABduction 3 minutes   Shoulder Exercises: ROM/Strengthening   Pendulum without weight in between reps of rings on arch   Modalities   Modalities Cryotherapy;Electrical Stimulation;Moist Heat   Moist Heat Therapy   Number Minutes Moist Heat 15 Minutes   Moist Heat Location Other (comment)  Low back    Cryotherapy   Number Minutes Cryotherapy 15 Minutes   Cryotherapy Location Shoulder   Type of Cryotherapy --  vaso, 3 snowflakes, med pressure   Electrical Stimulation   Electrical  Stimulation Location Lt shoulder    Electrical Stimulation Action IFC    Electrical Stimulation Parameters 80/150 Hz, continuous, 15 min   Electrical Stimulation Goals Pain                     PT Long Term Goals - 08/01/14 1515    PT LONG TERM GOAL #1   Title Demonstrate and/or verbalize techniques to reduce the risk of reinjury to include information on posture and body mechanics.    Time 4   Period Weeks   Status Achieved   PT LONG TERM GOAL #2   Title Be independent with advanced HEP    Time 4   Period Weeks   Status On-going   PT LONG TERM GOAL #3   Title Report pain decrease =/>50% with Lt shoulder movement.    Time 4   Period Weeks   Status Achieved   PT LONG TERM GOAL #4   Title Increase Lt shoulder ER =/> 70 degrees   Time 4   Period Weeks   Status On-going   PT LONG TERM GOAL #5   Title Improve FOTO =/< 34% limited (CJ level)    Time 4   Period Weeks   Status On-going   PT LONG TERM GOAL #6   Title Increase strength in Lt shoulder =/>4+/5    Time 4   Period Weeks   Status Partially Met               Plan - 08/01/14 1424    Clinical Impression Statement Pt demonstrated improved Lt shoulder ROM and strength since beginning PT.  Pt reports improved ability with getting dishes out of cabinet and style hair, but still has trouble donning/doffing clothes without pain. Pt tolerated new exercises with low weight today with minimal increase in pain.  Pt has met LTG #1 and #3, partially met #6.    Pt will benefit from skilled therapeutic intervention in order to improve on the following deficits Decreased strength;Pain;Impaired UE functional use;Decreased range of motion   Rehab Potential Good   PT Frequency 2x / week   PT Duration 4 weeks   PT Treatment/Interventions Electrical Stimulation;Cryotherapy;Ultrasound;Manual techniques;Passive range of motion;Therapeutic exercise;Moist Heat;Patient/family education   PT Next Visit Plan Pt returns to MD on  Monday; is interested in speaking with him regarding her progress prior to continuing therapy sessions.    Consulted and Agree with Plan of Care Patient  Problem List Patient Active Problem List   Diagnosis Date Noted  . Acute encephalopathy 02/05/2013  . Anemia 02/05/2013  . Acute renal failure 02/05/2013  . Dehydration 09/12/2012  . Diabetes mellitus 09/12/2012  . Hypertension 09/12/2012  . Hypothyroidism 09/12/2012  . Hyperlipidemia 09/12/2012   Kerin Perna, PTA 08/01/2014 3:28 PM   [place], [place], [address] Phone: [phone]   Fax:  [phone]

## 2014-08-04 DIAGNOSIS — M25551 Pain in right hip: Secondary | ICD-10-CM | POA: Diagnosis not present

## 2014-08-04 DIAGNOSIS — M7552 Bursitis of left shoulder: Secondary | ICD-10-CM | POA: Diagnosis not present

## 2014-08-05 ENCOUNTER — Ambulatory Visit (HOSPITAL_COMMUNITY)
Admission: RE | Admit: 2014-08-05 | Discharge: 2014-08-05 | Disposition: A | Discharge status: Home / Self Care | Payer: Medicare Other | Source: Ambulatory Visit | Attending: Neurosurgery | Admitting: Neurosurgery

## 2014-08-05 ENCOUNTER — Encounter (HOSPITAL_COMMUNITY): Payer: Self-pay

## 2014-08-05 DIAGNOSIS — M4317 Spondylolisthesis, lumbosacral region: Secondary | ICD-10-CM

## 2014-08-05 DIAGNOSIS — M5126 Other intervertebral disc displacement, lumbar region: Secondary | ICD-10-CM | POA: Insufficient documentation

## 2014-08-05 DIAGNOSIS — Z79899 Other long term (current) drug therapy: Secondary | ICD-10-CM | POA: Diagnosis not present

## 2014-08-05 DIAGNOSIS — M4316 Spondylolisthesis, lumbar region: Secondary | ICD-10-CM | POA: Diagnosis not present

## 2014-08-05 DIAGNOSIS — M545 Low back pain: Secondary | ICD-10-CM | POA: Diagnosis present

## 2014-08-05 DIAGNOSIS — M5127 Other intervertebral disc displacement, lumbosacral region: Secondary | ICD-10-CM | POA: Diagnosis not present

## 2014-08-05 DIAGNOSIS — M4806 Spinal stenosis, lumbar region: Secondary | ICD-10-CM | POA: Diagnosis not present

## 2014-08-05 LAB — BUN: BUN: 26 mg/dL — ABNORMAL HIGH (ref 6–23)

## 2014-08-05 LAB — CREATININE, SERUM
Creatinine, Ser: 1.34 mg/dL — ABNORMAL HIGH (ref 0.50–1.10)
GFR calc Af Amer: 45 mL/min — ABNORMAL LOW (ref >90)
GFR calc non Af Amer: 39 mL/min — ABNORMAL LOW (ref >90)

## 2014-08-05 LAB — GLUCOSE, CAPILLARY
Glucose-Capillary: 205 mg/dL — ABNORMAL HIGH (ref 70–99)
Glucose-Capillary: 161 mg/dL — ABNORMAL HIGH (ref 70–99)

## 2014-08-05 MED ORDER — HYDROCODONE-ACETAMINOPHEN 5-325 MG PO TABS
ORAL_TABLET | ORAL | Status: AC
  Filled 2014-08-05: qty 1

## 2014-08-05 MED ORDER — IOHEXOL 180 MG/ML  SOLN
20.0000 mL | Freq: Once | INTRAMUSCULAR | Status: AC | PRN
  Administered 2014-08-05: 16 mL via INTRATHECAL

## 2014-08-05 MED ORDER — ONDANSETRON HCL 4 MG/2ML IJ SOLN: 4.0000 mg | Freq: Four times a day (QID) | INTRAMUSCULAR | Status: DC | PRN

## 2014-08-05 MED ORDER — HYDROCODONE-ACETAMINOPHEN 5-325 MG PO TABS
1.0000 | ORAL_TABLET | ORAL | Status: DC | PRN
  Administered 2014-08-05: 1 via ORAL

## 2014-08-05 MED ORDER — DIAZEPAM 5 MG PO TABS
10.0000 mg | ORAL_TABLET | Freq: Once | ORAL | Status: AC
  Administered 2014-08-05: 10 mg via ORAL

## 2014-08-05 MED ORDER — DIAZEPAM 5 MG PO TABS
ORAL_TABLET | ORAL | Status: AC
  Filled 2014-08-05: qty 2

## 2014-08-05 NOTE — Procedures (Signed)
Lumbar puncture L 23 without complication.  15 cc Omnipaque 180

## 2014-08-05 NOTE — Discharge Instructions (Signed)
Wound Care HOME CARE   Only take medicine as told by your doctor.  Remove band aid in 24 hours, you may shower in 24 hours, no baths for 3-5 days till wound heals.   Put medicated cream and a bandage on the wound as told by your doctor.  Change the bandage if it gets wet, dirty, or starts to smell.  Take showers. Do not take baths, swim, or do anything that puts your wound under water.Marland Kitchen GET HELP RIGHT AWAY IF:         Develop a fever or chills  Yellowish-white fluid (pus) comes from the wound.  Medicine does not lessen your pain.  There is a red streak going away from the wound.  You have a fever. MAKE SURE YOU:   Understand these instructions.  Will watch your condition.  Will get help right away if you are not doing well or get worse. Document Released: 02/23/2008 Document Revised: 08/08/2011 Document Reviewed: 09/19/2010 [facility] Patient Information 2015 [place], [place]. This information is not intended to replace advice given to you by your health care provider. Make sure you discuss any questions you have with your health care provider.

## 2014-08-06 ENCOUNTER — Encounter: Payer: Medicare Other | Admitting: Physical Therapy

## 2014-08-08 ENCOUNTER — Ambulatory Visit (INDEPENDENT_AMBULATORY_CARE_PROVIDER_SITE_OTHER): Payer: Medicare Other | Admitting: Physical Therapy

## 2014-08-08 DIAGNOSIS — M6281 Muscle weakness (generalized): Secondary | ICD-10-CM | POA: Diagnosis present

## 2014-08-08 DIAGNOSIS — M25512 Pain in left shoulder: Secondary | ICD-10-CM

## 2014-08-08 NOTE — Therapy (Signed)
[place], [place], [address] Phone: [phone]   Fax:  [phone]  Physical Therapy Treatment  Patient Details  Name: Abigail [name] MRN: [id number] Date of Birth: December 03, 1940 Referring Provider:  Garald Balding, MD  Encounter Date: 08/08/2014      PT End of Session - 08/08/14 1405    Visit Number 7   Number of Visits 8   Date for PT Re-Evaluation 08/08/14   PT Start Time 1402   PT Stop Time 1452   PT Time Calculation (min) 50 min   Activity Tolerance Patient tolerated treatment well      Past Medical History  Diagnosis Date  . CAD (coronary artery disease)   . Diabetes mellitus   . Obesity   . Fibromyalgia   . Family hx of colon cancer   . Depression   . Hypertension   . Obesity   . GERD (gastroesophageal reflux disease)     pt denies  . Chronic kidney disease     low kidney function; BUN was high acc to pt  . PONV (postoperative nausea and vomiting)     with stapedectomy  . Torn rotator cuff left    current    Past Surgical History  Procedure Laterality Date  . Cholecystectomy    . Dilation and curettage of uterus    . Tubal ligation    . Stapedectomy    . Shoulder surgery      bilateral rotator cuff repairs    There were no vitals filed for this visit.  Visit Diagnosis:  Muscle weakness (generalized)  Pain in joint, shoulder region, left      Subjective Assessment - 08/08/14 1406    Symptoms Pt reported she had mylogram for back last week. Pt reports being able to reach in cabinets and brush hair with left arm with greater ease.    Currently in Pain? Yes   Pain Score 1    Pain Location Shoulder   Pain Orientation Left   Pain Descriptors / Indicators Aching   Aggravating Factors  Reaching overhead.    Pain Relieving Factors Rest; not over doing it.    Multiple Pain Sites Yes   Pain Score 4   Pain Location Back   Pain Descriptors / Indicators Sharp   Pain Onset --  When weight bearing.             [facility] PT Assessment - 08/08/14 0001    Observation/Other Assessments   Focus on Therapeutic Outcomes (FOTO)  35%    ROM / Strength   AROM / PROM / Strength AROM;Strength   AROM   AROM Assessment Site Shoulder   Right/Left Shoulder Left   Left Shoulder Flexion 161 Degrees   Left Shoulder ABduction 155 Degrees   Left Shoulder Internal Rotation 80 Degrees   Left Shoulder External Rotation 62 Degrees   Strength   Strength Assessment Site Shoulder   Right/Left Shoulder Left   Left Shoulder Flexion 4+/5   Left Shoulder ABduction 4+/5   Left Shoulder Internal Rotation 5/5   Left Shoulder External Rotation --  5-/5                   [facility] Adult PT Treatment/Exercise - 08/08/14 0001    Exercises   Exercises Shoulder   Shoulder Exercises: Supine   Horizontal ABduction Strengthening;Left;20 reps;Theraband   Theraband Level (Shoulder Horizontal ABduction) Level 1 (Yellow)   External Rotation Strengthening;Both;20 reps;Theraband  Theraband Level (Shoulder External Rotation) Level 1 (Yellow)   Other Supine Exercises D2 Flex with yellow band x 10 x 2 sets    Shoulder Exercises: Standing   Internal Rotation Strengthening;Left;20 reps;Theraband   Theraband Level (Shoulder Internal Rotation) Level 2 (Red)   Flexion Strengthening;Left;20 reps;Weights   Shoulder Flexion Weight (lbs) 2   ABduction Strengthening;Left;20 reps   Shoulder ABduction Weight (lbs) 2   Row Strengthening;Left;20 reps;Theraband   Theraband Level (Shoulder Row) Level 2 (Red)   Modalities   Modalities Cryotherapy;Moist Heat   Moist Heat Therapy   Number Minutes Moist Heat 15 Minutes   Moist Heat Location Other (comment)  low back   Cryotherapy   Number Minutes Cryotherapy 15 Minutes   Cryotherapy Location Shoulder   Type of Cryotherapy --  vaso, 3 snow flakes, med pressure.           Plan - 08/08/14 1454    Clinical Impression Statement Pt  tolerated increased resistance with therapeutic exercise this visit. Pt has made great progress since beginning PT. Pt has met all but 2 goals, but states she is satisfied with her current level of function.  Pt  reports is interested in D/C to HEP at this time.    Rehab Potential Good   PT Next Visit Plan Spoke to supervising PT.  Will D/C to HEP.    Consulted and Agree with Plan of Care Patient           PT Long Term Goals - 08/08/14 1436    PT LONG TERM GOAL #1   Title Demonstrate and/or verbalize techniques to reduce the risk of reinjury to include information on posture and body mechanics.    Time 4   Period Weeks   Status Achieved   PT LONG TERM GOAL #2   Title Be independent with advanced HEP    Time 4   Period Weeks   Status Achieved   PT LONG TERM GOAL #3   Title Report pain decrease =/>50% with Lt shoulder movement.    Time 4   Period Weeks   Status Achieved   PT LONG TERM GOAL #4   Time 4   Period Weeks   Status Partially Met  pt's ER measures 62 degrees.  Pt reports she is satisfied with this level of function.    PT LONG TERM GOAL #5   Title Improve FOTO =/< 34% limited (CJ level)    Time 4   Period Weeks   Status Partially Met  Scored 35%, CJ level.    PT LONG TERM GOAL #6   Title Increase strength in Lt shoulder =/>4+/5    Time 4   Period Weeks   Status Achieved         Problem List Patient Active Problem List   Diagnosis Date Noted  . Acute encephalopathy 02/05/2013  . Anemia 02/05/2013  . Acute renal failure 02/05/2013  . Dehydration 09/12/2012  . Diabetes mellitus 09/12/2012  . Hypertension 09/12/2012  . Hypothyroidism 09/12/2012  . Hyperlipidemia 09/12/2012   Kerin Perna, PTA 08/08/2014 2:49 PM   [place], [place], [address] Phone: [phone]   Fax:  [phone]

## 2014-08-11 DIAGNOSIS — M545 Low back pain: Secondary | ICD-10-CM | POA: Diagnosis not present

## 2014-08-11 DIAGNOSIS — M4806 Spinal stenosis, lumbar region: Secondary | ICD-10-CM | POA: Diagnosis not present

## 2014-08-11 DIAGNOSIS — M5417 Radiculopathy, lumbosacral region: Secondary | ICD-10-CM | POA: Diagnosis not present

## 2014-08-11 DIAGNOSIS — Z6837 Body mass index (BMI) 37.0-37.9, adult: Secondary | ICD-10-CM | POA: Diagnosis not present

## 2014-08-11 DIAGNOSIS — M4317 Spondylolisthesis, lumbosacral region: Secondary | ICD-10-CM | POA: Diagnosis not present

## 2014-08-13 ENCOUNTER — Other Ambulatory Visit: Payer: Self-pay | Admitting: Neurosurgery

## 2014-08-13 DIAGNOSIS — E119 Type 2 diabetes mellitus without complications: Secondary | ICD-10-CM | POA: Diagnosis not present

## 2014-08-13 DIAGNOSIS — N183 Chronic kidney disease, stage 3 (moderate): Secondary | ICD-10-CM | POA: Diagnosis not present

## 2014-08-13 NOTE — Therapy (Signed)
[place], [place], [address] Phone: [phone]   Fax:  [phone]  Physical Therapy Treatment  Patient Details  Name: Abigail [name] MRN: [id number] Date of Birth: 1941-04-09 Referring Provider:  Garald Balding, MD  Encounter Date: 08/08/2014    Past Medical History  Diagnosis Date  . CAD (coronary artery disease)   . Diabetes mellitus   . Obesity   . Fibromyalgia   . Family hx of colon cancer   . Depression   . Hypertension   . Obesity   . GERD (gastroesophageal reflux disease)     pt denies  . Chronic kidney disease     low kidney function; BUN was high acc to pt  . PONV (postoperative nausea and vomiting)     with stapedectomy  . Torn rotator cuff left    current    Past Surgical History  Procedure Laterality Date  . Cholecystectomy    . Dilation and curettage of uterus    . Tubal ligation    . Stapedectomy    . Shoulder surgery      bilateral rotator cuff repairs    There were no vitals filed for this visit.  Visit Diagnosis:  Muscle weakness (generalized)  Pain in joint, shoulder region, left                                  PT Long Term Goals - 08/08/14 1436    PT LONG TERM GOAL #1   Title Demonstrate and/or verbalize techniques to reduce the risk of reinjury to include information on posture and body mechanics.    Time 4   Period Weeks   Status Achieved   PT LONG TERM GOAL #2   Title Be independent with advanced HEP    Time 4   Period Weeks   Status Achieved   PT LONG TERM GOAL #3   Title Report pain decrease =/>50% with Lt shoulder movement.    Time 4   Period Weeks   Status Achieved   PT LONG TERM GOAL #4   Time 4   Period Weeks   Status Partially Met  pt's ER measures 62 degrees.  Pt reports she is satisfied with this level of function.    PT LONG TERM GOAL #5   Title Improve FOTO =/< 34% limited (CJ level)    Time  4   Period Weeks   Status Partially Met  Scored 35%, CJ level.    PT LONG TERM GOAL #6   Title Increase strength in Lt shoulder =/>4+/5    Time 4   Period Weeks   Status Achieved               Problem List Patient Active Problem List   Diagnosis Date Noted  . Acute encephalopathy 02/05/2013  . Anemia 02/05/2013  . Acute renal failure 02/05/2013  . Dehydration 09/12/2012  . Diabetes mellitus 09/12/2012  . Hypertension 09/12/2012  . Hypothyroidism 09/12/2012  . Hyperlipidemia 09/12/2012  PHYSICAL THERAPY DISCHARGE SUMMARY  Visits from Start of Care: 7  Current functional level related to goals / functional outcomes: See above   Remaining deficits: Lacking some shoulder ER however patient reports she is pleased with her motion and ready for D/C   Education / Equipment: HEP Plan: Patient agrees to discharge.  Patient goals were partially met. Patient is being discharged due to  being pleased with the current functional level.  ?????      Jeral Pinch, PT 08/13/2014, 8:13 AM  [facility] [place], [place], [address] Phone: [phone]   Fax:  [phone]

## 2014-09-01 DIAGNOSIS — G5601 Carpal tunnel syndrome, right upper limb: Secondary | ICD-10-CM | POA: Diagnosis not present

## 2014-09-01 DIAGNOSIS — Z79899 Other long term (current) drug therapy: Secondary | ICD-10-CM | POA: Diagnosis not present

## 2014-09-01 DIAGNOSIS — G894 Chronic pain syndrome: Secondary | ICD-10-CM | POA: Diagnosis not present

## 2014-09-01 DIAGNOSIS — M4806 Spinal stenosis, lumbar region: Secondary | ICD-10-CM | POA: Diagnosis not present

## 2014-09-01 DIAGNOSIS — M7061 Trochanteric bursitis, right hip: Secondary | ICD-10-CM | POA: Diagnosis not present

## 2014-09-04 DIAGNOSIS — L03032 Cellulitis of left toe: Secondary | ICD-10-CM | POA: Diagnosis not present

## 2014-09-04 DIAGNOSIS — E119 Type 2 diabetes mellitus without complications: Secondary | ICD-10-CM | POA: Diagnosis not present

## 2014-09-05 DIAGNOSIS — G5601 Carpal tunnel syndrome, right upper limb: Secondary | ICD-10-CM | POA: Diagnosis not present

## 2014-09-05 DIAGNOSIS — M7061 Trochanteric bursitis, right hip: Secondary | ICD-10-CM | POA: Diagnosis not present

## 2014-09-05 DIAGNOSIS — M4806 Spinal stenosis, lumbar region: Secondary | ICD-10-CM | POA: Diagnosis not present

## 2014-09-05 DIAGNOSIS — G5600 Carpal tunnel syndrome, unspecified upper limb: Secondary | ICD-10-CM | POA: Diagnosis not present

## 2014-09-14 ENCOUNTER — Encounter: Payer: Self-pay | Admitting: Emergency Medicine

## 2014-09-14 ENCOUNTER — Emergency Department (INDEPENDENT_AMBULATORY_CARE_PROVIDER_SITE_OTHER): Payer: Medicare Other

## 2014-09-14 ENCOUNTER — Emergency Department (INDEPENDENT_AMBULATORY_CARE_PROVIDER_SITE_OTHER)
Admission: EM | Admit: 2014-09-14 | Discharge: 2014-09-14 | Disposition: A | Discharge status: Home / Self Care | Payer: Medicare Other | Source: Home / Self Care | Attending: Family Medicine | Admitting: Family Medicine

## 2014-09-14 DIAGNOSIS — S7001XA Contusion of right hip, initial encounter: Secondary | ICD-10-CM | POA: Diagnosis not present

## 2014-09-14 DIAGNOSIS — M25551 Pain in right hip: Secondary | ICD-10-CM

## 2014-09-14 DIAGNOSIS — S79911A Unspecified injury of right hip, initial encounter: Secondary | ICD-10-CM | POA: Diagnosis not present

## 2014-09-14 DIAGNOSIS — S93401A Sprain of unspecified ligament of right ankle, initial encounter: Secondary | ICD-10-CM

## 2014-09-14 NOTE — Discharge Instructions (Signed)
Apply ice pack to right hip and right ankle for 30 minutes two to three times daily.  Elevate.  Wear ankle brace for about 2 to 3 weeks.  Begin range of motion and stretching exercises as per instruction sheet.  May take Tramadol at bedtime with Tylenol as needed for pain.

## 2014-09-14 NOTE — ED Provider Notes (Signed)
CSN: [id number]     Arrival date & time 09/14/14  1319 History   First MD Initiated Contact with Patient 09/14/14 1350     Chief Complaint  Patient presents with  . Hip Pain      HPI Comments: While on a trip four days ago, patient fell from a single step, landing on her right side on gravel.  She has had soreness in her right elbow, leg, and ankle, but is primarily concerned about persistent pain in her right hip.  Patient is a 74 y.o. female presenting with hip pain. The history is provided by the patient.  Hip Pain This is a new problem. Episode onset: 4 days ago. The problem has been gradually improving. Associated symptoms comments: Soreness in right elbow, leg, and ankle. The symptoms are aggravated by walking. Nothing relieves the symptoms. She has tried nothing for the symptoms.    Past Medical History  Diagnosis Date  . CAD (coronary artery disease)   . Diabetes mellitus   . Obesity   . Fibromyalgia   . Family hx of colon cancer   . Depression   . Hypertension   . Obesity   . GERD (gastroesophageal reflux disease)     pt denies  . Chronic kidney disease     low kidney function; BUN was high acc to pt  . PONV (postoperative nausea and vomiting)     with stapedectomy  . Torn rotator cuff left    current   Past Surgical History  Procedure Laterality Date  . Cholecystectomy    . Dilation and curettage of uterus    . Tubal ligation    . Stapedectomy    . Shoulder surgery      bilateral rotator cuff repairs   Family History  Problem Relation Age of Onset  . Colon cancer Brother   . Diabetes Mother     and Sister   . Uterine cancer Sister    History  Substance Use Topics  . Smoking status: Never Smoker   . Smokeless tobacco: Never Used  . Alcohol Use: No   OB History    No data available     Review of Systems  All other systems reviewed and are negative.   Allergies  Onglyza; Polysporin; Cephalosporins; Lisinopril; Penicillins; Victoza; and  Mucinex  Home Medications   Prior to Admission medications   Medication Sig Start Date End Date Taking? Authorizing Provider  amLODipine (NORVASC) 10 MG tablet Take 5 mg by mouth every morning. Take 1/2 tablet daily    Historical Provider, MD  aspirin EC 81 MG tablet Take 81 mg by mouth every evening.    Historical Provider, MD  B Complex-C (B-COMPLEX WITH VITAMIN C) tablet Take 1 tablet by mouth every evening.    Historical Provider, MD  carvedilol (COREG) 6.25 MG tablet Take 6.25 mg by mouth every morning.     Historical Provider, MD  Cranberry-Vitamin C-Probiotic (AZO CRANBERRY) 250-30 MG TABS Take 1 tablet by mouth at bedtime.     Historical Provider, MD  Cyanocobalamin (VITAMIN B-12 IJ) Inject 1,000 mg as directed every 28 (twenty-eight) days.     Historical Provider, MD  ESTRACE VAGINAL 0.1 MG/GM vaginal cream Place 1 Applicatorful vaginally 2 (two) times a week. Apply at bedtime on Monday and Friday. 06/10/14   Historical Provider, MD  fluticasone (FLONASE) 50 MCG/ACT nasal spray Place 1 spray into the nose daily as needed for allergies.     Historical Provider, MD  gabapentin (NEURONTIN)  400 MG capsule Take 1 capsule (400 mg total) by mouth 3 (three) times daily. 02/06/13   Domenic Polite, MD  Ginkgo Biloba Extract 60 MG CAPS Take 1 capsule by mouth daily.    Historical Provider, MD  Insulin Pen Needle 31G X 5 MM MISC  01/11/13   Historical Provider, MD  LEVEMIR FLEXTOUCH 100 UNIT/ML Pen Inject 50 Units into the skin every morning.  07/25/14   Historical Provider, MD  levothyroxine (SYNTHROID, LEVOTHROID) 150 MCG tablet Take 150 mcg by mouth daily. 07/25/14   Historical Provider, MD  losartan-hydrochlorothiazide (HYZAAR) 100-25 MG per tablet Take 1 tablet by mouth daily.  07/25/14   Historical Provider, MD  Melatonin 10 MG TABS Take 1 tablet by mouth at bedtime.    Historical Provider, MD  metaxalone (SKELAXIN) 800 MG tablet Take one tab by mouth at bedtime. Patient not taking: Reported on  08/04/2014 10/20/13   Kandra Nicolas, MD  Multiple Vitamin (MULTIVITAMIN) tablet Take 1 tablet by mouth every evening.     Historical Provider, MD  Multiple Vitamins-Minerals (THERA-M) TABS Take 1 tablet by mouth daily.    Historical Provider, MD  nortriptyline (PAMELOR) 25 MG capsule Take 2 capsules (50 mg total) by mouth at bedtime. Patient not taking: Reported on 08/04/2014 02/06/13   Domenic Polite, MD  nystatin-triamcinolone ointment [facility]) Apply 1 application topically 2 (two) times daily. Patient not taking: Reported on 08/04/2014 06/28/14   Kandra Nicolas, MD  oxybutynin (DITROPAN-XL) 10 MG 24 hr tablet Take 5 mg by mouth every evening.     Historical Provider, MD  potassium chloride (K-DUR,KLOR-CON) 10 MEQ tablet Take 10 mEq by mouth every evening.    Historical Provider, MD  pravastatin (PRAVACHOL) 80 MG tablet Take 80 mg by mouth every morning.  12/03/10   Historical Provider, MD  Probiotic Product (PROBIOTIC DAILY PO) Take 1 capsule by mouth daily.    Historical Provider, MD  sertraline (ZOLOFT) 100 MG tablet Take 150 mg by mouth every morning.     Historical Provider, MD  silver sulfADIAZINE (SILVADENE) 1 % cream Apply 1 application topically daily as needed. As directed 06/19/14   Historical Provider, MD  tobramycin-dexamethasone [facility]) ophthalmic solution Place 1 drop into the left eye 4 (four) times daily.  06/10/14   Historical Provider, MD  traMADol (ULTRAM) 50 MG tablet Take 50 mg by mouth every 6 (six) hours as needed.     Historical Provider, MD  trimethoprim (TRIMPEX) 100 MG tablet Take 100 mg by mouth daily.    Historical Provider, MD  Vitamin D, Ergocalciferol, (DRISDOL) 50000 UNITS CAPS Take 50,000 Units by mouth every 30 (thirty) days. In the middle of the month 09/27/10   Historical Provider, MD   BP 154/72 mmHg  Pulse 53  Temp(Src) 98.6 F (37 C) (Oral)  Resp 17  Ht 5' 3.5" (1.613 m)  Wt 214 lb (97.07 kg)  BMI 37.31 kg/m2  SpO2 97% Physical Exam  Constitutional: She  is oriented to person, place, and time. She appears well-developed and well-nourished. No distress.  Patient is obese (BMI 37.3)  HENT:  Head: Atraumatic.  Eyes: Pupils are equal, round, and reactive to light.  Neck: Normal range of motion.  Cardiovascular: Normal heart sounds.   Pulmonary/Chest: Breath sounds normal.  Abdominal: There is no tenderness.  Musculoskeletal:       Right hip: She exhibits tenderness. She exhibits normal range of motion, normal strength, no bony tenderness, no swelling and no deformity.  Right ankle: She exhibits normal range of motion, no swelling, no ecchymosis, no deformity, no laceration and normal pulse. Tenderness. Lateral malleolus tenderness found. No head of 5th metatarsal tenderness found. Achilles tendon normal.       Legs: There is tenderness to palpation over the right buttock and hip area without tenderness over the greater trochanter.  Right hip has full range of motion.  Right ankle has no swelling, but mild tenderness over lateral malleolus.  Right elbow has full range of motion without swelling and minimal tenderness to palpation.  Neurological: She is alert and oriented to person, place, and time.  Skin: Skin is warm and dry.  Nursing note and vitals reviewed.   ED Course  Procedures  none  Imaging Review Dg Hip Unilat With Pelvis 2-3 Views Right  09/14/2014   CLINICAL DATA:  Status post fall 4 days ago. Continued right hip pain.  EXAM: RIGHT HIP (WITH PELVIS) 2-3 VIEWS  COMPARISON:  None.  FINDINGS: No acute bony or joint abnormality is identified. No notable degenerative change is seen about the hips, symphysis pubis or SI joints. No focal bony lesion is identified.  IMPRESSION: Negative exam.   Electronically Signed   By: Inge Rise M.D.   On: 09/14/2014 14:41     MDM   1. Contusion of right hip, initial encounter   2. Hip pain, acute, right   3. Right ankle sprain, initial encounter    Ankle brace applied right  ankle. Apply ice pack to right hip and right ankle for 30 minutes two to three times daily.  Elevate.  Wear ankle brace for about 2 to 3 weeks.  Begin range of motion and stretching exercises as per instruction sheet.  May take Tramadol at bedtime with Tylenol as needed for pain. Followup with Family Doctor if not improved in two weeks.    Kandra Nicolas, MD 09/20/14 1021

## 2014-09-14 NOTE — ED Notes (Signed)
Reports falling while on trip 4 days ago; hit right side of body and is sore in elbow, leg, ankle; mostly concerned about hip. No pain prn today.

## 2014-09-22 DIAGNOSIS — M79671 Pain in right foot: Secondary | ICD-10-CM | POA: Diagnosis not present

## 2014-09-22 DIAGNOSIS — Z0001 Encounter for general adult medical examination with abnormal findings: Secondary | ICD-10-CM | POA: Diagnosis not present

## 2014-09-22 DIAGNOSIS — E039 Hypothyroidism, unspecified: Secondary | ICD-10-CM | POA: Diagnosis not present

## 2014-09-22 DIAGNOSIS — E1140 Type 2 diabetes mellitus with diabetic neuropathy, unspecified: Secondary | ICD-10-CM | POA: Diagnosis not present

## 2014-09-22 DIAGNOSIS — E782 Mixed hyperlipidemia: Secondary | ICD-10-CM | POA: Diagnosis not present

## 2014-09-22 DIAGNOSIS — E559 Vitamin D deficiency, unspecified: Secondary | ICD-10-CM | POA: Diagnosis not present

## 2014-09-22 DIAGNOSIS — N189 Chronic kidney disease, unspecified: Secondary | ICD-10-CM | POA: Diagnosis not present

## 2014-09-22 DIAGNOSIS — N390 Urinary tract infection, site not specified: Secondary | ICD-10-CM | POA: Diagnosis not present

## 2014-09-22 DIAGNOSIS — E538 Deficiency of other specified B group vitamins: Secondary | ICD-10-CM | POA: Diagnosis not present

## 2014-09-22 DIAGNOSIS — I10 Essential (primary) hypertension: Secondary | ICD-10-CM | POA: Diagnosis not present

## 2014-09-22 DIAGNOSIS — J309 Allergic rhinitis, unspecified: Secondary | ICD-10-CM | POA: Diagnosis not present

## 2014-09-22 DIAGNOSIS — Z1389 Encounter for screening for other disorder: Secondary | ICD-10-CM | POA: Diagnosis not present

## 2014-10-03 DIAGNOSIS — N764 Abscess of vulva: Secondary | ICD-10-CM | POA: Diagnosis not present

## 2014-10-15 DIAGNOSIS — H01002 Unspecified blepharitis right lower eyelid: Secondary | ICD-10-CM | POA: Diagnosis not present

## 2014-10-15 DIAGNOSIS — H01005 Unspecified blepharitis left lower eyelid: Secondary | ICD-10-CM | POA: Diagnosis not present

## 2014-10-15 DIAGNOSIS — H01001 Unspecified blepharitis right upper eyelid: Secondary | ICD-10-CM | POA: Diagnosis not present

## 2014-10-15 DIAGNOSIS — H0015 Chalazion left lower eyelid: Secondary | ICD-10-CM | POA: Diagnosis not present

## 2014-10-15 DIAGNOSIS — H01004 Unspecified blepharitis left upper eyelid: Secondary | ICD-10-CM | POA: Diagnosis not present

## 2014-10-23 ENCOUNTER — Other Ambulatory Visit (HOSPITAL_COMMUNITY): Payer: Medicare Other

## 2014-10-28 DIAGNOSIS — M792 Neuralgia and neuritis, unspecified: Secondary | ICD-10-CM | POA: Diagnosis not present

## 2014-10-29 DIAGNOSIS — M4317 Spondylolisthesis, lumbosacral region: Secondary | ICD-10-CM | POA: Diagnosis not present

## 2014-10-29 DIAGNOSIS — I10 Essential (primary) hypertension: Secondary | ICD-10-CM | POA: Diagnosis not present

## 2014-10-29 DIAGNOSIS — Z6837 Body mass index (BMI) 37.0-37.9, adult: Secondary | ICD-10-CM | POA: Diagnosis not present

## 2014-10-29 DIAGNOSIS — M5417 Radiculopathy, lumbosacral region: Secondary | ICD-10-CM | POA: Diagnosis not present

## 2014-10-29 DIAGNOSIS — M4806 Spinal stenosis, lumbar region: Secondary | ICD-10-CM | POA: Diagnosis not present

## 2014-10-29 NOTE — Pre-Procedure Instructions (Signed)
Abigail [name]  10/29/2014     [id number] - [place], [place] - [address] [place] [address] Phone: [phone] Fax: [phone]    Your procedure is scheduled on: Friday November 07, 2014 at 10:35 AM.  Report to [facility] Admitting at 7:35 A.M.  Call this number if you have problems the morning of surgery: [phone]    Remember:  Do not eat food or drink liquids after midnight.  Take these medicines the morning of surgery with A SIP OF WATER: Amlodipine (Norvasc), Carvedilol (Coreg), Nasal spray, Gabapentin (Neurontin), Levothyroxine (Synthroid), Sertraline (Zoloft), Tramadol (Ultram) if needed   Please stop taking any aspirin, herbal medications, vitamins, Ibuprofen, etc on Friday June 3rd   Take 25 units of Levemir the morning of your surgery   Do not wear jewelry, make-up or nail polish.  Do not wear lotions, powders, or perfumes.    Do not shave 48 hours prior to surgery.    Do not bring valuables to the hospital.  [facility] is not responsible for any belongings or valuables.  Contacts, dentures or bridgework may not be worn into surgery.  Leave your suitcase in the car.  After surgery it may be brought to your room.  For patients admitted to the hospital, discharge time will be determined by your treatment team.  Patients discharged the day of surgery will not be allowed to drive home.   Name and phone number of your driver:    Special instructions: Shower using CHG soap the night before and the morning of your surgery  Please read over the following fact sheets that you were given. Pain Booklet, Coughing and Deep Breathing, Blood Transfusion Information, MRSA Information and Surgical Site Infection Prevention

## 2014-10-30 ENCOUNTER — Encounter (HOSPITAL_COMMUNITY)
Admission: RE | Admit: 2014-10-30 | Discharge: 2014-10-30 | Disposition: A | Discharge status: Home / Self Care | Payer: Medicare Other | Source: Ambulatory Visit | Attending: Neurosurgery | Admitting: Neurosurgery

## 2014-10-30 ENCOUNTER — Encounter (HOSPITAL_COMMUNITY): Payer: Self-pay

## 2014-10-30 HISTORY — DX: Hypothyroidism, unspecified: E03.9

## 2014-10-30 HISTORY — DX: Nocturia: R35.1

## 2014-10-30 HISTORY — DX: Personal history of urinary (tract) infections: Z87.440

## 2014-10-30 HISTORY — DX: Cardiac murmur, unspecified: R01.1

## 2014-10-30 HISTORY — DX: Furuncle, unspecified: L02.92

## 2014-10-30 HISTORY — DX: Hyperlipidemia, unspecified: E78.5

## 2014-10-30 HISTORY — DX: Unspecified disorder of eye and adnexa: H57.9

## 2014-10-30 HISTORY — DX: Other general symptoms and signs: R68.89

## 2014-10-30 HISTORY — DX: Frequency of micturition: R35.0

## 2014-10-30 HISTORY — DX: Drug induced constipation: K59.03

## 2014-10-30 LAB — CBC
HCT: 38.6 % (ref 36.0–46.0)
Hemoglobin: 13.3 g/dL (ref 12.0–15.0)
MCH: 29.8 pg (ref 26.0–34.0)
MCHC: 34.5 g/dL (ref 30.0–36.0)
MCV: 86.5 fL (ref 78.0–100.0)
Platelets: 235 10*3/uL (ref 150–400)
RBC: 4.46 MIL/uL (ref 3.87–5.11)
RDW: 14 % (ref 11.5–15.5)
WBC: 8 10*3/uL (ref 4.0–10.5)

## 2014-10-30 LAB — BASIC METABOLIC PANEL
Anion gap: 10 (ref 5–15)
BUN: 26 mg/dL — ABNORMAL HIGH (ref 6–20)
CO2: 25 mmol/L (ref 22–32)
Calcium: 9.4 mg/dL (ref 8.9–10.3)
Chloride: 103 mmol/L (ref 101–111)
Creatinine, Ser: 1.3 mg/dL — ABNORMAL HIGH (ref 0.44–1.00)
GFR calc Af Amer: 46 mL/min — ABNORMAL LOW (ref >60)
GFR calc non Af Amer: 40 mL/min — ABNORMAL LOW (ref >60)
Glucose, Bld: 139 mg/dL — ABNORMAL HIGH (ref 65–99)
Potassium: 3.9 mmol/L (ref 3.5–5.1)
Sodium: 138 mmol/L (ref 135–145)

## 2014-10-30 LAB — TYPE AND SCREEN
ABO/RH(D): A NEG
Antibody Screen: NEGATIVE

## 2014-10-30 LAB — SURGICAL PCR SCREEN
MRSA, PCR: NEGATIVE
Staphylococcus aureus: NEGATIVE

## 2014-10-30 LAB — ABO/RH: ABO/RH(D): A NEG

## 2014-10-30 LAB — GLUCOSE, CAPILLARY: Glucose-Capillary: 196 mg/dL — ABNORMAL HIGH (ref 65–99)

## 2014-10-30 NOTE — Progress Notes (Signed)
   10/30/14 1235  OBSTRUCTIVE SLEEP APNEA  Have you ever been diagnosed with sleep apnea through a sleep study? No  Do you snore loudly (loud enough to be heard through closed doors)?  0  Do you often feel tired, fatigued, or sleepy during the daytime? 1  Has anyone observed you stop breathing during your sleep? 0  Do you have, or are you being treated for high blood pressure? 1  BMI more than 35 kg/m2? 1  Age over 73 years old? 1  Neck circumference greater than 40 cm/16 inches? 1  Gender: 0  Obstructive Sleep Apnea Score 5   This patient has screened at risk for sleep apnea using the STOP bang tool used during a pre-surgical visit. A score of 4 or greater is at room for sleep apnea.

## 2014-10-30 NOTE — Progress Notes (Signed)
PCP is Lona Kettle, Nephrologist is Elwyn Lade in [place] [phone]) and Cardiologist is Jenell Milliner. Patient stated she has not seen Dr. Verl Blalock in approximately three or four years. Patient informed Nurse that she had a stress test in February 2015 at [facility]. Will request records. Nurse also inquired about blood glucose and patient stated this morning her blood sugar was 154, and the highest her blood glucose has been was 200 and the lowest was 52. Blood sugar obtained during PAT was 196. Patient informed Nurse that he blood sugar can run low at times IF she does not eat, and Nurse explained in detail "how to manage your diabetes before surgery." Patient verbalized understanding. Patient stated her last A1C was 7.0. Husband at chairside during PAT visit.

## 2014-10-31 LAB — HEMOGLOBIN A1C
Hgb A1c MFr Bld: 7.2 % — ABNORMAL HIGH (ref 4.8–5.6)
Mean Plasma Glucose: 160 mg/dL

## 2014-10-31 NOTE — Progress Notes (Signed)
Anesthesia Chart Review:  Pt is 74 year old female scheduled for L4-5 maximum access PLIF on 11/07/2014 with Dr. Vertell Limber.   PCP is Dr. Myriam Jacobson.   PMH includes: CAD (this is listed in history, but I do not see documentation of actual disease; pt had normal stress test in 2009 by notes, and again in 2015 (see below)), CKD (stage 3), DM, hyperlipidemia, HTN, heart murmur (unspecified). Never smoker. BMI 38.  Medications include: ASA, carvedilol, insulin, levothyroxine, losartan-hctz, pravachol  Preoperative labs reviewed.  Glucose 139. HgbA1c 7.2  EKG 10/30/2014: sinus bradycardia (47 bpm).   Nuclear stress test 07/24/2013 (see report in care everywhere):  -There is normal physiologic distribution of activity throughout the left ventricular myocardium. Wall motion analysis reveals no regional wall motion abnormality. Left ventricular ejection fraction is calculated to be 65%.  If no changes, I anticipate pt can proceed with surgery as scheduled.   Willeen Cass, FNP-BC [facility]/Anesthesiology Phone: [phone] 10/31/2014 2:02 PM

## 2014-11-06 MED ORDER — VANCOMYCIN HCL IN DEXTROSE 1-5 GM/200ML-% IV SOLN
1000.0000 mg | INTRAVENOUS | Status: AC
  Administered 2014-11-07: 1000 mg via INTRAVENOUS
  Filled 2014-11-06: qty 200

## 2014-11-06 NOTE — H&P (Signed)
Patient ID:   [id number] Patient: Abigail [name]  Date of Birth: [birth year] Visit Type: Office Visit   Date: 10/29/2014 02:15 PM Provider: Marchia Meiers. Vertell Limber MD   This 74 year old female presents for back pain.  History of Present Illness: 1.  back pain  Patient comes in with her son today she was extremely anxious about her scheduled surgery.  We went over the attendant risks and benefits of surgery as well as her preoperative imaging and the exact surgical plan.  We also went over models of the surgery and answered her and her son's questions.  Face-to-face clinical time was in excess of 30 minutes.  The patient describes a history of perineal infections in the past and I told her to speak with PA at preoperative assessment with regard to whether or not she is MRSA carrier.  Patient is allergic to penicillin so she will require vancomycin anyway.  Plan is to proceed with L4 L5 MAS PLIF procedure.  Risks and benefits were discussed in detail with the patient and with her son.  She has been fitted for LSO brace.  We went over models and detailed teaching.  We also discussed the need for consideration for a brief postoperative stay in a nursing facility for rehabilitation since she is the primary caregiver to her husband who has early dementia.      Medical/Surgical/Interim History Reviewed, no change.  Last detailed document date:02/10/2014.   PAST MEDICAL HISTORY, SURGICAL HISTORY, FAMILY HISTORY, SOCIAL HISTORY AND REVIEW OF SYSTEMS I have reviewed the patient's past medical, surgical, family and social history as well as the comprehensive review of systems as included on the [facility] history form dated 02/10/2014, which I have signed.  Family History: Reviewed, no changes.  Last detailed document: 02/10/2014.   Social History: Tobacco use reviewed. Reviewed, no changes. Last detailed document date: 02/10/2014.      MEDICATIONS(added, continued or  stopped this visit): Started Medication Directions Instruction Stopped   Amitiza 8 mcg capsule take 1 capsule by oral route 2 times every day with food and water     amlodipine 10 mg tablet take 0.5 tablet by oral route  every day     aspirin 81 mg tablet,delayed release take 1 tablet by oral route  every day     Coreg 6.25 mg tablet take 1 tablet by oral route  every day with food     cranberry 405 mg capsule 1 capsule daily     gabapentin 400 mg capsule take 1 capsule by oral route 4 times every day     ginkgo biloba 1 tablet daily     Levemir FlexTouch 100 unit/mL (3 mL) subcutaneous insulin pen inject by subcutaneous route per prescriber's instructions. Insulin dosing requires individualization.     levothyroxine 150 mcg tablet take 1 tablet by oral route  every day     losartan 100 mg-hydrochlorothiazide 25 mg tablet take 1 tablet by oral route  every day     melatonin 10 mg tablet 1 tablet in the evening     multivitamin tablet 1 tablet daily     oxybutynin chloride 5 mg tablet take 1 tablet by oral route  every day     pravastatin 80 mg tablet take 1 tablet by oral route  every day     probiotic Once daily     tramadol 50 mg tablet take 1 tablet by oral route  every 4 - 6 hours as needed  Vitamin B-12 1,000 mcg/mL injection solution inject 1 milliliter by intramuscular route  every month     Zoloft 100 mg tablet take 1.5 tablets by oral route  every day       ALLERGIES: Ingredient Reaction Medication Name Comment  BACITRACIN  Neosporin (neo-bac-polym)   NEOMYCIN SULFATE  Neosporin (neo-bac-polym)   POLYMYXIN B  Neosporin (neo-bac-polym)   PENICILLINS Hives    BACITRACIN ZINC  Neosporin (neo-bac-polym)    Reviewed, no changes.    Vitals Date Temp F BP Pulse Ht In Wt Lb BMI BSA Pain Score  10/29/2014  161/71 60 63.5 217 37.84  6/10      IMPRESSION Proceed with L4 L5 and ES PLIF procedure.  Completed Orders (this encounter) Order Details Reason Side Interpretation  Result Initial Treatment Date Region  Hypertension education Follow up with primary care physician.        Lifestyle education regarding diet Encouraged to eat a well balanced diet and follow up with primary care physician.         Assessment/Plan # Detail Type Description   1. Assessment Spondylolisthesis, lumbosacral region (M43.17).       2. Assessment Spinal stenosis of lumbar region (M48.06).       3. Assessment Radiculopathy, lumbosacral region (M54.17).       4. Assessment Essential (primary) hypertension (I10).       5. Assessment Body mass index (BMI) 37.0-37.9, adult (Z68.37).   Plan Orders Today's instructions / counseling include(s) Lifestyle education regarding diet.         Pain Assessment/Treatment Pain Scale: 6/10. Method: Numeric Pain Intensity Scale. Location: back. Onset: 02/10/2009. Duration: varies. Quality: discomforting. Pain Assessment/Treatment follow-up plan of care: Patient is taking medications as prescribed..  Fall Risk Plan The Patient has fallen 1 times in the last year.  Falls risk follow-up plan of care: Assisted devices: Advised patient to use handrails..  Surgery as scheduled  Orders: Instruction(s)/Education: Assessment Instruction  I10 Hypertension education  [phone] Lifestyle education regarding diet             Provider:  Marchia Meiers. Vertell Limber MD  10/30/2014 10:39 AM Dictation edited by: Marchia Meiers. Vertell Limber    CC Providers: Dian Situ [facility] [address], [place] [address]-              Electronically signed by Marchia Meiers Vertell Limber MD on 10/30/2014 10:39 AM  Patient ID:   [id number] Patient: Abigail [name]  Date of Birth: Apr 01, 1941 Visit Type: Office Visit   Date: 08/11/2014 03:30 PM Provider: Marchia Meiers. Vertell Limber MD   This 74 year old female presents for back pain.  History of Present Illness: 1.  back pain  Patient returns to review her CT myelogram.  On review of  imaging studies, the patient appears to have marked stenosis and spondylolisthesis of L4 and L5.  She has milder degenerative changes at the L5-S1 level which are eccentric to the left.  She has an enlarged nerve sheath on the right at L3 L4 with disc bulge without frank stenosis.  Her myelogram shows waisting around the thecal sac with L5 nerve root compassion bilaterally at the L4-L5 level and she has significant mobile spondylolisthesis of L4 and L5 with standing and with flexion and extension views.  The patient is complaining of more right than left leg pain.  She is complaining of severe pain.  Based on my review for imaging studies I recommended decompression and fusion at the L4 L5  level alone rather than including either L3 L4 or L5-S1 levels.  This will involve MAS PLIF procedure at L4 L5.  Risks and benefits were discussed in detail with the patient and she was given a prescription for a lumbosacral orthosis.  The patient wishes to proceed with surgery in the second half of May.      Medical/Surgical/Interim History Reviewed, no change.  Last detailed document date:02/10/2014.   Family History: Reviewed, no changes.  Last detailed document: 02/10/2014.   Social History: Tobacco use reviewed. Reviewed, no changes. Last detailed document date: 02/10/2014.      MEDICATIONS(added, continued or stopped this visit): Started Medication Directions Instruction Stopped   Amitiza 8 mcg capsule take 1 capsule by oral route 2 times every day with food and water     amlodipine 10 mg tablet take 0.5 tablet by oral route  every day     aspirin 81 mg tablet,delayed release take 1 tablet by oral route  every day     Coreg 6.25 mg tablet take 1 tablet by oral route  every day with food     cranberry 405 mg capsule 1 capsule daily     gabapentin 400 mg capsule take 1 capsule by oral route 4 times every day     ginkgo biloba 1 tablet daily     Levemir FlexTouch 100 unit/mL (3 mL) subcutaneous  insulin pen inject by subcutaneous route per prescriber's instructions. Insulin dosing requires individualization.     levothyroxine 150 mcg tablet take 1 tablet by oral route  every day     losartan 100 mg-hydrochlorothiazide 25 mg tablet take 1 tablet by oral route  every day     melatonin 10 mg tablet 1 tablet in the evening     multivitamin tablet 1 tablet daily     oxybutynin chloride 5 mg tablet take 1 tablet by oral route  every day     pravastatin 80 mg tablet take 1 tablet by oral route  every day     probiotic Once daily     tramadol 50 mg tablet take 1 tablet by oral route  every 4 - 6 hours as needed     Vitamin B-12 1,000 mcg/mL injection solution inject 1 milliliter by intramuscular route  every month     Zoloft 100 mg tablet take 1.5 tablets by oral route  every day       ALLERGIES: Ingredient Reaction Medication Name Comment  BACITRACIN  Neosporin (neo-bac-polym)   NEOMYCIN SULFATE  Neosporin (neo-bac-polym)   POLYMYXIN B  Neosporin (neo-bac-polym)   PENICILLINS Hives    BACITRACIN ZINC  Neosporin (neo-bac-polym)       Vitals Date Temp F BP Pulse Ht In Wt Lb BMI BSA Pain Score  08/11/2014  111/66 58 63.5 215.8 37.63  3/10     DIAGNOSTIC RESULTS Diagnostic report text  CLINICAL DATA: Low back pain extending into the lower extremities bilaterally, right greater than left.  EXAM: LUMBAR MYELOGRAM  FLUOROSCOPY TIME: dictate in minutes and seconds  PROCEDURE: Lumbar puncture and intrathecal contrast administration were performed by Dr. Vertell Limber who will separately report for the portion of the procedure. I personally supervised acquisition of the myelogram images.  TECHNIQUE: Contiguous axial images were obtained through the Lumbar spine after the intrathecal infusion of infusion. Coronal and sagittal reconstructions were obtained of the axial image sets.  COMPARISON: Lumbar spine radiographs 02/10/2014. CT lumbar spine 10/11/2013.  FINDINGS: LUMBAR  MYELOGRAM FINDINGS:  High 8 broad-based disc protrusion  at L4-5 leads to moderate subarticular stenosis bilaterally, left greater than right. The nerve roots above and below this level fill normally. The perineural root sleeve cyst is incidentally noted on the right at L3-4. Minimal left subarticular narrowing is present at L3-4.  The lateral images demonstrate grade 1 anterolisthesis at L4-5 measuring 7 mm. Shallow disc protrusions are present at L1-2, L2-3, and L3-4. The anterolisthesis is slightly exaggerated with standing and flexion. There slight reduction and extension. Alignment is otherwise stable. The disc protrusion at L3-4 is worse with standing. Shallow disc protrusions at L1-2 and L2-3 are not significantly changed.  CT LUMBAR MYELOGRAM FINDINGS:  The conus medullaris terminates at L1-2, within normal limits. Grade 1 anterolisthesis at L4-5 is diminished in the supine position, measuring only 4 mm. There is a vacuum disc at the L4-5 level compatible with motion.  Limited imaging the abdomen demonstrates mild atherosclerotic calcifications. There is no significant aneurysm or stenosis. Degenerative changes are noted in the SI joints bilaterally.  T12-L1: The perineural cyst is present on the right. There is no significant disc protrusion or stenosis.  L1-2: Mild disc bulging is present. Bilateral perineural root sleeve cysts are evident. There is no significant stenosis.  L2-3: A mild disc bulge is present. There is mild facet hypertrophy and ligamentum flavum thickening bilaterally without significant stenosis.  L3-4: A mild broad-based disc protrusion is present. A perineural root sleeve cyst is present on the right. Mild facet hypertrophy and ligamentum flavum thickening is evident.  L4-5: Advanced facet hypertrophy is present. Degenerative facet changes are present bilaterally. There is a broad-based disc protrusion with mild subarticular narrowing, worse on  the left. Mild foraminal narrowing is worse on the left as well.  L5-S1: A shallow leftward disc protrusion is present. There is mild left subarticular narrowing. The foramina are patent  IMPRESSION: LUMBAR MYELOGRAM IMPRESSION:  1. Dynamic anterolisthesis at L4-5 with uncovering of a broad-based disc protrusion resulting in moderate left and mild right subarticular stenosis. 2. A broad-based disc protrusion at L3-4 is slightly worse with standing. There is mild left subarticular stenosis at this level. 3. Mild disc protrusions at L1-2 and L2-3 are not significantly changed with standing.  CT LUMBAR MYELOGRAM IMPRESSION:  1. Dynamic anterolisthesis at L4-5 is diminished in the supine position. 2. Mild subarticular and foraminal narrowing bilaterally at L4-5 is worse on the left. 3. Mild broad-based disc protrusion and facet hypertrophy at L3-4 without significant stenosis evident on the CT scan. 4. Shallow leftward disc protrusion at L5-S1 with mild left subarticular narrowing.   Electronically Signed By: San Morelle M.D. On: 08/05/2014 14:24    IMPRESSION Mobile Spondylolisthesis of L4 on L5 with severe spinal stenosis at this level with milder degenerative changes at L3 L4 and L5-S1 levels.  Completed Orders (this encounter) Order Details Reason Side Interpretation Result Initial Treatment Date Region  Dietary management education, guidance, and counseling Encouraged to eat a well balanced diet and follow up with primary care physician.         Assessment/Plan # Detail Type Description   1. Assessment Body mass index (BMI) 37.0-37.9, adult (Z68.37).   Plan Orders Today's instructions / counseling include(s) Dietary management education, guidance, and counseling.         Pain Assessment/Treatment Pain Scale: 3/10. Method: Numeric Pain Intensity Scale. Onset: 02/10/2009.  I recommend MAS PLIF L4 L5 level.  Risks and benefits were discussed in detail with  the patient and she wishes to proceed with surgery.  Orders: Instruction(s)/Education:  Assessment Instruction  [phone] Dietary management education, guidance, and counseling             Provider:  Marchia Meiers. Vertell Limber MD  08/12/2014 10:33 AM Dictation edited by: Marchia Meiers. Vertell Limber    CC Providers: Dian Situ [facility] [address], [place] [address]-              Electronically signed by Marchia Meiers Vertell Limber MD on 08/12/2014 10:34 AM

## 2014-11-07 ENCOUNTER — Inpatient Hospital Stay (HOSPITAL_COMMUNITY)
Admission: RE | Admit: 2014-11-07 | Discharge: 2014-11-12 | DRG: 460 | Disposition: A | Discharge status: Skilled Nursing Facility | Payer: Medicare Other | Source: Ambulatory Visit | Attending: Neurosurgery | Admitting: Neurosurgery

## 2014-11-07 ENCOUNTER — Inpatient Hospital Stay (HOSPITAL_COMMUNITY): Payer: Medicare Other | Admitting: Emergency Medicine

## 2014-11-07 ENCOUNTER — Inpatient Hospital Stay (HOSPITAL_COMMUNITY): Payer: Medicare Other | Admitting: Certified Registered"

## 2014-11-07 ENCOUNTER — Encounter (HOSPITAL_COMMUNITY)
Admission: RE | Disposition: A | Discharge status: Skilled Nursing Facility | Payer: Medicare Other | Source: Ambulatory Visit | Attending: Neurosurgery

## 2014-11-07 ENCOUNTER — Inpatient Hospital Stay (HOSPITAL_COMMUNITY): Payer: Medicare Other

## 2014-11-07 ENCOUNTER — Encounter (HOSPITAL_COMMUNITY): Payer: Self-pay | Admitting: Certified Registered"

## 2014-11-07 DIAGNOSIS — R262 Difficulty in walking, not elsewhere classified: Secondary | ICD-10-CM | POA: Diagnosis not present

## 2014-11-07 DIAGNOSIS — M4316 Spondylolisthesis, lumbar region: Secondary | ICD-10-CM | POA: Diagnosis not present

## 2014-11-07 DIAGNOSIS — Z881 Allergy status to other antibiotic agents status: Secondary | ICD-10-CM

## 2014-11-07 DIAGNOSIS — M5116 Intervertebral disc disorders with radiculopathy, lumbar region: Secondary | ICD-10-CM | POA: Diagnosis present

## 2014-11-07 DIAGNOSIS — Z01810 Encounter for preprocedural cardiovascular examination: Secondary | ICD-10-CM | POA: Diagnosis not present

## 2014-11-07 DIAGNOSIS — Z7982 Long term (current) use of aspirin: Secondary | ICD-10-CM

## 2014-11-07 DIAGNOSIS — E785 Hyperlipidemia, unspecified: Secondary | ICD-10-CM | POA: Diagnosis present

## 2014-11-07 DIAGNOSIS — N183 Chronic kidney disease, stage 3 (moderate): Secondary | ICD-10-CM | POA: Diagnosis present

## 2014-11-07 DIAGNOSIS — E118 Type 2 diabetes mellitus with unspecified complications: Secondary | ICD-10-CM | POA: Diagnosis not present

## 2014-11-07 DIAGNOSIS — I129 Hypertensive chronic kidney disease with stage 1 through stage 4 chronic kidney disease, or unspecified chronic kidney disease: Secondary | ICD-10-CM | POA: Diagnosis present

## 2014-11-07 DIAGNOSIS — Z981 Arthrodesis status: Secondary | ICD-10-CM | POA: Diagnosis not present

## 2014-11-07 DIAGNOSIS — Z794 Long term (current) use of insulin: Secondary | ICD-10-CM | POA: Diagnosis not present

## 2014-11-07 DIAGNOSIS — M4806 Spinal stenosis, lumbar region: Secondary | ICD-10-CM | POA: Diagnosis present

## 2014-11-07 DIAGNOSIS — M545 Low back pain: Secondary | ICD-10-CM | POA: Diagnosis not present

## 2014-11-07 DIAGNOSIS — F329 Major depressive disorder, single episode, unspecified: Secondary | ICD-10-CM | POA: Diagnosis not present

## 2014-11-07 DIAGNOSIS — M4326 Fusion of spine, lumbar region: Secondary | ICD-10-CM | POA: Diagnosis not present

## 2014-11-07 DIAGNOSIS — Z880 Allergy status to penicillin: Secondary | ICD-10-CM

## 2014-11-07 DIAGNOSIS — R410 Disorientation, unspecified: Secondary | ICD-10-CM | POA: Diagnosis not present

## 2014-11-07 DIAGNOSIS — E669 Obesity, unspecified: Secondary | ICD-10-CM | POA: Diagnosis present

## 2014-11-07 DIAGNOSIS — L8990 Pressure ulcer of unspecified site, unspecified stage: Secondary | ICD-10-CM | POA: Diagnosis present

## 2014-11-07 DIAGNOSIS — R41841 Cognitive communication deficit: Secondary | ICD-10-CM | POA: Diagnosis not present

## 2014-11-07 DIAGNOSIS — R32 Unspecified urinary incontinence: Secondary | ICD-10-CM | POA: Diagnosis not present

## 2014-11-07 DIAGNOSIS — E1122 Type 2 diabetes mellitus with diabetic chronic kidney disease: Secondary | ICD-10-CM | POA: Diagnosis present

## 2014-11-07 DIAGNOSIS — R278 Other lack of coordination: Secondary | ICD-10-CM | POA: Diagnosis not present

## 2014-11-07 DIAGNOSIS — I959 Hypotension, unspecified: Secondary | ICD-10-CM | POA: Diagnosis not present

## 2014-11-07 DIAGNOSIS — N189 Chronic kidney disease, unspecified: Secondary | ICD-10-CM | POA: Diagnosis not present

## 2014-11-07 DIAGNOSIS — E039 Hypothyroidism, unspecified: Secondary | ICD-10-CM | POA: Diagnosis not present

## 2014-11-07 DIAGNOSIS — M4310 Spondylolisthesis, site unspecified: Secondary | ICD-10-CM

## 2014-11-07 DIAGNOSIS — Z6837 Body mass index (BMI) 37.0-37.9, adult: Secondary | ICD-10-CM | POA: Diagnosis not present

## 2014-11-07 DIAGNOSIS — M6281 Muscle weakness (generalized): Secondary | ICD-10-CM | POA: Diagnosis not present

## 2014-11-07 DIAGNOSIS — Z01812 Encounter for preprocedural laboratory examination: Secondary | ICD-10-CM

## 2014-11-07 DIAGNOSIS — I10 Essential (primary) hypertension: Secondary | ICD-10-CM | POA: Diagnosis not present

## 2014-11-07 HISTORY — PX: MAXIMUM ACCESS (MAS)POSTERIOR LUMBAR INTERBODY FUSION (PLIF) 1 LEVEL: SHX6368

## 2014-11-07 LAB — GLUCOSE, CAPILLARY
Glucose-Capillary: 151 mg/dL — ABNORMAL HIGH (ref 65–99)
Glucose-Capillary: 78 mg/dL (ref 65–99)
Glucose-Capillary: 67 mg/dL (ref 65–99)
Glucose-Capillary: 127 mg/dL — ABNORMAL HIGH (ref 65–99)

## 2014-11-07 SURGERY — FOR MAXIMUM ACCESS (MAS) POSTERIOR LUMBAR INTERBODY FUSION (PLIF) 1 LEVEL
Anesthesia: General | Site: Spine Lumbar

## 2014-11-07 MED ORDER — SUFENTANIL CITRATE 50 MCG/ML IV SOLN
INTRAVENOUS | Status: AC
  Filled 2014-11-07: qty 1

## 2014-11-07 MED ORDER — SODIUM CHLORIDE 0.9 % IJ SOLN: 3.0000 mL | INTRAMUSCULAR | Status: DC | PRN

## 2014-11-07 MED ORDER — PROMETHAZINE HCL 25 MG/ML IJ SOLN
6.2500 mg | INTRAMUSCULAR | Status: DC | PRN
Start: 2014-11-07 — End: 2014-11-07

## 2014-11-07 MED ORDER — SODIUM CHLORIDE 0.9 % IV SOLN
INTRAVENOUS | Status: DC
  Administered 2014-11-07 (×2): via INTRAVENOUS
  Administered 2014-11-08: 10 mL/h via INTRAVENOUS

## 2014-11-07 MED ORDER — LOSARTAN POTASSIUM 50 MG PO TABS
100.0000 mg | ORAL_TABLET | Freq: Every day | ORAL | Status: DC
  Administered 2014-11-07 – 2014-11-12 (×3): 100 mg via ORAL
  Filled 2014-11-07 (×5): qty 2

## 2014-11-07 MED ORDER — SODIUM CHLORIDE 0.9 % IJ SOLN
3.0000 mL | Freq: Two times a day (BID) | INTRAMUSCULAR | Status: DC
  Administered 2014-11-07 – 2014-11-11 (×8): 3 mL via INTRAVENOUS

## 2014-11-07 MED ORDER — ONE-DAILY MULTI VITAMINS PO TABS
1.0000 | ORAL_TABLET | Freq: Every evening | ORAL | Status: DC
  Administered 2014-11-07 – 2014-11-11 (×5): 1 via ORAL
  Filled 2014-11-07 (×6): qty 1

## 2014-11-07 MED ORDER — FLUTICASONE PROPIONATE 50 MCG/ACT NA SUSP
1.0000 | Freq: Every day | NASAL | Status: DC | PRN
  Filled 2014-11-07: qty 16

## 2014-11-07 MED ORDER — HYDROMORPHONE HCL 1 MG/ML IJ SOLN
INTRAMUSCULAR | Status: AC
  Filled 2014-11-07: qty 1

## 2014-11-07 MED ORDER — HYDROCODONE-ACETAMINOPHEN 5-325 MG PO TABS
1.0000 | ORAL_TABLET | ORAL | Status: DC | PRN
  Administered 2014-11-08: 2 via ORAL
  Filled 2014-11-07: qty 2

## 2014-11-07 MED ORDER — VITAMIN D (ERGOCALCIFEROL) 1.25 MG (50000 UNIT) PO CAPS
50000.0000 [IU] | ORAL_CAPSULE | ORAL | Status: DC
  Administered 2014-11-07: 50000 [IU] via ORAL
  Filled 2014-11-07: qty 1

## 2014-11-07 MED ORDER — LIDOCAINE HCL (CARDIAC) 20 MG/ML IV SOLN
INTRAVENOUS | Status: DC | PRN
  Administered 2014-11-07: 80 mg via INTRAVENOUS

## 2014-11-07 MED ORDER — MELATONIN 10 MG PO TABS: 1.0000 | ORAL_TABLET | Freq: Every day | ORAL | Status: DC

## 2014-11-07 MED ORDER — LIDOCAINE HCL (CARDIAC) 20 MG/ML IV SOLN
INTRAVENOUS | Status: AC
  Filled 2014-11-07: qty 5

## 2014-11-07 MED ORDER — GABAPENTIN 400 MG PO CAPS
400.0000 mg | ORAL_CAPSULE | Freq: Three times a day (TID) | ORAL | Status: DC
  Administered 2014-11-07 – 2014-11-12 (×14): 400 mg via ORAL
  Filled 2014-11-07 (×15): qty 1

## 2014-11-07 MED ORDER — BISACODYL 10 MG RE SUPP
10.0000 mg | Freq: Every day | RECTAL | Status: DC | PRN
  Administered 2014-11-10: 10 mg via RECTAL
  Filled 2014-11-07: qty 1

## 2014-11-07 MED ORDER — DOCUSATE SODIUM 100 MG PO CAPS
100.0000 mg | ORAL_CAPSULE | Freq: Two times a day (BID) | ORAL | Status: DC
  Administered 2014-11-07 – 2014-11-12 (×11): 100 mg via ORAL
  Filled 2014-11-07 (×11): qty 1

## 2014-11-07 MED ORDER — BUPIVACAINE LIPOSOME 1.3 % IJ SUSP
20.0000 mL | INTRAMUSCULAR | Status: DC
  Filled 2014-11-07: qty 20

## 2014-11-07 MED ORDER — SUFENTANIL CITRATE 50 MCG/ML IV SOLN
INTRAVENOUS | Status: DC | PRN
  Administered 2014-11-07: 10 ug via INTRAVENOUS
  Administered 2014-11-07: 20 ug via INTRAVENOUS

## 2014-11-07 MED ORDER — MUPIROCIN 2 % EX OINT
1.0000 "application " | TOPICAL_OINTMENT | Freq: Every day | CUTANEOUS | Status: DC | PRN
  Filled 2014-11-07: qty 22

## 2014-11-07 MED ORDER — AMLODIPINE BESYLATE 5 MG PO TABS
5.0000 mg | ORAL_TABLET | Freq: Every morning | ORAL | Status: DC
  Administered 2014-11-08 – 2014-11-11 (×2): 5 mg via ORAL
  Filled 2014-11-07 (×3): qty 1

## 2014-11-07 MED ORDER — OXYBUTYNIN CHLORIDE ER 10 MG PO TB24
10.0000 mg | ORAL_TABLET | Freq: Every day | ORAL | Status: DC
  Administered 2014-11-07 – 2014-11-11 (×3): 10 mg via ORAL
  Filled 2014-11-07 (×7): qty 1

## 2014-11-07 MED ORDER — ACETAMINOPHEN 325 MG PO TABS
650.0000 mg | ORAL_TABLET | ORAL | Status: DC | PRN
  Administered 2014-11-08 – 2014-11-09 (×3): 650 mg via ORAL
  Filled 2014-11-07 (×4): qty 2

## 2014-11-07 MED ORDER — SENNOSIDES-DOCUSATE SODIUM 8.6-50 MG PO TABS
1.0000 | ORAL_TABLET | Freq: Every evening | ORAL | Status: DC | PRN
Start: 2014-11-07 — End: 2014-11-12

## 2014-11-07 MED ORDER — 0.9 % SODIUM CHLORIDE (POUR BTL) OPTIME
TOPICAL | Status: DC | PRN
  Administered 2014-11-07: 1000 mL

## 2014-11-07 MED ORDER — AZO CRANBERRY 250-30 MG PO TABS: 1.0000 | ORAL_TABLET | Freq: Every day | ORAL | Status: DC

## 2014-11-07 MED ORDER — B COMPLEX-C PO TABS
1.0000 | ORAL_TABLET | Freq: Every evening | ORAL | Status: DC
  Administered 2014-11-07 – 2014-11-11 (×4): 1 via ORAL
  Filled 2014-11-07 (×6): qty 1

## 2014-11-07 MED ORDER — LOSARTAN POTASSIUM-HCTZ 100-25 MG PO TABS: 1.0000 | ORAL_TABLET | Freq: Every day | ORAL | Status: DC

## 2014-11-07 MED ORDER — INSULIN ASPART 100 UNIT/ML ~~LOC~~ SOLN
0.0000 [IU] | Freq: Three times a day (TID) | SUBCUTANEOUS | Status: DC
  Administered 2014-11-08 (×3): 3 [IU] via SUBCUTANEOUS
  Administered 2014-11-09: 2 [IU] via SUBCUTANEOUS
  Administered 2014-11-09: 8 [IU] via SUBCUTANEOUS
  Administered 2014-11-10 (×3): 3 [IU] via SUBCUTANEOUS
  Administered 2014-11-11: 5 [IU] via SUBCUTANEOUS
  Administered 2014-11-11: 3 [IU] via SUBCUTANEOUS
  Administered 2014-11-11: 2 [IU] via SUBCUTANEOUS
  Administered 2014-11-12: 5 [IU] via SUBCUTANEOUS
  Administered 2014-11-12: 3 [IU] via SUBCUTANEOUS

## 2014-11-07 MED ORDER — HYDROCHLOROTHIAZIDE 25 MG PO TABS
25.0000 mg | ORAL_TABLET | Freq: Every day | ORAL | Status: DC
  Administered 2014-11-09 – 2014-11-11 (×2): 25 mg via ORAL
  Filled 2014-11-07 (×4): qty 1

## 2014-11-07 MED ORDER — ONDANSETRON HCL 4 MG/2ML IJ SOLN: 4.0000 mg | INTRAMUSCULAR | Status: DC | PRN

## 2014-11-07 MED ORDER — LIDOCAINE-EPINEPHRINE 1 %-1:100000 IJ SOLN
INTRAMUSCULAR | Status: DC | PRN
  Administered 2014-11-07: 5 mL

## 2014-11-07 MED ORDER — EPHEDRINE SULFATE 50 MG/ML IJ SOLN
INTRAMUSCULAR | Status: DC | PRN
  Administered 2014-11-07 (×2): 10 mg via INTRAVENOUS
  Administered 2014-11-07 (×2): 5 mg via INTRAVENOUS
  Administered 2014-11-07: 10 mg via INTRAVENOUS

## 2014-11-07 MED ORDER — SURGIFOAM 100 EX MISC
CUTANEOUS | Status: DC | PRN
  Administered 2014-11-07: 13:00:00 via TOPICAL

## 2014-11-07 MED ORDER — MIDAZOLAM HCL 5 MG/5ML IJ SOLN
INTRAMUSCULAR | Status: DC | PRN
  Administered 2014-11-07: 2 mg via INTRAVENOUS

## 2014-11-07 MED ORDER — FLEET ENEMA 7-19 GM/118ML RE ENEM: 1.0000 | ENEMA | Freq: Once | RECTAL | Status: AC | PRN

## 2014-11-07 MED ORDER — TRAMADOL HCL 50 MG PO TABS
50.0000 mg | ORAL_TABLET | Freq: Four times a day (QID) | ORAL | Status: DC | PRN
  Administered 2014-11-09 – 2014-11-12 (×9): 50 mg via ORAL
  Filled 2014-11-07 (×9): qty 1

## 2014-11-07 MED ORDER — ALUM & MAG HYDROXIDE-SIMETH 200-200-20 MG/5ML PO SUSP: 30.0000 mL | Freq: Four times a day (QID) | ORAL | Status: DC | PRN

## 2014-11-07 MED ORDER — TRIMETHOPRIM 100 MG PO TABS
100.0000 mg | ORAL_TABLET | Freq: Every day | ORAL | Status: DC
  Administered 2014-11-07 – 2014-11-11 (×4): 100 mg via ORAL
  Filled 2014-11-07 (×7): qty 1

## 2014-11-07 MED ORDER — INSULIN ASPART 100 UNIT/ML ~~LOC~~ SOLN
0.0000 [IU] | Freq: Every day | SUBCUTANEOUS | Status: DC
  Administered 2014-11-09: 2 [IU] via SUBCUTANEOUS

## 2014-11-07 MED ORDER — METHOCARBAMOL 1000 MG/10ML IJ SOLN
500.0000 mg | Freq: Four times a day (QID) | INTRAVENOUS | Status: DC | PRN
  Filled 2014-11-07: qty 5

## 2014-11-07 MED ORDER — ONDANSETRON HCL 4 MG/2ML IJ SOLN
INTRAMUSCULAR | Status: DC | PRN
  Administered 2014-11-07: 4 mg via INTRAVENOUS

## 2014-11-07 MED ORDER — SODIUM CHLORIDE 0.9 % IV SOLN: 250.0000 mL | INTRAVENOUS | Status: DC

## 2014-11-07 MED ORDER — CARVEDILOL 6.25 MG PO TABS
6.2500 mg | ORAL_TABLET | Freq: Two times a day (BID) | ORAL | Status: DC
  Administered 2014-11-07 – 2014-11-11 (×7): 6.25 mg via ORAL
  Filled 2014-11-07 (×9): qty 1

## 2014-11-07 MED ORDER — BUPIVACAINE LIPOSOME 1.3 % IJ SUSP
INTRAMUSCULAR | Status: DC | PRN
  Administered 2014-11-07: 20 mL

## 2014-11-07 MED ORDER — SERTRALINE HCL 50 MG PO TABS
150.0000 mg | ORAL_TABLET | Freq: Every morning | ORAL | Status: DC
  Administered 2014-11-08 – 2014-11-12 (×5): 150 mg via ORAL
  Filled 2014-11-07 (×10): qty 1

## 2014-11-07 MED ORDER — PROPOFOL 10 MG/ML IV BOLUS
INTRAVENOUS | Status: DC | PRN
  Administered 2014-11-07: 160 mg via INTRAVENOUS

## 2014-11-07 MED ORDER — STERILE WATER FOR INJECTION IJ SOLN
INTRAMUSCULAR | Status: AC
  Filled 2014-11-07: qty 30

## 2014-11-07 MED ORDER — PHENOL 1.4 % MT LIQD: 1.0000 | OROMUCOSAL | Status: DC | PRN

## 2014-11-07 MED ORDER — PROPOFOL 10 MG/ML IV BOLUS
INTRAVENOUS | Status: AC
  Filled 2014-11-07: qty 20

## 2014-11-07 MED ORDER — HYDROMORPHONE HCL 1 MG/ML IJ SOLN
0.5000 mg | INTRAMUSCULAR | Status: DC | PRN
  Administered 2014-11-07 – 2014-11-08 (×7): 1 mg via INTRAVENOUS
  Filled 2014-11-07 (×9): qty 1

## 2014-11-07 MED ORDER — MEPERIDINE HCL 25 MG/ML IJ SOLN: 6.2500 mg | INTRAMUSCULAR | Status: DC | PRN

## 2014-11-07 MED ORDER — MENTHOL 3 MG MT LOZG: 1.0000 | LOZENGE | OROMUCOSAL | Status: DC | PRN

## 2014-11-07 MED ORDER — ASPIRIN EC 81 MG PO TBEC
81.0000 mg | DELAYED_RELEASE_TABLET | Freq: Every evening | ORAL | Status: DC
  Administered 2014-11-07 – 2014-11-11 (×5): 81 mg via ORAL
  Filled 2014-11-07 (×6): qty 1

## 2014-11-07 MED ORDER — MIDAZOLAM HCL 2 MG/2ML IJ SOLN
INTRAMUSCULAR | Status: AC
  Filled 2014-11-07: qty 2

## 2014-11-07 MED ORDER — PANTOPRAZOLE SODIUM 40 MG IV SOLR
40.0000 mg | Freq: Every day | INTRAVENOUS | Status: DC
  Administered 2014-11-07: 40 mg via INTRAVENOUS
  Filled 2014-11-07 (×2): qty 40

## 2014-11-07 MED ORDER — SODIUM CHLORIDE 0.9 % IJ SOLN
INTRAMUSCULAR | Status: AC
  Filled 2014-11-07: qty 10

## 2014-11-07 MED ORDER — LEVOTHYROXINE SODIUM 50 MCG PO TABS
150.0000 ug | ORAL_TABLET | Freq: Every day | ORAL | Status: DC
  Administered 2014-11-07 – 2014-11-12 (×6): 150 ug via ORAL
  Filled 2014-11-07 (×13): qty 1

## 2014-11-07 MED ORDER — ESTRADIOL 0.1 MG/GM VA CREA
1.0000 | TOPICAL_CREAM | VAGINAL | Status: DC
  Administered 2014-11-10: 1 via VAGINAL
  Filled 2014-11-07 (×2): qty 42.5

## 2014-11-07 MED ORDER — VANCOMYCIN HCL IN DEXTROSE 1-5 GM/200ML-% IV SOLN
1000.0000 mg | Freq: Once | INTRAVENOUS | Status: AC
  Administered 2014-11-08: 1000 mg via INTRAVENOUS
  Filled 2014-11-07: qty 200

## 2014-11-07 MED ORDER — METHOCARBAMOL 500 MG PO TABS
500.0000 mg | ORAL_TABLET | Freq: Four times a day (QID) | ORAL | Status: DC | PRN
  Administered 2014-11-07 – 2014-11-12 (×5): 500 mg via ORAL
  Filled 2014-11-07 (×6): qty 1

## 2014-11-07 MED ORDER — PRAVASTATIN SODIUM 40 MG PO TABS
80.0000 mg | ORAL_TABLET | Freq: Every morning | ORAL | Status: DC
  Administered 2014-11-08 – 2014-11-12 (×5): 80 mg via ORAL
  Filled 2014-11-07 (×5): qty 2

## 2014-11-07 MED ORDER — SUCCINYLCHOLINE CHLORIDE 20 MG/ML IJ SOLN
INTRAMUSCULAR | Status: DC | PRN
  Administered 2014-11-07: 100 mg via INTRAVENOUS

## 2014-11-07 MED ORDER — EPHEDRINE SULFATE 50 MG/ML IJ SOLN
INTRAMUSCULAR | Status: AC
  Filled 2014-11-07: qty 2

## 2014-11-07 MED ORDER — BUPIVACAINE HCL (PF) 0.5 % IJ SOLN
INTRAMUSCULAR | Status: DC | PRN
  Administered 2014-11-07: 5 mL

## 2014-11-07 MED ORDER — POTASSIUM CHLORIDE IN NACL 20-0.9 MEQ/L-% IV SOLN
INTRAVENOUS | Status: DC
  Administered 2014-11-07: 16:00:00 via INTRAVENOUS
  Filled 2014-11-07 (×2): qty 1000

## 2014-11-07 MED ORDER — HYDROMORPHONE HCL 1 MG/ML IJ SOLN
0.2500 mg | INTRAMUSCULAR | Status: DC | PRN
  Administered 2014-11-07 (×4): 0.5 mg via INTRAVENOUS

## 2014-11-07 MED ORDER — OXYCODONE-ACETAMINOPHEN 5-325 MG PO TABS
1.0000 | ORAL_TABLET | ORAL | Status: DC | PRN
  Administered 2014-11-07 – 2014-11-08 (×3): 2 via ORAL
  Administered 2014-11-09: 1 via ORAL
  Administered 2014-11-09 – 2014-11-10 (×3): 2 via ORAL
  Filled 2014-11-07 (×5): qty 2
  Filled 2014-11-07: qty 1
  Filled 2014-11-07: qty 2
  Filled 2014-11-07: qty 1

## 2014-11-07 MED ORDER — ACETAMINOPHEN 650 MG RE SUPP: 650.0000 mg | RECTAL | Status: DC | PRN

## 2014-11-07 SURGICAL SUPPLY — 80 items
BENZOIN TINCTURE PRP APPL 2/3 (GAUZE/BANDAGES/DRESSINGS) ×2 IMPLANT
BIT DRILL PLIF MAS 5.0MM DISP (DRILL) ×1 IMPLANT
BLADE CLIPPER SURG (BLADE) IMPLANT
BONE MATRIX OSTEOCEL PRO MED (Bone Implant) ×2 IMPLANT
BUR MATCHSTICK NEURO 3.0 LAGG (BURR) ×2 IMPLANT
BUR ROUND FLUTED 5 RND (BURR) ×2 IMPLANT
CAGE COROENT PLIF 10X28-8 LUMB (Cage) ×4 IMPLANT
CANISTER SUCT 3000ML PPV (MISCELLANEOUS) ×2 IMPLANT
CLIP NEUROVISION LG (CLIP) ×2 IMPLANT
CONT SPEC 4OZ CLIKSEAL STRL BL (MISCELLANEOUS) ×4 IMPLANT
COVER BACK TABLE 24X17X13 BIG (DRAPES) IMPLANT
COVER BACK TABLE 60X90IN (DRAPES) ×2 IMPLANT
DECANTER SPIKE VIAL GLASS SM (MISCELLANEOUS) IMPLANT
DERMABOND ADVANCED (GAUZE/BANDAGES/DRESSINGS) ×1
DERMABOND ADVANCED .7 DNX12 (GAUZE/BANDAGES/DRESSINGS) ×1 IMPLANT
DRAPE C-ARM 42X72 X-RAY (DRAPES) ×2 IMPLANT
DRAPE C-ARMOR (DRAPES) ×2 IMPLANT
DRAPE LAPAROTOMY 100X72X124 (DRAPES) ×2 IMPLANT
DRAPE POUCH INSTRU U-SHP 10X18 (DRAPES) ×2 IMPLANT
DRAPE SURG 17X23 STRL (DRAPES) ×2 IMPLANT
DRILL PLIF MAS 5.0MM DISP (DRILL) ×2
DRSG OPSITE POSTOP 4X6 (GAUZE/BANDAGES/DRESSINGS) ×2 IMPLANT
DRSG TELFA 3X8 NADH (GAUZE/BANDAGES/DRESSINGS) IMPLANT
DURAPREP 26ML APPLICATOR (WOUND CARE) ×2 IMPLANT
ELECT REM PT RETURN 9FT ADLT (ELECTROSURGICAL) ×2
ELECTRODE REM PT RTRN 9FT ADLT (ELECTROSURGICAL) ×1 IMPLANT
EVACUATOR 1/8 PVC DRAIN (DRAIN) ×2 IMPLANT
GAUZE SPONGE 4X4 12PLY STRL (GAUZE/BANDAGES/DRESSINGS) ×2 IMPLANT
GAUZE SPONGE 4X4 16PLY XRAY LF (GAUZE/BANDAGES/DRESSINGS) IMPLANT
GLOVE BIO SURGEON STRL SZ8 (GLOVE) ×4 IMPLANT
GLOVE BIOGEL PI IND STRL 7.5 (GLOVE) ×1 IMPLANT
GLOVE BIOGEL PI IND STRL 8 (GLOVE) ×2 IMPLANT
GLOVE BIOGEL PI IND STRL 8.5 (GLOVE) ×2 IMPLANT
GLOVE BIOGEL PI INDICATOR 7.5 (GLOVE) ×1
GLOVE BIOGEL PI INDICATOR 8 (GLOVE) ×2
GLOVE BIOGEL PI INDICATOR 8.5 (GLOVE) ×2
GLOVE ECLIPSE 7.5 STRL STRAW (GLOVE) ×2 IMPLANT
GLOVE ECLIPSE 8.0 STRL XLNG CF (GLOVE) ×4 IMPLANT
GOWN STRL REUS W/ TWL LRG LVL3 (GOWN DISPOSABLE) IMPLANT
GOWN STRL REUS W/ TWL XL LVL3 (GOWN DISPOSABLE) ×1 IMPLANT
GOWN STRL REUS W/TWL 2XL LVL3 (GOWN DISPOSABLE) ×2 IMPLANT
GOWN STRL REUS W/TWL LRG LVL3 (GOWN DISPOSABLE)
GOWN STRL REUS W/TWL XL LVL3 (GOWN DISPOSABLE) ×1
KIT BASIN OR (CUSTOM PROCEDURE TRAY) ×2 IMPLANT
KIT NEEDLE NVM5 EMG ELECT (KITS) ×1 IMPLANT
KIT NEEDLE NVM5 EMG ELECTRODE (KITS) ×1
KIT POSITION SURG JACKSON T1 (MISCELLANEOUS) ×2 IMPLANT
KIT ROOM TURNOVER OR (KITS) ×2 IMPLANT
MILL MEDIUM DISP (BLADE) ×2 IMPLANT
NEEDLE HYPO 25X1 1.5 SAFETY (NEEDLE) ×2 IMPLANT
NEEDLE SPNL 18GX3.5 QUINCKE PK (NEEDLE) IMPLANT
NS IRRIG 1000ML POUR BTL (IV SOLUTION) ×2 IMPLANT
PACK LAMINECTOMY NEURO (CUSTOM PROCEDURE TRAY) ×2 IMPLANT
PAD ARMBOARD 7.5X6 YLW CONV (MISCELLANEOUS) ×6 IMPLANT
PATTIES SURGICAL .5 X.5 (GAUZE/BANDAGES/DRESSINGS) IMPLANT
PATTIES SURGICAL .5 X1 (DISPOSABLE) IMPLANT
PATTIES SURGICAL 1X1 (DISPOSABLE) IMPLANT
ROD 35MM (Rod) ×4 IMPLANT
SCREW LOCK (Screw) ×4 IMPLANT
SCREW LOCK FXNS SPNE MAS PL (Screw) ×4 IMPLANT
SCREW PLIF MAS 5.5X35 LUMBAR (Screw) ×4 IMPLANT
SCREW SHANK 5.0X30MM (Screw) IMPLANT
SCREW SHANK 5.0X35 (Screw) ×4 IMPLANT
SCREW TULIP 5.5 (Screw) ×4 IMPLANT
SPONGE LAP 4X18 X RAY DECT (DISPOSABLE) IMPLANT
SPONGE SURGIFOAM ABS GEL 100 (HEMOSTASIS) ×2 IMPLANT
STAPLER SKIN PROX WIDE 3.9 (STAPLE) IMPLANT
STRIP CLOSURE SKIN 1/2X4 (GAUZE/BANDAGES/DRESSINGS) ×2 IMPLANT
STRIP VITOSS 25X100X4MM (Neuro Prosthesis/Implant) ×4 IMPLANT
SUT VIC AB 1 CT1 18XBRD ANBCTR (SUTURE) ×2 IMPLANT
SUT VIC AB 1 CT1 8-18 (SUTURE) ×2
SUT VIC AB 2-0 CT1 18 (SUTURE) ×4 IMPLANT
SUT VIC AB 3-0 SH 8-18 (SUTURE) ×4 IMPLANT
SYR 20ML ECCENTRIC (SYRINGE) ×2 IMPLANT
SYR 5ML LL (SYRINGE) IMPLANT
TOWEL OR 17X24 6PK STRL BLUE (TOWEL DISPOSABLE) ×2 IMPLANT
TOWEL OR 17X26 10 PK STRL BLUE (TOWEL DISPOSABLE) ×2 IMPLANT
TRAP SPECIMEN MUCOUS 40CC (MISCELLANEOUS) ×2 IMPLANT
TRAY FOLEY W/METER SILVER 14FR (SET/KITS/TRAYS/PACK) ×2 IMPLANT
WATER STERILE IRR 1000ML POUR (IV SOLUTION) ×2 IMPLANT

## 2014-11-07 NOTE — Progress Notes (Signed)
Awake, alert, conversant.  MAEW with good strength.  Doing well. 

## 2014-11-07 NOTE — Progress Notes (Signed)
Patient admitted from PACU. Patient alert and oriented x 4. Patient oriented to room and made comfortable. 

## 2014-11-07 NOTE — Anesthesia Procedure Notes (Signed)
Procedure Name: Intubation Date/Time: 11/07/2014 9:47 AM Performed by: Melina Copa, Noella Kipnis R Pre-anesthesia Checklist: Patient identified, Emergency Drugs available, Suction available, Patient being monitored and Timeout performed Patient Re-evaluated:Patient Re-evaluated prior to inductionOxygen Delivery Method: Circle system utilized Preoxygenation: Pre-oxygenation with 100% oxygen Intubation Type: IV induction Ventilation: Mask ventilation without difficulty Laryngoscope Size: Mac and 3 Grade View: Grade III Tube type: Oral Tube size: 7.5 mm Number of attempts: 1 Airway Equipment and Method: Stylet Placement Confirmation: ETT inserted through vocal cords under direct vision,  positive ETCO2 and breath sounds checked- equal and bilateral Secured at: 21 cm Tube secured with: Tape Dental Injury: Teeth and Oropharynx as per pre-operative assessment

## 2014-11-07 NOTE — Progress Notes (Signed)
ANTIBIOTIC CONSULT NOTE - INITIAL  Pharmacy Consult for vanc Indication: post op  Allergies  Allergen Reactions  . Onglyza [Saxagliptin]     Made legs and ankles swell  . Polysporin [Bacitracin-Polymyxin B] Dermatitis    Allergic to ointments such as polysporin, neosporin, cortisporin  . Cephalosporins Other (See Comments)  . Lisinopril Cough  . Nsaids     Stage 3 kidney disease  . Penicillins Hives  . Victoza [Liraglutide]     constipation  . Mucinex [Guaifenesin Er] Rash    Patient Measurements: Height: 5\' 3"  (160 cm) Weight: 216 lb (97.977 kg) IBW/kg (Calculated) : 52.4 Adjusted Body Weight:   Vital Signs: Temp: 97.8 F (36.6 C) (06/10 1536) Temp Source: Oral (06/10 1536) BP: 128/53 mmHg (06/10 1536) Pulse Rate: 59 (06/10 1536) Intake/Output from previous day:   Intake/Output from this shift: Total I/O In: 1400 [I.V.:1400] Out: 525 [Urine:425; Blood:100]  Labs: No results for input(s): WBC, HGB, PLT, LABCREA, CREATININE in the last 72 hours. Estimated Creatinine Clearance: 43 mL/min (by C-G formula based on Cr of 1.3). No results for input(s): VANCOTROUGH, VANCOPEAK, VANCORANDOM, GENTTROUGH, GENTPEAK, GENTRANDOM, TOBRATROUGH, TOBRAPEAK, TOBRARND, AMIKACINPEAK, AMIKACINTROU, AMIKACIN in the last 72 hours.   Microbiology: Recent Results (from the past 720 hour(s))  Surgical pcr screen     Status: None   Collection Time: 10/30/14  1:05 PM  Result Value Ref Range Status   MRSA, PCR NEGATIVE NEGATIVE Final   Staphylococcus aureus NEGATIVE NEGATIVE Final    Comment:        The Xpert SA Assay (FDA approved for NASAL specimens in patients over 38 years of age), is one component of a comprehensive surveillance program.  Test performance has been validated by [facility] for patients greater than or equal to 66 year old. It is not intended to diagnose infection nor to guide or monitor treatment.     Medical History: Past Medical History  Diagnosis Date   . CAD (coronary artery disease)   . Obesity   . Fibromyalgia   . Family hx of colon cancer   . Depression   . Hypertension   . Obesity   . GERD (gastroesophageal reflux disease)     pt denies  . PONV (postoperative nausea and vomiting)     with stapedectomy  . Torn rotator cuff left    current  . Heart murmur   . Diabetes mellitus     Type 2  . Frequency of urination   . Constipation due to pain medication   . Boils     near vaginal area  . Hyperlipidemia   . Itchy eyes   . History of bladder infections   . Frequent urination at night   . Chronic kidney disease     stage 3; low kidney function; BUN was high acc to pt  . Hypothyroidism     Medications:  Scheduled:  . [START ON 11/08/2014] amLODipine  5 mg Oral q morning - 10a  . aspirin EC  81 mg Oral QPM  . AZO CRANBERRY  1 tablet Oral QHS  . B-complex with vitamin C  1 tablet Oral QPM  . carvedilol  6.25 mg Oral BID  . docusate sodium  100 mg Oral BID  . [START ON 11/10/2014] estradiol  1 Applicatorful Vaginal Once per day on Mon Thu  . gabapentin  400 mg Oral TID  . HYDROmorphone      . HYDROmorphone      . insulin aspart  0-15 Units  Subcutaneous TID WC  . insulin aspart  0-5 Units Subcutaneous QHS  . levothyroxine  150 mcg Oral Daily  . losartan-hydrochlorothiazide  1 tablet Oral Daily  . Melatonin  1 tablet Oral QHS  . multivitamin  1 tablet Oral QPM  . oxybutynin  10 mg Oral QHS  . pantoprazole (PROTONIX) IV  40 mg Intravenous QHS  . [START ON 11/08/2014] pravastatin  80 mg Oral q morning - 10a  . [START ON 11/08/2014] sertraline  150 mg Oral q morning - 10a  . sodium chloride  3 mL Intravenous Q12H  . trimethoprim  100 mg Oral QHS  . Vitamin D (Ergocalciferol)  50,000 Units Oral Q30 days   Infusions:  . sodium chloride 10 mL/hr at 11/07/14 0940  . sodium chloride    . 0.9 % NaCl with KCl 20 mEq / L     Assessment: 74 yo who is s/p lumbar fusion. Confirm with nurse and the pt does not have drain in  place. Will only need vanc x 1 post op.   Plan:   Vanc 1g IV x1 Rx will sign off  Onnie Boer, PharmD Pager: [phone] 11/07/2014 3:52 PM

## 2014-11-07 NOTE — Brief Op Note (Signed)
11/07/2014  1:44 PM  PATIENT:  Abigail [name]  74 y.o. female  PRE-OPERATIVE DIAGNOSIS:  Spondylolisthesis, Lumbar stenosis, spondylosis, herniated lumbar disc, lumbar radiculopathy L 45 level  POST-OPERATIVE DIAGNOSIS: Spondylolisthesis, Lumbar stenosis, spondylosis, herniated lumbar disc, lumbar radiculopathy L 45 level  PROCEDURE:  Procedure(s) with comments: Lumbar Four-Five Maximum access posterior lumbar interbody fusion (N/A) - L4-5 Maximum access posterior lumbar interbody fusion with PEEK cages, autograft, pedicle screw fixation, posterolateral arthrodesis  Decompression greater than for standard PLIF procedure.  SURGEON:  Surgeon(s) and Role:    * Erline Levine, MD - Primary  PHYSICIAN ASSISTANT:   ASSISTANTS: P[place], RN   ANESTHESIA:   general  EBL:  Total I/O In: 1300 [I.V.:1300] Out: 425 [Urine:325; Blood:100]  BLOOD ADMINISTERED:none  DRAINS: none   LOCAL MEDICATIONS USED:  MARCAINE    and LIDOCAINE   SPECIMEN:  No Specimen  DISPOSITION OF SPECIMEN:  N/A  COUNTS:  YES  TOURNIQUET:  * No tourniquets in log *  DICTATION: Patient is a 74 year old with stenosis, spondylolisthesis, disc herniation and severe back and right greater than left lower extremity pain at L4/5 levels of the lumbar spine. It was elected to take her to surgery for MASPLIF L 45 level with posterolateral arthrodesis.  Procedure:   Following uncomplicated induction of GETA, and placement of electrodes for neural monitoring, patient was turned into a prone position on the [place] tableand using AP  fluoroscopy the area of planned incision was marked, prepped with betadine scrub and Duraprep, then draped. Exposure was performed of facet joint complex at L 45 level and the MAS retractor was placed.5.0 x 35 mm cortical Nuvasive screws were placed at L 4 bilaterally according to standard landmarks using neural monitoring.  After assessing the trajectory of the right sided screw, it was elected to  redirect this screw more laterally using the same entry point.  The screw had good purchase without violation of the cortex.  A total laminectomy of L 4 was then performed with disarticulation of facets.  This bone was saved for grafting, combined with Osteocel after being run through bone mill and was placed in bone packing device.  Thorough discectomy was performed bilaterally at L 45 and the endplates were prepared for grafting.  Decompression was painstakingly done of all neural elements and resulted in decompression of both L 4, L 5 nerve roots and the common dural tube.  23 x 10 x 8 degree cages were placed in the interspace and positioning was confirmed with AP and lateral fluoroscopy.  10 cc of autograft/Osteocel was packed in the interspace medial to the second cage.   Remaining screws were placed at L 5 (5.5 x 35 mm) and 35 mm rods were placed.   And the screws were locked and torqued.Final Xrays showed well positioned implants and screw fixation. The posterolateral region was packed with remaining allograft bone graft material bilaterally on either side of midline. Muscular tissues were injected with Marcaine.  The wounds were irrigated and then closed with 1, 2-0 and 3-0 Vicryl stitches. Sterile occlusive dressing was placed with Dermabond and an occlusive dressing. The patient was then extubated in the operating room and taken to recovery in stable and satisfactory condition having tolerated her operation well. Counts were correct at the end of the case.  PLAN OF CARE: Admit to inpatient   PATIENT DISPOSITION:  PACU - hemodynamically stable.   Delay start of Pharmacological VTE agent (>24hrs) due to surgical blood loss or risk of bleeding: yes

## 2014-11-07 NOTE — Anesthesia Preprocedure Evaluation (Addendum)
Anesthesia Evaluation  Patient identified by MRN, date of birth, ID band Patient awake    Reviewed: Allergy & Precautions, NPO status , Patient's Chart, lab work & pertinent test results, reviewed documented beta blocker date and time   History of Anesthesia Complications (+) PONV and history of anesthetic complications  Airway Mallampati: III  TM Distance: >3 FB Neck ROM: Full    Dental no notable dental hx. (+) Teeth Intact   Pulmonary neg pulmonary ROS,  breath sounds clear to auscultation  Pulmonary exam normal       Cardiovascular hypertension, Pt. on medications and Pt. on home beta blockers + CAD Normal cardiovascular exam+ Valvular Problems/Murmurs Rhythm:Regular Rate:Normal     Neuro/Psych PSYCHIATRIC DISORDERS Depression  Neuromuscular disease    GI/Hepatic GERD-  Medicated and Controlled,  Endo/Other  diabetes, Well Controlled, Type 2, Insulin DependentHypothyroidism Obesity  Renal/GU Renal InsufficiencyRenal disease  negative genitourinary   Musculoskeletal  (+) Fibromyalgia -Lumbar stenosis Lumbar radiculopathy   Abdominal Normal abdominal exam  (+) + obese,   Peds  Hematology  (+) anemia ,   Anesthesia Other Findings   Reproductive/Obstetrics negative OB ROS                            Anesthesia Physical Anesthesia Plan  ASA: III  Anesthesia Plan: General   Post-op Pain Management:    Induction: Intravenous  Airway Management Planned: Oral ETT  Additional Equipment:   Intra-op Plan:   Post-operative Plan: Extubation in OR  Informed Consent: I have reviewed the patients History and Physical, chart, labs and discussed the procedure including the risks, benefits and alternatives for the proposed anesthesia with the patient or authorized representative who has indicated his/her understanding and acceptance.   Dental advisory given  Plan Discussed with: CRNA,  Anesthesiologist and Surgeon  Anesthesia Plan Comments:         Anesthesia Quick Evaluation

## 2014-11-07 NOTE — Interval H&P Note (Signed)
History and Physical Interval Note:  11/07/2014 7:25 AM  Abigail [name]  has presented today for surgery, with the diagnosis of Spondylolisthesis, Lumbar stenosis  The various methods of treatment have been discussed with the patient and family. After consideration of risks, benefits and other options for treatment, the patient has consented to  Procedure(s) with comments: L4-5 Maximum access posterior lumbar interbody fusion (N/A) - L4-5 Maximum access posterior lumbar interbody fusion as a surgical intervention .  The patient's history has been reviewed, patient examined, no change in status, stable for surgery.  I have reviewed the patient's chart and labs.  Questions were answered to the patient's satisfaction.     Florence Yeung D

## 2014-11-07 NOTE — Transfer of Care (Signed)
Immediate Anesthesia Transfer of Care Note  Patient: Abigail [name]  Procedure(s) Performed: Procedure(s) with comments: Lumbar Four-Five Maximum access posterior lumbar interbody fusion (N/A) - L4-5 Maximum access posterior lumbar interbody fusion  Patient Location: PACU  Anesthesia Type:General  Level of Consciousness: awake, alert , oriented and patient cooperative  Airway & Oxygen Therapy: Patient Spontanous Breathing and Patient connected to nasal cannula oxygen  Post-op Assessment: Report given to RN, Post -op Vital signs reviewed and stable and Patient moving all extremities  Post vital signs: Reviewed and stable  Last Vitals:  Filed Vitals:   11/07/14 0823  BP:   Pulse:   Temp: 36.6 C  Resp:     Complications: No apparent anesthesia complications

## 2014-11-07 NOTE — Anesthesia Postprocedure Evaluation (Signed)
  Anesthesia Post-op Note  Patient: Abigail [name]  Procedure(s) Performed: Procedure(s) with comments: Lumbar Four-Five Maximum access posterior lumbar interbody fusion (N/A) - L4-5 Maximum access posterior lumbar interbody fusion  Patient Location: PACU  Anesthesia Type:General  Level of Consciousness: awake  Airway and Oxygen Therapy: Patient Spontanous Breathing and Patient connected to nasal cannula oxygen  Post-op Pain: mild  Post-op Assessment: Post-op Vital signs reviewed, Patient's Cardiovascular Status Stable, Respiratory Function Stable, Patent Airway, No signs of Nausea or vomiting and Pain level controlled   LLE Sensation: Full sensation   RLE Sensation: Full sensation      Post-op Vital Signs: Reviewed and stable  Last Vitals:  Filed Vitals:   11/07/14 1536  BP: 128/53  Pulse: 59  Temp: 36.6 C  Resp: 14    Complications: No apparent anesthesia complications

## 2014-11-07 NOTE — Op Note (Signed)
11/07/2014  1:44 PM  PATIENT:  Abigail [name]  74 y.o. female  PRE-OPERATIVE DIAGNOSIS:  Spondylolisthesis, Lumbar stenosis, spondylosis, herniated lumbar disc, lumbar radiculopathy L 45 level  POST-OPERATIVE DIAGNOSIS: Spondylolisthesis, Lumbar stenosis, spondylosis, herniated lumbar disc, lumbar radiculopathy L 45 level  PROCEDURE:  Procedure(s) with comments: Lumbar Four-Five Maximum access posterior lumbar interbody fusion (N/A) - L4-5 Maximum access posterior lumbar interbody fusion with PEEK cages, autograft, pedicle screw fixation, posterolateral arthrodesis  Decompression greater than for standard PLIF procedure.  SURGEON:  Surgeon(s) and Role:    * Erline Levine, MD - Primary  PHYSICIAN ASSISTANT:   ASSISTANTS: Poteat, RN   ANESTHESIA:   general  EBL:  Total I/O In: 1300 [I.V.:1300] Out: 425 [Urine:325; Blood:100]  BLOOD ADMINISTERED:none  DRAINS: none   LOCAL MEDICATIONS USED:  MARCAINE    and LIDOCAINE   SPECIMEN:  No Specimen  DISPOSITION OF SPECIMEN:  N/A  COUNTS:  YES  TOURNIQUET:  * No tourniquets in log *  DICTATION: Patient is a 74 year old with stenosis, spondylolisthesis, disc herniation and severe back and right greater than left lower extremity pain at L4/5 levels of the lumbar spine. It was elected to take her to surgery for MASPLIF L 45 level with posterolateral arthrodesis.  Procedure:   Following uncomplicated induction of GETA, and placement of electrodes for neural monitoring, patient was turned into a prone position on the [place] tableand using AP  fluoroscopy the area of planned incision was marked, prepped with betadine scrub and Duraprep, then draped. Exposure was performed of facet joint complex at L 45 level and the MAS retractor was placed.5.0 x 35 mm cortical Nuvasive screws were placed at L 4 bilaterally according to standard landmarks using neural monitoring.  After assessing the trajectory of the right sided screw, it was elected to  redirect this screw more laterally using the same entry point.  The screw had good purchase without violation of the cortex.  A total laminectomy of L 4 was then performed with disarticulation of facets.  This bone was saved for grafting, combined with Osteocel after being run through bone mill and was placed in bone packing device.  Thorough discectomy was performed bilaterally at L 45 and the endplates were prepared for grafting.  Decompression was painstakingly done of all neural elements and resulted in decompression of both L 4, L 5 nerve roots and the common dural tube.  23 x 10 x 8 degree cages were placed in the interspace and positioning was confirmed with AP and lateral fluoroscopy.  10 cc of autograft/Osteocel was packed in the interspace medial to the second cage.   Remaining screws were placed at L 5 (5.5 x 35 mm) and 35 mm rods were placed.   And the screws were locked and torqued.Final Xrays showed well positioned implants and screw fixation. The posterolateral region was packed with remaining allograft bone graft material bilaterally on either side of midline. Muscular tissues were injected with Marcaine.  The wounds were irrigated and then closed with 1, 2-0 and 3-0 Vicryl stitches. Sterile occlusive dressing was placed with Dermabond and an occlusive dressing. The patient was then extubated in the operating room and taken to recovery in stable and satisfactory condition having tolerated her operation well. Counts were correct at the end of the case.  PLAN OF CARE: Admit to inpatient   PATIENT DISPOSITION:  PACU - hemodynamically stable.   Delay start of Pharmacological VTE agent (>24hrs) due to surgical blood loss or risk of bleeding: yes

## 2014-11-08 LAB — GLUCOSE, CAPILLARY
Glucose-Capillary: 178 mg/dL — ABNORMAL HIGH (ref 65–99)
Glucose-Capillary: 186 mg/dL — ABNORMAL HIGH (ref 65–99)
Glucose-Capillary: 190 mg/dL — ABNORMAL HIGH (ref 65–99)
Glucose-Capillary: 175 mg/dL — ABNORMAL HIGH (ref 65–99)

## 2014-11-08 MED ORDER — NALOXONE HCL 0.4 MG/ML IJ SOLN
0.2000 mg | INTRAMUSCULAR | Status: DC | PRN
  Administered 2014-11-08: 0.2 mg via INTRAVENOUS
  Filled 2014-11-08: qty 1

## 2014-11-08 MED ORDER — PANTOPRAZOLE SODIUM 40 MG PO TBEC
40.0000 mg | DELAYED_RELEASE_TABLET | Freq: Every day | ORAL | Status: DC
  Administered 2014-11-08 – 2014-11-12 (×5): 40 mg via ORAL
  Filled 2014-11-08 (×5): qty 1

## 2014-11-08 MED ORDER — HEPARIN SODIUM (PORCINE) 5000 UNIT/ML IJ SOLN
5000.0000 [IU] | Freq: Three times a day (TID) | INTRAMUSCULAR | Status: DC
  Administered 2014-11-08 – 2014-11-10 (×6): 5000 [IU] via SUBCUTANEOUS
  Filled 2014-11-08 (×6): qty 1

## 2014-11-08 NOTE — Progress Notes (Signed)
Patient ID: Abigail [name], female   DOB: 18-Aug-1940, 74 y.o.   MRN: [id number] Stable, unable to sleep because of the incisional pain. No weakness, sensory normal. Poor effort to get oob.will star heparin

## 2014-11-08 NOTE — Evaluation (Signed)
Physical Therapy Evaluation Patient Details Name: Abigail [name] MRN: [id number] DOB: 07/03/1940 Today's Date: 11/08/2014   History of Present Illness  74 y.o. female s/p L4-5 Maximum access posterior lumbar interbody fusion  Clinical Impression  Patient is s/p above surgery presenting with functional limitations due to the deficits listed below (see PT Problem List). Min assist to ambulate safely with a rolling walker for support. Max verbal cues throughout therapy session, pt with difficulty following commands. Quite lethargic this AM. Pt concerned her husband will not be able to provide adequate care due to his dementia, may benefit from ST-SNF prior to returning home so that patient is more functionally independent and safe. Patient will benefit from skilled PT to increase their independence and safety with mobility to allow discharge to the venue listed below.       Follow Up Recommendations SNF;Supervision/Assistance - 24 hour    Equipment Recommendations  None recommended by PT    Recommendations for Other Services       Precautions / Restrictions Precautions Precautions: Back Precaution Booklet Issued: Yes (comment) Precaution Comments: reviewed Required Braces or Orthoses: Spinal Brace Spinal Brace: Applied in sitting position Restrictions Weight Bearing Restrictions: No      Mobility  Bed Mobility Overal bed mobility: Needs Assistance Bed Mobility: Rolling;Sidelying to Sit Rolling: Min guard Sidelying to sit: Min assist       General bed mobility comments: Min assist for truncal support to rise from sidelying position. Max VC for technique and sequencing throughout task.  Transfers Overall transfer level: Needs assistance Equipment used: Rolling walker (2 wheeled) Transfers: Sit to/from Stand Sit to Stand: Min assist         General transfer comment: min assist for boost to stand. VC for hand placement. Slow to rise.  Ambulation/Gait Ambulation/Gait  assistance: Min assist Ambulation Distance (Feet): 100 Feet Assistive device: Rolling walker (2 wheeled) Gait Pattern/deviations: Step-through pattern;Decreased stride length;Drifts right/left;Trunk flexed Gait velocity: decreased   General Gait Details: Min assist intermittently for walker control. Tends to bump into walls if not corrected. No buckling or loss of balance. VC for safe RW use and upright posture.  Stairs            Wheelchair Mobility    Modified Rankin (Stroke Patients Only)       Balance Overall balance assessment: Needs assistance Sitting-balance support: No upper extremity supported Sitting balance-Leahy Scale: Fair     Standing balance support: Bilateral upper extremity supported Standing balance-Leahy Scale: Poor                               Pertinent Vitals/Pain Pain Assessment: 0-10 Pain Score: 7  Pain Location: backs Pain Descriptors / Indicators: Aching Pain Intervention(s): Monitored during session;Repositioned    Home Living Family/patient expects to be discharged to:: Unsure Living Arrangements: Spouse/significant other Available Help at Discharge: Family;Available 24 hours/day Type of Home: House Home Access: Stairs to enter Entrance Stairs-Rails: Left Entrance Stairs-Number of Steps: 4-5 Home Layout: One level Home Equipment: Walker - 4 wheels;Walker - 2 wheels;Shower seat;Grab bars - tub/shower      Prior Function Level of Independence: Independent               Hand Dominance   Dominant Hand: Right    Extremity/Trunk Assessment   Upper Extremity Assessment: Defer to OT evaluation           Lower Extremity Assessment: Generalized weakness  Communication   Communication: No difficulties  Cognition Arousal/Alertness: Lethargic;Suspect due to medications Behavior During Therapy: [facility] for tasks assessed/performed Overall Cognitive Status: Impaired/Different from baseline Area of  Impairment: Following commands     Memory: Decreased recall of precautions;Decreased short-term memory Following Commands: Follows one step commands inconsistently;Follows one step commands with increased time            General Comments General comments (skin integrity, edema, etc.): reviewed back precautions, safety with mobility, and positioning. Husband present. Wife concerned husband cannot provide appropriate level of care due to dementia.    Exercises        Assessment/Plan    PT Assessment Patient needs continued PT services  PT Diagnosis Difficulty walking;Abnormality of gait;Acute pain;Generalized weakness   PT Problem List Decreased strength;Decreased activity tolerance;Decreased balance;Decreased mobility;Decreased range of motion;Decreased knowledge of use of DME;Decreased cognition;Decreased safety awareness;Decreased knowledge of precautions;Pain;Obesity  PT Treatment Interventions DME instruction;Gait training;Functional mobility training;Therapeutic activities;Therapeutic exercise;Balance training;Stair training;Neuromuscular re-education;Cognitive remediation;Patient/family education;Modalities   PT Goals (Current goals can be found in the Care Plan section) Acute Rehab PT Goals Patient Stated Goal: none stated PT Goal Formulation: With patient Time For Goal Achievement: 11/22/14 Potential to Achieve Goals: Good    Frequency Min 5X/week   Barriers to discharge Decreased caregiver support concern that husband unable to provide enough support due to dementia    Co-evaluation               End of Session Equipment Utilized During Treatment: Back brace Activity Tolerance: Patient tolerated treatment well Patient left: in chair;with call bell/phone within reach;with chair alarm set;with family/visitor present Nurse Communication: Mobility status         Time: 0940-1006 PT Time Calculation (min) (ACUTE ONLY): 26 min   Charges:   PT  Evaluation $Initial PT Evaluation Tier I: 1 Procedure PT Treatments $Gait Training: 8-22 mins   PT G CodesEllouise Newer 11/08/2014, 10:47 AM Elayne Snare, [place]

## 2014-11-08 NOTE — Progress Notes (Signed)
Upon examination I found the patient to be very lethargic and nonverbal in response.  She could follow a few simple commands like squeezing my hand and wiggling her toes; she was not able to answer questions, keep eyes open; and had also developed an all over body tremor that I am not sure if its related to her baseline.  Called Brooke with [facility] and we conferred to contact Dr. On call. Dr. Sherley Bounds called back and gave the order for a 1/2 dose of Narcan per telephone order.  Contacted pharmacy who told us that .04mg  was the normal dose rate so we gave .02mg  to see if we could get a better response from her.  10:04 I noticed that the tremors have calmed down a lot and the patient was able to moan/mubble some.  Will continue to access the patient.

## 2014-11-08 NOTE — Evaluation (Signed)
Occupational Therapy Evaluation Patient Details Name: Abigail [name] MRN: [id number] DOB: [birth year] Today's Date: 11/08/2014    History of Present Illness 74 y.o. female s/p L4-5 Maximum access posterior lumbar interbody fusion   Clinical Impression   PTA pt lived at home and was independent with ADLS. Pt is currently limited by decreased ROM and back pain and requires assist for ADLs and min A for functional mobility. Pt will benefit from SNF for ST Rehab at d/c. Pt will benefit from acute OT to increase independence with ADLs.     Follow Up Recommendations  SNF;Supervision/Assistance - 24 hour    Equipment Recommendations  Other (comment) (TBD)    Recommendations for Other Services       Precautions / Restrictions Precautions Precautions: Back Precaution Booklet Issued: Yes (comment) Precaution Comments: reviewed Required Braces or Orthoses: Spinal Brace Spinal Brace: Applied in sitting position Restrictions Weight Bearing Restrictions: No      Mobility Bed Mobility               General bed mobility comments: Pt sitting up in recliner. Reviewed log roll technique.   Transfers Overall transfer level: Needs assistance Equipment used: Rolling walker (2 wheeled) Transfers: Sit to/from Stand Sit to Stand: Min assist         General transfer comment: min assist for boost to stand. VC for hand placement. Slow to rise.         ADL Overall ADL's : Needs assistance/impaired Eating/Feeding: Independent;Sitting   Grooming: Set up;Sitting   Upper Body Bathing: Set up;Sitting   Lower Body Bathing: Moderate assistance;Sit to/from stand   Upper Body Dressing : Sitting;Minimal assistance   Lower Body Dressing: Sit to/from stand;Maximal assistance   Toilet Transfer: Minimal assistance;Ambulation;Comfort height toilet;RW           Functional mobility during ADLs: Minimal assistance;Rolling walker       Vision Vision Assessment?: No apparent  visual deficits   Perception     Praxis      Pertinent Vitals/Pain Pain Assessment: 0-10 Pain Score: 7  Pain Location: back Pain Descriptors / Indicators: Aching Pain Intervention(s): Monitored during session;Repositioned     Hand Dominance Right   Extremity/Trunk Assessment Upper Extremity Assessment Upper Extremity Assessment: Overall WFL for tasks assessed   Lower Extremity Assessment Lower Extremity Assessment: Generalized weakness   Cervical / Trunk Assessment Cervical / Trunk Assessment: Normal   Communication Communication Communication: No difficulties   Cognition Arousal/Alertness: Lethargic;Suspect due to medications Behavior During Therapy: [facility] for tasks assessed/performed Overall Cognitive Status: Impaired/Different from baseline Area of Impairment: Following commands     Memory: Decreased recall of precautions;Decreased short-term memory Following Commands: Follows one step commands inconsistently;Follows one step commands with increased time                      Home Living Family/patient expects to be discharged to:: Unsure Living Arrangements: Spouse/significant other Available Help at Discharge: Family;Available 24 hours/day Type of Home: House Home Access: Stairs to enter [employer] of Steps: 4-5 Entrance Stairs-Rails: Left Home Layout: One level               Home Equipment: Walker - 4 wheels;Walker - 2 wheels;Shower seat;Grab bars - tub/shower          Prior Functioning/Environment Level of Independence: Independent             OT Diagnosis: Generalized weakness;Acute pain   OT Problem List: Decreased strength;Decreased range of motion;Decreased activity tolerance;Impaired  balance (sitting and/or standing);Decreased cognition;Decreased safety awareness;Decreased knowledge of use of DME or AE;Decreased knowledge of precautions;Pain   OT Treatment/Interventions: Self-care/ADL training;Therapeutic  exercise;Energy conservation;DME and/or AE instruction;Therapeutic activities;Patient/family education;Balance training    OT Goals(Current goals can be found in the care plan section) Acute Rehab OT Goals Patient Stated Goal: none stated OT Goal Formulation: With patient/family Time For Goal Achievement: 11/22/14 Potential to Achieve Goals: Good ADL Goals Pt Will Perform Grooming: with supervision;standing Pt Will Perform Lower Body Bathing: sit to/from stand;with min assist Pt Will Perform Lower Body Dressing: sit to/from stand;with min assist Pt Will Transfer to Toilet: with supervision;ambulating Pt Will Perform Toileting - Clothing Manipulation and hygiene: with supervision;sit to/from stand;with adaptive equipment  OT Frequency: Min 2X/week   Barriers to D/C: Decreased caregiver support             End of Session Equipment Utilized During Treatment: Gait belt;Rolling walker;Back brace  Activity Tolerance: Patient limited by lethargy Patient left: in chair;with call bell/phone within reach;with chair alarm set;with family/visitor present   Time: 1126-1150 OT Time Calculation (min): 24 min Charges:  OT General Charges $OT Visit: 1 Procedure OT Evaluation $Initial OT Evaluation Tier I: 1 Procedure OT Treatments $Self Care/Home Management : 8-22 mins G-Codes:    Villa Herb M 12-01-2014, 1:39 PM   Cyndie Chime, OTR/L Occupational Therapist [phone] (pager)

## 2014-11-09 LAB — GLUCOSE, CAPILLARY
Glucose-Capillary: 181 mg/dL — ABNORMAL HIGH (ref 65–99)
Glucose-Capillary: 264 mg/dL — ABNORMAL HIGH (ref 65–99)
Glucose-Capillary: 115 mg/dL — ABNORMAL HIGH (ref 65–99)
Glucose-Capillary: 220 mg/dL — ABNORMAL HIGH (ref 65–99)

## 2014-11-09 LAB — URINALYSIS, ROUTINE W REFLEX MICROSCOPIC
Bilirubin Urine: NEGATIVE
Glucose, UA: NEGATIVE mg/dL
Hgb urine dipstick: NEGATIVE
Ketones, ur: NEGATIVE mg/dL
Leukocytes, UA: NEGATIVE
Nitrite: NEGATIVE
Protein, ur: NEGATIVE mg/dL
Specific Gravity, Urine: 1.013 (ref 1.005–1.030)
Urobilinogen, UA: 0.2 mg/dL (ref 0.0–1.0)
pH: 5.5 (ref 5.0–8.0)

## 2014-11-09 NOTE — Progress Notes (Signed)
Several family members (spouse, son and daughter-in-law) over the past few hours have repeatedly requested that patient be medicated for pain. Advised them of patient's low blood pressure and giving additional pain medication at this time will further decrease BP. Heat packs applied to back and hip. Will continue to monitor pain and vital signs closely.

## 2014-11-09 NOTE — Progress Notes (Signed)
Called per floor RN regarding Pt lethargy and mild tremor in upper body. VSS, Pt warm dry, lungs clear. Pt able to follow simple commands but unable to state name or track with eyes. Advised RN to notify surgery of Pt status. Call back received per Dr. Ronnald Ramp and Narcan ordered. RN to give Narcan, RRT will follow tonight.

## 2014-11-09 NOTE — Progress Notes (Signed)
Patient is out of bed to chair. Complains of back and leg pain. No numbness tingling or weakness. Good dorsi and plantar flexion. Continue pain management.

## 2014-11-10 ENCOUNTER — Encounter (HOSPITAL_COMMUNITY): Payer: Self-pay | Admitting: Neurosurgery

## 2014-11-10 DIAGNOSIS — L8990 Pressure ulcer of unspecified site, unspecified stage: Secondary | ICD-10-CM | POA: Insufficient documentation

## 2014-11-10 LAB — GLUCOSE, CAPILLARY
Glucose-Capillary: 182 mg/dL — ABNORMAL HIGH (ref 65–99)
Glucose-Capillary: 199 mg/dL — ABNORMAL HIGH (ref 65–99)
Glucose-Capillary: 151 mg/dL — ABNORMAL HIGH (ref 65–99)
Glucose-Capillary: 158 mg/dL — ABNORMAL HIGH (ref 65–99)

## 2014-11-10 MED ORDER — SODIUM CHLORIDE 0.9 % IV BOLUS (SEPSIS)
500.0000 mL | Freq: Once | INTRAVENOUS | Status: AC
  Administered 2014-11-10: 500 mL via INTRAVENOUS

## 2014-11-10 MED ORDER — BISACODYL 10 MG RE SUPP: 10.0000 mg | Freq: Every day | RECTAL | Status: DC | PRN

## 2014-11-10 MED ORDER — POLYETHYLENE GLYCOL 3350 17 G PO PACK
17.0000 g | PACK | Freq: Every day | ORAL | Status: DC
Start: 2014-11-10 — End: 2014-11-12
  Administered 2014-11-10 – 2014-11-12 (×3): 17 g via ORAL
  Filled 2014-11-10 (×3): qty 1

## 2014-11-10 NOTE — Progress Notes (Signed)
Patient's BP soft throughout stay, MD aware, 500cc normal saline bolus ordered. BP 150/72, MD aware prior to administration of bolus. MD aware of patient's confusion after percocet administration.

## 2014-11-10 NOTE — Progress Notes (Signed)
Physical Therapy Treatment Patient Details Name: Abigail [name] MRN: [id number] DOB: 13-Jul-1940 Today's Date: 11/10/2014    History of Present Illness 74 y.o. female s/p L4-5 Maximum access posterior lumbar interbody fusion    PT Comments    Pt required min assist to perform log roll and assist to don back brace sitting EOB.  Pt tends to drift to the R during ambulation and occasionally requires min assist to navigate around chair, cabinet, door frame.  Pt will benefit from continued skilled PT services to increase functional independence and safety.   Follow Up Recommendations  SNF;Supervision/Assistance - 24 hour     Equipment Recommendations  None recommended by PT    Recommendations for Other Services       Precautions / Restrictions Precautions Precautions: Back Precaution Comments: Pt did not recall back precautions and required reminder of no bending, arching, twisting Required Braces or Orthoses: Spinal Brace Spinal Brace: Applied in sitting position Restrictions Weight Bearing Restrictions: No    Mobility  Bed Mobility Overal bed mobility: Needs Assistance Bed Mobility: Rolling;Sidelying to Sit Rolling: Min assist Sidelying to sit: Min assist       General bed mobility comments: Min assist w/ managing BLEs and sitting upright during log roll.  Pt required max VCs to perform correctly.  Pt impulsive and requires cues for each step of the log roll.  Transfers Overall transfer level: Needs assistance Equipment used: Rolling walker (2 wheeled) Transfers: Sit to/from Stand Sit to Stand: Min assist         General transfer comment: Min assist to power up to standing.  Pt w/ good recall of hand placement.  Increased time.  Ambulation/Gait Ambulation/Gait assistance: Min guard Ambulation Distance (Feet): 40 Feet Assistive device: Rolling walker (2 wheeled) Gait Pattern/deviations: Step-through pattern;Antalgic;Trunk flexed;Drifts right/left Gait velocity:  decreased Gait velocity interpretation: Below normal speed for age/gender General Gait Details: Pt continues to bump into chair, counter, and door frame w/ RW and requires VCs and min assist for redirecting.   Stairs            Wheelchair Mobility    Modified Rankin (Stroke Patients Only)       Balance Overall balance assessment: Needs assistance Sitting-balance support: Bilateral upper extremity supported;Feet supported Sitting balance-Leahy Scale: Fair Sitting balance - Comments: Pt w/ R lateral lean (likely to uneven bed) when donning brace w/o either BUE supported Postural control: Right lateral lean Standing balance support: Bilateral upper extremity supported;During functional activity Standing balance-Leahy Scale: Poor                      Cognition Arousal/Alertness: Lethargic;Suspect due to medications Behavior During Therapy: [facility] for tasks assessed/performed Overall Cognitive Status: Impaired/Different from baseline Area of Impairment: Following commands     Memory: Decreased recall of precautions;Decreased short-term memory Following Commands: Follows one step commands consistently            Exercises General Exercises - Lower Extremity Ankle Circles/Pumps: AROM;Both;10 reps;Supine Heel Slides: AROM;Both;Supine;10 reps    General Comments        Pertinent Vitals/Pain Pain Assessment: 0-10 Pain Score: 6  Pain Location: back Pain Descriptors / Indicators: Aching Pain Intervention(s): Limited activity within patient's tolerance;Monitored during session;Repositioned    Home Living                      Prior Function            PT Goals (current goals can now  be found in the care plan section) Acute Rehab PT Goals Patient Stated Goal: to get better PT Goal Formulation: With patient Time For Goal Achievement: 11/22/14 Potential to Achieve Goals: Good Progress towards PT goals: Progressing toward goals    Frequency  Min  5X/week    PT Plan Current plan remains appropriate    Co-evaluation             End of Session Equipment Utilized During Treatment: Back brace Activity Tolerance: Patient tolerated treatment well Patient left: in chair;with call bell/phone within reach;with chair alarm set     Time: 9458-5929 PT Time Calculation (min) (ACUTE ONLY): 29 min  Charges:  $Gait Training: 8-22 mins $Therapeutic Exercise: 8-22 mins                    G Codes:      Joslyn Hy PT, DPT [phone] Pager: [phone] 11/10/2014, 12:20 PM

## 2014-11-10 NOTE — Progress Notes (Signed)
Subjective: Patient reports "I just hurt when I move...in my low back. I haven't been up"  Objective: Vital signs in last 24 hours: Temp:  [99 F (37.2 C)-100.4 F (38 C)] 100.1 F (37.8 C) (06/13 0549) Pulse Rate:  [57-82] 66 (06/13 0549) Resp:  [18-20] 18 (06/13 0549) BP: (96-129)/(32-47) 114/47 mmHg (06/13 0549) SpO2:  [91 %-98 %] 94 % (06/13 0549)  Intake/Output from previous day: 06/12 0701 - 06/13 0700 In: -  Out: 450 [Urine:450] Intake/Output this shift:    Conversant, does not recall difficulties of weekend & does not recall walking to chair with PT. Complains of being "chained to the bed" with SCD's. Strength is good BLE. Incision is flat without erythema or drainage beneath honeycomb drsg.  Report of urinary incontinence; pt notes delay in receiving bedpan results in accidents.   Lab Results: No results for input(s): WBC, HGB, HCT, PLT in the last 72 hours. BMET No results for input(s): NA, K, CL, CO2, GLUCOSE, BUN, CREATININE, CALCIUM in the last 72 hours.  Studies/Results: No results found.  Assessment/Plan: Improved pain levels   LOS: 3 days  Per Dr. Vertell Limber, I&O cath to check residual with reported incontinence; mobilize with PT. [facility] if PT deems safe with standing/transferring.   PoteatAaron Edelman 11/10/2014, 8:16 AM    Confused this morning, but not in pain.  Mobilize with PT.

## 2014-11-10 NOTE — Progress Notes (Signed)
Patient stated she has been itching back of neck since surgery. Skin is red and three small abraisons noted, slightly raised. Area cleansed, foam dressing applied to back, patient instructed to stop scratching area as it is causing irritation. Lotion applied to rest of back.

## 2014-11-10 NOTE — Progress Notes (Signed)
Patient's son spoke with RN, stated he was concerned, stated patient seemed to be more confused today. RN contacted on call MD, no new orders. Will continue to monitor patient.

## 2014-11-11 LAB — GLUCOSE, CAPILLARY
Glucose-Capillary: 153 mg/dL — ABNORMAL HIGH (ref 65–99)
Glucose-Capillary: 218 mg/dL — ABNORMAL HIGH (ref 65–99)
Glucose-Capillary: 130 mg/dL — ABNORMAL HIGH (ref 65–99)
Glucose-Capillary: 79 mg/dL (ref 65–99)

## 2014-11-11 MED ORDER — MOMETASONE FUROATE 0.1 % EX CREA
TOPICAL_CREAM | CUTANEOUS | Status: DC | PRN
  Administered 2014-11-11: 23:00:00 via TOPICAL
  Filled 2014-11-11: qty 15

## 2014-11-11 NOTE — Clinical Social Work Note (Signed)
Clinical Social Work Assessment  Patient Details  Name: Abigail [name] MRN: [id number] Date of Birth: [birth year]  Date of referral:  11/11/14               Reason for consult:  Facility Placement, Discharge Planning                Permission sought to share information with:  Case Manager, Facility Sport and exercise psychologist, PCP, Family Supports Permission granted to share information::  Yes, Verbal Permission Granted  Name::      Durward Fortes )  Agency::   (SNF's )  Relationship::   (Spouse )  Contact Information:   [phone])  Housing/Transportation Living arrangements for the past 2 months:  [place] of Information:  Patient, Spouse Patient Interpreter Needed:  None Criminal Activity/Legal Involvement Pertinent to Current Situation/Hospitalization:  No - Comment as needed Significant Relationships:  Adult Children, Spouse Lives with:  Spouse Do you feel safe going back to the place where you live?  No Need for family participation in patient care:  No (Coment)  Care giving concerns: Patient concerned with returning home once discharge and aware that her husband will not be able to care for her. Patient agreeable to SNF placement.    Social Worker assessment / plan:  Holiday representative met with patient and patient's husband in reference to post-acute placement for SNF. CSW introduced CSW role and SNF process. CSW also reviewed and provided SNF list at bedside. Patient recognizes her husband will not be able to care for her at home alone and is agreeable to SNF placement. Patient has already pre-registered at [facility] and [place] and would prefer to transition to [facility] once stable for discharge. Pt's husband agreeable with plan for [facility] as well. Patient reported other than pain she is feeling well. Pt's husband and family involved and supportive in pt's care. No further concerns reported at this time. CSW will continue to follow pt and pt's  family for continued support and to facilitate pt's discharge needs once medically stable.   Employment status:  Retired Forensic scientist:  Medicare PT Recommendations:  [place] / Referral to community resources:  [place]  Patient/Family's Response to care:  Pt alert and oriented x4. Pt and husband agreeable to SNF placement at [facility]. Pt realistic and accepting of discharge planning. Pt and pt's husband pleasant and appreciated social work intervention.   Patient/Family's Understanding of and Emotional Response to Diagnosis, Current Treatment, and Prognosis:  Pt and pt's family understanding of surgical procedure and current treatment. Pt and family also understanding that pt requires a higher level of care within a short-term rehab facility. CSW remains available.   Emotional Assessment Appearance:  Appears stated age Attitude/Demeanor/Rapport:   (Welcoming and Pleasant ) Affect (typically observed):  Accepting, Calm, Appropriate, Pleasant Orientation:  Oriented to Self, Oriented to Place, Oriented to  Time, Oriented to Situation Alcohol / Substance use:  Never Used Psych involvement (Current and /or in the community):  No (Comment)  Discharge Needs  Concerns to be addressed:  No discharge needs identified Readmission within the last 30 days:  No Current discharge risk:  None Barriers to Discharge:  No Barriers Identified   Glendon Axe, MSW, LCSWA [phone] 11/11/2014 10:49 AM

## 2014-11-11 NOTE — Clinical Social Work Placement (Signed)
   CLINICAL SOCIAL WORK PLACEMENT  NOTE  Date:  11/11/2014  Patient Details  Name: Abigail [name] MRN: [id number] Date of Birth: 1941-01-13  Clinical Social Work is seeking post-discharge placement for this patient at the [place] level of care (*CSW will initial, date and re-position this form in  chart as items are completed):  Yes   Patient/family provided with [place] Work Department's list of facilities offering this level of care within the geographic area requested by the patient (or if unable, by the patient's family).  Yes   Patient/family informed of their freedom to choose among providers that offer the needed level of care, that participate in Medicare, Medicaid or managed care program needed by the patient, have an available bed and are willing to accept the patient.  Yes   Patient/family informed of [facility]'s ownership interest in [facility] and [facility], as well as of the fact that they are under no obligation to receive care at these facilities.  PASRR submitted to EDS on 11/11/14     PASRR number received on 11/11/14     Existing PASRR number confirmed on       FL2 transmitted to all facilities in geographic area requested by pt/family on 11/11/14     FL2 transmitted to all facilities within larger geographic area on       Patient informed that his/her managed care company has contracts with or will negotiate with certain facilities, including the following:   (YES)     Yes   Patient/family informed of bed offers received.  Patient chooses bed at  [facility] and [place] )     Physician recommends and patient chooses bed at      Patient to be transferred to  [facility] and [place] ) on  .  Patient to be transferred to facility by       Patient family notified on   of transfer.  Name of family member notified:        PHYSICIAN Please sign FL2     Additional Comment:     _______________________________________________ Rozell Searing, LCSW 11/11/2014, 10:49 AM

## 2014-11-11 NOTE — Progress Notes (Signed)
Physical Therapy Treatment Patient Details Name: Abigail [name] MRN: [id number] DOB: 05-08-1941 Today's Date: 11/11/2014    History of Present Illness 74 y.o. female s/p L4-5 Maximum access posterior lumbar interbody fusion    PT Comments    Pt progressing well towards all goals but remains to have impaired cognition limiting independence with mobility. Con't to recommend SNF upon d/c to allow pt to achieve safe mod I level of function.  Follow Up Recommendations  SNF;Supervision/Assistance - 24 hour     Equipment Recommendations  None recommended by PT    Recommendations for Other Services       Precautions / Restrictions Precautions Precautions: Back Precaution Booklet Issued: Yes (comment) Precaution Comments: no recall of precautions, pt and family re-ducated and provided with another hand out Required Braces or Orthoses: Spinal Brace Spinal Brace: Applied in sitting position (re-adjusted brace to fit optimally and went over with spouse) Restrictions Weight Bearing Restrictions: No    Mobility  Bed Mobility               General bed mobility comments: pt up in chair upon PT arrival  Transfers Overall transfer level: Needs assistance Equipment used: Rolling walker (2 wheeled) Transfers: Sit to/from Stand Sit to Stand: Min assist         General transfer comment: v/c's for safe hand placement, minA to initiate clearance of bottom from chair  Ambulation/Gait Ambulation/Gait assistance: Min guard Ambulation Distance (Feet): 120 Feet Assistive device: Rolling walker (2 wheeled) Gait Pattern/deviations: Step-through pattern;Decreased stride length Gait velocity: decreased Gait velocity interpretation: Below normal speed for age/gender General Gait Details: v/c's for safe use of RW, stay in walker especially during turning   Stairs            Wheelchair Mobility    Modified Rankin (Stroke Patients Only)       Balance                                     Cognition Arousal/Alertness: Awake/alert Behavior During Therapy: WFL for tasks assessed/performed Overall Cognitive Status: Impaired/Different from baseline Area of Impairment: Safety/judgement;Awareness;Problem solving;Memory     Memory: Decreased recall of precautions;Decreased short-term memory Following Commands: Follows one step commands with increased time Safety/Judgement: Decreased awareness of safety;Decreased awareness of deficits   Problem Solving: Slow processing General Comments: pt with extremely poor recall    Exercises      General Comments General comments (skin integrity, edema, etc.): reviewed in depth back precautions      Pertinent Vitals/Pain Pain Assessment: 0-10 Pain Score: 5  Pain Location: back  Pain Descriptors / Indicators: Aching Pain Intervention(s): Limited activity within patient's tolerance    Home Living                      Prior Function            PT Goals (current goals can now be found in the care plan section) Progress towards PT goals: Progressing toward goals    Frequency  Min 5X/week    PT Plan Current plan remains appropriate    Co-evaluation             End of Session Equipment Utilized During Treatment: Back brace;Gait belt Activity Tolerance: Patient tolerated treatment well Patient left: in chair;with call bell/phone within reach;with chair alarm set;with family/visitor present     Time: 1030-1050 PT Time Calculation (min) (  ACUTE ONLY): 20 min  Charges:  $Gait Training: 8-22 mins                    G Codes:      Kingsley Callander 11/11/2014, 12:22 PM  Kittie Plater, PT, DPT Pager #: [phone] Office #: [phone]

## 2014-11-11 NOTE — Progress Notes (Signed)
Occupational Therapy Treatment Patient Details Name: Abigail [name] MRN: [id number] DOB: 1940-12-22 Today's Date: 11/11/2014    History of present illness 74 y.o. female s/p L4-5 Maximum access posterior lumbar interbody fusion   OT comments  Patient making slow progress towards goals, continue plan of care for now. Patient with poor carryover and memory of precautions. Educated patient on techniques/strategies during ADL to adhere to back precautions.    Follow Up Recommendations  SNF;Supervision/Assistance - 24 hour    Equipment Recommendations  Other (comment) (TBD)    Recommendations for Other Services  None at this time   Precautions / Restrictions Precautions Precautions: Back Precaution Booklet Issued: Yes (comment) Precaution Comments: no recall of precautions, pt and family re-ducated Required Braces or Orthoses: Spinal Brace Spinal Brace: Applied in sitting position Restrictions Weight Bearing Restrictions: No    Mobility Bed Mobility Overal bed mobility: Needs Assistance Bed Mobility: Rolling;Sidelying to Sit Rolling: Supervision General bed mobility comments: HOB elevated and heavy use of bed rails, cues for back precautions   Transfers Overall transfer level: Needs assistance Equipment used: Rolling walker (2 wheeled) Transfers: Sit to/from Stand Sit to Stand: Min assist General transfer comment: v/c's for safe hand placement, minA to power up    Balance Overall balance assessment: Needs assistance Sitting-balance support: No upper extremity supported;Feet supported Sitting balance-Leahy Scale: Good     Standing balance support: Bilateral upper extremity supported;During functional activity Standing balance-Leahy Scale: Fair    ADL Overall ADL's : Needs assistance/impaired   General ADL Comments: Patient educated on back precautions and importance of adhering to precautions. Patient engaged in bed mobility and sat EOB to don lumbar corset. Patient  then ambulated into BR for toilet transfer, then grooming tasks standing at sink. Pt required verbal cues and education on technique for brushing teeth and washing face without breaking back precautions. Patient then ambulated to recliner.      Cognition   Behavior During Therapy: WFL for tasks assessed/performed Overall Cognitive Status: Impaired/Different from baseline Area of Impairment: Safety/judgement;Awareness;Problem solving;Memory     Memory: Decreased recall of precautions;Decreased short-term memory  Following Commands: Follows one step commands with increased time Safety/Judgement: Decreased awareness of safety;Decreased awareness of deficits   Problem Solving: Slow processing General Comments: pt with extremely poor recall                 Pertinent Vitals/ Pain       Pain Assessment: 0-10 Pain Score: 8  Pain Location: back Pain Descriptors / Indicators: Aching Pain Intervention(s): Monitored during session;Repositioned         Frequency Min 2X/week     Progress Toward Goals  OT Goals(current goals can now be found in the care plan section)  Progress towards OT goals: Progressing toward goals     Plan Discharge plan remains appropriate    End of Session Equipment Utilized During Treatment: Rolling walker;Back brace   Activity Tolerance Patient tolerated treatment well   Patient Left in chair;with call bell/phone within reach;with chair alarm set;with family/visitor present  Nurse Communication Other (comment) (rash noted on back)        Time: 5035-4656 OT Time Calculation (min): 31 min  Charges: OT General Charges $OT Visit: 1 Procedure OT Treatments $Self Care/Home Management : 23-37 mins  Mikiah Demond , MS, OTR/L, CLT Pager: [phone]  11/11/2014, 2:19 PM

## 2014-11-11 NOTE — Progress Notes (Signed)
Patient's family insisting patient had a fall. No staff members in room. Patient, family and son stating patient did not touch ground, patient was sitting on side of bed and had to grab onto [facility] to keep from falling. Per definition of fall, patient did not have a fall. Charge nurse, Olivia Mackie, aware and in room with RN.  Bed alarm was placed back on.

## 2014-11-11 NOTE — Progress Notes (Signed)
Subjective: Patient reports "I have some pain in my back and left leg...that's new"  Objective: Vital signs in last 24 hours: Temp:  [98 F (36.7 C)-100.2 F (37.9 C)] 99.3 F (37.4 C) (06/14 0626) Pulse Rate:  [54-68] 62 (06/14 0626) Resp:  [18] 18 (06/14 0626) BP: (104-150)/(37-74) 124/49 mmHg (06/14 0626) SpO2:  [92 %-97 %] 95 % (06/14 0626)  Intake/Output from previous day:   Intake/Output this shift:    Alert & conversant this morning without yesterday's confusion. Pt's son present, concerned about a fall while transferring yesterday. Clarification by nursing reveals no fall, but pt had to grasp BSC to keep from falling. Stress & urge incontinence persists. I&O cath yesterday revealed only 18ml retained. Nurse phone number posted for quicker response, and pt /son aware. Pt encouraged to continue to use call bell if unable to dial.  Incision remains flat, without erythema, swelling, or drainage. Strength is good BLE, but transfers remain difficult & endurance remains low. BP's improved this morning systolic in 552'C.  Lumbar pain as expected & LLE thigh pain not concerning - will monitor.  Lab Results: No results for input(s): WBC, HGB, HCT, PLT in the last 72 hours. BMET No results for input(s): NA, K, CL, CO2, GLUCOSE, BUN, CREATININE, CALCIUM in the last 72 hours.  Studies/Results: No results found.  Assessment/Plan: Improving   LOS: 4 days  Continue to mobilize in LSO with PT. Plan for SNF. Encouraged pt to call at first thought of need to void & not attempt OOB alone. Per DrStern, d/c Percocet. Continue Hydrocodone prn.   Verdis Prime 11/11/2014, 7:56 AM

## 2014-11-12 DIAGNOSIS — Z794 Long term (current) use of insulin: Secondary | ICD-10-CM | POA: Diagnosis not present

## 2014-11-12 DIAGNOSIS — F322 Major depressive disorder, single episode, severe without psychotic features: Secondary | ICD-10-CM | POA: Diagnosis not present

## 2014-11-12 DIAGNOSIS — M4326 Fusion of spine, lumbar region: Secondary | ICD-10-CM | POA: Diagnosis not present

## 2014-11-12 DIAGNOSIS — M6281 Muscle weakness (generalized): Secondary | ICD-10-CM | POA: Diagnosis not present

## 2014-11-12 DIAGNOSIS — E039 Hypothyroidism, unspecified: Secondary | ICD-10-CM | POA: Diagnosis not present

## 2014-11-12 DIAGNOSIS — G4700 Insomnia, unspecified: Secondary | ICD-10-CM | POA: Diagnosis not present

## 2014-11-12 DIAGNOSIS — R262 Difficulty in walking, not elsewhere classified: Secondary | ICD-10-CM | POA: Diagnosis not present

## 2014-11-12 DIAGNOSIS — E1140 Type 2 diabetes mellitus with diabetic neuropathy, unspecified: Secondary | ICD-10-CM | POA: Diagnosis not present

## 2014-11-12 DIAGNOSIS — F329 Major depressive disorder, single episode, unspecified: Secondary | ICD-10-CM | POA: Diagnosis not present

## 2014-11-12 DIAGNOSIS — M545 Low back pain: Secondary | ICD-10-CM | POA: Diagnosis not present

## 2014-11-12 DIAGNOSIS — M4806 Spinal stenosis, lumbar region: Secondary | ICD-10-CM | POA: Diagnosis not present

## 2014-11-12 DIAGNOSIS — E1142 Type 2 diabetes mellitus with diabetic polyneuropathy: Secondary | ICD-10-CM | POA: Diagnosis not present

## 2014-11-12 DIAGNOSIS — M4316 Spondylolisthesis, lumbar region: Secondary | ICD-10-CM | POA: Diagnosis not present

## 2014-11-12 DIAGNOSIS — E118 Type 2 diabetes mellitus with unspecified complications: Secondary | ICD-10-CM | POA: Diagnosis not present

## 2014-11-12 DIAGNOSIS — R41841 Cognitive communication deficit: Secondary | ICD-10-CM | POA: Diagnosis not present

## 2014-11-12 DIAGNOSIS — R278 Other lack of coordination: Secondary | ICD-10-CM | POA: Diagnosis not present

## 2014-11-12 DIAGNOSIS — N3281 Overactive bladder: Secondary | ICD-10-CM | POA: Diagnosis not present

## 2014-11-12 DIAGNOSIS — L8990 Pressure ulcer of unspecified site, unspecified stage: Secondary | ICD-10-CM | POA: Diagnosis not present

## 2014-11-12 DIAGNOSIS — G629 Polyneuropathy, unspecified: Secondary | ICD-10-CM | POA: Diagnosis not present

## 2014-11-12 DIAGNOSIS — R350 Frequency of micturition: Secondary | ICD-10-CM | POA: Diagnosis not present

## 2014-11-12 DIAGNOSIS — N390 Urinary tract infection, site not specified: Secondary | ICD-10-CM | POA: Diagnosis not present

## 2014-11-12 DIAGNOSIS — Z981 Arthrodesis status: Secondary | ICD-10-CM | POA: Diagnosis not present

## 2014-11-12 DIAGNOSIS — K5900 Constipation, unspecified: Secondary | ICD-10-CM | POA: Diagnosis not present

## 2014-11-12 DIAGNOSIS — I10 Essential (primary) hypertension: Secondary | ICD-10-CM | POA: Diagnosis not present

## 2014-11-12 DIAGNOSIS — M792 Neuralgia and neuritis, unspecified: Secondary | ICD-10-CM | POA: Diagnosis not present

## 2014-11-12 DIAGNOSIS — N3080 Other cystitis without hematuria: Secondary | ICD-10-CM | POA: Diagnosis not present

## 2014-11-12 DIAGNOSIS — E785 Hyperlipidemia, unspecified: Secondary | ICD-10-CM | POA: Diagnosis not present

## 2014-11-12 LAB — URINALYSIS, ROUTINE W REFLEX MICROSCOPIC
Bilirubin Urine: NEGATIVE
Glucose, UA: NEGATIVE mg/dL
Ketones, ur: NEGATIVE mg/dL
Nitrite: NEGATIVE
Protein, ur: NEGATIVE mg/dL
Specific Gravity, Urine: 1.006 (ref 1.005–1.030)
Urobilinogen, UA: 1 mg/dL (ref 0.0–1.0)
pH: 6 (ref 5.0–8.0)

## 2014-11-12 LAB — URINE MICROSCOPIC-ADD ON

## 2014-11-12 LAB — GLUCOSE, CAPILLARY
Glucose-Capillary: 158 mg/dL — ABNORMAL HIGH (ref 65–99)
Glucose-Capillary: 203 mg/dL — ABNORMAL HIGH (ref 65–99)

## 2014-11-12 NOTE — Progress Notes (Addendum)
Pt has had 5 incontinent episodes and has gotten up an additional 2 times to go to the bathroom within a 3 hour timeframe. Urine is cloudy. Bladder scan  82 ml post void. UA sent down to lab for possible UTI.

## 2014-11-12 NOTE — Discharge Summary (Signed)
Physician Discharge Summary  Patient ID: Abigail [name] MRN: [id number] DOB/AGE: [birth year] 74 y.o.  Admit date: 11/07/2014 Discharge date: 11/12/2014  Admission Diagnoses: Spondylolisthesis, Lumbar stenosis, spondylosis, herniated lumbar disc, lumbar radiculopathy L 45 level   Discharge Diagnoses: Spondylolisthesis, Lumbar stenosis, spondylosis, herniated lumbar disc, lumbar radiculopathy L 45 level s/p Lumbar Four-Five Maximum access posterior lumbar interbody fusion (N/A) - L4-5 Maximum access posterior lumbar interbody fusion with PEEK cages, autograft, pedicle screw fixation, posterolateral arthrodesis  Active Problems:   Spondylolisthesis of lumbar region   Pressure ulcer   Discharged Condition: good  Hospital Course: Bruce Mayers was admitted for surgery with dx stenosis, spondylolisthesis, and radiculopathy. Following uncomplicated decompression and fusion, she recovered well and transferred to [facility] for nursing care & therapies. Progress slowed by episodes of hypotension and confusion, but she has progressed.    Consults: None  Significant Diagnostic Studies: radiology: X-Ray: intra-operative  Treatments: surgery: Lumbar Four-Five Maximum access posterior lumbar interbody fusion (N/A) - L4-5 Maximum access posterior lumbar interbody fusion with PEEK cages, autograft, pedicle screw fixation, posterolateral arthrodesis   Discharge Exam: Blood pressure 130/44, pulse 55, temperature 98.1 F (36.7 C), temperature source Oral, resp. rate 20, height 5\' 3"  (1.6 m), weight 97.977 kg (216 lb), SpO2 97 %. Alert, conversant. Husband at bedside. No confusion exhibited today. Diastolic pressures remain low - holding bp meds. Pt asymptomatic.  Lumbar pain controlled by po meds. No leg pain. Good strength BLE, but continues to require assistance with transfers  and ambulation d/t generalized weakness and low endurance. Incision without erythema, swelling, or drainage beneath honeycomb drsg.  Heat rash upper buttocks below drsg site secondary to chux. Encouraged pt to change positions frequently. Pt & husband agreeable to plan for SNF this afternoon and office f/u with DrStern in 3-4 weeks.    Disposition: Discharge to SNF. Pt agrees to call office to schedule f/u appt. She verbalizes understanding of d/c instructions.     Medication List    ASK your doctor about these medications        amLODipine 10 MG tablet  Commonly known as:  NORVASC  Take 5 mg by mouth every morning. Take 1/2 tablet daily     aspirin EC 81 MG tablet  Take 81 mg by mouth every evening.     AZO CRANBERRY 250-30 MG Tabs  Take 1 tablet by mouth at bedtime.     B-complex with vitamin C tablet  Take 1 tablet by mouth every evening.     carvedilol 6.25 MG tablet  Commonly known as:  COREG  Take 6.25 mg by mouth 2 (two) times daily.     ESTRACE VAGINAL 0.1 MG/GM vaginal cream  Generic drug:  estradiol  Place 1 Applicatorful vaginally 2 (two) times a week. Apply at bedtime on Monday and Friday.     fluticasone 50 MCG/ACT nasal spray  Commonly known as:  FLONASE  Place 1 spray into the nose daily as needed for allergies.     gabapentin 400 MG capsule  Commonly known as:  NEURONTIN  Take 1 capsule (400 mg total) by mouth 3 (three) times daily.     Garlic 7017 MG Caps  Take 1,000 mg by mouth daily.     Ginkgo Biloba Extract 60 MG Caps  Take 1 capsule by mouth daily.     Insulin Pen Needle 31G X 5 MM Misc     LEVEMIR FLEXTOUCH 100 UNIT/ML Pen  Generic drug:  Insulin Detemir  Inject 50 Units into the  skin every morning.     levothyroxine 150 MCG tablet  Commonly known as:  SYNTHROID, LEVOTHROID  Take 150 mcg by mouth daily.     losartan-hydrochlorothiazide 100-25 MG per tablet  Commonly known as:  HYZAAR  Take 1 tablet by mouth daily.     Melatonin 10 MG Tabs  Take 1 tablet by mouth at bedtime.     multivitamin tablet  Take 1 tablet by mouth every evening.     mupirocin ointment  2 %  Commonly known as:  BACTROBAN  Apply 1 application topically daily as needed (use for cuts, etc).     oxybutynin 10 MG 24 hr tablet  Commonly known as:  DITROPAN-XL  Take 10 mg by mouth at bedtime.     pravastatin 80 MG tablet  Commonly known as:  PRAVACHOL  Take 80 mg by mouth every morning.     PROBIOTIC DAILY PO  Take 1 capsule by mouth daily.     sertraline 100 MG tablet  Commonly known as:  ZOLOFT  Take 150 mg by mouth every morning.     THERA-M Tabs  Take 1 tablet by mouth daily.     traMADol 50 MG tablet  Commonly known as:  ULTRAM  Take 50 mg by mouth every 6 (six) hours as needed.     trimethoprim 100 MG tablet  Commonly known as:  TRIMPEX  Take 100 mg by mouth at bedtime.     VITAMIN B-12 IJ  Inject 1,000 mg as directed every 28 (twenty-eight) days.     Vitamin D (Ergocalciferol) 50000 UNITS Caps capsule  Commonly known as:  DRISDOL  Take 50,000 Units by mouth every 30 (thirty) days. In the middle of the month         Signed: Verdis Prime 11/12/2014, 1:48 PM

## 2014-11-12 NOTE — Progress Notes (Signed)
Pt ambulated with rolling walker with brace on and aligned to sit up in chair to eat breakfast. Gait steady, standby assist. No noted distress. Pt denied pain or discomfort. Call bell within reach.

## 2014-11-12 NOTE — Progress Notes (Signed)
Subjective: Patient reports less confused  Objective: Vital signs in last 24 hours: Temp:  [98.1 F (36.7 C)-98.8 F (37.1 C)] 98.1 F (36.7 C) (06/15 0559) Pulse Rate:  [52-82] 58 (06/15 0559) Resp:  [17-20] 18 (06/15 0559) BP: (107-132)/(38-49) 117/38 mmHg (06/15 0559) SpO2:  [95 %-99 %] 99 % (06/15 0559)  Intake/Output from previous day: 06/14 0701 - 06/15 0700 In: -  Out: 75 [Urine:75] Intake/Output this shift:    Physical Exam: Up in brace in chair.  Strength full  Lab Results: No results for input(s): WBC, HGB, HCT, PLT in the last 72 hours. BMET No results for input(s): NA, K, CL, CO2, GLUCOSE, BUN, CREATININE, CALCIUM in the last 72 hours.  Studies/Results: No results found.  Assessment/Plan: Still with diastolic hypotension.  Hold diuretic and Norvasc.  Improved confusional state.  Mobilize with PT.  Transfer to SNF if doing well later today.    LOS: 5 days    Peggyann Shoals, MD 11/12/2014, 8:50 AM

## 2014-11-12 NOTE — Clinical Social Work Placement (Signed)
   CLINICAL SOCIAL WORK PLACEMENT  NOTE  Date:  11/12/2014  Patient Details  Name: Abigail [name] MRN: [id number] Date of Birth: 1940-09-12  Clinical Social Work is seeking post-discharge placement for this patient at the [place] level of care (*CSW will initial, date and re-position this form in  chart as items are completed):  Yes   Patient/family provided with [place] Work Department's list of facilities offering this level of care within the geographic area requested by the patient (or if unable, by the patient's family).  Yes   Patient/family informed of their freedom to choose among providers that offer the needed level of care, that participate in Medicare, Medicaid or managed care program needed by the patient, have an available bed and are willing to accept the patient.  Yes   Patient/family informed of [facility]'s ownership interest in [facility] and [facility], as well as of the fact that they are under no obligation to receive care at these facilities.  PASRR submitted to EDS on 11/11/14     PASRR number received on 11/11/14     Existing PASRR number confirmed on       FL2 transmitted to all facilities in geographic area requested by pt/family on 11/11/14     FL2 transmitted to all facilities within larger geographic area on       Patient informed that his/her managed care company has contracts with or will negotiate with certain facilities, including the following:   (YES)     Yes   Patient/family informed of bed offers received.  Patient chooses bed at  [facility] and [facility] )     Physician recommends and patient chooses bed at      Patient to be transferred to  [facility] ) on 11/12/14.  Patient to be transferred to facility by  [facility] )     Patient family notified on 11/12/14 of transfer.  Name of family member notified:   (Pt's husband and son)     PHYSICIAN Please sign FL2      Additional Comment:    _______________________________________________ Rozell Searing, LCSW 11/12/2014, 1:28 PM

## 2014-11-12 NOTE — Clinical Social Work Note (Signed)
Patient has a bed at [facility] once medically stable for discharge. CSW remains available.   FL-2 on chart for MD signature.   Glendon Axe, MSW, LCSWA [phone] 11/12/2014 8:36 AM

## 2014-11-12 NOTE — Progress Notes (Signed)
Patient ID: Abigail [name], female   DOB: May 27, 1941, 74 y.o.   MRN: [id number] Alert, conversant. Husband at bedside. No confusion exhibited today. Diastolic pressures remain low - holding bp meds. Pt asymptomatic.  Lumbar pain controlled by po meds. No leg pain. Good strength BLE, but continues to require assistance with transfers  and ambulation d/t generalized weakness and low endurance. Incision without erythema, swelling, or drainage beneath honeycomb drsg. Heat rash upper buttocks below drsg site secondary to chux. Encouraged pt to change positions frequently. Pt & husband agreeable to plan for SNF this afternoon and office f/u with DrStern in 3-4 weeks.   Verdis Prime RN BSN

## 2014-11-12 NOTE — Clinical Social Work Note (Signed)
Clinical Social Worker facilitated patient discharge including contacting patient family and facility to confirm patient discharge plans.  Clinical information faxed to facility and family agreeable with plan.  CSW arranged ambulance transport via [facility] to [facility].  RN to call report prior to discharge.  DC packet prepared and on chart for transport with number to call report.  Clinical Social Worker will sign off for now as social work intervention is no longer needed. Please consult [place] again if new need arises.  Glendon Axe, MSW, LCSWA [phone] 11/12/2014 1:30 PM

## 2014-11-12 NOTE — Progress Notes (Signed)
Pt discharged at this time  taking all personal belongings. Alert, verbal with no noted distress. Discharge instructions provided with verbal understanding sent to facility. Husband to follow her to [facility]. Attempted x2 to call in report.

## 2014-11-13 ENCOUNTER — Encounter: Payer: Self-pay | Admitting: Adult Health

## 2014-11-13 ENCOUNTER — Non-Acute Institutional Stay (SKILLED_NURSING_FACILITY): Payer: Medicare Other | Admitting: Adult Health

## 2014-11-13 DIAGNOSIS — I10 Essential (primary) hypertension: Secondary | ICD-10-CM

## 2014-11-13 DIAGNOSIS — E039 Hypothyroidism, unspecified: Secondary | ICD-10-CM

## 2014-11-13 DIAGNOSIS — M4316 Spondylolisthesis, lumbar region: Secondary | ICD-10-CM

## 2014-11-13 DIAGNOSIS — L8990 Pressure ulcer of unspecified site, unspecified stage: Secondary | ICD-10-CM

## 2014-11-13 DIAGNOSIS — G4700 Insomnia, unspecified: Secondary | ICD-10-CM | POA: Diagnosis not present

## 2014-11-13 DIAGNOSIS — N390 Urinary tract infection, site not specified: Secondary | ICD-10-CM

## 2014-11-13 DIAGNOSIS — G629 Polyneuropathy, unspecified: Secondary | ICD-10-CM

## 2014-11-13 DIAGNOSIS — E785 Hyperlipidemia, unspecified: Secondary | ICD-10-CM

## 2014-11-13 DIAGNOSIS — K5900 Constipation, unspecified: Secondary | ICD-10-CM

## 2014-11-13 DIAGNOSIS — R350 Frequency of micturition: Secondary | ICD-10-CM | POA: Diagnosis not present

## 2014-11-13 DIAGNOSIS — E1140 Type 2 diabetes mellitus with diabetic neuropathy, unspecified: Secondary | ICD-10-CM

## 2014-11-13 DIAGNOSIS — J309 Allergic rhinitis, unspecified: Secondary | ICD-10-CM

## 2014-11-13 DIAGNOSIS — Z79890 Hormone replacement therapy (postmenopausal): Secondary | ICD-10-CM

## 2014-11-13 DIAGNOSIS — F329 Major depressive disorder, single episode, unspecified: Secondary | ICD-10-CM | POA: Diagnosis not present

## 2014-11-13 DIAGNOSIS — F32A Depression, unspecified: Secondary | ICD-10-CM

## 2014-11-13 NOTE — Progress Notes (Signed)
Patient ID: Abigail [name], female   DOB: 1940-08-22, 74 y.o.   MRN: [id number]   11/13/2014  Facility:  Nursing Home Location:  [place] Room Number: 762-G LEVEL OF CARE:  SNF (31)   Chief Complaint  Patient presents with  . Hospitalization Follow-up    Spondylolisthesis S/P lumbar fusion and decompression, diabetes mellitus, hypertension, HRT, allergic rhinitis, neuropathy, hypothyroidism, insomnia, urinary frequency, hyperlipidemia, frequent UTI, pressure ulcer, depression and constipation    HISTORY OF PRESENT ILLNESS:  This is a 74 year old female who was been admitted to [facility] on 11/12/14 from [facility] She has PMH of hypertension, shingles, CAD, diabetes mellitus, fibromyalgia, CKD, GERD and depression. She had stenosis, spondylolisthesis, and radiculopathy. On 6/10, she had Lumbar Four-Five Maximum access posterior lumbar interbody fusion (N/A) - L4-5 Maximum access posterior lumbar interbody fusion with PEEK cages, autograft, pedicle screw fixation, posterolateral arthrodesis.  She has been admitted for a short-term rehabilitation.  PAST MEDICAL HISTORY:  Past Medical History  Diagnosis Date  . CAD (coronary artery disease)   . Obesity   . Fibromyalgia   . Family hx of colon cancer   . Depression   . Hypertension   . Obesity   . GERD (gastroesophageal reflux disease)     pt denies  . PONV (postoperative nausea and vomiting)     with stapedectomy  . Torn rotator cuff left    current  . Heart murmur   . Diabetes mellitus     Type 2  . Frequency of urination   . Constipation due to pain medication   . Boils     near vaginal area  . Hyperlipidemia   . Itchy eyes   . History of bladder infections   . Frequent urination at night   . Chronic kidney disease     stage 3; low kidney function; BUN was high acc to pt  . Hypothyroidism     CURRENT MEDICATIONS: Reviewed per MAR/see medication list  Allergies  Allergen  Reactions  . Onglyza [Saxagliptin]     Made legs and ankles swell  . Polysporin [Bacitracin-Polymyxin B] Dermatitis    Allergic to ointments such as polysporin, neosporin, cortisporin  . Cephalosporins Other (See Comments)  . Lisinopril Cough  . Nsaids     Stage 3 kidney disease  . Penicillins Hives  . Victoza [Liraglutide]     constipation  . Mucinex [Guaifenesin Er] Rash     REVIEW OF SYSTEMS:  GENERAL: no change in appetite, no fatigue, no weight changes, no fever, chills or weakness RESPIRATORY: no cough, SOB, DOE, wheezing, hemoptysis CARDIAC: no chest pain, edema or palpitations GI: no abdominal pain, diarrhea, heart burn, nausea or vomiting, +constipation  PHYSICAL EXAMINATION  GENERAL: no acute distress, obese SKIN:  Surgical incision on lower back is dry, no erythema, covered with dry dressing, pressure ulcer on right buttock EYES: conjunctivae normal, sclerae normal, normal eye lids NECK: supple, trachea midline, no neck masses, no thyroid tenderness, no thyromegaly LYMPHATICS: no LAN in the neck, no supraclavicular LAN RESPIRATORY: breathing is even & unlabored, BS CTAB CARDIAC: RRR, no murmur,no extra heart sounds, no edema GI: abdomen soft, normal BS, no masses, no tenderness, no hepatomegaly, no splenomegaly EXTREMITIES:  Able to move 4 extremities; Aspen brace on lumbar spine PSYCHIATRIC: the patient is alert & oriented to person, affect & behavior appropriate  LABS/RADIOLOGY: Labs reviewed: Basic Metabolic Panel:  Recent Labs  08/05/14 0930 10/30/14 1305  NA  --  138  K  --  3.9  CL  --  103  CO2  --  25  GLUCOSE  --  139*  BUN 26* 26*  CREATININE 1.34* 1.30*  CALCIUM  --  9.4   CBC:  Recent Labs  10/30/14 1305  WBC 8.0  HGB 13.3  HCT 38.6  MCV 86.5  PLT 235   CBG:  Recent Labs  11/11/14 2126 11/12/14 0611 11/12/14 1136  GLUCAP 79 158* 203*     Dg Lumbar Spine 2-3 Views  11/07/2014   CLINICAL DATA:  Spondylolisthesis.  L4-5  fusion.  EXAM: LUMBAR SPINE - 2-3 VIEW; DG C-ARM 61-120 MIN  FLUOROSCOPY TIME:  C-arm fluoroscopic images were obtained intraoperatively and submitted for post operative interpretation. Please see the performing provider's procedural report for the fluoroscopy time utilized.  COMPARISON:  Lumbar spine CT myelogram 08/05/2014  FINDINGS: Frontal and lateral intraoperative spot fluoroscopic images of the lower lumbar spine are provided. These demonstrate performance of L4-5 fusion. Bilateral L4 and L5 pedicle screws are present, as well as an L4-5 interbody spacer. Tissue retractors are present posteriorly. Slight anterolisthesis is again seen of L4 on L5.  IMPRESSION: Intraoperative images during L4-5 fusion.   Electronically Signed   By: Logan Bores   On: 11/07/2014 14:23   Dg C-arm 1-60 Min  11/07/2014   CLINICAL DATA:  Spondylolisthesis.  L4-5 fusion.  EXAM: LUMBAR SPINE - 2-3 VIEW; DG C-ARM 61-120 MIN  FLUOROSCOPY TIME:  C-arm fluoroscopic images were obtained intraoperatively and submitted for post operative interpretation. Please see the performing provider's procedural report for the fluoroscopy time utilized.  COMPARISON:  Lumbar spine CT myelogram 08/05/2014  FINDINGS: Frontal and lateral intraoperative spot fluoroscopic images of the lower lumbar spine are provided. These demonstrate performance of L4-5 fusion. Bilateral L4 and L5 pedicle screws are present, as well as an L4-5 interbody spacer. Tissue retractors are present posteriorly. Slight anterolisthesis is again seen of L4 on L5.  IMPRESSION: Intraoperative images during L4-5 fusion.   Electronically Signed   By: Logan Bores   On: 11/07/2014 14:23    ASSESSMENT/PLAN:  Spondylolisthesis of lumbar region S/P fusion and decompression - for rehabilitation; follow-up with Dr. Erline Levine, neurosurgery; start tramadol 50 mg 1 tab by mouth twice a day and continue tramadol 50 mg 1 tab by mouth every 6 hours when necessary for pain Diabetes  mellitus, type II - hemoglobin A1c 7.2; continue Levemir 50 units subcutaneous every morning Hypertension - continue amlodipine 5 mg 1 tab by mouth daily, Coreg 6.25 mg 1 tab by mouth twice a day and Hyzaar 100-25 milligrams 1 tab by mouth daily; BP every shift 1 week Hormone replacement therapy - continue Estrace vaginal 0.1 mg/GM 1 applicatorful vaginally daily at bedtime 2 times/week every Mondays and Fridays Allergic rhinitis - continue Flonase 50 g/ACT 1 spray into the nose daily when necessary Neuropathy - continue Neurontin 400 mg 1 capsule by mouth 3 times a day Hypothyroidism - continue levothyroxine 150 g 1 tab by mouth daily Insomnia - continue melatonin 10 mg 1 tab by mouth daily at bedtime Urinary frequency - continue oxybutynin 10 mg 24-hour 1 tab by mouth daily at bedtime Hyperlipidemia - continue pravastatin 80 mg 1 tab by mouth every morning Frequent UTI - continue Trimpex 100 mg 1 tab by mouth daily at bedtime Pressure ulcer on right buttock, stage II - continue treatment with hydrocolloid Depression - mood is stable; continue Zoloft 100 mg give 1-1/2 tab = 150  mg by mouth every morning Constipation - start senna S2 tabs by mouth twice a day and MiraLAX 17 g last 4-6 ounces liquid by mouth twice a day   Goals of care:  Short-term rehabilitation  Labs/test ordered:  CBC, CMP and TSH   Spent 50 minutes in patient care.   [facility], NP [employer] [phone]

## 2014-11-14 ENCOUNTER — Non-Acute Institutional Stay (SKILLED_NURSING_FACILITY): Payer: Medicare Other | Admitting: Internal Medicine

## 2014-11-14 ENCOUNTER — Encounter: Payer: Self-pay | Admitting: Internal Medicine

## 2014-11-14 DIAGNOSIS — E785 Hyperlipidemia, unspecified: Secondary | ICD-10-CM

## 2014-11-14 DIAGNOSIS — N3090 Cystitis, unspecified without hematuria: Secondary | ICD-10-CM

## 2014-11-14 DIAGNOSIS — L8990 Pressure ulcer of unspecified site, unspecified stage: Secondary | ICD-10-CM | POA: Diagnosis not present

## 2014-11-14 DIAGNOSIS — M4316 Spondylolisthesis, lumbar region: Secondary | ICD-10-CM

## 2014-11-14 DIAGNOSIS — K5900 Constipation, unspecified: Secondary | ICD-10-CM | POA: Diagnosis not present

## 2014-11-14 DIAGNOSIS — N3080 Other cystitis without hematuria: Secondary | ICD-10-CM | POA: Diagnosis not present

## 2014-11-14 DIAGNOSIS — E1142 Type 2 diabetes mellitus with diabetic polyneuropathy: Secondary | ICD-10-CM

## 2014-11-14 DIAGNOSIS — G4700 Insomnia, unspecified: Secondary | ICD-10-CM | POA: Diagnosis not present

## 2014-11-14 DIAGNOSIS — N3281 Overactive bladder: Secondary | ICD-10-CM

## 2014-11-14 DIAGNOSIS — I10 Essential (primary) hypertension: Secondary | ICD-10-CM

## 2014-11-14 DIAGNOSIS — M792 Neuralgia and neuritis, unspecified: Secondary | ICD-10-CM | POA: Diagnosis not present

## 2014-11-14 DIAGNOSIS — E039 Hypothyroidism, unspecified: Secondary | ICD-10-CM

## 2014-11-14 DIAGNOSIS — F322 Major depressive disorder, single episode, severe without psychotic features: Secondary | ICD-10-CM

## 2014-11-14 DIAGNOSIS — G629 Polyneuropathy, unspecified: Secondary | ICD-10-CM

## 2014-11-14 DIAGNOSIS — F329 Major depressive disorder, single episode, unspecified: Secondary | ICD-10-CM

## 2014-11-14 NOTE — Progress Notes (Signed)
Patient ID: Abigail [name], female   DOB: [birth year], 74 y.o.   MRN: [id number]     [place]  PCP:  Melinda Crutch, MD  Code Status: Full Code   Allergies  Allergen Reactions  . Onglyza [Saxagliptin]     Made legs and ankles swell  . Polysporin [Bacitracin-Polymyxin B] Dermatitis    Allergic to ointments such as polysporin, neosporin, cortisporin  . Cephalosporins Other (See Comments)  . Lisinopril Cough  . Nsaids     Stage 3 kidney disease  . Penicillins Hives  . Victoza [Liraglutide]     constipation  . Mucinex [Guaifenesin Er] Rash    Chief Complaint  Patient presents with  . New Admit To SNF    New admission      HPI:  74 year old patient is here for short term rehabilitation post hospital admission with lumbar spinal stenosis with radiculopathy. She underwent L4-5 posterior lumbar interbody fusion with PEEK cages, autograft, pedicle screw fixation and posterolateral arthrodesis. She has PMH of hypertension, CAD, diabetes mellitus, fibromyalgia, CKD, GERD and depression. Her pain is moderately controlled with current pain regimen. She has some urinary incontinence and this has been there post surgery. Denies any numbness or tingling. Had her last bowel movement almost a week back, she has been started on bowel regimen yesterday.   Review of Systems:  Constitutional: Negative for fever, chills,diaphoresis.  HENT: Negative for headache, congestion, nasal discharge Eyes: Negative for eye pain, blurred vision, double vision and discharge.  Respiratory: Negative for cough, shortness of breath and wheezing.   Cardiovascular: Negative for chest pain, palpitations, leg swelling.  Gastrointestinal: Negative for heartburn, nausea, vomiting, abdominal pain Genitourinary: Negative for dysuria Musculoskeletal: Negative for falls Skin: Negative for itching, rash.  Neurological: Negative for dizziness, tingling, focal weakness Psychiatric/Behavioral: Negative for  depression   Past Medical History  Diagnosis Date  . CAD (coronary artery disease)   . Obesity   . Fibromyalgia   . Family hx of colon cancer   . Depression   . Hypertension   . Obesity   . GERD (gastroesophageal reflux disease)     pt denies  . PONV (postoperative nausea and vomiting)     with stapedectomy  . Torn rotator cuff left    current  . Heart murmur   . Diabetes mellitus     Type 2  . Frequency of urination   . Constipation due to pain medication   . Boils     near vaginal area  . Hyperlipidemia   . Itchy eyes   . History of bladder infections   . Frequent urination at night   . Chronic kidney disease     stage 3; low kidney function; BUN was high acc to pt  . Hypothyroidism    Past Surgical History  Procedure Laterality Date  . Dilation and curettage of uterus    . Stapedectomy Left     ear  . Shoulder surgery      bilateral rotator cuff repairs  . Cholecystectomy  1971  . Tubal ligation  1971  . Colonoscopy    . Maximum access (mas)posterior lumbar interbody fusion (plif) 1 level N/A 11/07/2014    Procedure: Lumbar Four-Five Maximum access posterior lumbar interbody fusion;  Surgeon: Erline Levine, MD;  Location: [facility];  Service: Neurosurgery;  Laterality: N/A;  L4-5 Maximum access posterior lumbar interbody fusion   Social History:   reports that she has never smoked. She has never  used smokeless tobacco. She reports that she does not drink alcohol or use illicit drugs.  Family History  Problem Relation Age of Onset  . Colon cancer Brother   . Diabetes Mother     and Sister   . Uterine cancer Sister     Medications: Patient's Medications  New Prescriptions   No medications on file  Previous Medications   AMLODIPINE (NORVASC) 5 MG TABLET    1/2 tablet daily in the morning for hypertension   ASPIRIN EC 81 MG TABLET    Take 81 mg by mouth every evening.   ATORVASTATIN (LIPITOR) 10 MG TABLET    Take 10 mg by mouth daily.   B COMPLEX-C  (B-COMPLEX WITH VITAMIN C) TABLET    Take 1 tablet by mouth every evening.   CARVEDILOL (COREG) 6.25 MG TABLET    Take 6.25 mg by mouth 2 (two) times daily.    CRANBERRY-VITAMIN C-PROBIOTIC (AZO CRANBERRY) 250-30 MG TABS    Take 1 tablet by mouth at bedtime.    CYANOCOBALAMIN (VITAMIN B-12 IJ)    Inject 1,000 mg as directed every 28 (twenty-eight) days.    ESTRACE VAGINAL 0.1 MG/GM VAGINAL CREAM    Place 1 Applicatorful vaginally 2 (two) times a week. Apply at bedtime on Monday and Friday.   FLUTICASONE (FLONASE) 50 MCG/ACT NASAL SPRAY    Place 1 spray into the nose daily as needed for allergies.    GABAPENTIN (NEURONTIN) 400 MG CAPSULE    Take 1 capsule (400 mg total) by mouth 3 (three) times daily.   GARLIC 7017 MG CAPS    Take 1,000 mg by mouth daily.   GINKGO BILOBA 60 MG CAPS    Take by mouth daily.   INSULIN PEN NEEDLE 31G X 5 MM MISC       LEVEMIR FLEXTOUCH 100 UNIT/ML PEN    Inject 50 Units into the skin every morning.    LEVOTHYROXINE (SYNTHROID, LEVOTHROID) 150 MCG TABLET    Take 150 mcg by mouth daily.   LOSARTAN-HYDROCHLOROTHIAZIDE (HYZAAR) 100-25 MG PER TABLET    Take 1 tablet by mouth daily. For Hypertension   MELATONIN 10 MG TABS    Take 1 tablet by mouth at bedtime.   MULTIPLE VITAMIN (MULTIVITAMIN) TABLET    Take 1 tablet by mouth every evening.    MULTIPLE VITAMINS-MINERALS (THERA-M) TABS    Take 1 tablet by mouth daily.   MUPIROCIN OINTMENT (BACTROBAN) 2 %    Apply 1 application topically daily as needed (use for cuts, etc).   OXYBUTYNIN (DITROPAN-XL) 10 MG 24 HR TABLET    Take 10 mg by mouth at bedtime.    POLYETHYLENE GLYCOL (MIRALAX / GLYCOLAX) PACKET    Take 17 g by mouth 2 (two) times daily.   PROBIOTIC PRODUCT (PROBIOTIC DAILY PO)    Take 1 capsule by mouth daily.   SENNOSIDES-DOCUSATE SODIUM (SENOKOT-S) 8.6-50 MG TABLET    Take 2 tablets by mouth 2 (two) times daily.   SERTRALINE (ZOLOFT) 100 MG TABLET    Take 150 mg by mouth every morning.    TRAMADOL (ULTRAM) 50 MG  TABLET    Take 50 mg by mouth every 6 (six) hours as needed.    TRIMETHOPRIM (TRIMPEX) 100 MG TABLET    Take 100 mg by mouth at bedtime.    VITAMIN D, ERGOCALCIFEROL, (DRISDOL) 50000 UNITS CAPS    Take 50,000 Units by mouth every 30 (thirty) days. In the middle of the month  Modified Medications  No medications on file  Discontinued Medications   PRAVASTATIN (PRAVACHOL) 80 MG TABLET    Take 80 mg by mouth every morning.      Physical Exam: Filed Vitals:   11/14/14 1037  BP: 142/76  Pulse: 64  Temp: 97.8 F (36.6 C)  TempSrc: Oral  Resp: 18  Height: 5\' 3"  (1.6 m)  Weight: 226 lb 3.2 oz (102.604 kg)  SpO2: 97%    General- elderly female, in no acute distress Head- normocephalic, atraumatic Throat- moist mucus membrane Eyes- no pallor, no icterus, no discharge, normal conjunctiva, normal sclera Neck- no cervical lymphadenopathy Cardiovascular- normal s1,s2, no murmurs, papable dorsalis pedis, no leg edema Respiratory- bilateral clear to auscultation, no wheeze, no rhonchi, no crackles, no use of accessory muscles Abdomen- bowel sounds present, soft, non tender Musculoskeletal- able to move all 4 extremities, generalized weakness Neurological- no focal deficit Skin- warm and dry, surgical incision on the back with glue Psychiatry- alert and oriented to person, place and time, normal mood and affect    Labs reviewed: Basic Metabolic Panel:  Recent Labs  08/05/14 0930 10/30/14 1305  NA  --  138  K  --  3.9  CL  --  103  CO2  --  25  GLUCOSE  --  139*  BUN 26* 26*  CREATININE 1.34* 1.30*  CALCIUM  --  9.4   CBC:  Recent Labs  10/30/14 1305  WBC 8.0  HGB 13.3  HCT 38.6  MCV 86.5  PLT 235   Cardiac Enzymes: No results for input(s): CKTOTAL, CKMB, CKMBINDEX, TROPONINI in the last 8760 hours. BNP: Invalid input(s): POCBNP CBG:  Recent Labs  11/11/14 2126 11/12/14 0611 11/12/14 1136  GLUCAP 79 158* 203*    Assessment/Plan  Lumbar  Spondylolisthesis  S/P fusion and decompression. Has follow up with neurosurgery. Continue tramadol 50 mg bid with q6h prn for breakthrough was started yesterday, thus monitor. Will have her work with physical therapy and occupational therapy team to help with gait training and muscle strengthening exercises.fall precautions. Skin care. Encourage to be out of bed.   Constipation continue senna s 2 tab bid with miralax bid and monitor, hydration encouraged  HTN Monitor bp. Continue amlodipine 5 mg daily, coreg 6.25 mg bid and hyzaar  Diabetes mellitus type 2 with neuropathy hemoglobin A1c 7.2. Monitor cbg, continue Levemir 50 units daily and statin  Neuropathic pain continue Neurontin 400 mg tid  Right buttock stage 2 PU Continue wound care and pressure ulcer prophylaxis  OAB Continue oxybutynin 10 mg daily  Recurrent cystitis Continue bactrim prophylactically, hydration and perineal hygiene  Hypothyroidism continue levothyroxine 150 mcg daily  Insomnia continue melatonin  Hyperlipidemia continue pravastatin 80 mg daily  Chronic Depression continue Zoloft 150 mg daily   Goals of care: short term rehabilitation   Labs/tests ordered: cbc, cmp  Family/ staff Communication: reviewed care plan with patient and nursing supervisor    Blanchie Serve, MD  [facility] [phone] (Monday-Friday 8 am - 5 pm) [phone] (afterhours)

## 2014-11-20 ENCOUNTER — Encounter: Payer: Self-pay | Admitting: Adult Health

## 2014-11-20 ENCOUNTER — Non-Acute Institutional Stay (SKILLED_NURSING_FACILITY): Payer: Medicare Other | Admitting: Adult Health

## 2014-11-20 DIAGNOSIS — E785 Hyperlipidemia, unspecified: Secondary | ICD-10-CM

## 2014-11-20 DIAGNOSIS — G4700 Insomnia, unspecified: Secondary | ICD-10-CM | POA: Diagnosis not present

## 2014-11-20 DIAGNOSIS — L8990 Pressure ulcer of unspecified site, unspecified stage: Secondary | ICD-10-CM

## 2014-11-20 DIAGNOSIS — E1142 Type 2 diabetes mellitus with diabetic polyneuropathy: Secondary | ICD-10-CM | POA: Diagnosis not present

## 2014-11-20 DIAGNOSIS — N3281 Overactive bladder: Secondary | ICD-10-CM | POA: Diagnosis not present

## 2014-11-20 DIAGNOSIS — E039 Hypothyroidism, unspecified: Secondary | ICD-10-CM

## 2014-11-20 DIAGNOSIS — G629 Polyneuropathy, unspecified: Secondary | ICD-10-CM

## 2014-11-20 DIAGNOSIS — M792 Neuralgia and neuritis, unspecified: Secondary | ICD-10-CM | POA: Diagnosis not present

## 2014-11-20 DIAGNOSIS — N390 Urinary tract infection, site not specified: Secondary | ICD-10-CM

## 2014-11-20 DIAGNOSIS — F329 Major depressive disorder, single episode, unspecified: Secondary | ICD-10-CM

## 2014-11-20 DIAGNOSIS — J309 Allergic rhinitis, unspecified: Secondary | ICD-10-CM

## 2014-11-20 DIAGNOSIS — M4316 Spondylolisthesis, lumbar region: Secondary | ICD-10-CM | POA: Diagnosis not present

## 2014-11-20 DIAGNOSIS — I10 Essential (primary) hypertension: Secondary | ICD-10-CM

## 2014-11-20 DIAGNOSIS — F32A Depression, unspecified: Secondary | ICD-10-CM

## 2014-11-20 DIAGNOSIS — Z79890 Hormone replacement therapy (postmenopausal): Secondary | ICD-10-CM

## 2014-11-20 DIAGNOSIS — K5900 Constipation, unspecified: Secondary | ICD-10-CM

## 2014-11-20 NOTE — Progress Notes (Signed)
Patient ID: Abigail [name], female   DOB: 03/24/1941, 74 y.o.   MRN: [id number]   11/20/2014  Facility:  [facility]ursing Home Location:  [place] Room Number: 175-Z LEVEL OF CARE:  SNF (31)   Chief Complaint  Patient presents with  . Discharge Note    Spondylolisthesis S/P lumbar fusion and decompression, diabetes mellitus, hypertension, HRT, allergic rhinitis, neuropathy, hypothyroidism, insomnia, urinary frequency, hyperlipidemia, frequent UTI, pressure ulcer, depression and constipation    HISTORY OF PRESENT ILLNESS:  This is a 73 year old female who is for discharge home with Home health PT for ambulation, balance, transfers & precautions, OT for self care, Nurse for skin assessment/treatment & disease management and Home health aide for bathing assistance. DME: 3-in 1 bedside commode. She has  been admitted to [facility] on 11/12/14 from [facility]. She has PMH of hypertension, shingles, CAD, diabetes mellitus, fibromyalgia, CKD, GERD and depression. She had stenosis, spondylolisthesis, and radiculopathy. On 6/10, she had Lumbar Four-Five Maximum access posterior lumbar interbody fusion (N/A) - L4-5 Maximum access posterior lumbar interbody fusion with PEEK cages, autograft, pedicle screw fixation, posterolateral arthrodesis.  Patient was admitted to this facility for short-term rehabilitation after the patient's recent hospitalization.  Patient has completed SNF rehabilitation and therapy has cleared the patient for discharge.   PAST MEDICAL HISTORY:  Past Medical History  Diagnosis Date  . CAD (coronary artery disease)   . Obesity   . Fibromyalgia   . Family hx of colon cancer   . Depression   . Hypertension   . Obesity   . GERD (gastroesophageal reflux disease)     pt denies  . PONV (postoperative nausea and vomiting)     with stapedectomy  . Torn rotator cuff left    current  . Heart murmur   . Diabetes mellitus     Type 2  .  Frequency of urination   . Constipation due to pain medication   . Boils     near vaginal area  . Hyperlipidemia   . Itchy eyes   . History of bladder infections   . Frequent urination at night   . Chronic kidney disease     stage 3; low kidney function; BUN was high acc to pt  . Hypothyroidism     CURRENT MEDICATIONS: Reviewed per MAR/see medication list  Allergies  Allergen Reactions  . Onglyza [Saxagliptin]     Made legs and ankles swell  . Polysporin [Bacitracin-Polymyxin B] Dermatitis    Allergic to ointments such as polysporin, neosporin, cortisporin  . Cephalosporins Other (See Comments)  . Lisinopril Cough  . Nsaids     Stage 3 kidney disease  . Penicillins Hives  . Victoza [Liraglutide]     constipation  . Mucinex [Guaifenesin Er] Rash     REVIEW OF SYSTEMS:  GENERAL: no change in appetite, no fatigue, no weight changes, no fever, chills or weakness RESPIRATORY: no cough, SOB, DOE, wheezing, hemoptysis CARDIAC: no chest pain, edema or palpitations GI: no abdominal pain, diarrhea, heart burn, nausea or vomiting  PHYSICAL EXAMINATION  GENERAL: no acute distress, obese SKIN:  Surgical incision on lower back is dry, no erythema, covered with dry dressing, pressure ulcer on right buttock stage 2 NECK: supple, trachea midline, no neck masses, no thyroid tenderness, no thyromegaly LYMPHATICS: no LAN in the neck, no supraclavicular LAN RESPIRATORY: breathing is even & unlabored, BS CTAB CARDIAC: RRR, no murmur,no extra heart sounds, no edema GI: abdomen  soft, normal BS, no masses, no tenderness, no hepatomegaly, no splenomegaly EXTREMITIES:  Able to move 4 extremities; Aspen brace on lumbar spine PSYCHIATRIC: the patient is alert & oriented to person, affect & behavior appropriate  LABS/RADIOLOGY: Labs reviewed: 11/14/14  WBC 7.2 hemoglobin 10.2 hematocrit 0.1 MCV 88.8 platelet 340 sodium 139 potassium 4.4 glucose 166 BUN 21 creatinine 1.39 total bilirubin 0.5  alkaline phosphatase 93 SGOT 19 SGPT 16 total protein 5.5 albumin 3.0 calcium 8.8 TSH 2.119 Basic Metabolic Panel:  Recent Labs  08/05/14 0930 10/30/14 1305  NA  --  138  K  --  3.9  CL  --  103  CO2  --  25  GLUCOSE  --  139*  BUN 26* 26*  CREATININE 1.34* 1.30*  CALCIUM  --  9.4   CBC:  Recent Labs  10/30/14 1305  WBC 8.0  HGB 13.3  HCT 38.6  MCV 86.5  PLT 235   CBG:  Recent Labs  11/11/14 2126 11/12/14 0611 11/12/14 1136  GLUCAP 79 158* 203*     Dg Lumbar Spine 2-3 Views  11/07/2014   CLINICAL DATA:  Spondylolisthesis.  L4-5 fusion.  EXAM: LUMBAR SPINE - 2-3 VIEW; DG C-ARM 61-120 MIN  FLUOROSCOPY TIME:  C-arm fluoroscopic images were obtained intraoperatively and submitted for post operative interpretation. Please see the performing provider's procedural report for the fluoroscopy time utilized.  COMPARISON:  Lumbar spine CT myelogram 08/05/2014  FINDINGS: Frontal and lateral intraoperative spot fluoroscopic images of the lower lumbar spine are provided. These demonstrate performance of L4-5 fusion. Bilateral L4 and L5 pedicle screws are present, as well as an L4-5 interbody spacer. Tissue retractors are present posteriorly. Slight anterolisthesis is again seen of L4 on L5.  IMPRESSION: Intraoperative images during L4-5 fusion.   Electronically Signed   By: Logan Bores   On: 11/07/2014 14:23   Dg C-arm 1-60 Min  11/07/2014   CLINICAL DATA:  Spondylolisthesis.  L4-5 fusion.  EXAM: LUMBAR SPINE - 2-3 VIEW; DG C-ARM 61-120 MIN  FLUOROSCOPY TIME:  C-arm fluoroscopic images were obtained intraoperatively and submitted for post operative interpretation. Please see the performing provider's procedural report for the fluoroscopy time utilized.  COMPARISON:  Lumbar spine CT myelogram 08/05/2014  FINDINGS: Frontal and lateral intraoperative spot fluoroscopic images of the lower lumbar spine are provided. These demonstrate performance of L4-5 fusion. Bilateral L4 and L5 pedicle  screws are present, as well as an L4-5 interbody spacer. Tissue retractors are present posteriorly. Slight anterolisthesis is again seen of L4 on L5.  IMPRESSION: Intraoperative images during L4-5 fusion.   Electronically Signed   By: Logan Bores   On: 11/07/2014 14:23    ASSESSMENT/PLAN:  Spondylolisthesis of lumbar region S/P fusion and decompression - for home health PT, OT, nursing and home health aide; follow-up with Dr. Erline Levine, neurosurgery; start tramadol 50 mg 1 tab by mouth Q 8 hours routinely X 3 days then tramadol 50 mg 1 tab by mouth every 6 hours when necessary for pain and start Lidoderm 5 % 1 patch transdermally to sacral area Q D Diabetes mellitus, type II - hemoglobin A1c 7.2; continue Levemir 50 units subcutaneous every morning Hypertension - recently increased amlodipine to 10mg  1 tab by mouth daily, Coreg 6.25 mg 1 tab by mouth twice a day and Hyzaar 100-25 milligrams 1 tab by mouth daily Hormone replacement therapy - continue Estrace vaginal 0.1 mg/GM 1 applicatorful vaginally daily at bedtime 2 times/week every Mondays and Fridays Allergic rhinitis -  continue Flonase 50 g/ACT 1 spray into the nose daily when necessary Neuropathy - continue Neurontin 400 mg 1 capsule by mouth 3 times a day Hypothyroidism -  tsh 6.646; continue levothyroxine 150 g 1 tab by mouth daily Insomnia - continue melatonin 10 mg 1 tab by mouth daily at bedtime Overactive bladder - continue oxybutynin 10 mg 24-hour 1 tab by mouth daily at bedtime Hyperlipidemia - continue pravastatin 80 mg 1 tab by mouth every morning Frequent UTI - continue Trimpex 100 mg 1 tab by mouth daily at bedtime Pressure ulcer on right buttock, stage II - continue treatment with hydrocolloid Depression - mood is stable; continue Zoloft 100 mg give 1-1/2 tab = 150 mg by mouth every morning Constipation - start senna S2 tabs by mouth twice a day and MiraLAX 17 g last 4-6 ounces liquid by mouth twice a day    I have  filled out patient's discharge paperwork and written prescriptions.  Patient will receive home health PT, OT, Nrsing and home health aide.  DME provided:  3-in-1 bedside commode  Total discharge time: Greater than 30 minutes  Discharge time involved coordination of the discharge process with social worker, nursing staff and therapy department. Medical justification for home health services/DME verified.     [facility], NP [employer] [phone]

## 2014-11-26 DIAGNOSIS — E1140 Type 2 diabetes mellitus with diabetic neuropathy, unspecified: Secondary | ICD-10-CM | POA: Diagnosis not present

## 2014-11-26 DIAGNOSIS — Z4789 Encounter for other orthopedic aftercare: Secondary | ICD-10-CM | POA: Diagnosis not present

## 2014-11-26 DIAGNOSIS — F329 Major depressive disorder, single episode, unspecified: Secondary | ICD-10-CM | POA: Diagnosis not present

## 2014-11-26 DIAGNOSIS — M797 Fibromyalgia: Secondary | ICD-10-CM | POA: Diagnosis not present

## 2014-11-26 DIAGNOSIS — Z794 Long term (current) use of insulin: Secondary | ICD-10-CM | POA: Diagnosis not present

## 2014-11-26 DIAGNOSIS — L989 Disorder of the skin and subcutaneous tissue, unspecified: Secondary | ICD-10-CM | POA: Diagnosis not present

## 2014-11-26 DIAGNOSIS — K219 Gastro-esophageal reflux disease without esophagitis: Secondary | ICD-10-CM | POA: Diagnosis not present

## 2014-11-27 DIAGNOSIS — F329 Major depressive disorder, single episode, unspecified: Secondary | ICD-10-CM | POA: Diagnosis not present

## 2014-11-27 DIAGNOSIS — K219 Gastro-esophageal reflux disease without esophagitis: Secondary | ICD-10-CM | POA: Diagnosis not present

## 2014-11-27 DIAGNOSIS — E1140 Type 2 diabetes mellitus with diabetic neuropathy, unspecified: Secondary | ICD-10-CM | POA: Diagnosis not present

## 2014-11-27 DIAGNOSIS — Z4789 Encounter for other orthopedic aftercare: Secondary | ICD-10-CM | POA: Diagnosis not present

## 2014-11-27 DIAGNOSIS — M797 Fibromyalgia: Secondary | ICD-10-CM | POA: Diagnosis not present

## 2014-11-27 DIAGNOSIS — L989 Disorder of the skin and subcutaneous tissue, unspecified: Secondary | ICD-10-CM | POA: Diagnosis not present

## 2014-11-28 DIAGNOSIS — K219 Gastro-esophageal reflux disease without esophagitis: Secondary | ICD-10-CM | POA: Diagnosis not present

## 2014-11-28 DIAGNOSIS — M797 Fibromyalgia: Secondary | ICD-10-CM | POA: Diagnosis not present

## 2014-11-28 DIAGNOSIS — E1140 Type 2 diabetes mellitus with diabetic neuropathy, unspecified: Secondary | ICD-10-CM | POA: Diagnosis not present

## 2014-11-28 DIAGNOSIS — Z4789 Encounter for other orthopedic aftercare: Secondary | ICD-10-CM | POA: Diagnosis not present

## 2014-11-28 DIAGNOSIS — L989 Disorder of the skin and subcutaneous tissue, unspecified: Secondary | ICD-10-CM | POA: Diagnosis not present

## 2014-11-28 DIAGNOSIS — F329 Major depressive disorder, single episode, unspecified: Secondary | ICD-10-CM | POA: Diagnosis not present

## 2014-11-29 DIAGNOSIS — M797 Fibromyalgia: Secondary | ICD-10-CM | POA: Diagnosis not present

## 2014-11-29 DIAGNOSIS — F329 Major depressive disorder, single episode, unspecified: Secondary | ICD-10-CM | POA: Diagnosis not present

## 2014-11-29 DIAGNOSIS — Z4789 Encounter for other orthopedic aftercare: Secondary | ICD-10-CM | POA: Diagnosis not present

## 2014-11-29 DIAGNOSIS — K219 Gastro-esophageal reflux disease without esophagitis: Secondary | ICD-10-CM | POA: Diagnosis not present

## 2014-11-29 DIAGNOSIS — E1140 Type 2 diabetes mellitus with diabetic neuropathy, unspecified: Secondary | ICD-10-CM | POA: Diagnosis not present

## 2014-11-29 DIAGNOSIS — L989 Disorder of the skin and subcutaneous tissue, unspecified: Secondary | ICD-10-CM | POA: Diagnosis not present

## 2014-12-01 DIAGNOSIS — E1140 Type 2 diabetes mellitus with diabetic neuropathy, unspecified: Secondary | ICD-10-CM | POA: Diagnosis not present

## 2014-12-01 DIAGNOSIS — Z4789 Encounter for other orthopedic aftercare: Secondary | ICD-10-CM | POA: Diagnosis not present

## 2014-12-01 DIAGNOSIS — M797 Fibromyalgia: Secondary | ICD-10-CM | POA: Diagnosis not present

## 2014-12-01 DIAGNOSIS — L989 Disorder of the skin and subcutaneous tissue, unspecified: Secondary | ICD-10-CM | POA: Diagnosis not present

## 2014-12-01 DIAGNOSIS — K219 Gastro-esophageal reflux disease without esophagitis: Secondary | ICD-10-CM | POA: Diagnosis not present

## 2014-12-01 DIAGNOSIS — F329 Major depressive disorder, single episode, unspecified: Secondary | ICD-10-CM | POA: Diagnosis not present

## 2014-12-02 DIAGNOSIS — M797 Fibromyalgia: Secondary | ICD-10-CM | POA: Diagnosis not present

## 2014-12-02 DIAGNOSIS — L989 Disorder of the skin and subcutaneous tissue, unspecified: Secondary | ICD-10-CM | POA: Diagnosis not present

## 2014-12-02 DIAGNOSIS — Z4789 Encounter for other orthopedic aftercare: Secondary | ICD-10-CM | POA: Diagnosis not present

## 2014-12-02 DIAGNOSIS — K219 Gastro-esophageal reflux disease without esophagitis: Secondary | ICD-10-CM | POA: Diagnosis not present

## 2014-12-02 DIAGNOSIS — E1140 Type 2 diabetes mellitus with diabetic neuropathy, unspecified: Secondary | ICD-10-CM | POA: Diagnosis not present

## 2014-12-02 DIAGNOSIS — F329 Major depressive disorder, single episode, unspecified: Secondary | ICD-10-CM | POA: Diagnosis not present

## 2014-12-03 DIAGNOSIS — M545 Low back pain: Secondary | ICD-10-CM | POA: Diagnosis not present

## 2014-12-03 DIAGNOSIS — M4806 Spinal stenosis, lumbar region: Secondary | ICD-10-CM | POA: Diagnosis not present

## 2014-12-03 DIAGNOSIS — M4317 Spondylolisthesis, lumbosacral region: Secondary | ICD-10-CM | POA: Diagnosis not present

## 2014-12-03 DIAGNOSIS — Z4789 Encounter for other orthopedic aftercare: Secondary | ICD-10-CM | POA: Diagnosis not present

## 2014-12-03 DIAGNOSIS — K219 Gastro-esophageal reflux disease without esophagitis: Secondary | ICD-10-CM | POA: Diagnosis not present

## 2014-12-03 DIAGNOSIS — E1140 Type 2 diabetes mellitus with diabetic neuropathy, unspecified: Secondary | ICD-10-CM | POA: Diagnosis not present

## 2014-12-03 DIAGNOSIS — I10 Essential (primary) hypertension: Secondary | ICD-10-CM | POA: Diagnosis not present

## 2014-12-03 DIAGNOSIS — F329 Major depressive disorder, single episode, unspecified: Secondary | ICD-10-CM | POA: Diagnosis not present

## 2014-12-03 DIAGNOSIS — M797 Fibromyalgia: Secondary | ICD-10-CM | POA: Diagnosis not present

## 2014-12-03 DIAGNOSIS — L989 Disorder of the skin and subcutaneous tissue, unspecified: Secondary | ICD-10-CM | POA: Diagnosis not present

## 2014-12-03 DIAGNOSIS — Z6837 Body mass index (BMI) 37.0-37.9, adult: Secondary | ICD-10-CM | POA: Diagnosis not present

## 2014-12-03 DIAGNOSIS — M5417 Radiculopathy, lumbosacral region: Secondary | ICD-10-CM | POA: Diagnosis not present

## 2014-12-04 DIAGNOSIS — M797 Fibromyalgia: Secondary | ICD-10-CM | POA: Diagnosis not present

## 2014-12-04 DIAGNOSIS — E1140 Type 2 diabetes mellitus with diabetic neuropathy, unspecified: Secondary | ICD-10-CM | POA: Diagnosis not present

## 2014-12-04 DIAGNOSIS — K219 Gastro-esophageal reflux disease without esophagitis: Secondary | ICD-10-CM | POA: Diagnosis not present

## 2014-12-04 DIAGNOSIS — F329 Major depressive disorder, single episode, unspecified: Secondary | ICD-10-CM | POA: Diagnosis not present

## 2014-12-04 DIAGNOSIS — L989 Disorder of the skin and subcutaneous tissue, unspecified: Secondary | ICD-10-CM | POA: Diagnosis not present

## 2014-12-04 DIAGNOSIS — Z4789 Encounter for other orthopedic aftercare: Secondary | ICD-10-CM | POA: Diagnosis not present

## 2014-12-05 DIAGNOSIS — K219 Gastro-esophageal reflux disease without esophagitis: Secondary | ICD-10-CM | POA: Diagnosis not present

## 2014-12-05 DIAGNOSIS — M797 Fibromyalgia: Secondary | ICD-10-CM | POA: Diagnosis not present

## 2014-12-05 DIAGNOSIS — L989 Disorder of the skin and subcutaneous tissue, unspecified: Secondary | ICD-10-CM | POA: Diagnosis not present

## 2014-12-05 DIAGNOSIS — E1140 Type 2 diabetes mellitus with diabetic neuropathy, unspecified: Secondary | ICD-10-CM | POA: Diagnosis not present

## 2014-12-05 DIAGNOSIS — F329 Major depressive disorder, single episode, unspecified: Secondary | ICD-10-CM | POA: Diagnosis not present

## 2014-12-05 DIAGNOSIS — Z4789 Encounter for other orthopedic aftercare: Secondary | ICD-10-CM | POA: Diagnosis not present

## 2014-12-06 DIAGNOSIS — E1140 Type 2 diabetes mellitus with diabetic neuropathy, unspecified: Secondary | ICD-10-CM | POA: Diagnosis not present

## 2014-12-06 DIAGNOSIS — M797 Fibromyalgia: Secondary | ICD-10-CM | POA: Diagnosis not present

## 2014-12-06 DIAGNOSIS — L989 Disorder of the skin and subcutaneous tissue, unspecified: Secondary | ICD-10-CM | POA: Diagnosis not present

## 2014-12-06 DIAGNOSIS — F329 Major depressive disorder, single episode, unspecified: Secondary | ICD-10-CM | POA: Diagnosis not present

## 2014-12-06 DIAGNOSIS — K219 Gastro-esophageal reflux disease without esophagitis: Secondary | ICD-10-CM | POA: Diagnosis not present

## 2014-12-06 DIAGNOSIS — Z4789 Encounter for other orthopedic aftercare: Secondary | ICD-10-CM | POA: Diagnosis not present

## 2014-12-08 DIAGNOSIS — K219 Gastro-esophageal reflux disease without esophagitis: Secondary | ICD-10-CM | POA: Diagnosis not present

## 2014-12-08 DIAGNOSIS — F329 Major depressive disorder, single episode, unspecified: Secondary | ICD-10-CM | POA: Diagnosis not present

## 2014-12-08 DIAGNOSIS — Z4789 Encounter for other orthopedic aftercare: Secondary | ICD-10-CM | POA: Diagnosis not present

## 2014-12-08 DIAGNOSIS — M797 Fibromyalgia: Secondary | ICD-10-CM | POA: Diagnosis not present

## 2014-12-08 DIAGNOSIS — E1140 Type 2 diabetes mellitus with diabetic neuropathy, unspecified: Secondary | ICD-10-CM | POA: Diagnosis not present

## 2014-12-08 DIAGNOSIS — L989 Disorder of the skin and subcutaneous tissue, unspecified: Secondary | ICD-10-CM | POA: Diagnosis not present

## 2014-12-09 DIAGNOSIS — F329 Major depressive disorder, single episode, unspecified: Secondary | ICD-10-CM | POA: Diagnosis not present

## 2014-12-09 DIAGNOSIS — L989 Disorder of the skin and subcutaneous tissue, unspecified: Secondary | ICD-10-CM | POA: Diagnosis not present

## 2014-12-09 DIAGNOSIS — E1140 Type 2 diabetes mellitus with diabetic neuropathy, unspecified: Secondary | ICD-10-CM | POA: Diagnosis not present

## 2014-12-09 DIAGNOSIS — M797 Fibromyalgia: Secondary | ICD-10-CM | POA: Diagnosis not present

## 2014-12-09 DIAGNOSIS — Z4789 Encounter for other orthopedic aftercare: Secondary | ICD-10-CM | POA: Diagnosis not present

## 2014-12-09 DIAGNOSIS — K219 Gastro-esophageal reflux disease without esophagitis: Secondary | ICD-10-CM | POA: Diagnosis not present

## 2014-12-10 DIAGNOSIS — N390 Urinary tract infection, site not specified: Secondary | ICD-10-CM | POA: Diagnosis not present

## 2014-12-10 DIAGNOSIS — R300 Dysuria: Secondary | ICD-10-CM | POA: Diagnosis not present

## 2014-12-10 DIAGNOSIS — I10 Essential (primary) hypertension: Secondary | ICD-10-CM | POA: Diagnosis not present

## 2014-12-11 DIAGNOSIS — Z4789 Encounter for other orthopedic aftercare: Secondary | ICD-10-CM | POA: Diagnosis not present

## 2014-12-11 DIAGNOSIS — L989 Disorder of the skin and subcutaneous tissue, unspecified: Secondary | ICD-10-CM | POA: Diagnosis not present

## 2014-12-11 DIAGNOSIS — M797 Fibromyalgia: Secondary | ICD-10-CM | POA: Diagnosis not present

## 2014-12-11 DIAGNOSIS — E1140 Type 2 diabetes mellitus with diabetic neuropathy, unspecified: Secondary | ICD-10-CM | POA: Diagnosis not present

## 2014-12-11 DIAGNOSIS — K219 Gastro-esophageal reflux disease without esophagitis: Secondary | ICD-10-CM | POA: Diagnosis not present

## 2014-12-11 DIAGNOSIS — F329 Major depressive disorder, single episode, unspecified: Secondary | ICD-10-CM | POA: Diagnosis not present

## 2014-12-15 DIAGNOSIS — K219 Gastro-esophageal reflux disease without esophagitis: Secondary | ICD-10-CM | POA: Diagnosis not present

## 2014-12-15 DIAGNOSIS — E1140 Type 2 diabetes mellitus with diabetic neuropathy, unspecified: Secondary | ICD-10-CM | POA: Diagnosis not present

## 2014-12-15 DIAGNOSIS — L989 Disorder of the skin and subcutaneous tissue, unspecified: Secondary | ICD-10-CM | POA: Diagnosis not present

## 2014-12-15 DIAGNOSIS — F329 Major depressive disorder, single episode, unspecified: Secondary | ICD-10-CM | POA: Diagnosis not present

## 2014-12-15 DIAGNOSIS — M797 Fibromyalgia: Secondary | ICD-10-CM | POA: Diagnosis not present

## 2014-12-15 DIAGNOSIS — Z4789 Encounter for other orthopedic aftercare: Secondary | ICD-10-CM | POA: Diagnosis not present

## 2014-12-17 DIAGNOSIS — F329 Major depressive disorder, single episode, unspecified: Secondary | ICD-10-CM | POA: Diagnosis not present

## 2014-12-17 DIAGNOSIS — L989 Disorder of the skin and subcutaneous tissue, unspecified: Secondary | ICD-10-CM | POA: Diagnosis not present

## 2014-12-17 DIAGNOSIS — E1140 Type 2 diabetes mellitus with diabetic neuropathy, unspecified: Secondary | ICD-10-CM | POA: Diagnosis not present

## 2014-12-17 DIAGNOSIS — K219 Gastro-esophageal reflux disease without esophagitis: Secondary | ICD-10-CM | POA: Diagnosis not present

## 2014-12-17 DIAGNOSIS — M797 Fibromyalgia: Secondary | ICD-10-CM | POA: Diagnosis not present

## 2014-12-17 DIAGNOSIS — Z4789 Encounter for other orthopedic aftercare: Secondary | ICD-10-CM | POA: Diagnosis not present

## 2014-12-18 DIAGNOSIS — Z4789 Encounter for other orthopedic aftercare: Secondary | ICD-10-CM | POA: Diagnosis not present

## 2014-12-18 DIAGNOSIS — F329 Major depressive disorder, single episode, unspecified: Secondary | ICD-10-CM | POA: Diagnosis not present

## 2014-12-18 DIAGNOSIS — E1140 Type 2 diabetes mellitus with diabetic neuropathy, unspecified: Secondary | ICD-10-CM | POA: Diagnosis not present

## 2014-12-18 DIAGNOSIS — M797 Fibromyalgia: Secondary | ICD-10-CM | POA: Diagnosis not present

## 2014-12-18 DIAGNOSIS — K219 Gastro-esophageal reflux disease without esophagitis: Secondary | ICD-10-CM | POA: Diagnosis not present

## 2014-12-18 DIAGNOSIS — L989 Disorder of the skin and subcutaneous tissue, unspecified: Secondary | ICD-10-CM | POA: Diagnosis not present

## 2014-12-19 ENCOUNTER — Emergency Department (INDEPENDENT_AMBULATORY_CARE_PROVIDER_SITE_OTHER)
Admission: EM | Admit: 2014-12-19 | Discharge: 2014-12-19 | Disposition: A | Discharge status: Home / Self Care | Payer: Medicare Other | Source: Home / Self Care | Attending: Family Medicine | Admitting: Family Medicine

## 2014-12-19 ENCOUNTER — Ambulatory Visit (HOSPITAL_BASED_OUTPATIENT_CLINIC_OR_DEPARTMENT_OTHER)
Admission: RE | Admit: 2014-12-19 | Discharge: 2014-12-19 | Disposition: A | Discharge status: Home / Self Care | Payer: Medicare Other | Source: Ambulatory Visit | Attending: Family Medicine | Admitting: Family Medicine

## 2014-12-19 ENCOUNTER — Encounter: Payer: Self-pay | Admitting: *Deleted

## 2014-12-19 ENCOUNTER — Emergency Department (INDEPENDENT_AMBULATORY_CARE_PROVIDER_SITE_OTHER): Payer: Medicare Other

## 2014-12-19 DIAGNOSIS — I82409 Acute embolism and thrombosis of unspecified deep veins of unspecified lower extremity: Secondary | ICD-10-CM

## 2014-12-19 DIAGNOSIS — I8001 Phlebitis and thrombophlebitis of superficial vessels of right lower extremity: Secondary | ICD-10-CM | POA: Insufficient documentation

## 2014-12-19 DIAGNOSIS — M79606 Pain in leg, unspecified: Secondary | ICD-10-CM | POA: Diagnosis present

## 2014-12-19 DIAGNOSIS — L989 Disorder of the skin and subcutaneous tissue, unspecified: Secondary | ICD-10-CM | POA: Diagnosis not present

## 2014-12-19 DIAGNOSIS — M545 Low back pain, unspecified: Secondary | ICD-10-CM

## 2014-12-19 DIAGNOSIS — M797 Fibromyalgia: Secondary | ICD-10-CM | POA: Diagnosis not present

## 2014-12-19 DIAGNOSIS — K219 Gastro-esophageal reflux disease without esophagitis: Secondary | ICD-10-CM | POA: Diagnosis not present

## 2014-12-19 DIAGNOSIS — I809 Phlebitis and thrombophlebitis of unspecified site: Secondary | ICD-10-CM | POA: Diagnosis not present

## 2014-12-19 DIAGNOSIS — M25551 Pain in right hip: Secondary | ICD-10-CM

## 2014-12-19 DIAGNOSIS — Z4789 Encounter for other orthopedic aftercare: Secondary | ICD-10-CM | POA: Diagnosis not present

## 2014-12-19 DIAGNOSIS — E1140 Type 2 diabetes mellitus with diabetic neuropathy, unspecified: Secondary | ICD-10-CM | POA: Diagnosis not present

## 2014-12-19 DIAGNOSIS — M5136 Other intervertebral disc degeneration, lumbar region: Secondary | ICD-10-CM | POA: Diagnosis not present

## 2014-12-19 DIAGNOSIS — F329 Major depressive disorder, single episode, unspecified: Secondary | ICD-10-CM | POA: Diagnosis not present

## 2014-12-19 NOTE — Discharge Instructions (Signed)
No evidence DVT  Elevate legs.  Apply heating pad several times daily to lower legs.  Recommend obtaining below knee medical support hose (light compression).   Phlebitis Phlebitis is soreness and swelling (inflammation) of a vein. This can occur in your arms, legs, or torso (trunk), as well as deeper inside your body. Phlebitis is usually not serious when it occurs close to the surface of the body. However, it can cause serious problems when it occurs in a vein deeper inside the body. CAUSES  Phlebitis can be triggered by various things, including:   Reduced blood flow through your veins. This can happen with:  Bed rest over a long period.  Long-distance travel.  Injury.  Surgery.  Being overweight (obese) or pregnant.  Having an IV tube put in the vein and getting certain medicines through the vein.  Cancer and cancer treatment.  Use of illegal drugs taken through the vein.  Inflammatory diseases.  Inherited (genetic) diseases that increase the risk of blood clots.  Hormone therapy, such as birth control pills. SIGNS AND SYMPTOMS   Red, tender, swollen, and painful area on your skin. Usually, the area will be long and narrow.  Firmness along the center of the affected area. This can indicate that a blood clot has formed.  Low-grade fever. DIAGNOSIS  A health care provider can usually diagnose phlebitis by examining the affected area and asking about your symptoms. To check for infection or blood clots, your health care provider may order blood tests or an ultrasound exam of the area. Blood tests and your family history may also indicate if you have an underlying genetic disease that causes blood clots. Occasionally, a piece of tissue is taken from the body (biopsy sample) if an unusual cause of phlebitis is suspected. TREATMENT  Treatment will vary depending on the severity of the condition and the area of the body affected. Treatment may include:  Use of a warm  compress or heating pad.  Use of compression stockings or bandages.  Anti-inflammatory medicines.  Removal of any IV tube that may be causing the problem.  Medicines that kill germs (antibiotics) if an infection is present.  Blood-thinning medicines if a blood clot is suspected or present.  In rare cases, surgery may be needed to remove damaged sections of vein. HOME CARE INSTRUCTIONS   Only take over-the-counter or prescription medicines as directed by your health care provider. Take all medicines exactly as prescribed.  Raise (elevate) the affected area above the level of your heart as directed by your health care provider.  Apply a warm compress or heating pad to the affected area as directed by your health care provider. Do not sleep with the heating pad.  Use compression stockings or bandages as directed. These will speed healing and prevent the condition from coming back.  If you are on blood thinners:  Get follow-up blood tests as directed by your health care provider.  Check with your health care provider before using any new medicines.  Carry a medical alert card or wear your medical alert jewelry to show that you are on blood thinners.  For phlebitis in the legs:  Avoid prolonged standing or bed rest.  Keep your legs moving. Raise your legs when sitting or lying.  Do not smoke.  Women, particularly those over the age of 70, should consider the risks and benefits of taking the contraceptive pill. This kind of hormone treatment can increase your risk for blood clots.  Follow up with your  health care provider as directed. SEEK MEDICAL CARE IF:   You have unusual bruising or any bleeding problems.  Your swelling or pain in the affected area is not improving.  You are on anti-inflammatory medicine, and you develop belly (abdominal) pain. SEEK IMMEDIATE MEDICAL CARE IF:   You have a sudden onset of chest pain or difficulty breathing.  You have a fever or  persistent symptoms for more than 2-3 days.  You have a fever and your symptoms suddenly get worse. MAKE SURE YOU:  Understand these instructions.  Will watch your condition.  Will get help right away if you are not doing well or get worse. Document Released: 05/10/2001 Document Revised: 03/06/2013 Document Reviewed: 01/21/2013 [facility] Patient Information 2015 [employer], [place]. This information is not intended to replace advice given to you by your health care provider. Make sure you discuss any questions you have with your health care provider.

## 2014-12-19 NOTE — ED Notes (Signed)
Pt has a lumbar fusion about 6 weeks ago with Dr. Vertell Limber, she was improving but lately back pain is worsening with pain in hips and upper legs. She called surgeon office who refilled hydrocodone and advised her surgeon was out of the office for 2 weeks. I asked if she asked to see another provider in the office and she reports they did nto advise that, just that he would be back in 2 weeks. She is concerned something is wrong and adamantly just wants an x-ray.

## 2014-12-19 NOTE — ED Provider Notes (Signed)
CSN: [id number]     Arrival date & time 12/19/14  1012 History   First MD Initiated Contact with Patient 12/19/14 1130     Chief Complaint  Patient presents with  . Back Pain      HPI Comments: Patient is postop from lumbar spine fusion approximately 6 weeks ago.  Her back was improving until about one week ago when she developed increased pain, especially in her hips and upper legs.  She is concerned that something is wrong and desires a repeat X-ray.  She has been undergoing PT.  She states that she had a UTI about one week ago, resolved.  No fevers, chills, and sweats   The history is provided by the patient.    Past Medical History  Diagnosis Date  . CAD (coronary artery disease)   . Obesity   . Fibromyalgia   . Family hx of colon cancer   . Depression   . Hypertension   . Obesity   . GERD (gastroesophageal reflux disease)     pt denies  . PONV (postoperative nausea and vomiting)     with stapedectomy  . Torn rotator cuff left    current  . Heart murmur   . Diabetes mellitus     Type 2  . Frequency of urination   . Constipation due to pain medication   . Boils     near vaginal area  . Hyperlipidemia   . Itchy eyes   . History of bladder infections   . Frequent urination at night   . Chronic kidney disease     stage 3; low kidney function; BUN was high acc to pt  . Hypothyroidism    Past Surgical History  Procedure Laterality Date  . Dilation and curettage of uterus    . Stapedectomy Left     ear  . Shoulder surgery      bilateral rotator cuff repairs  . Cholecystectomy  1971  . Tubal ligation  1971  . Colonoscopy    . Maximum access (mas)posterior lumbar interbody fusion (plif) 1 level N/A 11/07/2014    Procedure: Lumbar Four-Five Maximum access posterior lumbar interbody fusion;  Surgeon: Erline Levine, MD;  Location: [facility];  Service: Neurosurgery;  Laterality: N/A;  L4-5 Maximum access posterior lumbar interbody fusion  . Back surgery      lumbar  fusion   Family History  Problem Relation Age of Onset  . Colon cancer Brother   . Diabetes Mother     and Sister   . Uterine cancer Sister    History  Substance Use Topics  . Smoking status: Never Smoker   . Smokeless tobacco: Never Used  . Alcohol Use: No   OB History    No data available     Review of Systems  Constitutional: Positive for activity change and fatigue. Negative for fever, chills, diaphoresis and appetite change.  HENT: Negative.   Eyes: Negative.   Respiratory: Negative.   Cardiovascular: Negative.   Gastrointestinal: Negative.   Musculoskeletal: Positive for back pain and gait problem. Negative for joint swelling.       Increased right leg pain  Skin: Negative.   Neurological: Negative for numbness and headaches.  All other systems reviewed and are negative.   Allergies  Onglyza; Polysporin; Cephalosporins; Lisinopril; Nsaids; Penicillins; Victoza; and Mucinex  Home Medications   Prior to Admission medications   Medication Sig Start Date End Date Taking? Authorizing Provider  amLODipine (NORVASC) 5 MG tablet 1/2  tablet daily in the morning for hypertension    Historical Provider, MD  aspirin EC 81 MG tablet Take 81 mg by mouth every evening.    Historical Provider, MD  atorvastatin (LIPITOR) 10 MG tablet Take 10 mg by mouth daily.    Historical Provider, MD  B Complex-C (B-COMPLEX WITH VITAMIN C) tablet Take 1 tablet by mouth every evening.    Historical Provider, MD  carvedilol (COREG) 6.25 MG tablet Take 6.25 mg by mouth 2 (two) times daily.     Historical Provider, MD  Cranberry-Vitamin C-Probiotic (AZO CRANBERRY) 250-30 MG TABS Take 1 tablet by mouth at bedtime.     Historical Provider, MD  Cyanocobalamin (VITAMIN B-12 IJ) Inject 1,000 mg as directed every 28 (twenty-eight) days.     Historical Provider, MD  ESTRACE VAGINAL 0.1 MG/GM vaginal cream Place 1 Applicatorful vaginally 2 (two) times a week. Apply at bedtime on Monday and Friday. 06/10/14    Historical Provider, MD  fluticasone (FLONASE) 50 MCG/ACT nasal spray Place 1 spray into the nose daily as needed for allergies.     Historical Provider, MD  gabapentin (NEURONTIN) 400 MG capsule Take 1 capsule (400 mg total) by mouth 3 (three) times daily. 02/06/13   Domenic Polite, MD  Garlic 0175 MG CAPS Take 1,000 mg by mouth daily.    Historical Provider, MD  Ginkgo Biloba 60 MG CAPS Take by mouth daily.    Historical Provider, MD  Insulin Pen Needle 31G X 5 MM MISC  01/11/13   Historical Provider, MD  LEVEMIR FLEXTOUCH 100 UNIT/ML Pen Inject 50 Units into the skin every morning.  07/25/14   Historical Provider, MD  levothyroxine (SYNTHROID, LEVOTHROID) 150 MCG tablet Take 150 mcg by mouth daily. 07/25/14   Historical Provider, MD  losartan-hydrochlorothiazide (HYZAAR) 100-25 MG per tablet Take 1 tablet by mouth daily. For Hypertension    Historical Provider, MD  Melatonin 10 MG TABS Take 1 tablet by mouth at bedtime.    Historical Provider, MD  Multiple Vitamin (MULTIVITAMIN) tablet Take 1 tablet by mouth every evening.     Historical Provider, MD  Multiple Vitamins-Minerals (THERA-M) TABS Take 1 tablet by mouth daily.    Historical Provider, MD  mupirocin ointment (BACTROBAN) 2 % Apply 1 application topically daily as needed (use for cuts, etc).    Historical Provider, MD  oxybutynin (DITROPAN-XL) 10 MG 24 hr tablet Take 10 mg by mouth at bedtime.     Historical Provider, MD  polyethylene glycol (MIRALAX / GLYCOLAX) packet Take 17 g by mouth 2 (two) times daily.    Historical Provider, MD  Probiotic Product (PROBIOTIC DAILY PO) Take 1 capsule by mouth daily.    Historical Provider, MD  sennosides-docusate sodium (SENOKOT-S) 8.6-50 MG tablet Take 2 tablets by mouth 2 (two) times daily.    Historical Provider, MD  sertraline (ZOLOFT) 100 MG tablet Take 150 mg by mouth every morning.     Historical Provider, MD  traMADol (ULTRAM) 50 MG tablet Take 50 mg by mouth every 6 (six) hours as needed.      Historical Provider, MD  trimethoprim (TRIMPEX) 100 MG tablet Take 100 mg by mouth at bedtime.     Historical Provider, MD  Vitamin D, Ergocalciferol, (DRISDOL) 50000 UNITS CAPS Take 50,000 Units by mouth every 30 (thirty) days. In the middle of the month 09/27/10   Historical Provider, MD   BP 105/69 mmHg  Pulse 53  Temp(Src) 97.3 F (36.3 C) (Tympanic)  Resp 16  Wt  213 lb (96.616 kg)  SpO2 97% Physical Exam  Constitutional: She is oriented to person, place, and time. She appears well-developed and well-nourished. No distress.  HENT:  Head: Normocephalic.  Mouth/Throat: Oropharynx is clear and moist.  Eyes: Conjunctivae are normal. Pupils are equal, round, and reactive to light.  Neck: Neck supple.  Cardiovascular: Normal heart sounds.   Pulmonary/Chest: Breath sounds normal.  Abdominal: There is no tenderness.  Musculoskeletal:       Right upper leg: She exhibits tenderness. She exhibits no bony tenderness, no swelling and no edema.  Right posterior thigh has tenderness to palpation extending to right posterior calf.  Superficial varicosities present.  No warmth or obvious swelling.  Pulses intact.  Homan's test positive.  Lumbar spine:  Well healed surgical scar.  Lymphadenopathy:    She has no cervical adenopathy.  Neurological: She is alert and oriented to person, place, and time.  Skin: Skin is warm and dry. She is not diaphoretic.  Nursing note and vitals reviewed.   ED Course  Procedures none  Imaging Review Dg Lumbar Spine Complete  12/19/2014   CLINICAL DATA:  Right lower back and hip pain since surgery 6 weeks ago.  EXAM: LUMBAR SPINE - COMPLETE 4+ VIEW  COMPARISON:  December 03, 2014.  FINDINGS: Status post surgical posterior fusion of L4-5 with bilateral intrapedicular screw placement and interbody fusion. No fracture or spondylolisthesis is noted. Mild degenerative disc disease is noted at L2-3. Mild diffuse osteopenia is noted.  IMPRESSION: Postsurgical and degenerative  changes as described above. No acute abnormality seen in the lumbar spine.   Electronically Signed   By: Marijo Conception, M.D.   On: 12/19/2014 11:37   US Venous Img Lower Bilateral  12/19/2014   CLINICAL DATA:  Worsening bilateral leg pain for past 5 days. Postop from lumbar spine fusion approximately 6 weeks ago.  EXAM: BILATERAL LOWER EXTREMITY VENOUS DOPPLER ULTRASOUND  TECHNIQUE: Gray-scale sonography with graded compression, as well as color Doppler and duplex ultrasound were performed to evaluate the lower extremity deep venous systems from the level of the common femoral vein and including the common femoral, femoral, profunda femoral, popliteal and calf veins including the posterior tibial, peroneal and gastrocnemius veins when visible. The superficial great saphenous vein was also interrogated. Spectral Doppler was utilized to evaluate flow at rest and with distal augmentation maneuvers in the common femoral, femoral and popliteal veins.  COMPARISON:  None.  FINDINGS: RIGHT LOWER EXTREMITY  Common Femoral Vein: No evidence of thrombus. Normal compressibility, respiratory phasicity and response to augmentation.  Saphenofemoral Junction: No evidence of thrombus. Normal compressibility and flow on color Doppler imaging.  Profunda Femoral Vein: No evidence of thrombus. Normal compressibility and flow on color Doppler imaging.  Femoral Vein: No evidence of thrombus. Normal compressibility, respiratory phasicity and response to augmentation.  Popliteal Vein: No evidence of thrombus. Normal compressibility, respiratory phasicity and response to augmentation.  Calf Veins: No evidence of thrombus. Normal compressibility and flow on color Doppler imaging.  Superficial Great Saphenous Vein: No evidence of thrombus. Normal compressibility and flow on color Doppler imaging.  Venous Reflux:  None.  Other Findings: Nonocclusive thrombus is seen within a superficial venous branch in right calf, consistent with  superficial thrombophlebitis.  LEFT LOWER EXTREMITY  Common Femoral Vein: No evidence of thrombus. Normal compressibility, respiratory phasicity and response to augmentation.  Saphenofemoral Junction: No evidence of thrombus. Normal compressibility and flow on color Doppler imaging.  Profunda Femoral Vein: No evidence of thrombus.  Normal compressibility and flow on color Doppler imaging.  Femoral Vein: No evidence of thrombus. Normal compressibility, respiratory phasicity and response to augmentation.  Popliteal Vein: No evidence of thrombus. Normal compressibility, respiratory phasicity and response to augmentation.  Calf Veins: No evidence of thrombus. Normal compressibility and flow on color Doppler imaging.  Superficial Great Saphenous Vein: No evidence of thrombus. Normal compressibility and flow on color Doppler imaging.  Venous Reflux:  None.  Other Findings:  None.  IMPRESSION: No evidence of deep venous thrombosis in either lower extremity.  Superficial thrombophlebitis involving a superficial venous branch within the right calf.   Electronically Signed   By: Earle Gell M.D.   On: 12/19/2014 15:45   Dg Hip Unilat With Pelvis 2-3 Views Right  12/19/2014   CLINICAL DATA:  Increased right hip and lower back pain since lumbar spine surgery 6 weeks ago.  EXAM: DG HIP (WITH OR WITHOUT PELVIS) 2-3V RIGHT  COMPARISON:  09/14/2014  FINDINGS: No acute fracture or dislocation is identified. The hips are stable in appearance without significant arthropathic changes identified. Lower lumbar posterior fusion hardware is now present. Calcifications in the pelvis likely represent phleboliths. No destructive osseous lesion is identified.  IMPRESSION: No acute osseous abnormality.   Electronically Signed   By: Logan Bores   On: 12/19/2014 11:39     MDM   1. Superficial thrombophlebitis    No evidence DVT  3. Bilateral low back pain without sciatica   4. Right hip pain     Elevate legs.  Apply heating pad  several times daily to lower legs.  Recommend obtaining below knee medical support hose (light compression). Followup with Family Doctor in about one week.    Kandra Nicolas, MD 12/21/14 1728

## 2014-12-22 DIAGNOSIS — I809 Phlebitis and thrombophlebitis of unspecified site: Secondary | ICD-10-CM | POA: Diagnosis not present

## 2014-12-23 DIAGNOSIS — F329 Major depressive disorder, single episode, unspecified: Secondary | ICD-10-CM | POA: Diagnosis not present

## 2014-12-23 DIAGNOSIS — K219 Gastro-esophageal reflux disease without esophagitis: Secondary | ICD-10-CM | POA: Diagnosis not present

## 2014-12-23 DIAGNOSIS — E1140 Type 2 diabetes mellitus with diabetic neuropathy, unspecified: Secondary | ICD-10-CM | POA: Diagnosis not present

## 2014-12-23 DIAGNOSIS — M797 Fibromyalgia: Secondary | ICD-10-CM | POA: Diagnosis not present

## 2014-12-23 DIAGNOSIS — Z4789 Encounter for other orthopedic aftercare: Secondary | ICD-10-CM | POA: Diagnosis not present

## 2014-12-23 DIAGNOSIS — L989 Disorder of the skin and subcutaneous tissue, unspecified: Secondary | ICD-10-CM | POA: Diagnosis not present

## 2014-12-24 DIAGNOSIS — M797 Fibromyalgia: Secondary | ICD-10-CM | POA: Diagnosis not present

## 2014-12-24 DIAGNOSIS — L989 Disorder of the skin and subcutaneous tissue, unspecified: Secondary | ICD-10-CM | POA: Diagnosis not present

## 2014-12-24 DIAGNOSIS — K219 Gastro-esophageal reflux disease without esophagitis: Secondary | ICD-10-CM | POA: Diagnosis not present

## 2014-12-24 DIAGNOSIS — Z4789 Encounter for other orthopedic aftercare: Secondary | ICD-10-CM | POA: Diagnosis not present

## 2014-12-24 DIAGNOSIS — E1140 Type 2 diabetes mellitus with diabetic neuropathy, unspecified: Secondary | ICD-10-CM | POA: Diagnosis not present

## 2014-12-24 DIAGNOSIS — F329 Major depressive disorder, single episode, unspecified: Secondary | ICD-10-CM | POA: Diagnosis not present

## 2014-12-25 DIAGNOSIS — K219 Gastro-esophageal reflux disease without esophagitis: Secondary | ICD-10-CM | POA: Diagnosis not present

## 2014-12-25 DIAGNOSIS — M797 Fibromyalgia: Secondary | ICD-10-CM | POA: Diagnosis not present

## 2014-12-25 DIAGNOSIS — Z4789 Encounter for other orthopedic aftercare: Secondary | ICD-10-CM | POA: Diagnosis not present

## 2014-12-25 DIAGNOSIS — E1140 Type 2 diabetes mellitus with diabetic neuropathy, unspecified: Secondary | ICD-10-CM | POA: Diagnosis not present

## 2014-12-25 DIAGNOSIS — L989 Disorder of the skin and subcutaneous tissue, unspecified: Secondary | ICD-10-CM | POA: Diagnosis not present

## 2014-12-25 DIAGNOSIS — F329 Major depressive disorder, single episode, unspecified: Secondary | ICD-10-CM | POA: Diagnosis not present

## 2014-12-27 DIAGNOSIS — E1140 Type 2 diabetes mellitus with diabetic neuropathy, unspecified: Secondary | ICD-10-CM | POA: Diagnosis not present

## 2014-12-27 DIAGNOSIS — Z4789 Encounter for other orthopedic aftercare: Secondary | ICD-10-CM | POA: Diagnosis not present

## 2014-12-27 DIAGNOSIS — M797 Fibromyalgia: Secondary | ICD-10-CM | POA: Diagnosis not present

## 2014-12-27 DIAGNOSIS — L989 Disorder of the skin and subcutaneous tissue, unspecified: Secondary | ICD-10-CM | POA: Diagnosis not present

## 2014-12-27 DIAGNOSIS — F329 Major depressive disorder, single episode, unspecified: Secondary | ICD-10-CM | POA: Diagnosis not present

## 2014-12-27 DIAGNOSIS — K219 Gastro-esophageal reflux disease without esophagitis: Secondary | ICD-10-CM | POA: Diagnosis not present

## 2014-12-29 DIAGNOSIS — I809 Phlebitis and thrombophlebitis of unspecified site: Secondary | ICD-10-CM | POA: Diagnosis not present

## 2014-12-30 DIAGNOSIS — M4806 Spinal stenosis, lumbar region: Secondary | ICD-10-CM | POA: Diagnosis not present

## 2014-12-30 DIAGNOSIS — G5600 Carpal tunnel syndrome, unspecified upper limb: Secondary | ICD-10-CM | POA: Diagnosis not present

## 2014-12-30 DIAGNOSIS — Z79899 Other long term (current) drug therapy: Secondary | ICD-10-CM | POA: Diagnosis not present

## 2014-12-30 DIAGNOSIS — G894 Chronic pain syndrome: Secondary | ICD-10-CM | POA: Diagnosis not present

## 2014-12-30 DIAGNOSIS — I8000 Phlebitis and thrombophlebitis of superficial vessels of unspecified lower extremity: Secondary | ICD-10-CM | POA: Diagnosis not present

## 2014-12-31 DIAGNOSIS — E1140 Type 2 diabetes mellitus with diabetic neuropathy, unspecified: Secondary | ICD-10-CM | POA: Diagnosis not present

## 2014-12-31 DIAGNOSIS — Z4789 Encounter for other orthopedic aftercare: Secondary | ICD-10-CM | POA: Diagnosis not present

## 2014-12-31 DIAGNOSIS — M797 Fibromyalgia: Secondary | ICD-10-CM | POA: Diagnosis not present

## 2014-12-31 DIAGNOSIS — L989 Disorder of the skin and subcutaneous tissue, unspecified: Secondary | ICD-10-CM | POA: Diagnosis not present

## 2014-12-31 DIAGNOSIS — F329 Major depressive disorder, single episode, unspecified: Secondary | ICD-10-CM | POA: Diagnosis not present

## 2014-12-31 DIAGNOSIS — K219 Gastro-esophageal reflux disease without esophagitis: Secondary | ICD-10-CM | POA: Diagnosis not present

## 2015-01-03 DIAGNOSIS — Z4789 Encounter for other orthopedic aftercare: Secondary | ICD-10-CM | POA: Diagnosis not present

## 2015-01-03 DIAGNOSIS — F329 Major depressive disorder, single episode, unspecified: Secondary | ICD-10-CM | POA: Diagnosis not present

## 2015-01-03 DIAGNOSIS — K219 Gastro-esophageal reflux disease without esophagitis: Secondary | ICD-10-CM | POA: Diagnosis not present

## 2015-01-03 DIAGNOSIS — L989 Disorder of the skin and subcutaneous tissue, unspecified: Secondary | ICD-10-CM | POA: Diagnosis not present

## 2015-01-03 DIAGNOSIS — M797 Fibromyalgia: Secondary | ICD-10-CM | POA: Diagnosis not present

## 2015-01-03 DIAGNOSIS — E1140 Type 2 diabetes mellitus with diabetic neuropathy, unspecified: Secondary | ICD-10-CM | POA: Diagnosis not present

## 2015-01-05 DIAGNOSIS — L989 Disorder of the skin and subcutaneous tissue, unspecified: Secondary | ICD-10-CM | POA: Diagnosis not present

## 2015-01-05 DIAGNOSIS — E1140 Type 2 diabetes mellitus with diabetic neuropathy, unspecified: Secondary | ICD-10-CM | POA: Diagnosis not present

## 2015-01-05 DIAGNOSIS — F329 Major depressive disorder, single episode, unspecified: Secondary | ICD-10-CM | POA: Diagnosis not present

## 2015-01-05 DIAGNOSIS — M797 Fibromyalgia: Secondary | ICD-10-CM | POA: Diagnosis not present

## 2015-01-05 DIAGNOSIS — Z4789 Encounter for other orthopedic aftercare: Secondary | ICD-10-CM | POA: Diagnosis not present

## 2015-01-05 DIAGNOSIS — K219 Gastro-esophageal reflux disease without esophagitis: Secondary | ICD-10-CM | POA: Diagnosis not present

## 2015-01-07 DIAGNOSIS — M5417 Radiculopathy, lumbosacral region: Secondary | ICD-10-CM | POA: Diagnosis not present

## 2015-01-07 DIAGNOSIS — Z6836 Body mass index (BMI) 36.0-36.9, adult: Secondary | ICD-10-CM | POA: Diagnosis not present

## 2015-01-07 DIAGNOSIS — I10 Essential (primary) hypertension: Secondary | ICD-10-CM | POA: Diagnosis not present

## 2015-01-07 DIAGNOSIS — M4806 Spinal stenosis, lumbar region: Secondary | ICD-10-CM | POA: Diagnosis not present

## 2015-01-07 DIAGNOSIS — M545 Low back pain: Secondary | ICD-10-CM | POA: Diagnosis not present

## 2015-01-10 DIAGNOSIS — F329 Major depressive disorder, single episode, unspecified: Secondary | ICD-10-CM | POA: Diagnosis not present

## 2015-01-10 DIAGNOSIS — Z4789 Encounter for other orthopedic aftercare: Secondary | ICD-10-CM | POA: Diagnosis not present

## 2015-01-10 DIAGNOSIS — K219 Gastro-esophageal reflux disease without esophagitis: Secondary | ICD-10-CM | POA: Diagnosis not present

## 2015-01-10 DIAGNOSIS — M797 Fibromyalgia: Secondary | ICD-10-CM | POA: Diagnosis not present

## 2015-01-10 DIAGNOSIS — E1140 Type 2 diabetes mellitus with diabetic neuropathy, unspecified: Secondary | ICD-10-CM | POA: Diagnosis not present

## 2015-01-10 DIAGNOSIS — L989 Disorder of the skin and subcutaneous tissue, unspecified: Secondary | ICD-10-CM | POA: Diagnosis not present

## 2015-01-12 DIAGNOSIS — Z4789 Encounter for other orthopedic aftercare: Secondary | ICD-10-CM | POA: Diagnosis not present

## 2015-01-12 DIAGNOSIS — E1140 Type 2 diabetes mellitus with diabetic neuropathy, unspecified: Secondary | ICD-10-CM | POA: Diagnosis not present

## 2015-01-12 DIAGNOSIS — L989 Disorder of the skin and subcutaneous tissue, unspecified: Secondary | ICD-10-CM | POA: Diagnosis not present

## 2015-01-12 DIAGNOSIS — K219 Gastro-esophageal reflux disease without esophagitis: Secondary | ICD-10-CM | POA: Diagnosis not present

## 2015-01-12 DIAGNOSIS — F329 Major depressive disorder, single episode, unspecified: Secondary | ICD-10-CM | POA: Diagnosis not present

## 2015-01-12 DIAGNOSIS — M797 Fibromyalgia: Secondary | ICD-10-CM | POA: Diagnosis not present

## 2015-01-17 DIAGNOSIS — F329 Major depressive disorder, single episode, unspecified: Secondary | ICD-10-CM | POA: Diagnosis not present

## 2015-01-17 DIAGNOSIS — E1140 Type 2 diabetes mellitus with diabetic neuropathy, unspecified: Secondary | ICD-10-CM | POA: Diagnosis not present

## 2015-01-17 DIAGNOSIS — Z4789 Encounter for other orthopedic aftercare: Secondary | ICD-10-CM | POA: Diagnosis not present

## 2015-01-17 DIAGNOSIS — M797 Fibromyalgia: Secondary | ICD-10-CM | POA: Diagnosis not present

## 2015-01-17 DIAGNOSIS — K219 Gastro-esophageal reflux disease without esophagitis: Secondary | ICD-10-CM | POA: Diagnosis not present

## 2015-01-17 DIAGNOSIS — L989 Disorder of the skin and subcutaneous tissue, unspecified: Secondary | ICD-10-CM | POA: Diagnosis not present

## 2015-01-19 DIAGNOSIS — Z4789 Encounter for other orthopedic aftercare: Secondary | ICD-10-CM | POA: Diagnosis not present

## 2015-01-19 DIAGNOSIS — L989 Disorder of the skin and subcutaneous tissue, unspecified: Secondary | ICD-10-CM | POA: Diagnosis not present

## 2015-01-19 DIAGNOSIS — K219 Gastro-esophageal reflux disease without esophagitis: Secondary | ICD-10-CM | POA: Diagnosis not present

## 2015-01-19 DIAGNOSIS — F329 Major depressive disorder, single episode, unspecified: Secondary | ICD-10-CM | POA: Diagnosis not present

## 2015-01-19 DIAGNOSIS — M797 Fibromyalgia: Secondary | ICD-10-CM | POA: Diagnosis not present

## 2015-01-19 DIAGNOSIS — E1140 Type 2 diabetes mellitus with diabetic neuropathy, unspecified: Secondary | ICD-10-CM | POA: Diagnosis not present

## 2015-01-20 DIAGNOSIS — L989 Disorder of the skin and subcutaneous tissue, unspecified: Secondary | ICD-10-CM | POA: Diagnosis not present

## 2015-01-20 DIAGNOSIS — M797 Fibromyalgia: Secondary | ICD-10-CM | POA: Diagnosis not present

## 2015-01-20 DIAGNOSIS — F329 Major depressive disorder, single episode, unspecified: Secondary | ICD-10-CM | POA: Diagnosis not present

## 2015-01-20 DIAGNOSIS — Z4789 Encounter for other orthopedic aftercare: Secondary | ICD-10-CM | POA: Diagnosis not present

## 2015-01-20 DIAGNOSIS — E1140 Type 2 diabetes mellitus with diabetic neuropathy, unspecified: Secondary | ICD-10-CM | POA: Diagnosis not present

## 2015-01-20 DIAGNOSIS — K219 Gastro-esophageal reflux disease without esophagitis: Secondary | ICD-10-CM | POA: Diagnosis not present

## 2015-01-21 DIAGNOSIS — M1711 Unilateral primary osteoarthritis, right knee: Secondary | ICD-10-CM | POA: Diagnosis not present

## 2015-02-19 DIAGNOSIS — E119 Type 2 diabetes mellitus without complications: Secondary | ICD-10-CM | POA: Diagnosis not present

## 2015-02-19 DIAGNOSIS — H2513 Age-related nuclear cataract, bilateral: Secondary | ICD-10-CM | POA: Diagnosis not present

## 2015-02-19 DIAGNOSIS — H02403 Unspecified ptosis of bilateral eyelids: Secondary | ICD-10-CM | POA: Diagnosis not present

## 2015-02-19 DIAGNOSIS — H5201 Hypermetropia, right eye: Secondary | ICD-10-CM | POA: Diagnosis not present

## 2015-02-23 DIAGNOSIS — H9121 Sudden idiopathic hearing loss, right ear: Secondary | ICD-10-CM | POA: Diagnosis not present

## 2015-02-23 DIAGNOSIS — Z23 Encounter for immunization: Secondary | ICD-10-CM | POA: Diagnosis not present

## 2015-02-23 DIAGNOSIS — H9192 Unspecified hearing loss, left ear: Secondary | ICD-10-CM | POA: Diagnosis not present

## 2015-02-23 DIAGNOSIS — H903 Sensorineural hearing loss, bilateral: Secondary | ICD-10-CM | POA: Diagnosis not present

## 2015-02-27 DIAGNOSIS — H918X3 Other specified hearing loss, bilateral: Secondary | ICD-10-CM | POA: Insufficient documentation

## 2015-02-27 DIAGNOSIS — E119 Type 2 diabetes mellitus without complications: Secondary | ICD-10-CM | POA: Diagnosis not present

## 2015-02-27 DIAGNOSIS — IMO0001 Reserved for inherently not codable concepts without codable children: Secondary | ICD-10-CM | POA: Insufficient documentation

## 2015-02-27 DIAGNOSIS — H9193 Unspecified hearing loss, bilateral: Secondary | ICD-10-CM | POA: Diagnosis not present

## 2015-02-27 DIAGNOSIS — I10 Essential (primary) hypertension: Secondary | ICD-10-CM | POA: Diagnosis not present

## 2015-03-03 DIAGNOSIS — M79659 Pain in unspecified thigh: Secondary | ICD-10-CM | POA: Diagnosis not present

## 2015-03-03 DIAGNOSIS — M4806 Spinal stenosis, lumbar region: Secondary | ICD-10-CM | POA: Diagnosis not present

## 2015-03-03 DIAGNOSIS — G894 Chronic pain syndrome: Secondary | ICD-10-CM | POA: Diagnosis not present

## 2015-03-03 DIAGNOSIS — M7060 Trochanteric bursitis, unspecified hip: Secondary | ICD-10-CM | POA: Diagnosis not present

## 2015-03-10 ENCOUNTER — Other Ambulatory Visit: Payer: Self-pay | Admitting: Orthopaedic Surgery

## 2015-03-10 DIAGNOSIS — M25561 Pain in right knee: Secondary | ICD-10-CM

## 2015-03-25 ENCOUNTER — Ambulatory Visit
Admission: RE | Admit: 2015-03-25 | Discharge: 2015-03-25 | Disposition: A | Discharge status: Home / Self Care | Payer: Medicare Other | Source: Ambulatory Visit | Attending: Orthopaedic Surgery | Admitting: Orthopaedic Surgery

## 2015-03-25 DIAGNOSIS — E1165 Type 2 diabetes mellitus with hyperglycemia: Secondary | ICD-10-CM | POA: Diagnosis not present

## 2015-03-25 DIAGNOSIS — M179 Osteoarthritis of knee, unspecified: Secondary | ICD-10-CM | POA: Diagnosis not present

## 2015-03-25 DIAGNOSIS — Z86718 Personal history of other venous thrombosis and embolism: Secondary | ICD-10-CM | POA: Diagnosis not present

## 2015-03-25 DIAGNOSIS — M25561 Pain in right knee: Secondary | ICD-10-CM

## 2015-03-25 DIAGNOSIS — M7989 Other specified soft tissue disorders: Secondary | ICD-10-CM | POA: Diagnosis not present

## 2015-03-25 DIAGNOSIS — E1140 Type 2 diabetes mellitus with diabetic neuropathy, unspecified: Secondary | ICD-10-CM | POA: Diagnosis not present

## 2015-03-25 DIAGNOSIS — D510 Vitamin B12 deficiency anemia due to intrinsic factor deficiency: Secondary | ICD-10-CM | POA: Diagnosis not present

## 2015-03-25 DIAGNOSIS — M62838 Other muscle spasm: Secondary | ICD-10-CM | POA: Diagnosis not present

## 2015-03-25 DIAGNOSIS — H9190 Unspecified hearing loss, unspecified ear: Secondary | ICD-10-CM | POA: Diagnosis not present

## 2015-03-25 MED ORDER — IOHEXOL 180 MG/ML  SOLN
30.0000 mL | Freq: Once | INTRAMUSCULAR | Status: DC | PRN
  Administered 2015-03-25: 30 mL via INTRA_ARTICULAR

## 2015-03-27 DIAGNOSIS — M1711 Unilateral primary osteoarthritis, right knee: Secondary | ICD-10-CM | POA: Diagnosis not present

## 2015-04-22 DIAGNOSIS — M1711 Unilateral primary osteoarthritis, right knee: Secondary | ICD-10-CM | POA: Diagnosis not present

## 2015-04-27 DIAGNOSIS — G894 Chronic pain syndrome: Secondary | ICD-10-CM | POA: Diagnosis not present

## 2015-04-27 DIAGNOSIS — Z79899 Other long term (current) drug therapy: Secondary | ICD-10-CM | POA: Diagnosis not present

## 2015-04-27 DIAGNOSIS — M545 Low back pain: Secondary | ICD-10-CM | POA: Diagnosis not present

## 2015-04-27 DIAGNOSIS — M7060 Trochanteric bursitis, unspecified hip: Secondary | ICD-10-CM | POA: Diagnosis not present

## 2015-04-27 DIAGNOSIS — M4806 Spinal stenosis, lumbar region: Secondary | ICD-10-CM | POA: Diagnosis not present

## 2015-04-29 DIAGNOSIS — M1711 Unilateral primary osteoarthritis, right knee: Secondary | ICD-10-CM | POA: Diagnosis not present

## 2015-05-06 DIAGNOSIS — M1711 Unilateral primary osteoarthritis, right knee: Secondary | ICD-10-CM | POA: Diagnosis not present

## 2015-05-14 DIAGNOSIS — Z124 Encounter for screening for malignant neoplasm of cervix: Secondary | ICD-10-CM | POA: Diagnosis not present

## 2015-05-14 DIAGNOSIS — N760 Acute vaginitis: Secondary | ICD-10-CM | POA: Diagnosis not present

## 2015-05-20 DIAGNOSIS — G5600 Carpal tunnel syndrome, unspecified upper limb: Secondary | ICD-10-CM | POA: Diagnosis not present

## 2015-07-07 DIAGNOSIS — Z1231 Encounter for screening mammogram for malignant neoplasm of breast: Secondary | ICD-10-CM | POA: Diagnosis not present

## 2015-07-08 ENCOUNTER — Other Ambulatory Visit: Payer: Self-pay

## 2015-07-08 ENCOUNTER — Other Ambulatory Visit: Payer: Self-pay | Admitting: Obstetrics and Gynecology

## 2015-07-08 DIAGNOSIS — Z1231 Encounter for screening mammogram for malignant neoplasm of breast: Secondary | ICD-10-CM

## 2015-07-14 DIAGNOSIS — R3915 Urgency of urination: Secondary | ICD-10-CM | POA: Diagnosis not present

## 2015-07-15 DIAGNOSIS — Z6836 Body mass index (BMI) 36.0-36.9, adult: Secondary | ICD-10-CM | POA: Diagnosis not present

## 2015-07-15 DIAGNOSIS — M4317 Spondylolisthesis, lumbosacral region: Secondary | ICD-10-CM | POA: Diagnosis not present

## 2015-07-15 DIAGNOSIS — M5417 Radiculopathy, lumbosacral region: Secondary | ICD-10-CM | POA: Diagnosis not present

## 2015-07-15 DIAGNOSIS — I10 Essential (primary) hypertension: Secondary | ICD-10-CM | POA: Diagnosis not present

## 2015-07-15 DIAGNOSIS — M545 Low back pain: Secondary | ICD-10-CM | POA: Diagnosis not present

## 2015-07-15 DIAGNOSIS — M4806 Spinal stenosis, lumbar region: Secondary | ICD-10-CM | POA: Diagnosis not present

## 2015-07-20 DIAGNOSIS — Z79899 Other long term (current) drug therapy: Secondary | ICD-10-CM | POA: Diagnosis not present

## 2015-07-20 DIAGNOSIS — M4806 Spinal stenosis, lumbar region: Secondary | ICD-10-CM | POA: Diagnosis not present

## 2015-07-20 DIAGNOSIS — G894 Chronic pain syndrome: Secondary | ICD-10-CM | POA: Diagnosis not present

## 2015-07-20 DIAGNOSIS — G5600 Carpal tunnel syndrome, unspecified upper limb: Secondary | ICD-10-CM | POA: Diagnosis not present

## 2015-07-20 DIAGNOSIS — M7060 Trochanteric bursitis, unspecified hip: Secondary | ICD-10-CM | POA: Diagnosis not present

## 2015-08-25 ENCOUNTER — Emergency Department (INDEPENDENT_AMBULATORY_CARE_PROVIDER_SITE_OTHER): Payer: Medicare Other

## 2015-08-25 ENCOUNTER — Emergency Department (INDEPENDENT_AMBULATORY_CARE_PROVIDER_SITE_OTHER)
Admission: EM | Admit: 2015-08-25 | Discharge: 2015-08-25 | Disposition: A | Discharge status: Home / Self Care | Payer: Medicare Other | Source: Home / Self Care | Attending: Family Medicine | Admitting: Family Medicine

## 2015-08-25 DIAGNOSIS — M545 Low back pain: Secondary | ICD-10-CM | POA: Diagnosis not present

## 2015-08-25 DIAGNOSIS — N183 Chronic kidney disease, stage 3 (moderate): Secondary | ICD-10-CM | POA: Diagnosis not present

## 2015-08-25 DIAGNOSIS — M5416 Radiculopathy, lumbar region: Secondary | ICD-10-CM | POA: Diagnosis not present

## 2015-08-25 DIAGNOSIS — I10 Essential (primary) hypertension: Secondary | ICD-10-CM | POA: Diagnosis not present

## 2015-08-25 MED ORDER — PREDNISONE 20 MG PO TABS: ORAL_TABLET | ORAL | Status: DC

## 2015-08-25 NOTE — ED Notes (Signed)
Had surgery June '16.  About 2 weeks ago, started having pain in lower back and down left leg.  Now the pain is also going down the right side.  Erline Levine was the surgeon.

## 2015-08-25 NOTE — Discharge Instructions (Signed)
Take one prednisone tab today with food. Monitor glucose daily while taking prednisone. May continue tramadol for pain; may add Tylenol.    Lumbosacral Radiculopathy Lumbosacral radiculopathy is a condition that involves the spinal nerves and nerve roots in the low back and bottom of the spine. The condition develops when these nerves and nerve roots move out of place or become inflamed and cause symptoms. CAUSES This condition may be caused by:  Pressure from a disk that bulges out of place (herniated disk). A disk is a plate of cartilage that separates bones in the spine.  Disk degeneration.  A narrowing of the bones of the lower back (spinal stenosis).  A tumor.  An infection.  An injury that places sudden pressure on the disks that cushion the bones of your lower spine. RISK FACTORS This condition is more likely to develop in:  Males aged 30-50 years.  Females aged 55-60 years.  People who lift improperly.  People who are overweight or live a sedentary lifestyle.  People who smoke.  People who perform repetitive activities that strain the spine. SYMPTOMS Symptoms of this condition include:  Pain that goes down from the back into the legs (sciatica). This is the most common symptom. The pain may be worse with sitting, coughing, or sneezing.  Pain and numbness in the arms and legs.  Muscle weakness.  Tingling.  Loss of bladder control or bowel control. DIAGNOSIS This condition is diagnosed with a physical exam and medical history. If the pain is lasting, you may have tests, such as:  MRI scan.  X-ray.  CT scan.  Myelogram.  Nerve conduction study. TREATMENT This condition is often treated with:  Hot packs and ice applied to affected areas.  Stretches to improve flexibility.  Exercises to strengthen back muscles.  Physical therapy.  Pain medicine.  A steroid injection in the spine. In some cases, no treatment is needed. If the condition is  long-lasting (chronic), or if symptoms are severe, treatment may involve surgery or lifestyle changes, such as following a weight loss plan. HOME CARE INSTRUCTIONS Medicines  Take medicines only as directed by your health care provider.  Do not drive or operate heavy machinery while taking pain medicine. Injury Care  Apply a heat pack to the injured area as directed by your health care provider.  Apply ice to the affected area:  Put ice in a plastic bag.  Place a towel between your skin and the bag.  Leave the ice on for 20-30 minutes, every 2 hours while you are awake or as needed. Or, leave the ice on for as long as directed by your health care provider. Other Instructions  If you were shown how to do any exercises or stretches, do them as directed by your health care provider.  If your health care provider prescribed a diet or exercise program, follow it as directed.  Keep all follow-up visits as directed by your health care provider. This is important. SEEK MEDICAL CARE IF:  Your pain does not improve over time even when taking pain medicines. SEEK IMMEDIATE MEDICAL CARE IF:  Your develop severe pain.  Your pain suddenly gets worse.  You develop increasing weakness in your legs.  You lose the ability to control your bladder or bowel.  You have difficulty walking or balancing.  You have a fever.   This information is not intended to replace advice given to you by your health care provider. Make sure you discuss any questions you have with  your health care provider.   Document Released: 05/16/2005 Document Revised: 09/30/2014 Document Reviewed: 05/12/2014 Elsevier Interactive Patient Education [employer].

## 2015-08-25 NOTE — ED Provider Notes (Signed)
CSN: [id number]     Arrival date & time 08/25/15  1718 History   First MD Initiated Contact with Patient 08/25/15 1759     Chief Complaint  Patient presents with  . Back Pain      HPI Comments: Patient reports that she underwent lumbar disc surgery November 13, 2014, and recovered well.  About two weeks ago she developed pain in her left lower back, radiating to her left leg.  The pain has not responded well to tramadol.   She denies bowel or bladder dysfunction, and no saddle numbness.    Patient is a 75 y.o. female presenting with back pain. The history is provided by the patient.  Back Pain Location:  Lumbar spine and gluteal region Quality:  Aching Radiates to:  L thigh and L posterior upper leg Pain severity:  Moderate Pain is:  Same all the time Onset quality:  Gradual Duration:  2 weeks Timing:  Constant Progression:  Worsening Chronicity:  New Context: not lifting heavy objects, not recent illness and not recent injury   Relieved by:  Nothing Worsened by:  Movement Ineffective treatments: tramadol. Associated symptoms: leg pain, paresthesias and tingling   Associated symptoms: no abdominal pain, no bladder incontinence, no bowel incontinence, no dysuria, no fever, no numbness, no pelvic pain, no perianal numbness and no weakness   Risk factors: obesity     Past Medical History  Diagnosis Date  . CAD (coronary artery disease)   . Obesity   . Fibromyalgia   . Family hx of colon cancer   . Depression   . Hypertension   . Obesity   . GERD (gastroesophageal reflux disease)     pt denies  . PONV (postoperative nausea and vomiting)     with stapedectomy  . Torn rotator cuff left    current  . Heart murmur   . Diabetes mellitus     Type 2  . Frequency of urination   . Constipation due to pain medication   . Boils     near vaginal area  . Hyperlipidemia   . Itchy eyes   . History of bladder infections   . Frequent urination at night   . Chronic kidney disease    stage 3; low kidney function; BUN was high acc to pt  . Hypothyroidism    Past Surgical History  Procedure Laterality Date  . Dilation and curettage of uterus    . Stapedectomy Left     ear  . Shoulder surgery      bilateral rotator cuff repairs  . Cholecystectomy  1971  . Tubal ligation  1971  . Colonoscopy    . Maximum access (mas)posterior lumbar interbody fusion (plif) 1 level N/A 11/07/2014    Procedure: Lumbar Four-Five Maximum access posterior lumbar interbody fusion;  Surgeon: Erline Levine, MD;  Location: [facility];  Service: Neurosurgery;  Laterality: N/A;  L4-5 Maximum access posterior lumbar interbody fusion  . Back surgery      lumbar fusion   Family History  Problem Relation Age of Onset  . Colon cancer Brother   . Diabetes Mother     and Sister   . Uterine cancer Sister    Social History  Substance Use Topics  . Smoking status: Never Smoker   . Smokeless tobacco: Never Used  . Alcohol Use: No   OB History    No data available     Review of Systems  Constitutional: Negative for fever.  Gastrointestinal: Negative for  abdominal pain and bowel incontinence.  Genitourinary: Negative for bladder incontinence, dysuria and pelvic pain.  Musculoskeletal: Positive for back pain.  Neurological: Positive for tingling and paresthesias. Negative for weakness and numbness.  All other systems reviewed and are negative.   Allergies  Onglyza; Polysporin; Cephalosporins; Lisinopril; Nsaids; Penicillins; Victoza; and Mucinex  Home Medications   Prior to Admission medications   Medication Sig Start Date End Date Taking? Authorizing Provider  amLODipine (NORVASC) 5 MG tablet 1/2 tablet daily in the morning for hypertension    Historical Provider, MD  aspirin EC 81 MG tablet Take 81 mg by mouth every evening.    Historical Provider, MD  atorvastatin (LIPITOR) 10 MG tablet Take 10 mg by mouth daily.    Historical Provider, MD  B Complex-C (B-COMPLEX WITH VITAMIN C)  tablet Take 1 tablet by mouth every evening.    Historical Provider, MD  carvedilol (COREG) 6.25 MG tablet Take 6.25 mg by mouth 2 (two) times daily.     Historical Provider, MD  Cranberry-Vitamin C-Probiotic (AZO CRANBERRY) 250-30 MG TABS Take 1 tablet by mouth at bedtime.     Historical Provider, MD  Cyanocobalamin (VITAMIN B-12 IJ) Inject 1,000 mg as directed every 28 (twenty-eight) days.     Historical Provider, MD  ESTRACE VAGINAL 0.1 MG/GM vaginal cream Place 1 Applicatorful vaginally 2 (two) times a week. Apply at bedtime on Monday and Friday. 06/10/14   Historical Provider, MD  fluticasone (FLONASE) 50 MCG/ACT nasal spray Place 1 spray into the nose daily as needed for allergies.     Historical Provider, MD  gabapentin (NEURONTIN) 400 MG capsule Take 1 capsule (400 mg total) by mouth 3 (three) times daily. 02/06/13   Domenic Polite, MD  Garlic 123XX123 MG CAPS Take 1,000 mg by mouth daily.    Historical Provider, MD  Ginkgo Biloba 60 MG CAPS Take by mouth daily.    Historical Provider, MD  Insulin Pen Needle 31G X 5 MM MISC  01/11/13   Historical Provider, MD  LEVEMIR FLEXTOUCH 100 UNIT/ML Pen Inject 50 Units into the skin every morning.  07/25/14   Historical Provider, MD  levothyroxine (SYNTHROID, LEVOTHROID) 150 MCG tablet Take 150 mcg by mouth daily. 07/25/14   Historical Provider, MD  losartan-hydrochlorothiazide (HYZAAR) 100-25 MG per tablet Take 1 tablet by mouth daily. For Hypertension    Historical Provider, MD  Melatonin 10 MG TABS Take 1 tablet by mouth at bedtime.    Historical Provider, MD  Multiple Vitamin (MULTIVITAMIN) tablet Take 1 tablet by mouth every evening.     Historical Provider, MD  Multiple Vitamins-Minerals (THERA-M) TABS Take 1 tablet by mouth daily.    Historical Provider, MD  mupirocin ointment (BACTROBAN) 2 % Apply 1 application topically daily as needed (use for cuts, etc).    Historical Provider, MD  oxybutynin (DITROPAN-XL) 10 MG 24 hr tablet Take 10 mg by mouth at  bedtime.     Historical Provider, MD  polyethylene glycol (MIRALAX / GLYCOLAX) packet Take 17 g by mouth 2 (two) times daily.    Historical Provider, MD  predniSONE (DELTASONE) 20 MG tablet Take one tab by mouth twice daily for 5 days, then one daily for 3 days. Take with food. 08/25/15   Kandra Nicolas, MD  Probiotic Product (PROBIOTIC DAILY PO) Take 1 capsule by mouth daily.    Historical Provider, MD  sennosides-docusate sodium (SENOKOT-S) 8.6-50 MG tablet Take 2 tablets by mouth 2 (two) times daily.    Historical Provider, MD  sertraline (ZOLOFT) 100 MG tablet Take 150 mg by mouth every morning.     Historical Provider, MD  traMADol (ULTRAM) 50 MG tablet Take 50 mg by mouth every 6 (six) hours as needed.     Historical Provider, MD  trimethoprim (TRIMPEX) 100 MG tablet Take 100 mg by mouth at bedtime.     Historical Provider, MD  Vitamin D, Ergocalciferol, (DRISDOL) 50000 UNITS CAPS Take 50,000 Units by mouth every 30 (thirty) days. In the middle of the month 09/27/10   Historical Provider, MD   Meds Ordered and Administered this Visit  Medications - No data to display  BP 134/72 mmHg  Pulse 62  Temp(Src) 98.1 F (36.7 C) (Oral)  Wt 212 lb 8 oz (96.389 kg)  SpO2 95% No data found.   Physical Exam  Constitutional: She is oriented to person, place, and time. She appears well-developed and well-nourished. No distress.  HENT:  Mouth/Throat: Oropharynx is clear and moist.  Eyes: Conjunctivae are normal. Pupils are equal, round, and reactive to light.  Neck: Neck supple.  Pulmonary/Chest: Breath sounds normal.  Abdominal: There is no tenderness.  Musculoskeletal: She exhibits no edema.       Lumbar back: She exhibits decreased range of motion and tenderness. She exhibits no bony tenderness.       Back:       Legs: Back:  Decreased range of motion    Tenderness in left LS area as noted on diagram.  Straight leg raising test is negative.  Sitting knee extension test is negative.   Strength and sensation in the lower extremities is normal.  Patellar and achilles reflexes are present.  Patient has radicular pain left leg in distribution as noted on diagram.     Lymphadenopathy:    She has no cervical adenopathy.  Neurological: She is alert and oriented to person, place, and time.  Skin: Skin is warm and dry. No rash noted.  Nursing note and vitals reviewed.   ED Course  Procedures none    Imaging Review Dg Lumbar Spine Complete  08/25/2015  CLINICAL DATA:  Pt with low back pain that radiates down her Lt leg for 2 weeks. Prev surg on L3-L5 in June of 2016. EXAM: LUMBAR SPINE - COMPLETE 4+ VIEW COMPARISON:  01/07/2015 FINDINGS: L4-5 PLIF. Interbody fusion material noted at this level. New mild degenerative disc disease at L1-2 and every level below. 1-2 mm of anterior listhesis of L4 on L5 with otherwise normal alignment. No fracture. IMPRESSION: No acute findings with similar appearance to 01/07/2015. Electronically Signed   By: Skipper Cliche M.D.   On: 08/25/2015 18:44      MDM   1. Lumbar radiculopathy, acute    Begin prednisone burst/taper Take one prednisone tab today with food. Monitor glucose daily while taking prednisone. May continue tramadol for pain; may add Tylenol. Followup with Dr. Vertell Limber.    Kandra Nicolas, MD 08/31/15 [phone]

## 2015-08-27 ENCOUNTER — Telehealth: Payer: Self-pay

## 2015-08-27 DIAGNOSIS — Z6837 Body mass index (BMI) 37.0-37.9, adult: Secondary | ICD-10-CM | POA: Diagnosis not present

## 2015-08-27 DIAGNOSIS — M545 Low back pain: Secondary | ICD-10-CM | POA: Diagnosis not present

## 2015-08-27 DIAGNOSIS — M5417 Radiculopathy, lumbosacral region: Secondary | ICD-10-CM | POA: Diagnosis not present

## 2015-08-27 DIAGNOSIS — M4317 Spondylolisthesis, lumbosacral region: Secondary | ICD-10-CM | POA: Diagnosis not present

## 2015-08-27 DIAGNOSIS — M4806 Spinal stenosis, lumbar region: Secondary | ICD-10-CM | POA: Diagnosis not present

## 2015-08-28 ENCOUNTER — Other Ambulatory Visit: Payer: Self-pay | Admitting: Neurosurgery

## 2015-08-28 ENCOUNTER — Other Ambulatory Visit (HOSPITAL_COMMUNITY): Payer: Self-pay | Admitting: Neurosurgery

## 2015-08-28 DIAGNOSIS — M5417 Radiculopathy, lumbosacral region: Secondary | ICD-10-CM

## 2015-08-31 ENCOUNTER — Ambulatory Visit (HOSPITAL_COMMUNITY)
Admission: RE | Admit: 2015-08-31 | Discharge: 2015-08-31 | Disposition: A | Discharge status: Home / Self Care | Payer: Medicare Other | Source: Ambulatory Visit | Attending: Neurosurgery | Admitting: Neurosurgery

## 2015-08-31 DIAGNOSIS — M4806 Spinal stenosis, lumbar region: Secondary | ICD-10-CM | POA: Insufficient documentation

## 2015-08-31 DIAGNOSIS — I10 Essential (primary) hypertension: Secondary | ICD-10-CM | POA: Insufficient documentation

## 2015-08-31 DIAGNOSIS — E119 Type 2 diabetes mellitus without complications: Secondary | ICD-10-CM | POA: Insufficient documentation

## 2015-08-31 DIAGNOSIS — E785 Hyperlipidemia, unspecified: Secondary | ICD-10-CM | POA: Insufficient documentation

## 2015-08-31 DIAGNOSIS — M5136 Other intervertebral disc degeneration, lumbar region: Secondary | ICD-10-CM | POA: Diagnosis not present

## 2015-08-31 DIAGNOSIS — M47817 Spondylosis without myelopathy or radiculopathy, lumbosacral region: Secondary | ICD-10-CM | POA: Diagnosis not present

## 2015-08-31 DIAGNOSIS — M5417 Radiculopathy, lumbosacral region: Secondary | ICD-10-CM

## 2015-08-31 DIAGNOSIS — M5416 Radiculopathy, lumbar region: Secondary | ICD-10-CM | POA: Insufficient documentation

## 2015-08-31 LAB — GLUCOSE, CAPILLARY
Glucose-Capillary: 190 mg/dL — ABNORMAL HIGH (ref 65–99)
Glucose-Capillary: 167 mg/dL — ABNORMAL HIGH (ref 65–99)

## 2015-08-31 MED ORDER — ONDANSETRON HCL 4 MG/2ML IJ SOLN
4.0000 mg | Freq: Four times a day (QID) | INTRAMUSCULAR | Status: DC | PRN
Start: 2015-08-31 — End: 2015-08-31

## 2015-08-31 MED ORDER — DIAZEPAM 5 MG PO TABS: 10.0000 mg | ORAL_TABLET | Freq: Once | ORAL | Status: DC

## 2015-08-31 MED ORDER — LIDOCAINE HCL (PF) 1 % IJ SOLN
INTRAMUSCULAR | Status: AC
  Filled 2015-08-31: qty 10

## 2015-08-31 MED ORDER — IOHEXOL 180 MG/ML  SOLN: 20.0000 mL | Freq: Once | INTRAMUSCULAR | Status: DC | PRN

## 2015-08-31 MED ORDER — ONDANSETRON HCL 4 MG/2ML IJ SOLN: 4.0000 mg | Freq: Four times a day (QID) | INTRAMUSCULAR | Status: DC | PRN

## 2015-08-31 MED ORDER — DIAZEPAM 5 MG PO TABS
ORAL_TABLET | ORAL | Status: AC
  Administered 2015-08-31: 10 mg
  Filled 2015-08-31: qty 2

## 2015-08-31 NOTE — Procedures (Signed)
LP L 23 without complication.  10 cc Omnipaque 180 instilled.

## 2015-08-31 NOTE — Discharge Instructions (Signed)
Myelography, Care After °These instructions give you information on caring for yourself after your procedure. Your doctor may also give you more specific instructions. Call your doctor if you have any problems or questions after your procedure. °HOME CARE °· Rest the first day. °· When you rest, lie flat, with your head slightly raised (elevated). °· Avoid heavy lifting and activity for 48 hours, or as told by your doctor. °· You may take the bandage (dressing) off one day after the test, or as told by your doctor. °· Take all medicines only as told by your doctor. °· Ask your doctor when it is okay to take a shower or bath. °· Ask your doctor when your test results will be ready and how you can get them. Make sure you follow up and get your results. °· Do not drink alcohol for 24 hours, or as told by your doctor. °· Drink enough fluid to keep your pee (urine) clear or pale yellow. °GET HELP IF:  °· You have a fever. °· You have a headache. °· You feel sick to your stomach (nauseous) or throw up (vomit). °· You have pain or cramping in your belly (abdomen). °GET HELP RIGHT AWAY IF:  °· You have a headache with a stiff neck or fever. °· You have trouble breathing. °· Any of the places where the needles were put in are: °¨ Puffy (swollen) or red. °¨ Sore or hot to the touch. °¨ Draining yellowish-white fluid (pus). °¨ Bleeding. °MAKE SURE YOU: °· Understand these instructions. °· Will watch your condition. °· Will get help right away if you are not doing well or get worse. °  °This information is not intended to replace advice given to you by your health care provider. Make sure you discuss any questions you have with your health care provider. °  °Document Released: 02/23/2008 Document Revised: 06/06/2014 Document Reviewed: 02/08/2012 °Elsevier Interactive Patient Education ©2016 [employer] Inc. ° °

## 2015-09-23 DIAGNOSIS — M4317 Spondylolisthesis, lumbosacral region: Secondary | ICD-10-CM | POA: Diagnosis not present

## 2015-09-23 DIAGNOSIS — M4806 Spinal stenosis, lumbar region: Secondary | ICD-10-CM | POA: Diagnosis not present

## 2015-09-23 DIAGNOSIS — Z6836 Body mass index (BMI) 36.0-36.9, adult: Secondary | ICD-10-CM | POA: Diagnosis not present

## 2015-09-23 DIAGNOSIS — M545 Low back pain: Secondary | ICD-10-CM | POA: Diagnosis not present

## 2015-09-23 DIAGNOSIS — M5417 Radiculopathy, lumbosacral region: Secondary | ICD-10-CM | POA: Diagnosis not present

## 2015-09-30 ENCOUNTER — Encounter: Payer: Self-pay | Admitting: Physical Therapy

## 2015-09-30 ENCOUNTER — Ambulatory Visit (INDEPENDENT_AMBULATORY_CARE_PROVIDER_SITE_OTHER): Payer: Medicare Other | Admitting: Physical Therapy

## 2015-09-30 DIAGNOSIS — M5441 Lumbago with sciatica, right side: Secondary | ICD-10-CM

## 2015-09-30 DIAGNOSIS — M5442 Lumbago with sciatica, left side: Secondary | ICD-10-CM | POA: Diagnosis not present

## 2015-09-30 DIAGNOSIS — M6281 Muscle weakness (generalized): Secondary | ICD-10-CM

## 2015-09-30 NOTE — Therapy (Signed)
[address] [place], [place], [address] Phone: [phone]   Fax:  [phone]  Physical Therapy Evaluation  Patient Details  Name: Abigail [name] MRN: [id number] Date of Birth: 1940-07-01 Referring Provider: Dr Vickie Epley  Encounter Date: 09/30/2015      PT End of Session - 09/30/15 1441    Visit Number 1   Number of Visits 12   Date for PT Re-Evaluation 11/11/15   PT Start Time [id number]   PT Stop Time 1540   PT Time Calculation (min) 56 min   Activity Tolerance Patient tolerated treatment well      Past Medical History  Diagnosis Date  . CAD (coronary artery disease)   . Obesity   . Fibromyalgia   . Family hx of colon cancer   . Depression   . Hypertension   . Obesity   . GERD (gastroesophageal reflux disease)     pt denies  . PONV (postoperative nausea and vomiting)     with stapedectomy  . Torn rotator cuff left    current  . Heart murmur   . Diabetes mellitus     Type 2  . Frequency of urination   . Constipation due to pain medication   . Boils     near vaginal area  . Hyperlipidemia   . Itchy eyes   . History of bladder infections   . Frequent urination at night   . Chronic kidney disease     stage 3; low kidney function; BUN was high acc to pt  . Hypothyroidism     Past Surgical History  Procedure Laterality Date  . Dilation and curettage of uterus    . Stapedectomy Left     ear  . Shoulder surgery      bilateral rotator cuff repairs  . Cholecystectomy  1971  . Tubal ligation  1971  . Colonoscopy    . Maximum access (mas)posterior lumbar interbody fusion (plif) 1 level N/A 11/07/2014    Procedure: Lumbar Four-Five Maximum access posterior lumbar interbody fusion;  Surgeon: Erline Levine, MD;  Location: [facility];  Service: Neurosurgery;  Laterality: N/A;  L4-5 Maximum access posterior lumbar interbody fusion  . Back surgery      lumbar fusion    There were no vitals filed  for this visit.       Subjective Assessment - 09/30/15 1441    Subjective Pt reports she began having back pain in Feb and she started wearing her back brace again. MD wants her to have injections however she is hesitant due to her diabetes, she wishes to try PT first.   She also has a brace her Rt knee a couple weeks ago due to arthritis and yesterday she dropped a container of milk on her Rt foot and has a large bruise on the top of her foot and second metatarsal.  Now with L2-3 spondylolisthesis    Pertinent History L4-5 fusion 10/2014, participating in chair yoga   How long can you sit comfortably? no limitations   How long can you walk comfortably? hasn't been walking due to pain, was walking a mile a day until her knee and back acted up.  Was [facility] aerobics [facility]   Diagnostic tests mylegram showed stenosis and spondylolisthesis    Patient Stated Goals strengthen her back and help decrease her pain   Currently in Pain? No/denies  has pain when she is up and around goes up  to 6/10, settles down to nothing with sitting and lying down.             [facility] PT Assessment - 09/30/15 0001    Assessment   Medical Diagnosis Lumbar Spondylolisthesis   Referring Provider Dr Vickie Epley   Onset Date/Surgical Date 07/03/15   Next MD Visit 11/23/15   Prior Therapy last year for her shoulder   Precautions   Precautions None   Required Braces or Orthoses Spinal Brace  patient wears it for comfort, not ordered by MD. Rt knee brace due to arthritis   Balance Screen   Has the patient fallen in the past 6 months Yes   How many times? 1 in January in the bathroom, kitten tripped her up   Has the patient had a decrease in activity level because of a fear of falling?  No   Is the patient reluctant to leave their home because of a fear of falling?  No   Home Social worker Private residence   Home Access Stairs to enter  does ok, one at a time d/t knee and back   Prior  Function   Level of Independence Independent   Vocation Retired   Leisure play with her cat, read   Observation/Other Assessments   Focus on Therapeutic Outcomes (FOTO)  51% limited   Functional Tests   Functional tests Squat;Single leg stance   Squat   Comments mini squat - Rt knee popping   Posture/Postural Control   Posture/Postural Control Postural limitations   Postural Limitations Forward head;Increased lumbar lordosis  extra abdominal girth   ROM / Strength   AROM / PROM / Strength Strength;AROM   Strength   Strength Assessment Site Hip;Knee;Lumbar   Right/Left Hip Right;Left   Right Hip Flexion 5/5   Right Hip Extension 5/5   Right Hip ABduction 4-/5   Left Hip Flexion 5/5   Left Hip Extension 4/5  with back pain   Left Hip ABduction 4-/5   Right/Left Knee --  bilat WNL, pain with Rt knee ext   Lumbar Flexion --  TA poor   Lumbar Extension --  multifidi Rt fair, Lt poor   Flexibility   Soft Tissue Assessment /Muscle Length yes   Hamstrings Lt 50degrees, Rt 80 degrees                   [facility] Adult PT Treatment/Exercise - 09/30/15 0001    Exercises   Exercises Lumbar   Lumbar Exercises: Stretches   Passive Hamstring Stretch 2 reps;30 seconds  with strap   Lumbar Exercises: Supine   Bridge 10 reps   Other Supine Lumbar Exercises abd bracing with marching and opposite arm raises.    Lumbar Exercises: Prone   Opposite Arm/Leg Raise Left arm/Right leg;Right arm/Left leg;10 reps   Modalities   Modalities Electrical Stimulation;Moist Heat   Moist Heat Therapy   Number Minutes Moist Heat 15 Minutes   Moist Heat Location Lumbar Spine  prone   Electrical Stimulation   Electrical Stimulation Location lumbar   Electrical Stimulation Action IFC   Electrical Stimulation Parameters to tolerance   Electrical Stimulation Goals Pain                     PT Long Term Goals - 09/30/15 1534    PT LONG TERM GOAL #1   Title Demonstrate good body  mechanics getting in/out of bed consistently (11/11/15)    Time 6  Period Weeks   Status New   PT LONG TERM GOAL #2   Title Be independent with advanced HEP (11/11/15)    Time 6   Period Weeks   Status New   PT LONG TERM GOAL #3   Title Report pain decrease =/>50% in her low back with daily activity ( 11/11/15)    Time 6   Period Weeks   Status New   PT LONG TERM GOAL #4   Title demo strong contraction of TA and multifidi with core ther ex ( 11/11/15)    Time 6   Period Weeks   PT LONG TERM GOAL #5   Title Improve FOTO =/< 36% limited (CJ level) (11/11/15)    Time 6   Period Weeks   Status New               Plan - 09/30/15 1530    Clinical Impression Statement 75 yo female one yr s/p lower lumbar fusion and now with L2-3 spondylolisthesis, she has been wearing her post op back brace for comfort.  She also has a home TENs unit for her shoulder however hasn't tried it on her back. She has core weakness, tight muscles in her lumbar region with tenderness.  Abigail [name] also is wearing a knee brace Rt due to arthritis.    Rehab Potential Good   PT Frequency 2x / week   PT Duration 6 weeks   PT Treatment/Interventions Ultrasound;Neuromuscular re-education;Patient/family education;Dry needling;Cryotherapy;Electrical Stimulation;Moist Heat;Therapeutic exercise;Manual techniques;Taping   PT Next Visit Plan progress core ther ex, Rt knee strengthening, modalities PRN   Consulted and Agree with Plan of Care Patient      Patient will benefit from skilled therapeutic intervention in order to improve the following deficits and impairments:  Postural dysfunction, Decreased strength, Pain, Increased muscle spasms, Difficulty walking  Visit Diagnosis: Muscle weakness (generalized) - Plan: PT plan of care cert/re-cert  Lumbago with sciatica, left side - Plan: PT plan of care cert/re-cert  Lumbago with sciatica, right side - Plan: PT plan of care cert/re-cert     Problem List Patient  Active Problem List   Diagnosis Date Noted  . Pressure ulcer 11/10/2014  . Spondylolisthesis of lumbar region 11/07/2014  . Acute encephalopathy 02/05/2013  . Anemia 02/05/2013  . Acute renal failure ([place]) 02/05/2013  . Dehydration 09/12/2012  . Diabetes mellitus ([place]) 09/12/2012  . Hypertension 09/12/2012  . Hypothyroidism 09/12/2012  . Hyperlipidemia 09/12/2012    Jeral Pinch PT 09/30/2015, 3:39 PM  [facility] [address] [place], [place], [address] Phone: [phone]   Fax:  [phone]  Name: Abigail [name] MRN: [id number] Date of Birth: 1940/06/15

## 2015-09-30 NOTE — Patient Instructions (Addendum)
Arm / Leg Lift: Opposite (Prone)  [place] [phone]    Lift right leg and opposite arm _4-6___ inches from floor, keeping knee locked. Repeat on the opposite side.  Repeat _10___ times per set. Do _1-2___ sets per session. Do __1__ sessions per day.  Bridging    Slowly raise buttocks from floor, keeping stomach tight. Repeat _10___ times per set. Do _1-2___ sets per session. Do ___1_ sessions per day.  Combination (Hook-Lying)    Tighten stomach and slowly raise left leg and lower opposite arm over head. Keep trunk rigid. Repeat _10___ times per set. Do __1-2__ sets per session. Do _1___ sessions per day.  Supine: Leg Stretch with Strap (Super Advanced)    Lie on back with one leg straight. Hook strap around other foot. Straighten knee. Raise leg to maximal stretch and straighten knee further by tightening quadriceps. Slowly press other leg down as close to floor as possible. Keep lower abdominals tight. Hold _30-45__ seconds. Warning: Intense stretch. Stay within tolerance. Repeat _2__ times per session. Do _1_ sessions per day.  Copyright  VHI. All rights reserved.    TENS UNIT: This is helpful for muscle pain and spasm.   Search and Purchase a TENS 7000 2nd edition at www.tenspros.com. It should be less than $30.     TENS unit instructions: Do not shower or bathe with the unit on Turn the unit off before removing electrodes or batteries If the electrodes lose stickiness add a drop of water to the electrodes after they are disconnected from the unit and place on plastic sheet. If you continued to have difficulty, call the TENS unit company to purchase more electrodes. Do not apply lotion on the skin area prior to use. Make sure the skin is clean and dry as this will help prolong the life of the electrodes. After use, always check skin for unusual red areas, rash or other skin difficulties. If there are any skin problems, does not apply electrodes to the same  area. Never remove the electrodes from the unit by pulling the wires. Do not use the TENS unit or electrodes other than as directed. Do not change electrode placement without consultating your therapist or physician. Keep 2 fingers with between each electrode. Wear time ratio is 2:1, on to off times.    For example on for 30 minutes off for 15 minutes and then on for 30 minutes off for 15 minutes

## 2015-10-02 ENCOUNTER — Other Ambulatory Visit: Payer: Self-pay | Admitting: Family Medicine

## 2015-10-02 ENCOUNTER — Ambulatory Visit
Admission: RE | Admit: 2015-10-02 | Discharge: 2015-10-02 | Disposition: A | Discharge status: Home / Self Care | Payer: Medicare Other | Source: Ambulatory Visit | Attending: Family Medicine | Admitting: Family Medicine

## 2015-10-02 DIAGNOSIS — E559 Vitamin D deficiency, unspecified: Secondary | ICD-10-CM | POA: Diagnosis not present

## 2015-10-02 DIAGNOSIS — S90121A Contusion of right lesser toe(s) without damage to nail, initial encounter: Secondary | ICD-10-CM

## 2015-10-02 DIAGNOSIS — D510 Vitamin B12 deficiency anemia due to intrinsic factor deficiency: Secondary | ICD-10-CM | POA: Diagnosis not present

## 2015-10-02 DIAGNOSIS — M549 Dorsalgia, unspecified: Secondary | ICD-10-CM | POA: Diagnosis not present

## 2015-10-02 DIAGNOSIS — M62838 Other muscle spasm: Secondary | ICD-10-CM | POA: Diagnosis not present

## 2015-10-02 DIAGNOSIS — E1140 Type 2 diabetes mellitus with diabetic neuropathy, unspecified: Secondary | ICD-10-CM | POA: Diagnosis not present

## 2015-10-02 DIAGNOSIS — E039 Hypothyroidism, unspecified: Secondary | ICD-10-CM | POA: Diagnosis not present

## 2015-10-02 DIAGNOSIS — G4700 Insomnia, unspecified: Secondary | ICD-10-CM | POA: Diagnosis not present

## 2015-10-02 DIAGNOSIS — I10 Essential (primary) hypertension: Secondary | ICD-10-CM | POA: Diagnosis not present

## 2015-10-02 DIAGNOSIS — E782 Mixed hyperlipidemia: Secondary | ICD-10-CM | POA: Diagnosis not present

## 2015-10-02 DIAGNOSIS — Z0000 Encounter for general adult medical examination without abnormal findings: Secondary | ICD-10-CM | POA: Diagnosis not present

## 2015-10-05 ENCOUNTER — Ambulatory Visit (INDEPENDENT_AMBULATORY_CARE_PROVIDER_SITE_OTHER): Payer: Medicare Other | Admitting: Physical Therapy

## 2015-10-05 ENCOUNTER — Encounter: Payer: Self-pay | Admitting: Physical Therapy

## 2015-10-05 DIAGNOSIS — M5442 Lumbago with sciatica, left side: Secondary | ICD-10-CM

## 2015-10-05 DIAGNOSIS — M6281 Muscle weakness (generalized): Secondary | ICD-10-CM | POA: Diagnosis not present

## 2015-10-05 DIAGNOSIS — M5441 Lumbago with sciatica, right side: Secondary | ICD-10-CM

## 2015-10-05 NOTE — Therapy (Signed)
Abigail [name] [place], [place], [address] Phone: [phone]   Fax:  [phone]  Physical Therapy Treatment  Patient Details  Name: Abigail [name] MRN: [id number] Date of Birth: December 17, 1940 Referring Provider: Dr Vickie Epley  Encounter Date: 10/05/2015      PT End of Session - 10/05/15 1533    Visit Number 2   Number of Visits 12   Date for PT Re-Evaluation 11/11/15   PT Start Time 1522   PT Stop Time 1611   PT Time Calculation (min) 49 min   Activity Tolerance Patient tolerated treatment well      Past Medical History  Diagnosis Date  . CAD (coronary artery disease)   . Obesity   . Fibromyalgia   . Family hx of [name] cancer   . Depression   . Hypertension   . Obesity   . GERD (gastroesophageal reflux disease)     pt denies  . PONV (postoperative nausea and vomiting)     with stapedectomy  . Torn rotator cuff left    current  . Heart murmur   . Diabetes mellitus     Type 2  . Frequency of urination   . Constipation due to pain medication   . Boils     near vaginal area  . Hyperlipidemia   . Itchy eyes   . History of bladder infections   . Frequent urination at night   . Chronic kidney disease     stage 3; low kidney function; BUN was high acc to pt  . Hypothyroidism     Past Surgical History  Procedure Laterality Date  . Dilation and curettage of uterus    . Stapedectomy Left     ear  . Shoulder surgery      bilateral rotator cuff repairs  . Cholecystectomy  1971  . Tubal ligation  1971  . Colonoscopy    . Maximum access (mas)posterior lumbar interbody fusion (plif) 1 level N/A 11/07/2014    Procedure: Lumbar Four-Five Maximum access posterior lumbar interbody fusion;  Surgeon: Erline Levine, MD;  Location: [facility];  Service: Neurosurgery;  Laterality: N/A;  L4-5 Maximum access posterior lumbar interbody fusion  . Back surgery      lumbar fusion    There were no vitals filed  for this visit.      Subjective Assessment - 10/05/15 1524    Subjective Was able to perform her ther ex a couple of times. She is having a bad pain day today and having to use her medication, ice/heat.    Currently in Pain? Yes   Pain Score 4    Pain Location Back   Pain Orientation Lower;Mid   Pain Descriptors / Indicators Stabbing   Pain Type Chronic pain   Pain Onset More than a month ago   Pain Frequency Constant   Aggravating Factors  standing    Pain Relieving Factors pain medication helps calm it down                         [facility] Adult PT Treatment/Exercise - 10/05/15 0001    Lumbar Exercises: Stretches   Passive Hamstring Stretch 2 reps;30 seconds  with strap   Piriformis Stretch --  modified - without lifting the leg   Lumbar Exercises: Supine   Ab Set 10 reps   Clam 10 reps   Bent Knee Raise 10 reps   Straight Leg Raise 10  reps  leg press with Abd bracing   Lumbar Exercises: Sidelying   Hip Abduction 20 reps  bilat   Other Sidelying Lumbar Exercises hip ext 20 reps bilat   Modalities   Modalities Electrical Stimulation;Moist Heat   Moist Heat Therapy   Number Minutes Moist Heat 15 Minutes   Moist Heat Location Lumbar Spine   Electrical Stimulation   Electrical Stimulation Location lumbar   Electrical Stimulation Action IFC   Electrical Stimulation Parameters to tolerance   Electrical Stimulation Goals Pain                     PT Long Term Goals - 10/05/15 1528    PT LONG TERM GOAL #1   Title Demonstrate good body mechanics getting in/out of bed consistently (11/11/15)    Time 6   Period Weeks   Status On-going   PT LONG TERM GOAL #2   Title Be independent with advanced HEP (11/11/15)    Time 6   Period Weeks   Status On-going   PT LONG TERM GOAL #3   Title Report pain decrease =/>50% in her low back with daily activity ( 11/11/15)    Time 6   Period Weeks   Status On-going   PT LONG TERM GOAL #4   Title demo strong  contraction of TA and multifidi with core ther ex ( 11/11/15)    Time 6   Period Weeks   Status On-going   PT LONG TERM GOAL #5   Title Improve FOTO =/< 36% limited (CJ level) (11/11/15)    Time 6   Period Weeks   Status On-going               Plan - 10/05/15 1537    Clinical Impression Statement This is Morgane's first treatment since her evaluation.  She is very weak in her core and into her hips.  Fatigues with exercise and is also limited due to pain. No goals met   Rehab Potential Good   PT Frequency 2x / week   PT Duration 6 weeks   PT Treatment/Interventions Ultrasound;Neuromuscular re-education;Patient/family education;Dry needling;Cryotherapy;Electrical Stimulation;Moist Heat;Therapeutic exercise;Manual techniques;Taping   PT Next Visit Plan progress core ther ex, Rt knee strengthening, modalities PRN   Consulted and Agree with Plan of Care Patient      Patient will benefit from skilled therapeutic intervention in order to improve the following deficits and impairments:  Postural dysfunction, Decreased strength, Pain, Increased muscle spasms, Difficulty walking  Visit Diagnosis: Lumbago with sciatica, right side  Lumbago with sciatica, left side  Muscle weakness (generalized)     Problem List Patient Active Problem List   Diagnosis Date Noted  . Pressure ulcer 11/10/2014  . Spondylolisthesis of lumbar region 11/07/2014  . Acute encephalopathy 02/05/2013  . Anemia 02/05/2013  . Acute renal failure ([place]) 02/05/2013  . Dehydration 09/12/2012  . Diabetes mellitus ([place]) 09/12/2012  . Hypertension 09/12/2012  . Hypothyroidism 09/12/2012  . Hyperlipidemia 09/12/2012    Jeral Pinch PT 10/05/2015, 4:00 PM  [facility] [place], [place], [address] Phone: [phone]   Fax:  [phone]  Name: Abigail [name] MRN: [id number] Date of Birth: [birth year]

## 2015-10-06 DIAGNOSIS — E1140 Type 2 diabetes mellitus with diabetic neuropathy, unspecified: Secondary | ICD-10-CM | POA: Diagnosis not present

## 2015-10-06 DIAGNOSIS — D510 Vitamin B12 deficiency anemia due to intrinsic factor deficiency: Secondary | ICD-10-CM | POA: Diagnosis not present

## 2015-10-06 DIAGNOSIS — E559 Vitamin D deficiency, unspecified: Secondary | ICD-10-CM | POA: Diagnosis not present

## 2015-10-06 DIAGNOSIS — M62838 Other muscle spasm: Secondary | ICD-10-CM | POA: Diagnosis not present

## 2015-10-06 DIAGNOSIS — G4700 Insomnia, unspecified: Secondary | ICD-10-CM | POA: Diagnosis not present

## 2015-10-06 DIAGNOSIS — Z794 Long term (current) use of insulin: Secondary | ICD-10-CM | POA: Diagnosis not present

## 2015-10-06 DIAGNOSIS — E782 Mixed hyperlipidemia: Secondary | ICD-10-CM | POA: Diagnosis not present

## 2015-10-06 DIAGNOSIS — I10 Essential (primary) hypertension: Secondary | ICD-10-CM | POA: Diagnosis not present

## 2015-10-06 DIAGNOSIS — M549 Dorsalgia, unspecified: Secondary | ICD-10-CM | POA: Diagnosis not present

## 2015-10-06 DIAGNOSIS — Z0000 Encounter for general adult medical examination without abnormal findings: Secondary | ICD-10-CM | POA: Diagnosis not present

## 2015-10-06 DIAGNOSIS — E039 Hypothyroidism, unspecified: Secondary | ICD-10-CM | POA: Diagnosis not present

## 2015-10-08 ENCOUNTER — Ambulatory Visit (INDEPENDENT_AMBULATORY_CARE_PROVIDER_SITE_OTHER): Payer: Medicare Other | Admitting: Physical Therapy

## 2015-10-08 DIAGNOSIS — M5442 Lumbago with sciatica, left side: Secondary | ICD-10-CM | POA: Diagnosis not present

## 2015-10-08 DIAGNOSIS — M5441 Lumbago with sciatica, right side: Secondary | ICD-10-CM | POA: Diagnosis present

## 2015-10-08 DIAGNOSIS — M6281 Muscle weakness (generalized): Secondary | ICD-10-CM

## 2015-10-08 NOTE — Therapy (Signed)
[place], [place], [address] Phone: [phone]   Fax:  [phone]  Physical Therapy Treatment  Patient Details  Name: Abigail [name] MRN: [id number] Date of Birth: 05-01-1941 Referring Provider: Dr. Vickie Epley  Encounter Date: 10/08/2015      PT End of Session - 10/08/15 1623    Visit Number 3   Number of Visits 12   Date for PT Re-Evaluation 11/11/15   PT Start Time [id number]   PT Stop Time 1707   PT Time Calculation (min) 50 min   Activity Tolerance Patient tolerated treatment well      Past Medical History  Diagnosis Date  . CAD (coronary artery disease)   . Obesity   . Fibromyalgia   . Family hx of colon cancer   . Depression   . Hypertension   . Obesity   . GERD (gastroesophageal reflux disease)     pt denies  . PONV (postoperative nausea and vomiting)     with stapedectomy  . Torn rotator cuff left    current  . Heart murmur   . Diabetes mellitus     Type 2  . Frequency of urination   . Constipation due to pain medication   . Boils     near vaginal area  . Hyperlipidemia   . Itchy eyes   . History of bladder infections   . Frequent urination at night   . Chronic kidney disease     stage 3; low kidney function; BUN was high acc to pt  . Hypothyroidism     Past Surgical History  Procedure Laterality Date  . Dilation and curettage of uterus    . Stapedectomy Left     ear  . Shoulder surgery      bilateral rotator cuff repairs  . Cholecystectomy  1971  . Tubal ligation  1971  . Colonoscopy    . Maximum access (mas)posterior lumbar interbody fusion (plif) 1 level N/A 11/07/2014    Procedure: Lumbar Four-Five Maximum access posterior lumbar interbody fusion;  Surgeon: Erline Levine, MD;  Location: [facility];  Service: Neurosurgery;  Laterality: N/A;  L4-5 Maximum access posterior lumbar interbody fusion  . Back surgery      lumbar fusion    There were no vitals filed  for this visit.      Subjective Assessment - 10/08/15 1623    Subjective Pt reports it is not a good day.  Pt complains of ears being stopped up and persistant back pain.  Pt brought TENS unit in and requests further instruction in use.    Patient Stated Goals strengthen her back and help decrease her pain   Currently in Pain? Yes   Pain Score 4    Pain Location Back   Pain Orientation Lower;Mid   Pain Descriptors / Indicators Aching   Aggravating Factors  standing or sitting too long   Pain Relieving Factors pain medication             [facility] PT Assessment - 10/08/15 0001    Assessment   Medical Diagnosis Lumbar Spondylolisthesis   Referring Provider Dr. Vickie Epley   Onset Date/Surgical Date 07/03/15   Next MD Visit 11/23/15          [facility] Adult PT Treatment/Exercise - 10/08/15 0001    Self-Care   Self-Care Other Self-Care Comments   Other Self-Care Comments  Pt educate on use and application of TENS unit.  pt verbalized  understanding.    Lumbar Exercises: Stretches   Passive Hamstring Stretch 2 reps;30 seconds  with strap   Lumbar Exercises: Aerobic   Stationary Bike NuStep L4: arms and legs 6 min    Lumbar Exercises: Supine   Ab Set 10 reps   Clam 10 reps  with ab set   Bent Knee Raise 10 reps  with ab set   Bent Knee Raise Limitations (then opposite arm /leg marching)    Other Supine Lumbar Exercises leg press with abdominal bracing x 5 sec hold x 5 reps each leg   Lumbar Exercises: Sidelying   Hip Abduction 10 reps  each side   Other Sidelying Lumbar Exercises hip ext 10 reps bilat   Modalities   Modalities Electrical Stimulation;Moist Heat   Moist Heat Therapy   Number Minutes Moist Heat 15 Minutes   Moist Heat Location Lumbar Spine   Electrical Stimulation   Electrical Stimulation Location lumbar   Electrical Stimulation Action IFC   Electrical Stimulation Parameters to tolerance    Electrical Stimulation Goals Pain                     PT  Long Term Goals - 10/05/15 1528    PT LONG TERM GOAL #1   Title Demonstrate good body mechanics getting in/out of bed consistently (11/11/15)    Time 6   Period Weeks   Status On-going   PT LONG TERM GOAL #2   Title Be independent with advanced HEP (11/11/15)    Time 6   Period Weeks   Status On-going   PT LONG TERM GOAL #3   Title Report pain decrease =/>50% in her low back with daily activity ( 11/11/15)    Time 6   Period Weeks   Status On-going   PT LONG TERM GOAL #4   Title demo strong contraction of TA and multifidi with core ther ex ( 11/11/15)    Time 6   Period Weeks   Status On-going   PT LONG TERM GOAL #5   Title Improve FOTO =/< 36% limited (CJ level) (11/11/15)    Time 6   Period Weeks   Status On-going               Plan - 10/08/15 1658    Clinical Impression Statement Pt tolerated all exercises with minimal increase in pain, mostly just fatigue.  Pt verbalized understanding of use and application of TENS unit for home use for pain relief. Pt demonstrated improved transverse abdominus contraction today.  Making gradual progress towards goals.    Rehab Potential Good   PT Frequency 2x / week   PT Duration 6 weeks   PT Treatment/Interventions Ultrasound;Neuromuscular re-education;Patient/family education;Dry needling;Cryotherapy;Electrical Stimulation;Moist Heat;Therapeutic exercise;Manual techniques;Taping   PT Next Visit Plan progress core ther ex, Rt knee strengthening, modalities PRN   Consulted and Agree with Plan of Care Patient      Patient will benefit from skilled therapeutic intervention in order to improve the following deficits and impairments:  Postural dysfunction, Decreased strength, Pain, Increased muscle spasms, Difficulty walking  Visit Diagnosis: Lumbago with sciatica, right side  Lumbago with sciatica, left side  Muscle weakness (generalized)     Problem List Patient Active Problem List   Diagnosis Date Noted  . Pressure ulcer  11/10/2014  . Spondylolisthesis of lumbar region 11/07/2014  . Acute encephalopathy 02/05/2013  . Anemia 02/05/2013  . Acute renal failure ([place]) 02/05/2013  . Dehydration 09/12/2012  . Diabetes mellitus (  [place]) 09/12/2012  . Hypertension 09/12/2012  . Hypothyroidism 09/12/2012  . Hyperlipidemia 09/12/2012   Kerin Perna, PTA 10/08/2015 5:11 PM  [place], [place], [address] Phone: [phone]   Fax:  [phone]  Name: Abigail [name] MRN: [id number] Date of Birth: 17-Mar-1941

## 2015-10-12 ENCOUNTER — Ambulatory Visit (INDEPENDENT_AMBULATORY_CARE_PROVIDER_SITE_OTHER): Payer: Medicare Other | Admitting: Physical Therapy

## 2015-10-12 DIAGNOSIS — M5442 Lumbago with sciatica, left side: Secondary | ICD-10-CM

## 2015-10-12 DIAGNOSIS — M5441 Lumbago with sciatica, right side: Secondary | ICD-10-CM

## 2015-10-12 DIAGNOSIS — M6281 Muscle weakness (generalized): Secondary | ICD-10-CM | POA: Diagnosis not present

## 2015-10-12 NOTE — Therapy (Signed)
[place], [place], [address] Phone: [phone]   Fax:  [phone]  Physical Therapy Treatment  Patient Details  Name: Abigail [name] MRN: [id number] Date of Birth: 28-Jul-1940 Referring Provider: Dr. Vickie Epley   Encounter Date: 10/12/2015      PT End of Session - 10/12/15 1438    Visit Number 4   Number of Visits 12   Date for PT Re-Evaluation 11/11/15   PT Start Time [id number]   PT Stop Time 1530   PT Time Calculation (min) 56 min   Activity Tolerance Patient tolerated treatment well      Past Medical History  Diagnosis Date  . CAD (coronary artery disease)   . Obesity   . Fibromyalgia   . Family hx of colon cancer   . Depression   . Hypertension   . Obesity   . GERD (gastroesophageal reflux disease)     pt denies  . PONV (postoperative nausea and vomiting)     with stapedectomy  . Torn rotator cuff left    current  . Heart murmur   . Diabetes mellitus     Type 2  . Frequency of urination   . Constipation due to pain medication   . Boils     near vaginal area  . Hyperlipidemia   . Itchy eyes   . History of bladder infections   . Frequent urination at night   . Chronic kidney disease     stage 3; low kidney function; BUN was high acc to pt  . Hypothyroidism     Past Surgical History  Procedure Laterality Date  . Dilation and curettage of uterus    . Stapedectomy Left     ear  . Shoulder surgery      bilateral rotator cuff repairs  . Cholecystectomy  1971  . Tubal ligation  1971  . Colonoscopy    . Maximum access (mas)posterior lumbar interbody fusion (plif) 1 level N/A 11/07/2014    Procedure: Lumbar Four-Five Maximum access posterior lumbar interbody fusion;  Surgeon: Erline Levine, MD;  Location: [facility];  Service: Neurosurgery;  Laterality: N/A;  L4-5 Maximum access posterior lumbar interbody fusion  . Back surgery      lumbar fusion    There were no vitals  filed for this visit.      Subjective Assessment - 10/12/15 1438    Subjective Pt reports it has been, "a bad 4 days", but today is a little better.  Used TENS unit 2x since last visit, not sure if it helped or not.     Patient Stated Goals strengthen her back and help decrease her pain   Currently in Pain? Yes   Pain Score 3    Pain Location Back   Pain Orientation Lower;Mid   Aggravating Factors  standing    Pain Relieving Factors pain medication    Multiple Pain Sites Yes   Pain Score 3   Pain Location Knee   Pain Orientation Right   Pain Descriptors / Indicators Sharp   Aggravating Factors  walking    Pain Relieving Factors shots            [facility] PT Assessment - 10/12/15 0001    Assessment   Medical Diagnosis Lumbar Spondylolisthesis   Referring Provider Dr. Vickie Epley    Onset Date/Surgical Date 07/03/15   Next MD Visit 11/23/15   Flexibility   Hamstrings Lt 60, Rt 78 deg  [facility] Adult PT Treatment/Exercise - 10/12/15 0001    Lumbar Exercises: Stretches   Passive Hamstring Stretch 2 reps;30 seconds  with strap   ITB Stretch 1 rep;20 seconds   Piriformis Stretch --  modified - without lifting the leg   Lumbar Exercises: Aerobic   Stationary Bike NuStep L4: arms and legs 6 min    Lumbar Exercises: Supine   Bent Knee Raise 10 reps  with ab set   Bent Knee Raise Limitations (then opposite arm /leg marching)    Bridge 10 reps  2 sets   Lumbar Exercises: Sidelying   Clam 5 reps  irritated Rt hip, switched to hip ABD   Hip Abduction 10 reps  each side   Lumbar Exercises: Prone   Straight Leg Raise 10 reps;1 second each side with core engaged.    Modalities   Modalities Electrical Stimulation;Moist Heat   Moist Heat Therapy   Number Minutes Moist Heat 15 Minutes   Moist Heat Location Lumbar Spine;Hip:Rt knee    Electrical Stimulation   Electrical Stimulation Location lumbar    Electrical Stimulation Action IFC   Electrical  Stimulation Parameters to tolerance    Electrical Stimulation Goals Pain   Manual Therapy   Manual Therapy Soft tissue mobilization   Manual therapy comments pt prone   Soft tissue mobilization to Rt/ Lt glute with passive ER/IR of hip, to tolerance                      PT Long Term Goals - 10/05/15 1528    PT LONG TERM GOAL #1   Title Demonstrate good body mechanics getting in/out of bed consistently (11/11/15)    Time 6   Period Weeks   Status On-going   PT LONG TERM GOAL #2   Title Be independent with advanced HEP (11/11/15)    Time 6   Period Weeks   Status On-going   PT LONG TERM GOAL #3   Title Report pain decrease =/>50% in her low back with daily activity ( 11/11/15)    Time 6   Period Weeks   Status On-going   PT LONG TERM GOAL #4   Title demo strong contraction of TA and multifidi with core ther ex ( 11/11/15)    Time 6   Period Weeks   Status On-going   PT LONG TERM GOAL #5   Title Improve FOTO =/< 36% limited (CJ level) (11/11/15)    Time 6   Period Weeks   Status On-going               Plan - 10/12/15 1518    Clinical Impression Statement Pt very point tender in glutes along sacral border; reduced withmanual therapy.  Pt tolerated all exercises with minimal increase in pain; required some cues for core activation and proper form.   Pt making gradual progress towards goals.    Rehab Potential Good   PT Frequency 2x / week   PT Duration 6 weeks   PT Treatment/Interventions Ultrasound;Neuromuscular re-education;Patient/family education;Dry needling;Cryotherapy;Electrical Stimulation;Moist Heat;Therapeutic exercise;Manual techniques;Taping   PT Next Visit Plan progress core ther ex, Rt knee strengthening/ stretching, modalities PRN   Consulted and Agree with Plan of Care Patient      Patient will benefit from skilled therapeutic intervention in order to improve the following deficits and impairments:  Postural dysfunction, Decreased strength,  Pain, Increased muscle spasms, Difficulty walking  Visit Diagnosis: Lumbago with sciatica, right side  Lumbago with sciatica, left side  Muscle weakness (generalized)     Problem List Patient Active Problem List   Diagnosis Date Noted  . Pressure ulcer 11/10/2014  . Spondylolisthesis of lumbar region 11/07/2014  . Acute encephalopathy 02/05/2013  . Anemia 02/05/2013  . Acute renal failure ([place]) 02/05/2013  . Dehydration 09/12/2012  . Diabetes mellitus ([place]) 09/12/2012  . Hypertension 09/12/2012  . Hypothyroidism 09/12/2012  . Hyperlipidemia 09/12/2012   Kerin Perna, PTA 10/12/2015 3:24 PM  [place], [place], [address] Phone: [phone]   Fax:  [phone]  Name: Abigail [name] MRN: [id number] Date of Birth: 07/17/1940

## 2015-10-15 ENCOUNTER — Ambulatory Visit (INDEPENDENT_AMBULATORY_CARE_PROVIDER_SITE_OTHER): Payer: Medicare Other | Admitting: Physical Therapy

## 2015-10-15 DIAGNOSIS — M5442 Lumbago with sciatica, left side: Secondary | ICD-10-CM

## 2015-10-15 DIAGNOSIS — M6281 Muscle weakness (generalized): Secondary | ICD-10-CM

## 2015-10-15 DIAGNOSIS — M5441 Lumbago with sciatica, right side: Secondary | ICD-10-CM | POA: Diagnosis present

## 2015-10-15 NOTE — Therapy (Signed)
[place], [place], [address] Phone: [phone]   Fax:  [phone]  Physical Therapy Treatment  Patient Details  Name: Abigail [name] MRN: [id number] Date of Birth: 04/02/1941 Referring Provider: Dr. Vickie Epley   Encounter Date: 10/15/2015      PT End of Session - 10/15/15 1455    Visit Number 5   Number of Visits 12   Date for PT Re-Evaluation 11/11/15   PT Start Time 1450   PT Stop Time 1547   PT Time Calculation (min) 57 min   Activity Tolerance Patient tolerated treatment well      Past Medical History  Diagnosis Date  . CAD (coronary artery disease)   . Obesity   . Fibromyalgia   . Family hx of colon cancer   . Depression   . Hypertension   . Obesity   . GERD (gastroesophageal reflux disease)     pt denies  . PONV (postoperative nausea and vomiting)     with stapedectomy  . Torn rotator cuff left    current  . Heart murmur   . Diabetes mellitus     Type 2  . Frequency of urination   . Constipation due to pain medication   . Boils     near vaginal area  . Hyperlipidemia   . Itchy eyes   . History of bladder infections   . Frequent urination at night   . Chronic kidney disease     stage 3; low kidney function; BUN was high acc to pt  . Hypothyroidism     Past Surgical History  Procedure Laterality Date  . Dilation and curettage of uterus    . Stapedectomy Left     ear  . Shoulder surgery      bilateral rotator cuff repairs  . Cholecystectomy  1971  . Tubal ligation  1971  . Colonoscopy    . Maximum access (mas)posterior lumbar interbody fusion (plif) 1 level N/A 11/07/2014    Procedure: Lumbar Four-Five Maximum access posterior lumbar interbody fusion;  Surgeon: Erline Levine, MD;  Location: [facility];  Service: Neurosurgery;  Laterality: N/A;  L4-5 Maximum access posterior lumbar interbody fusion  . Back surgery      lumbar fusion    There were no vitals  filed for this visit.      Subjective Assessment - 10/15/15 1453    Subjective Abigail [name] states the weekend was bad however yesterday and today have been better. Hasn't needed the TENs over the last couple of days, did use heat a little. She has been decrease her medicine use.    Currently in Pain? Yes   Pain Score 2    Pain Location Back   Pain Orientation Lower;Mid   Pain Type Chronic pain   Pain Frequency Constant   Aggravating Factors  standing   Pain Relieving Factors heat                          [facility] Adult PT Treatment/Exercise - 10/15/15 0001    Lumbar Exercises: Stretches   Passive Hamstring Stretch 2 reps;30 seconds  with strap   Piriformis Stretch 1 rep;30 seconds   Lumbar Exercises: Aerobic   Stationary Bike bike L0x5'   Lumbar Exercises: Supine   Ab Set 10 reps   Bridge 10 reps   2 sets, with 4/10 pain in the beginning   Isometric Hip Flexion 10 reps;5 seconds  ipsolateral & contraleteral sides.    Other Supine Lumbar Exercises 10 reps leg circle CW/CCW   Lumbar Exercises: Prone   Straight Leg Raise 10 reps  2 sets, then bilat LE lifts x 10    Other Prone Lumbar Exercises upper body lifts x 10   Modalities   Modalities Electrical Stimulation;Moist Heat   Moist Heat Therapy   Number Minutes Moist Heat 15 Minutes   Moist Heat Location Lumbar Spine;Cervical   Electrical Stimulation   Electrical Stimulation Location lumbar    Electrical Stimulation Action IFC   Electrical Stimulation Parameters to tolerance    Electrical Stimulation Goals Pain                     PT Long Term Goals - 10/05/15 1528    PT LONG TERM GOAL #1   Title Demonstrate good body mechanics getting in/out of bed consistently (11/11/15)    Time 6   Period Weeks   Status On-going   PT LONG TERM GOAL #2   Title Be independent with advanced HEP (11/11/15)    Time 6   Period Weeks   Status On-going   PT LONG TERM GOAL #3   Title Report pain decrease =/>50% in  her low back with daily activity ( 11/11/15)    Time 6   Period Weeks   Status On-going   PT LONG TERM GOAL #4   Title demo strong contraction of TA and multifidi with core ther ex ( 11/11/15)    Time 6   Period Weeks   Status On-going   PT LONG TERM GOAL #5   Title Improve FOTO =/< 36% limited (CJ level) (11/11/15)    Time 6   Period Weeks   Status On-going               Plan - 10/15/15 1531    Clinical Impression Statement Abigail [name] has had a little bit of improvement over the last couple of days.    Rehab Potential Good   PT Frequency 2x / week   PT Duration 6 weeks   PT Treatment/Interventions Ultrasound;Neuromuscular re-education;Patient/family education;Dry needling;Cryotherapy;Electrical Stimulation;Moist Heat;Therapeutic exercise;Manual techniques;Taping   PT Next Visit Plan progress core ther ex, Rt knee strengthening/ stretching, modalities PRN   Consulted and Agree with Plan of Care Patient      Patient will benefit from skilled therapeutic intervention in order to improve the following deficits and impairments:  Postural dysfunction, Decreased strength, Pain, Increased muscle spasms, Difficulty walking  Visit Diagnosis: Lumbago with sciatica, right side  Lumbago with sciatica, left side  Muscle weakness (generalized)     Problem List Patient Active Problem List   Diagnosis Date Noted  . Pressure ulcer 11/10/2014  . Spondylolisthesis of lumbar region 11/07/2014  . Acute encephalopathy 02/05/2013  . Anemia 02/05/2013  . Acute renal failure ([place]) 02/05/2013  . Dehydration 09/12/2012  . Diabetes mellitus ([place]) 09/12/2012  . Hypertension 09/12/2012  . Hypothyroidism 09/12/2012  . Hyperlipidemia 09/12/2012    Jeral Pinch PT 10/15/2015, 3:35 PM  [facility] [place], [place], [address] Phone: [phone]   Fax:  [phone]  Name: Abigail [name] MRN: [id number] Date of Birth:  1940-08-23

## 2015-10-19 ENCOUNTER — Ambulatory Visit (INDEPENDENT_AMBULATORY_CARE_PROVIDER_SITE_OTHER): Payer: Medicare Other | Admitting: Physical Therapy

## 2015-10-19 DIAGNOSIS — M6281 Muscle weakness (generalized): Secondary | ICD-10-CM | POA: Diagnosis not present

## 2015-10-19 DIAGNOSIS — M5442 Lumbago with sciatica, left side: Secondary | ICD-10-CM

## 2015-10-19 DIAGNOSIS — M5441 Lumbago with sciatica, right side: Secondary | ICD-10-CM | POA: Diagnosis present

## 2015-10-19 NOTE — Therapy (Signed)
[place], [place], [address] Phone: [phone]   Fax:  [phone]  Physical Therapy Treatment  Patient Details  Name: Abigail [name] MRN: [id number] Date of Birth: 03-23-1941 Referring Provider: Dr. Vickie Epley   Encounter Date: 10/19/2015      PT End of Session - 10/19/15 1610    Visit Number 6   Number of Visits 12   Date for PT Re-Evaluation 11/11/15   PT Start Time [id number]   PT Stop Time [id number]   PT Time Calculation (min) 54 min   Activity Tolerance Patient tolerated treatment well      Past Medical History  Diagnosis Date  . CAD (coronary artery disease)   . Obesity   . Fibromyalgia   . Family hx of colon cancer   . Depression   . Hypertension   . Obesity   . GERD (gastroesophageal reflux disease)     pt denies  . PONV (postoperative nausea and vomiting)     with stapedectomy  . Torn rotator cuff left    current  . Heart murmur   . Diabetes mellitus     Type 2  . Frequency of urination   . Constipation due to pain medication   . Boils     near vaginal area  . Hyperlipidemia   . Itchy eyes   . History of bladder infections   . Frequent urination at night   . Chronic kidney disease     stage 3; low kidney function; BUN was high acc to pt  . Hypothyroidism     Past Surgical History  Procedure Laterality Date  . Dilation and curettage of uterus    . Stapedectomy Left     ear  . Shoulder surgery      bilateral rotator cuff repairs  . Cholecystectomy  1971  . Tubal ligation  1971  . Colonoscopy    . Maximum access (mas)posterior lumbar interbody fusion (plif) 1 level N/A 11/07/2014    Procedure: Lumbar Four-Five Maximum access posterior lumbar interbody fusion;  Surgeon: Erline Levine, MD;  Location: [facility];  Service: Neurosurgery;  Laterality: N/A;  L4-5 Maximum access posterior lumbar interbody fusion  . Back surgery      lumbar fusion    There were no vitals  filed for this visit.      Subjective Assessment - 10/19/15 1607    Subjective Pt has had busy morning moving things in house preparing for mattress delivery. Did water aerobics this morning this has helped; reduced pain from 4/10 to a 2/10. She feels her legs are getting stronger.    Pertinent History L4-5 fusion 10/2014, participating in chair aerobics.    Patient Stated Goals strengthen her back and help decrease her pain   Currently in Pain? Yes   Pain Score 2    Pain Location Back   Pain Orientation Lower;Mid   Pain Descriptors / Indicators Aching   Aggravating Factors  standing    Pain Relieving Factors heat.     Multiple Pain Sites Yes   Pain Score 1   Pain Location Knee   Pain Orientation Right   Aggravating Factors  walking    Pain Relieving Factors water aerobics             [facility] Adult PT Treatment/Exercise - 10/19/15 0001    Exercises   Exercises Knee/Hip;Lumbar   Lumbar Exercises: Stretches   Passive Hamstring Stretch 2 reps;30 seconds  with  strap   Quad Stretch 2 reps;30 seconds  with strap   Piriformis Stretch 1 rep;30 seconds   Lumbar Exercises: Aerobic   Stationary Bike NuStep: L4 x 6 min (arms and legs)    Lumbar Exercises: Supine   Bent Knee Raise 10 reps   Bent Knee Raise Limitations (opposite arm /leg marching)    Bridge 10 reps   2 sets, with 4/10 pain in the beginning   Isometric Hip Flexion 10 reps;5 seconds  ipsolateral & contraleteral sides.    Knee/Hip Exercises: Seated   Other Seated Knee/Hip Exercises seated on dynadisc: pelvic tilts ant/post, side to side and circles; then marching with core engaged.    Moist Heat Therapy   Number Minutes Moist Heat 15 Minutes   Moist Heat Location Lumbar Spine;Cervical   Electrical Stimulation   Electrical Stimulation Location lumbar    Electrical Stimulation Action IFC   Electrical Stimulation Parameters to tolerance    Manual Therapy   Manual Therapy Soft tissue mobilization   Manual therapy  comments pt prone   Soft tissue mobilization to Rt/ Lt glute with passive ER/IR of hip, to tolerance           PT Long Term Goals - 10/05/15 1528    PT LONG TERM GOAL #1   Title Demonstrate good body mechanics getting in/out of bed consistently (11/11/15)    Time 6   Period Weeks   Status On-going   PT LONG TERM GOAL #2   Title Be independent with advanced HEP (11/11/15)    Time 6   Period Weeks   Status On-going   PT LONG TERM GOAL #3   Title Report pain decrease =/>50% in her low back with daily activity ( 11/11/15)    Time 6   Period Weeks   Status On-going   PT LONG TERM GOAL #4   Title demo strong contraction of TA and multifidi with core ther ex ( 11/11/15)    Time 6   Period Weeks   Status On-going   PT LONG TERM GOAL #5   Title Improve FOTO =/< 36% limited (CJ level) (11/11/15)    Time 6   Period Weeks   Status On-going               Plan - 10/19/15 1648    Clinical Impression Statement Pt tolerated all exercises well.  Continues with point tenderness in bilat glutes/ hip rotators and Lt low back paraspinals.  Improving TA contraction with exercise.    Rehab Potential Good   PT Frequency 2x / week   PT Duration 6 weeks   PT Treatment/Interventions Ultrasound;Neuromuscular re-education;Patient/family education;Dry needling;Cryotherapy;Electrical Stimulation;Moist Heat;Therapeutic exercise;Manual techniques;Taping   PT Next Visit Plan progress core ther ex, Rt knee strengthening/ stretching, modalities PRN.    Consulted and Agree with Plan of Care Patient      Patient will benefit from skilled therapeutic intervention in order to improve the following deficits and impairments:  Postural dysfunction, Decreased strength, Pain, Increased muscle spasms, Difficulty walking  Visit Diagnosis: Lumbago with sciatica, right side  Lumbago with sciatica, left side  Muscle weakness (generalized)     Problem List Patient Active Problem List   Diagnosis Date  Noted  . Pressure ulcer 11/10/2014  . Spondylolisthesis of lumbar region 11/07/2014  . Acute encephalopathy 02/05/2013  . Anemia 02/05/2013  . Acute renal failure ([place]) 02/05/2013  . Dehydration 09/12/2012  . Diabetes mellitus ([place]) 09/12/2012  . Hypertension 09/12/2012  . Hypothyroidism 09/12/2012  .  Hyperlipidemia 09/12/2012   Kerin Perna, PTA 10/19/2015 5:08 PM  [place], [place], [address] Phone: [phone]   Fax:  [phone]  Name: Abigail [name] MRN: [id number] Date of Birth: [birth year]

## 2015-10-20 DIAGNOSIS — Z794 Long term (current) use of insulin: Secondary | ICD-10-CM | POA: Diagnosis not present

## 2015-10-20 DIAGNOSIS — E119 Type 2 diabetes mellitus without complications: Secondary | ICD-10-CM | POA: Diagnosis not present

## 2015-10-22 ENCOUNTER — Ambulatory Visit (INDEPENDENT_AMBULATORY_CARE_PROVIDER_SITE_OTHER): Payer: Medicare Other | Admitting: Physical Therapy

## 2015-10-22 DIAGNOSIS — M6281 Muscle weakness (generalized): Secondary | ICD-10-CM

## 2015-10-22 DIAGNOSIS — M5441 Lumbago with sciatica, right side: Secondary | ICD-10-CM | POA: Diagnosis not present

## 2015-10-22 DIAGNOSIS — M5442 Lumbago with sciatica, left side: Secondary | ICD-10-CM | POA: Diagnosis not present

## 2015-10-22 NOTE — Therapy (Signed)
[place], [place], [address] Phone: [phone]   Fax:  [phone]  Physical Therapy Treatment  Patient Details  Name: Abigail [name] MRN: [id number] Date of Birth: 1941/02/20 Referring Provider: Dr. Vickie Epley   Encounter Date: 10/22/2015      PT End of Session - 10/22/15 1620    Visit Number 7   Number of Visits 12   Date for PT Re-Evaluation 11/11/15   PT Start Time 1638   PT Stop Time 1713   PT Time Calculation (min) 52 min   Activity Tolerance Patient tolerated treatment well      Past Medical History  Diagnosis Date  . CAD (coronary artery disease)   . Obesity   . Fibromyalgia   . Family hx of colon cancer   . Depression   . Hypertension   . Obesity   . GERD (gastroesophageal reflux disease)     pt denies  . PONV (postoperative nausea and vomiting)     with stapedectomy  . Torn rotator cuff left    current  . Heart murmur   . Diabetes mellitus     Type 2  . Frequency of urination   . Constipation due to pain medication   . Boils     near vaginal area  . Hyperlipidemia   . Itchy eyes   . History of bladder infections   . Frequent urination at night   . Chronic kidney disease     stage 3; low kidney function; BUN was high acc to pt  . Hypothyroidism     Past Surgical History  Procedure Laterality Date  . Dilation and curettage of uterus    . Stapedectomy Left     ear  . Shoulder surgery      bilateral rotator cuff repairs  . Cholecystectomy  1971  . Tubal ligation  1971  . Colonoscopy    . Maximum access (mas)posterior lumbar interbody fusion (plif) 1 level N/A 11/07/2014    Procedure: Lumbar Four-Five Maximum access posterior lumbar interbody fusion;  Surgeon: Erline Levine, MD;  Location: [facility];  Service: Neurosurgery;  Laterality: N/A;  L4-5 Maximum access posterior lumbar interbody fusion  . Back surgery      lumbar fusion    There were no vitals  filed for this visit.      Subjective Assessment - 10/22/15 1621    Subjective Abigail [name] was very busy today running errands and had a lunch to go to and her back lets her know it. Hasn't noticed any differnce in her low back pain with her new mattress. Saw a nutritionist yesterday.    Currently in Pain? Yes   Pain Score 2   up tp 4/10 with her errands today.    Pain Location Back   Pain Orientation Lower   Pain Descriptors / Indicators Aching   Pain Type Chronic pain   Pain Frequency Constant   Aggravating Factors  standing   Pain Relieving Factors heat, lying down                         [facility] Adult PT Treatment/Exercise - 10/22/15 0001    Exercises   Exercises Lumbar;Knee/Hip   Lumbar Exercises: Stretches   Lower Trunk Rotation 5 reps;10 seconds  to each side.    Piriformis Stretch 2 reps;20 seconds  each side.    Lumbar Exercises: Aerobic   Stationary Bike NuStep: L4 x  6 min (arms and legs)    Lumbar Exercises: Supine   Ab Set 15 reps   Bent Knee Raise 20 reps   Bridge 20 reps  attempted to hold and perform knees in/out to hard for pt.    Lumbar Exercises: Sidelying   Other Sidelying Lumbar Exercises 2x10 upper body rotations with straight arms, abd bracing.    Modalities   Modalities Electrical Stimulation;Moist Heat   Moist Heat Therapy   Number Minutes Moist Heat 15 Minutes   Moist Heat Location Lumbar Spine;Cervical   Electrical Stimulation   Electrical Stimulation Location lumbar    Electrical Stimulation Action IFC   Electrical Stimulation Parameters  to tolerance   Electrical Stimulation Goals Pain                     PT Long Term Goals - 10/22/15 1627    PT LONG TERM GOAL #1   Title Demonstrate good body mechanics getting in/out of bed consistently (11/11/15)    Status On-going   PT LONG TERM GOAL #2   Title Be independent with advanced HEP (11/11/15)    Status On-going   PT LONG TERM GOAL #3   Title Report pain decrease  =/>50% in her low back with daily activity ( 11/11/15)    Status On-going  patient her pain has decreased some overall, varies day to day on amount.    PT LONG TERM GOAL #4   Title demo strong contraction of TA and multifidi with core ther ex ( 11/11/15)    Status On-going   PT LONG TERM GOAL #5   Title Improve FOTO =/< 36% limited (CJ level) (11/11/15)    Status On-going               Plan - 10/22/15 1649    Clinical Impression Statement Abigail [name] is making slow progress with strengthening and pain reduction. No new goals met today.    Rehab Potential Good   PT Frequency 2x / week   PT Duration 6 weeks   PT Treatment/Interventions Ultrasound;Neuromuscular re-education;Patient/family education;Dry needling;Cryotherapy;Electrical Stimulation;Moist Heat;Therapeutic exercise;Manual techniques;Taping   PT Next Visit Plan progress core ther ex, Rt knee strengthening/ stretching, modalities PRN.    Consulted and Agree with Plan of Care Patient      Patient will benefit from skilled therapeutic intervention in order to improve the following deficits and impairments:  Postural dysfunction, Decreased strength, Pain, Increased muscle spasms, Difficulty walking  Visit Diagnosis: Muscle weakness (generalized)  Lumbago with sciatica, left side  Lumbago with sciatica, right side     Problem List Patient Active Problem List   Diagnosis Date Noted  . Pressure ulcer 11/10/2014  . Spondylolisthesis of lumbar region 11/07/2014  . Acute encephalopathy 02/05/2013  . Anemia 02/05/2013  . Acute renal failure ([place]) 02/05/2013  . Dehydration 09/12/2012  . Diabetes mellitus ([place]) 09/12/2012  . Hypertension 09/12/2012  . Hypothyroidism 09/12/2012  . Hyperlipidemia 09/12/2012    Jeral Pinch PT  10/22/2015, 5:00 PM  [place], [place], [address] Phone: [phone]   Fax:  [phone]  Name: Abigail [name] MRN: [id number] Date of Birth: 1940-08-28

## 2015-10-28 ENCOUNTER — Ambulatory Visit (INDEPENDENT_AMBULATORY_CARE_PROVIDER_SITE_OTHER): Payer: Medicare Other | Admitting: Physical Therapy

## 2015-10-28 DIAGNOSIS — M6281 Muscle weakness (generalized): Secondary | ICD-10-CM | POA: Diagnosis present

## 2015-10-28 DIAGNOSIS — M5442 Lumbago with sciatica, left side: Secondary | ICD-10-CM

## 2015-10-28 DIAGNOSIS — M5441 Lumbago with sciatica, right side: Secondary | ICD-10-CM

## 2015-10-28 NOTE — Therapy (Signed)
[place], [place], [address] Phone: [phone]   Fax:  [phone]  Physical Therapy Treatment  Patient Details  Name: Abigail [name] MRN: [id number] Date of Birth: June 21, 1940 Referring Provider: Dr. Vickie [name]  Encounter Date: 10/28/2015      PT End of Session - 10/28/15 1000    Visit Number 8   Number of Visits 12   Date for PT Re-Evaluation 11/11/15   PT Start Time 0930   PT Stop Time 1031   PT Time Calculation (min) 61 min   Activity Tolerance Patient tolerated treatment well      Past Medical History  Diagnosis Date  . CAD (coronary artery disease)   . Obesity   . Fibromyalgia   . Family hx of colon cancer   . Depression   . Hypertension   . Obesity   . GERD (gastroesophageal reflux disease)     pt denies  . PONV (postoperative nausea and vomiting)     with stapedectomy  . Torn rotator cuff left    current  . Heart murmur   . Diabetes mellitus     Type 2  . Frequency of urination   . Constipation due to pain medication   . Boils     near vaginal area  . Hyperlipidemia   . Itchy eyes   . History of bladder infections   . Frequent urination at night   . Chronic kidney disease     stage 3; low kidney function; BUN was high acc to pt  . Hypothyroidism     Past Surgical History  Procedure Laterality Date  . Dilation and curettage of uterus    . Stapedectomy Left     ear  . Shoulder surgery      bilateral rotator cuff repairs  . Cholecystectomy  1971  . Tubal ligation  1971  . Colonoscopy    . Maximum access (mas)posterior lumbar interbody fusion (plif) 1 level N/A 11/07/2014    Procedure: Lumbar Four-Five Maximum access posterior lumbar interbody fusion;  Surgeon: Erline Levine, MD;  Location: [facility];  Service: Neurosurgery;  Laterality: N/A;  L4-5 Maximum access posterior lumbar interbody fusion  . Back surgery      lumbar fusion    There were no vitals filed  for this visit.      Subjective Assessment - 10/28/15 0956    Subjective Pt reports she feels pretty good today, but it is early in the day (normally her pain increases as day goes on).  Abigail [name] feels she is getting stronger overall, especially in legs. She is now attending yoga 2-3x/wk, which she feels is helping.    Currently in Pain? Yes   Pain Score 3    Pain Location Knee   Pain Orientation Right   Pain Descriptors / Indicators Sore   Aggravating Factors  moving it   Pain Relieving Factors resting, heat   Pain Score 0   Pain Location Back   Pain Orientation Lower            [facility] PT Assessment - 10/28/15 0001    Assessment   Medical Diagnosis Lumbar Spondylolisthesis   Referring Provider Dr. Vickie [name]   Onset Date/Surgical Date 07/03/15   Next MD Visit 11/23/15   Prior Therapy last year for her shoulder   Strength   Right Hip Extension 5/5   Right Hip ABduction --  5-/5   Left Hip Extension 5/5  Left Hip ABduction --  5-/5   Flexibility   Hamstrings Rt 72, Lt 70          [facility] Adult PT Treatment/Exercise - 10/28/15 0001    Lumbar Exercises: Stretches   Passive Hamstring Stretch 2 reps;30 seconds  with strap   Quad Stretch 2 reps;30 seconds  with strap   Lumbar Exercises: Aerobic   Stationary Bike NuStep: L4 x 6 min (arms and legs)    Lumbar Exercises: Supine   Ab Set 5 seconds;10 reps   Bent Knee Raise 20 reps   Bent Knee Raise Limitations (opposite arm /leg marching)    Bridge 10 reps  2 sets   Modalities   Modalities Moist Heat;Electrical Stimulation   Moist Heat Therapy   Number Minutes Moist Heat 15 Minutes   Moist Heat Location Knee;Cervical;Lumbar Spine   Electrical Stimulation   Electrical Stimulation Location lumbar paraspinals.    Electrical Stimulation Action IFC   Electrical Stimulation Parameters to tolerance    Electrical Stimulation Goals Pain   Manual Therapy   Manual Therapy Soft tissue mobilization   Manual therapy comments pt  prone   Soft tissue mobilization deep pressure to Rt/ Lt glute with passive ER/IR of hip, to tolerance                      PT Long Term Goals - 10/22/15 1627    PT LONG TERM GOAL #1   Title Demonstrate good body mechanics getting in/out of bed consistently (11/11/15)    Status On-going   PT LONG TERM GOAL #2   Title Be independent with advanced HEP (11/11/15)    Status On-going   PT LONG TERM GOAL #3   Title Report pain decrease =/>50% in her low back with daily activity ( 11/11/15)    Status On-going  patient her pain has decreased some overall, varies day to day on amount.    PT LONG TERM GOAL #4   Title demo strong contraction of TA and multifidi with core ther ex ( 11/11/15)    Status On-going   PT LONG TERM GOAL #5   Title Improve FOTO =/< 36% limited (CJ level) (11/11/15)    Status On-going               Plan - 10/28/15 0953    Clinical Impression Statement Pt demonstrated improved hip strength.  Continues to have difficulty initiating and sustaining strong TA contraction with exercise. Making gradual progress towards established goals.    Rehab Potential Good   PT Frequency 2x / week   PT Duration 6 weeks   PT Treatment/Interventions Ultrasound;Neuromuscular re-education;Patient/family education;Dry needling;Cryotherapy;Electrical Stimulation;Moist Heat;Therapeutic exercise;Manual techniques;Taping   PT Next Visit Plan progress core ther ex, Rt knee strengthening/ stretching, modalities PRN.    Consulted and Agree with Plan of Care Patient      Patient will benefit from skilled therapeutic intervention in order to improve the following deficits and impairments:  Postural dysfunction, Decreased strength, Pain, Increased muscle spasms, Difficulty walking  Visit Diagnosis: Muscle weakness (generalized)  Lumbago with sciatica, left side  Lumbago with sciatica, right side     Problem List Patient Active Problem List   Diagnosis Date Noted  .  Pressure ulcer 11/10/2014  . Spondylolisthesis of lumbar region 11/07/2014  . Acute encephalopathy 02/05/2013  . Anemia 02/05/2013  . Acute renal failure ([place]) 02/05/2013  . Dehydration 09/12/2012  . Diabetes mellitus ([place]) 09/12/2012  . Hypertension 09/12/2012  . Hypothyroidism 09/12/2012  .  Hyperlipidemia 09/12/2012   Kerin Perna, PTA 10/28/2015 12:47 PM  [place], [place], [address] Phone: [phone]   Fax:  [phone]  Name: Abigail [name] MRN: [id number] Date of Birth: Feb 27, 1941

## 2015-11-02 ENCOUNTER — Ambulatory Visit (INDEPENDENT_AMBULATORY_CARE_PROVIDER_SITE_OTHER): Payer: Medicare Other | Admitting: Physical Therapy

## 2015-11-02 DIAGNOSIS — M5441 Lumbago with sciatica, right side: Secondary | ICD-10-CM | POA: Diagnosis not present

## 2015-11-02 DIAGNOSIS — M5442 Lumbago with sciatica, left side: Secondary | ICD-10-CM

## 2015-11-02 DIAGNOSIS — M6281 Muscle weakness (generalized): Secondary | ICD-10-CM

## 2015-11-02 NOTE — Therapy (Signed)
[place], [place], [address] Phone: [phone]   Fax:  [phone]  Physical Therapy Treatment  Patient Details  Name: Abigail [name] MRN: [id number] Date of Birth: 02-Jun-1940 Referring Provider: Dr. Vickie Epley   Encounter Date: 11/02/2015      PT End of Session - 11/02/15 1441    Visit Number 9   Number of Visits 12   Date for PT Re-Evaluation 11/11/15   PT Start Time [id number]   PT Stop Time 1531   PT Time Calculation (min) 58 min      Past Medical History  Diagnosis Date  . CAD (coronary artery disease)   . Obesity   . Fibromyalgia   . Family hx of colon cancer   . Depression   . Hypertension   . Obesity   . GERD (gastroesophageal reflux disease)     pt denies  . PONV (postoperative nausea and vomiting)     with stapedectomy  . Torn rotator cuff left    current  . Heart murmur   . Diabetes mellitus     Type 2  . Frequency of urination   . Constipation due to pain medication   . Boils     near vaginal area  . Hyperlipidemia   . Itchy eyes   . History of bladder infections   . Frequent urination at night   . Chronic kidney disease     stage 3; low kidney function; BUN was high acc to pt  . Hypothyroidism     Past Surgical History  Procedure Laterality Date  . Dilation and curettage of uterus    . Stapedectomy Left     ear  . Shoulder surgery      bilateral rotator cuff repairs  . Cholecystectomy  1971  . Tubal ligation  1971  . Colonoscopy    . Maximum access (mas)posterior lumbar interbody fusion (plif) 1 level N/A 11/07/2014    Procedure: Lumbar Four-Five Maximum access posterior lumbar interbody fusion;  Surgeon: Erline Levine, MD;  Location: [facility];  Service: Neurosurgery;  Laterality: N/A;  L4-5 Maximum access posterior lumbar interbody fusion  . Back surgery      lumbar fusion    There were no vitals filed for this visit.      Subjective Assessment -  11/02/15 1441    Subjective Pt reports she may have overdid it at yoga last week.  So, then she did not go to water aerobics or yoga again rest of wk.  She has taken Tramadol frequently for last 3 days.     Patient Stated Goals strengthen her back and help decrease her pain   Currently in Pain? Yes   Pain Score 3   with Tramadol    Pain Location Back   Pain Orientation Right;Left            [facility] PT Assessment - 11/02/15 0001    Assessment   Medical Diagnosis Lumbar Spondylolisthesis   Referring Provider Dr. Vickie Epley    Onset Date/Surgical Date 07/03/15   Next MD Visit 11/23/15   Prior Therapy last year for her shoulder         [facility] Adult PT Treatment/Exercise - 11/02/15 0001    Lumbar Exercises: Stretches   Passive Hamstring Stretch 2 reps;60 seconds  with strap   Lumbar Exercises: Aerobic   Stationary Bike NuStep: L4 x 7 min (arms and legs)    Lumbar Exercises: Supine  Bridge --  2 reps   Bridge Limitations unable to tolerated due to increased pain   Straight Leg Raise 10 reps  RLE, with ER. 2 sets   Lumbar Exercises: Sidelying   Clam 10 reps  2 sets, each side   Hip Abduction 10 reps  each side   Lumbar Exercises: Prone   Straight Leg Raise 10 reps  each leg, 2 sets   Moist Heat Therapy   Number Minutes Moist Heat 15 Minutes   Moist Heat Location Knee;Cervical;Lumbar Spine   Electrical Stimulation   Electrical Stimulation Location lumbar paraspinals.    Electrical Stimulation Action IFC   Electrical Stimulation Parameters to tolerance    Electrical Stimulation Goals Pain   Manual Therapy   Manual Therapy Soft tissue mobilization   Manual therapy comments pt prone   Soft tissue mobilization to Rt/ Lt glute and lumbar paraspinals, to tolerance                      PT Long Term Goals - 10/22/15 1627    PT LONG TERM GOAL #1   Title Demonstrate good body mechanics getting in/out of bed consistently (11/11/15)    Status On-going   PT LONG TERM  GOAL #2   Title Be independent with advanced HEP (11/11/15)    Status On-going   PT LONG TERM GOAL #3   Title Report pain decrease =/>50% in her low back with daily activity ( 11/11/15)    Status On-going  patient her pain has decreased some overall, varies day to day on amount.    PT LONG TERM GOAL #4   Title demo strong contraction of TA and multifidi with core ther ex ( 11/11/15)    Status On-going   PT LONG TERM GOAL #5   Title Improve FOTO =/< 36% limited (CJ level) (11/11/15)    Status On-going               Plan - 11/02/15 1656    Clinical Impression Statement Pt with decreased tolerance for exercise due to recent flare. Pt reported decreased pain with stretches and use of MHP and estim at end of session.  making gradual progress towards established goals.    Rehab Potential Good   PT Frequency 2x / week   PT Duration 6 weeks   PT Treatment/Interventions Ultrasound;Neuromuscular re-education;Patient/family education;Dry needling;Cryotherapy;Electrical Stimulation;Moist Heat;Therapeutic exercise;Manual techniques;Taping   PT Next Visit Plan Assess goals and progress.  FOTO - 10 th visit.       Patient will benefit from skilled therapeutic intervention in order to improve the following deficits and impairments:  Postural dysfunction, Decreased strength, Pain, Increased muscle spasms, Difficulty walking  Visit Diagnosis: Muscle weakness (generalized)  Lumbago with sciatica, left side  Lumbago with sciatica, right side     Problem List Patient Active Problem List   Diagnosis Date Noted  . Pressure ulcer 11/10/2014  . Spondylolisthesis of lumbar region 11/07/2014  . Acute encephalopathy 02/05/2013  . Anemia 02/05/2013  . Acute renal failure ([place]) 02/05/2013  . Dehydration 09/12/2012  . Diabetes mellitus ([place]) 09/12/2012  . Hypertension 09/12/2012  . Hypothyroidism 09/12/2012  . Hyperlipidemia 09/12/2012   Kerin Perna, PTA 11/02/2015 4:57 PM  [place], [place], [address] Phone: [phone]   Fax:  [phone]  Name: Abigail [name] MRN: [id number] Date of Birth: 1940/08/24

## 2015-11-05 ENCOUNTER — Ambulatory Visit (INDEPENDENT_AMBULATORY_CARE_PROVIDER_SITE_OTHER): Payer: Medicare Other | Admitting: Physical Therapy

## 2015-11-05 DIAGNOSIS — M5442 Lumbago with sciatica, left side: Secondary | ICD-10-CM | POA: Diagnosis not present

## 2015-11-05 DIAGNOSIS — M6281 Muscle weakness (generalized): Secondary | ICD-10-CM | POA: Diagnosis present

## 2015-11-05 DIAGNOSIS — M5441 Lumbago with sciatica, right side: Secondary | ICD-10-CM | POA: Diagnosis not present

## 2015-11-05 NOTE — Patient Instructions (Signed)

## 2015-11-05 NOTE — Therapy (Signed)
[facility] [place], [place], [address] Phone: [phone]   Fax:  [phone]  Physical Therapy Treatment  Patient Details  Name: Abigail [name] MRN: [id number] Date of Birth: [birth year] Referring Provider: Dr. Vickie Epley   Encounter Date: 11/05/2015      PT End of Session - 11/05/15 1617    Visit Number 10   Number of Visits 12   Date for PT Re-Evaluation 11/11/15   PT Start Time [id number]   PT Stop Time 1711   PT Time Calculation (min) 54 min   Activity Tolerance Patient tolerated treatment well      Past Medical History  Diagnosis Date  . CAD (coronary artery disease)   . Obesity   . Fibromyalgia   . Family hx of colon cancer   . Depression   . Hypertension   . Obesity   . GERD (gastroesophageal reflux disease)     pt denies  . PONV (postoperative nausea and vomiting)     with stapedectomy  . Torn rotator cuff left    current  . Heart murmur   . Diabetes mellitus     Type 2  . Frequency of urination   . Constipation due to pain medication   . Boils     near vaginal area  . Hyperlipidemia   . Itchy eyes   . History of bladder infections   . Frequent urination at night   . Chronic kidney disease     stage 3; low kidney function; BUN was high acc to pt  . Hypothyroidism     Past Surgical History  Procedure Laterality Date  . Dilation and curettage of uterus    . Stapedectomy Left     ear  . Shoulder surgery      bilateral rotator cuff repairs  . Cholecystectomy  1971  . Tubal ligation  1971  . Colonoscopy    . Maximum access (mas)posterior lumbar interbody fusion (plif) 1 level N/A 11/07/2014    Procedure: Lumbar Four-Five Maximum access posterior lumbar interbody fusion;  Surgeon: Erline Levine, MD;  Location: [facility];  Service: Neurosurgery;  Laterality: N/A;  L4-5 Maximum access posterior lumbar interbody fusion  . Back surgery      lumbar fusion    There were no vitals  filed for this visit.      Subjective Assessment - 11/05/15 1619    Subjective Pt asks about her knee brace, the one she had from [employer] slides down her leg, so she bought a patellar strap and wanted to try this with some tape    Currently in Pain? Yes   Pain Score 4    Pain Location Back   Pain Orientation Left   Pain Descriptors / Indicators Aching;Sore   Pain Type Chronic pain   Aggravating Factors  had the pain when she got up   Pain Relieving Factors heat            [facility] PT Assessment - 11/05/15 0001    Observation/Other Assessments   Focus on Therapeutic Outcomes (FOTO)  48% limited                     [facility] Adult PT Treatment/Exercise - 11/05/15 0001    Lumbar Exercises: Stretches   Single Knee to Chest Stretch 1 rep;30 seconds  each side   Lower Trunk Rotation 3 reps;20 seconds  to each side   Lumbar Exercises: Aerobic   Stationary  Bike NuStep: L5 x 5 min   Modalities   Modalities Electrical Stimulation;Moist Heat   Moist Heat Therapy   Number Minutes Moist Heat 15 Minutes   Moist Heat Location Knee;Cervical;Lumbar Spine   Electrical Stimulation   Electrical Stimulation Location Lt lumbar/buttocks   Electrical Stimulation Action IFC   Electrical Stimulation Parameters  to tolerance   Electrical Stimulation Goals Tone;Pain   Manual Therapy   Manual Therapy Taping  circle around Rt knee to support patella   Soft tissue mobilization Lt low back and buttocks.           Trigger Point Dry Needling - 11/05/15 1645    Consent Given? Yes   Education Handout Provided No   Muscles Treated Upper Body Longissimus;Quadratus Lumborum  Lt   Muscles Treated Lower Body Gluteus maximus   Longissimus Response Twitch response elicited;Palpable increased muscle length  L3-5   Gluteus Maximus Response Twitch response elicited;Palpable increased muscle length                   PT Long Term Goals - 10/22/15 1627    PT LONG TERM GOAL #1    Title Demonstrate good body mechanics getting in/out of bed consistently (11/11/15)    Status On-going   PT LONG TERM GOAL #2   Title Be independent with advanced HEP (11/11/15)    Status On-going   PT LONG TERM GOAL #3   Title Report pain decrease =/>50% in her low back with daily activity ( 11/11/15)    Status On-going  patient her pain has decreased some overall, varies day to day on amount.    PT LONG TERM GOAL #4   Title demo strong contraction of TA and multifidi with core ther ex ( 11/11/15)    Status On-going   PT LONG TERM GOAL #5   Title Improve FOTO =/< 36% limited (CJ level) (11/11/15)    Status On-going               Plan - 11/05/15 1628    Clinical Impression Statement Abigail [name] has had slight improvement per her FOTO score, still not at goal.  Her Rt knee along with her LBS interferes with her ability to walk.  Heat and stretches help her some along with medication .   Rehab Potential Good   PT Frequency 2x / week   PT Duration 6 weeks   PT Treatment/Interventions Ultrasound;Neuromuscular re-education;Patient/family education;Dry needling;Cryotherapy;Electrical Stimulation;Moist Heat;Therapeutic exercise;Manual techniques;Taping   PT Next Visit Plan assess response to TDN, Abigail [name] will be out of town for a week and then return for reassessment and possibly renewal    Consulted and Agree with Plan of Care Patient      Patient will benefit from skilled therapeutic intervention in order to improve the following deficits and impairments:  Postural dysfunction, Decreased strength, Pain, Increased muscle spasms, Difficulty walking  Visit Diagnosis: Muscle weakness (generalized)  Lumbago with sciatica, left side  Lumbago with sciatica, right side     Problem List Patient Active Problem List   Diagnosis Date Noted  . Pressure ulcer 11/10/2014  . Spondylolisthesis of lumbar region 11/07/2014  . Acute encephalopathy 02/05/2013  . Anemia 02/05/2013  . Acute renal  failure ([facility]) 02/05/2013  . Dehydration 09/12/2012  . Diabetes mellitus ([place]) 09/12/2012  . Hypertension 09/12/2012  . Hypothyroidism 09/12/2012  . Hyperlipidemia 09/12/2012    Abigail [name] PT  11/05/2015, 4:58 PM  [facility] [id number] [place], [place],  [place] Phone: [phone]   Fax:  [phone]  Name: Abigail [name] MRN: [id number] Date of Birth: 1940-09-18

## 2015-11-13 DIAGNOSIS — S90122A Contusion of left lesser toe(s) without damage to nail, initial encounter: Secondary | ICD-10-CM | POA: Diagnosis not present

## 2015-11-16 ENCOUNTER — Ambulatory Visit (INDEPENDENT_AMBULATORY_CARE_PROVIDER_SITE_OTHER): Payer: Medicare Other | Admitting: Physical Therapy

## 2015-11-16 DIAGNOSIS — M5442 Lumbago with sciatica, left side: Secondary | ICD-10-CM

## 2015-11-16 DIAGNOSIS — M6281 Muscle weakness (generalized): Secondary | ICD-10-CM | POA: Diagnosis present

## 2015-11-16 DIAGNOSIS — M5441 Lumbago with sciatica, right side: Secondary | ICD-10-CM
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
## 2015-11-19 ENCOUNTER — Encounter: Payer: Medicare Other | Admitting: Physical Therapy

## 2015-11-23 ENCOUNTER — Encounter: Payer: Medicare Other | Admitting: Physical Therapy

## 2015-11-26 ENCOUNTER — Encounter: Payer: Medicare Other | Admitting: Physical Therapy

## 2015-12-02 ENCOUNTER — Encounter: Payer: Medicare Other | Admitting: Physical Therapy

## 2015-12-04 ENCOUNTER — Encounter: Payer: Medicare Other | Admitting: Physical Therapy

## 2015-12-04 DIAGNOSIS — M1711 Unilateral primary osteoarthritis, right knee: Secondary | ICD-10-CM | POA: Diagnosis not present

## 2015-12-04 DIAGNOSIS — M25561 Pain in right knee: Secondary | ICD-10-CM | POA: Diagnosis not present

## 2015-12-31 DIAGNOSIS — G894 Chronic pain syndrome: Secondary | ICD-10-CM | POA: Diagnosis not present

## 2015-12-31 DIAGNOSIS — Z79891 Long term (current) use of opiate analgesic: Secondary | ICD-10-CM | POA: Diagnosis not present

## 2015-12-31 DIAGNOSIS — G5600 Carpal tunnel syndrome, unspecified upper limb: Secondary | ICD-10-CM | POA: Diagnosis not present

## 2015-12-31 DIAGNOSIS — Z79899 Other long term (current) drug therapy: Secondary | ICD-10-CM | POA: Diagnosis not present

## 2016-01-03 ENCOUNTER — Emergency Department (INDEPENDENT_AMBULATORY_CARE_PROVIDER_SITE_OTHER): Payer: Medicare Other

## 2016-01-03 ENCOUNTER — Emergency Department (INDEPENDENT_AMBULATORY_CARE_PROVIDER_SITE_OTHER)
Admission: EM | Admit: 2016-01-03 | Discharge: 2016-01-03 | Disposition: A | Discharge status: Home / Self Care | Payer: Medicare Other | Source: Home / Self Care | Attending: Emergency Medicine | Admitting: Emergency Medicine

## 2016-01-03 DIAGNOSIS — E119 Type 2 diabetes mellitus without complications: Secondary | ICD-10-CM

## 2016-01-03 DIAGNOSIS — R0789 Other chest pain: Secondary | ICD-10-CM

## 2016-01-03 DIAGNOSIS — R0781 Pleurodynia: Secondary | ICD-10-CM

## 2016-01-03 DIAGNOSIS — M25561 Pain in right knee: Secondary | ICD-10-CM | POA: Insufficient documentation

## 2016-01-03 DIAGNOSIS — S299XXA Unspecified injury of thorax, initial encounter: Secondary | ICD-10-CM | POA: Diagnosis not present

## 2016-01-03 LAB — GLUCOSE, RANDOM

## 2016-01-03 MED ORDER — HYDROCODONE-ACETAMINOPHEN 5-325 MG PO TABS: 2.0000 | ORAL_TABLET | ORAL | 0 refills | Status: DC | PRN
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
## 2016-01-03 NOTE — Discharge Instructions (Signed)
See your Physician for recheck next week  °

## 2016-01-07 DIAGNOSIS — J329 Chronic sinusitis, unspecified: Secondary | ICD-10-CM | POA: Diagnosis not present

## 2016-01-07 DIAGNOSIS — R0781 Pleurodynia: Secondary | ICD-10-CM | POA: Diagnosis not present

## 2016-01-08 DIAGNOSIS — Z794 Long term (current) use of insulin: Secondary | ICD-10-CM | POA: Diagnosis not present

## 2016-01-08 DIAGNOSIS — M797 Fibromyalgia: Secondary | ICD-10-CM | POA: Diagnosis not present

## 2016-01-08 DIAGNOSIS — M23331 Other meniscus derangements, other medial meniscus, right knee: Secondary | ICD-10-CM | POA: Diagnosis not present

## 2016-01-08 DIAGNOSIS — Z7982 Long term (current) use of aspirin: Secondary | ICD-10-CM | POA: Diagnosis not present

## 2016-01-08 DIAGNOSIS — M94261 Chondromalacia, right knee: Secondary | ICD-10-CM | POA: Diagnosis not present

## 2016-01-08 DIAGNOSIS — E785 Hyperlipidemia, unspecified: Secondary | ICD-10-CM | POA: Diagnosis not present

## 2016-01-08 DIAGNOSIS — Z86718 Personal history of other venous thrombosis and embolism: Secondary | ICD-10-CM | POA: Diagnosis not present

## 2016-01-08 DIAGNOSIS — E669 Obesity, unspecified: Secondary | ICD-10-CM | POA: Diagnosis not present

## 2016-01-08 DIAGNOSIS — Z880 Allergy status to penicillin: Secondary | ICD-10-CM | POA: Diagnosis not present

## 2016-01-08 DIAGNOSIS — Z8249 Family history of ischemic heart disease and other diseases of the circulatory system: Secondary | ICD-10-CM | POA: Diagnosis not present

## 2016-01-08 DIAGNOSIS — S83281A Other tear of lateral meniscus, current injury, right knee, initial encounter: Secondary | ICD-10-CM | POA: Diagnosis not present

## 2016-01-08 DIAGNOSIS — I129 Hypertensive chronic kidney disease with stage 1 through stage 4 chronic kidney disease, or unspecified chronic kidney disease: Secondary | ICD-10-CM | POA: Diagnosis not present

## 2016-01-08 DIAGNOSIS — E1122 Type 2 diabetes mellitus with diabetic chronic kidney disease: Secondary | ICD-10-CM | POA: Diagnosis not present

## 2016-01-08 DIAGNOSIS — Z833 Family history of diabetes mellitus: Secondary | ICD-10-CM | POA: Diagnosis not present

## 2016-01-08 DIAGNOSIS — M23361 Other meniscus derangements, other lateral meniscus, right knee: Secondary | ICD-10-CM | POA: Diagnosis not present

## 2016-01-08 DIAGNOSIS — Z7951 Long term (current) use of inhaled steroids: Secondary | ICD-10-CM | POA: Diagnosis not present

## 2016-01-08 DIAGNOSIS — N183 Chronic kidney disease, stage 3 (moderate): Secondary | ICD-10-CM | POA: Diagnosis not present

## 2016-01-08 DIAGNOSIS — M65861 Other synovitis and tenosynovitis, right lower leg: Secondary | ICD-10-CM | POA: Diagnosis not present

## 2016-01-08 DIAGNOSIS — Z809 Family history of malignant neoplasm, unspecified: Secondary | ICD-10-CM | POA: Diagnosis not present

## 2016-01-08 DIAGNOSIS — Z888 Allergy status to other drugs, medicaments and biological substances status: Secondary | ICD-10-CM | POA: Diagnosis not present

## 2016-01-08 DIAGNOSIS — E039 Hypothyroidism, unspecified: Secondary | ICD-10-CM | POA: Diagnosis not present

## 2016-01-08 DIAGNOSIS — S83241A Other tear of medial meniscus, current injury, right knee, initial encounter: Secondary | ICD-10-CM | POA: Diagnosis not present

## 2016-01-08 DIAGNOSIS — Z9049 Acquired absence of other specified parts of digestive tract: Secondary | ICD-10-CM | POA: Diagnosis not present

## 2016-01-08 DIAGNOSIS — M1711 Unilateral primary osteoarthritis, right knee: Secondary | ICD-10-CM | POA: Diagnosis not present

## 2016-02-22 DIAGNOSIS — H52203 Unspecified astigmatism, bilateral: Secondary | ICD-10-CM | POA: Diagnosis not present

## 2016-02-22 DIAGNOSIS — E119 Type 2 diabetes mellitus without complications: Secondary | ICD-10-CM | POA: Diagnosis not present

## 2016-02-22 DIAGNOSIS — H2513 Age-related nuclear cataract, bilateral: Secondary | ICD-10-CM | POA: Diagnosis not present

## 2016-02-22 DIAGNOSIS — D3132 Benign neoplasm of left choroid: Secondary | ICD-10-CM | POA: Diagnosis not present

## 2016-02-24 DIAGNOSIS — Z23 Encounter for immunization: Secondary | ICD-10-CM | POA: Diagnosis not present

## 2016-02-25 DIAGNOSIS — M4806 Spinal stenosis, lumbar region: Secondary | ICD-10-CM | POA: Diagnosis not present

## 2016-02-25 DIAGNOSIS — M25569 Pain in unspecified knee: Secondary | ICD-10-CM | POA: Diagnosis not present

## 2016-02-25 DIAGNOSIS — M7060 Trochanteric bursitis, unspecified hip: Secondary | ICD-10-CM | POA: Diagnosis not present

## 2016-02-25 DIAGNOSIS — Z79891 Long term (current) use of opiate analgesic: Secondary | ICD-10-CM | POA: Diagnosis not present

## 2016-02-25 DIAGNOSIS — G894 Chronic pain syndrome: Secondary | ICD-10-CM | POA: Diagnosis not present

## 2016-02-25 DIAGNOSIS — Z79899 Other long term (current) drug therapy: Secondary | ICD-10-CM | POA: Diagnosis not present

## 2016-03-03 DIAGNOSIS — R6889 Other general symptoms and signs: Secondary | ICD-10-CM | POA: Diagnosis not present

## 2016-03-03 DIAGNOSIS — M25561 Pain in right knee: Secondary | ICD-10-CM | POA: Diagnosis not present

## 2016-03-03 IMAGING — CT CT L SPINE W/ CM
3 of 10 series · 9 of 33 positions shown, 11 images · non-contrast
Comparison: Lumbar spine radiographs 02/10/2014. CT lumbar spine
10/11/2013.

CLINICAL DATA: Low back pain extending into the lower extremities
bilaterally, right greater than left.

EXAM:
LUMBAR MYELOGRAM
FLUOROSCOPY TIME:  dictate in minutes and seconds
PROCEDURE:
Lumbar puncture and intrathecal contrast administration were
performed by Dr. Bilvanidze who will separately report for the portion of
the procedure. I personally supervised acquisition of the myelogram
images.
TECHNIQUE: Contiguous axial images were obtained through the Lumbar spine after
the intrathecal infusion of infusion. Coronal and sagittal
reconstructions were obtained of the axial image sets.

[Series 3: l spine 2.0 i30s 3 · axial · 0.28mm/px · z∈[+950,+950]mm · 1 of 125 slices shown, 2 images]
[im 63/125  soft-tissue]
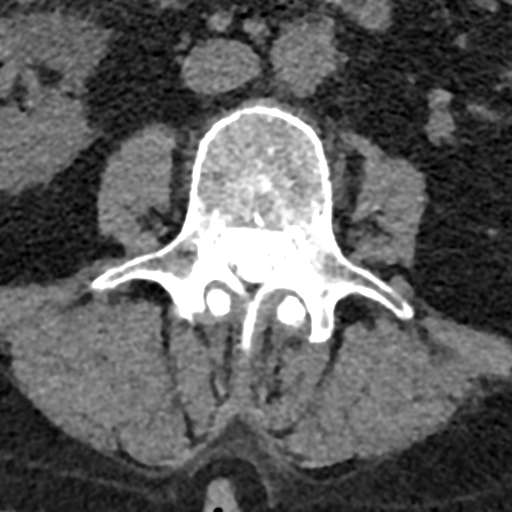
[im 63/125  bone]
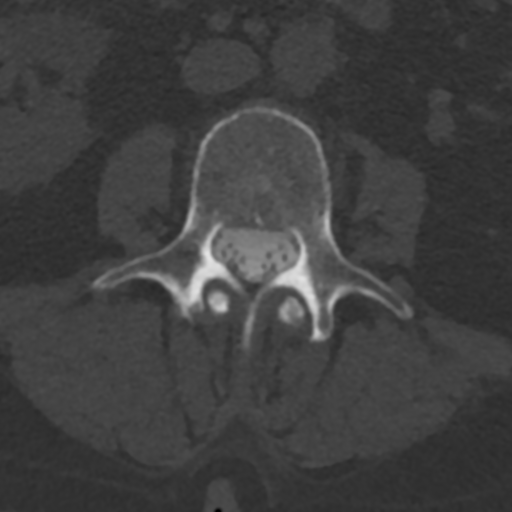

[Series 6: coronal st · coronal · 0.32mm/px · 3 of 60 slices shown]
[im 12/60  bone]
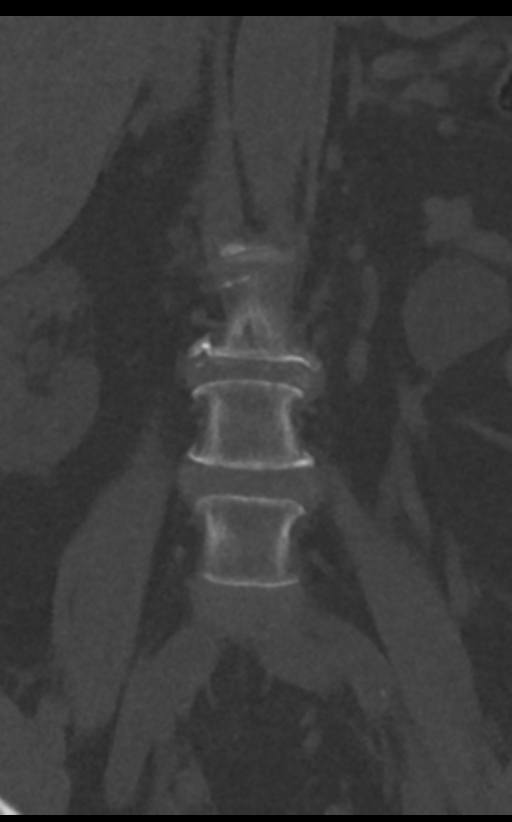
[im 24/60  bone]
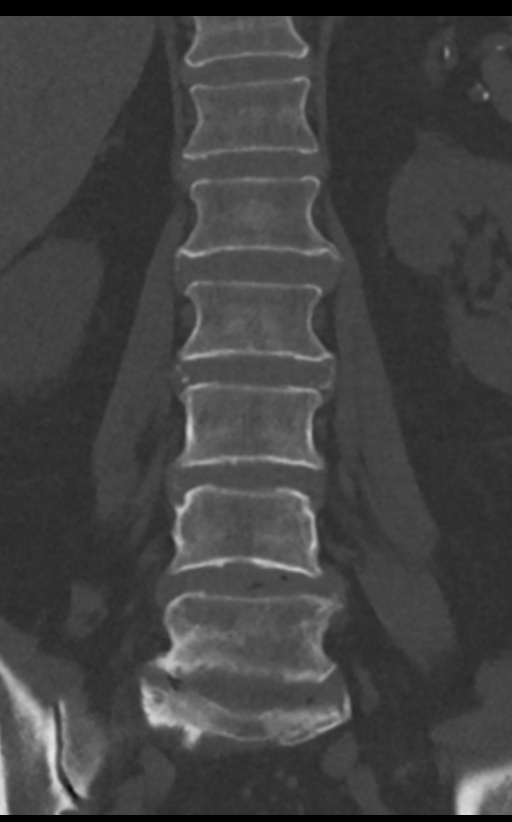
[im 36/60  bone]
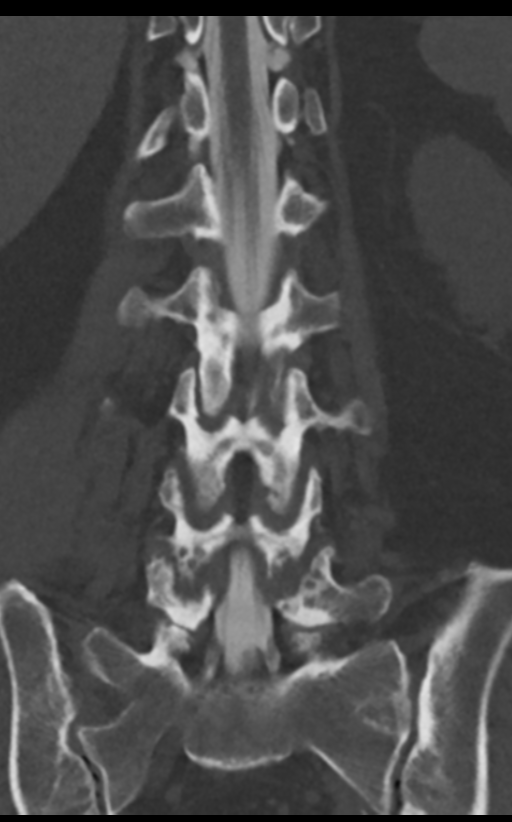

[Series 7: sagittal st · sagittal · 0.28mm/px · 5 of 59 slices shown, 6 images]
[im 20/59  bone]
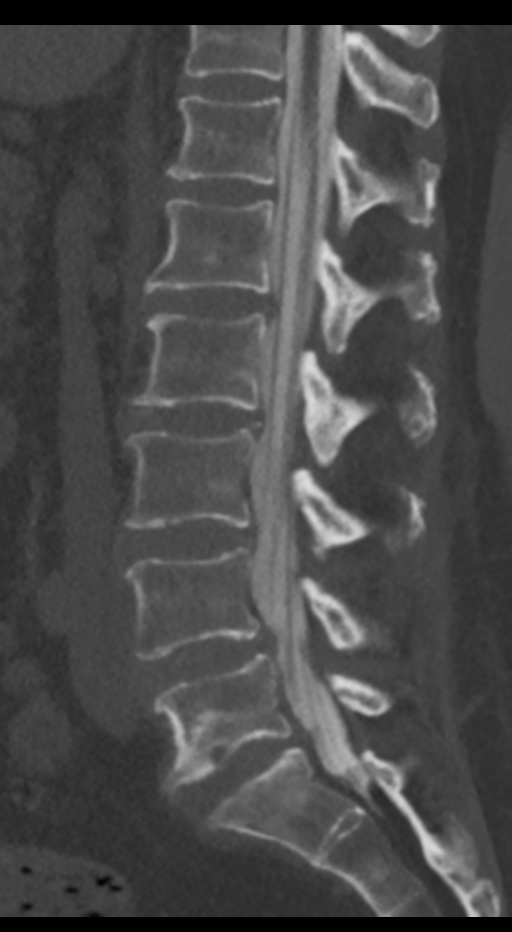
[im 25/59  bone]
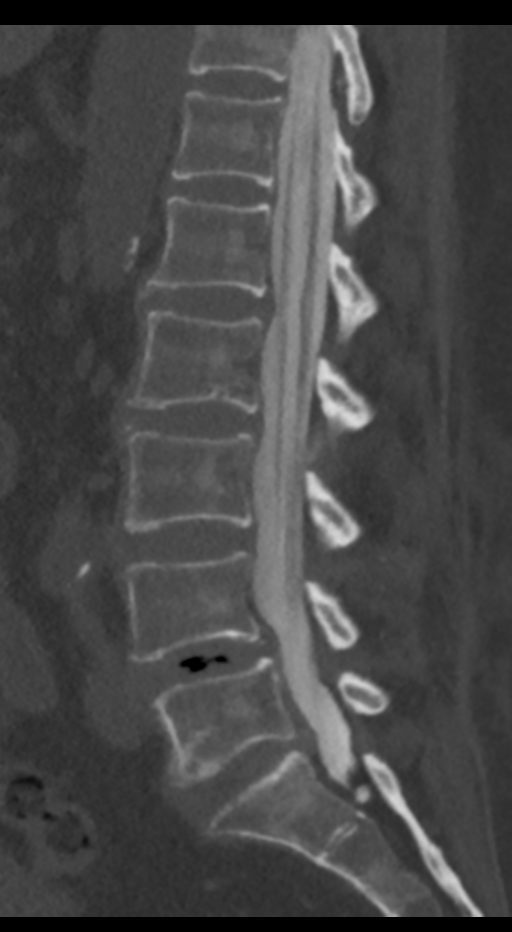
[im 30/59  soft-tissue]
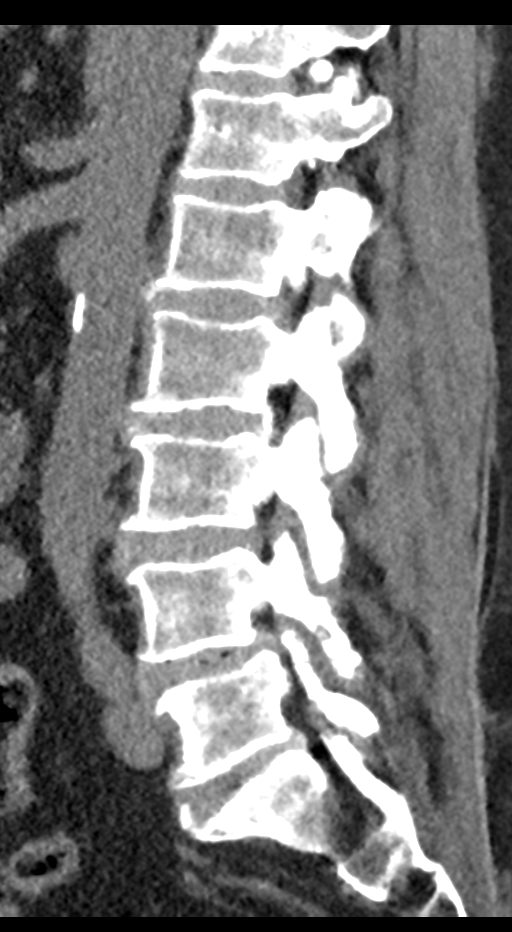
[im 30/59  bone]
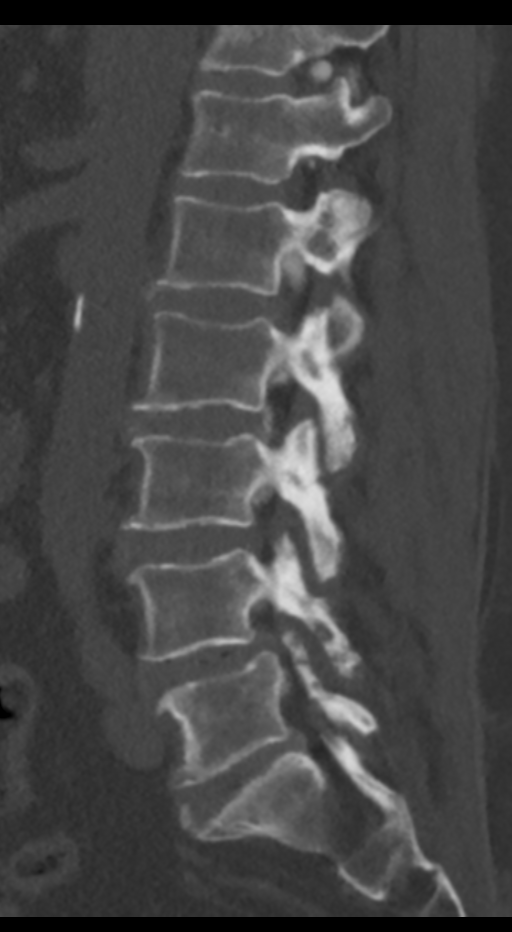
[im 34/59  bone]
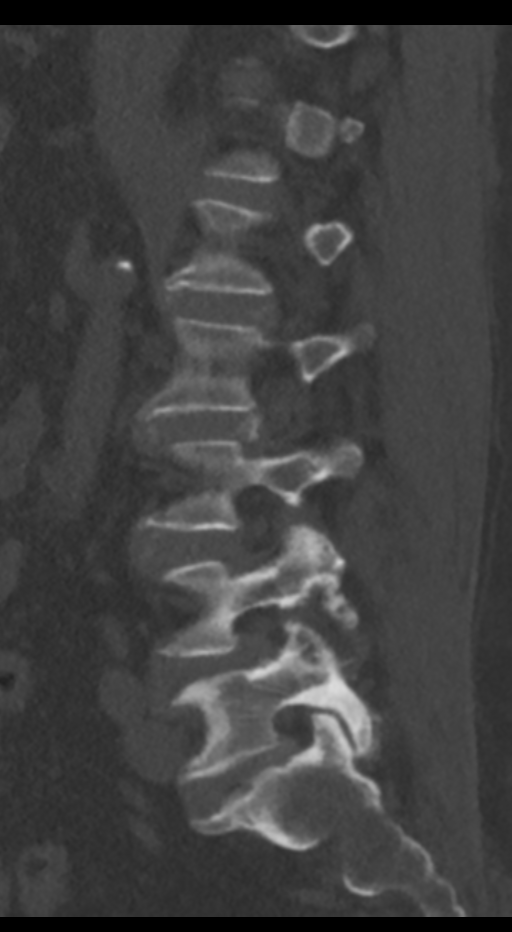
[im 39/59  bone]
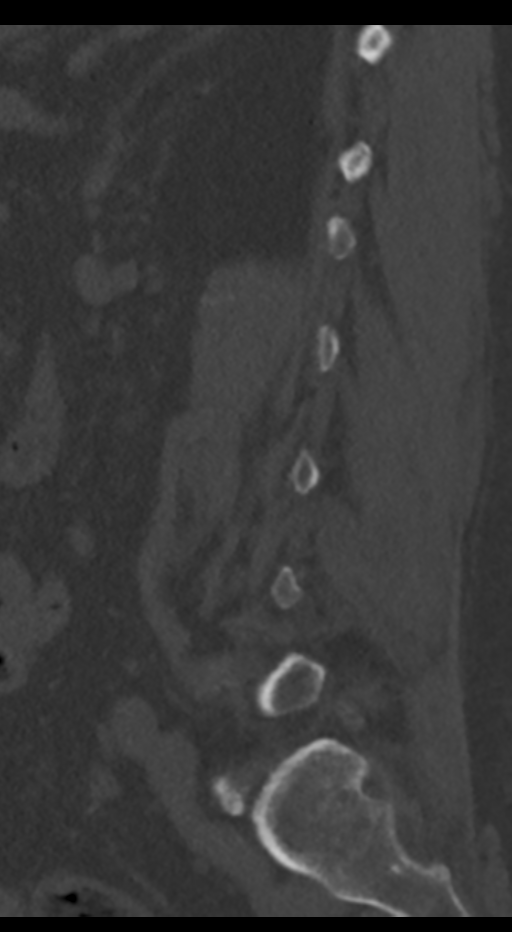

[9 of 33 positions shown; findings below may reference images not displayed]

FINDINGS: LUMBAR MYELOGRAM FINDINGS:

High 8 broad-based disc protrusion at L4-5 leads to moderate
subarticular stenosis bilaterally, left greater than right. The
nerve roots above and below this level fill normally. The perineural
root sleeve cyst is incidentally noted on the right at L3-4. Minimal
left subarticular narrowing is present at L3-4.

The lateral images demonstrate grade 1 anterolisthesis at L4-5
measuring 7 mm. Shallow disc protrusions are present at L1-2, L2-3,
and L3-4. The anterolisthesis is slightly exaggerated with standing
and flexion. There slight reduction and extension. Alignment is
otherwise stable. The disc protrusion at L3-4 is worse with
standing. Shallow disc protrusions at L1-2 and L2-3 are not
significantly changed.

CT LUMBAR MYELOGRAM FINDINGS:

The conus medullaris terminates at L1-2, within normal limits. Grade
1 anterolisthesis at L4-5 is diminished in the supine position,
measuring only 4 mm. There is a vacuum disc at the L4-5 level
compatible with motion.

Limited imaging the abdomen demonstrates mild atherosclerotic
calcifications. There is no significant aneurysm or stenosis.
Degenerative changes are noted in the SI joints bilaterally.

T12-L1: The perineural cyst is present on the right. There is no
significant disc protrusion or stenosis.

L1-2: Mild disc bulging is present. Bilateral perineural root sleeve
cysts are evident. There is no significant stenosis.

L2-3: A mild disc bulge is present. There is mild facet hypertrophy
and ligamentum flavum thickening bilaterally without significant
stenosis.

L3-4: A mild broad-based disc protrusion is present. A perineural
root sleeve cyst is present on the right. Mild facet hypertrophy and
ligamentum flavum thickening is evident.

L4-5: Advanced facet hypertrophy is present. Degenerative facet
changes are present bilaterally. There is a broad-based disc
protrusion with mild subarticular narrowing, worse on the left. Mild
foraminal narrowing is worse on the left as well.

L5-S1: A shallow leftward disc protrusion is present. There is mild
left subarticular narrowing. The foramina are patent
IMPRESSION: LUMBAR MYELOGRAM IMPRESSION:

1. Dynamic anterolisthesis at L4-5 with uncovering of a broad-based
disc protrusion resulting in moderate left and mild right
subarticular stenosis.
2. A broad-based disc protrusion at L3-4 is slightly worse with
standing. There is mild left subarticular stenosis at this level.
3. Mild disc protrusions at L1-2 and L2-3 are not significantly
changed with standing.

CT LUMBAR MYELOGRAM IMPRESSION:

1. Dynamic anterolisthesis at L4-5 is diminished in the supine
position.
2. Mild subarticular and foraminal narrowing bilaterally at L4-5 is
worse on the left.
3. Mild broad-based disc protrusion and facet hypertrophy at L3-4
without significant stenosis evident on the CT scan.
4. Shallow leftward disc protrusion at L5-S1 with mild left
subarticular narrowing.

## 2016-03-08 DIAGNOSIS — R6889 Other general symptoms and signs: Secondary | ICD-10-CM | POA: Diagnosis not present

## 2016-03-08 DIAGNOSIS — M25561 Pain in right knee: Secondary | ICD-10-CM | POA: Diagnosis not present

## 2016-03-11 DIAGNOSIS — R6889 Other general symptoms and signs: Secondary | ICD-10-CM | POA: Diagnosis not present

## 2016-03-11 DIAGNOSIS — M25561 Pain in right knee: Secondary | ICD-10-CM | POA: Diagnosis not present

## 2016-03-15 DIAGNOSIS — M25561 Pain in right knee: Secondary | ICD-10-CM | POA: Diagnosis not present

## 2016-03-15 DIAGNOSIS — R6889 Other general symptoms and signs: Secondary | ICD-10-CM | POA: Diagnosis not present

## 2016-04-04 DIAGNOSIS — I10 Essential (primary) hypertension: Secondary | ICD-10-CM | POA: Diagnosis not present

## 2016-04-04 DIAGNOSIS — M25569 Pain in unspecified knee: Secondary | ICD-10-CM | POA: Diagnosis not present

## 2016-04-04 DIAGNOSIS — Z6838 Body mass index (BMI) 38.0-38.9, adult: Secondary | ICD-10-CM | POA: Diagnosis not present

## 2016-04-04 DIAGNOSIS — Z794 Long term (current) use of insulin: Secondary | ICD-10-CM | POA: Diagnosis not present

## 2016-04-04 DIAGNOSIS — E1140 Type 2 diabetes mellitus with diabetic neuropathy, unspecified: Secondary | ICD-10-CM | POA: Diagnosis not present

## 2016-04-04 DIAGNOSIS — M1990 Unspecified osteoarthritis, unspecified site: Secondary | ICD-10-CM | POA: Diagnosis not present

## 2016-04-04 DIAGNOSIS — M79673 Pain in unspecified foot: Secondary | ICD-10-CM | POA: Diagnosis not present

## 2016-05-04 DIAGNOSIS — M545 Low back pain: Secondary | ICD-10-CM | POA: Diagnosis not present

## 2016-05-04 DIAGNOSIS — M961 Postlaminectomy syndrome, not elsewhere classified: Secondary | ICD-10-CM | POA: Diagnosis not present

## 2016-05-04 DIAGNOSIS — M791 Myalgia: Secondary | ICD-10-CM | POA: Diagnosis not present

## 2016-05-12 DIAGNOSIS — M5417 Radiculopathy, lumbosacral region: Secondary | ICD-10-CM | POA: Diagnosis not present

## 2016-06-01 DIAGNOSIS — M961 Postlaminectomy syndrome, not elsewhere classified: Secondary | ICD-10-CM | POA: Diagnosis not present

## 2016-06-01 DIAGNOSIS — M791 Myalgia: Secondary | ICD-10-CM | POA: Diagnosis not present

## 2016-06-01 DIAGNOSIS — G894 Chronic pain syndrome: Secondary | ICD-10-CM | POA: Diagnosis not present

## 2016-06-01 DIAGNOSIS — M79659 Pain in unspecified thigh: Secondary | ICD-10-CM | POA: Diagnosis not present

## 2016-06-05 IMAGING — RF DG C-ARM 61-120 MIN
1 series · 2 of 2 positions shown · non-contrast
Comparison: Lumbar spine CT myelogram 08/05/2014

CLINICAL DATA: Spondylolisthesis.  L4-5 fusion.

EXAM:
LUMBAR SPINE - 2-3 VIEW; DG C-ARM 61-120 MIN
FLUOROSCOPY TIME:  C-arm fluoroscopic images were obtained
intraoperatively and submitted for post operative interpretation.
Please see the performing provider's procedural report for the
fluoroscopy time utilized.

[Series 1: run · 2 of 2 slices shown]
[im 1/2]
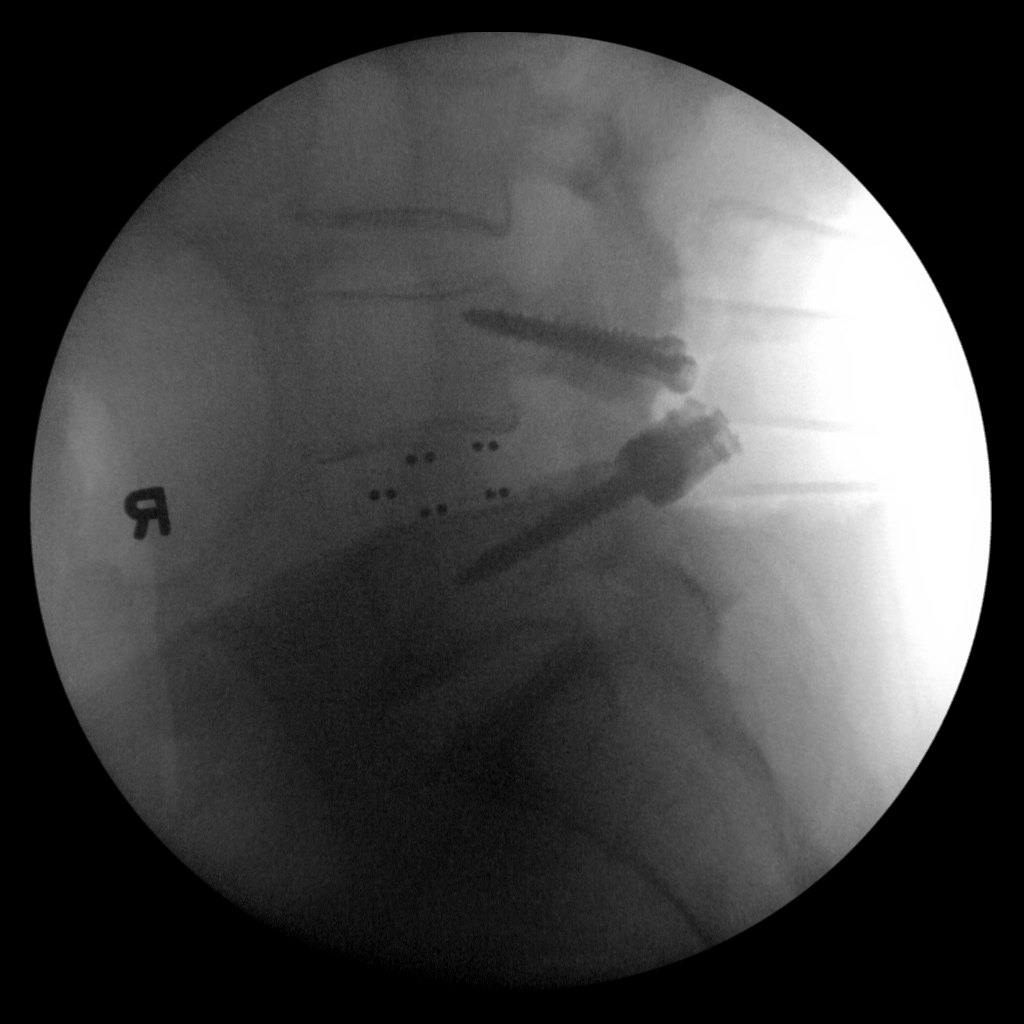
[im 2/2]
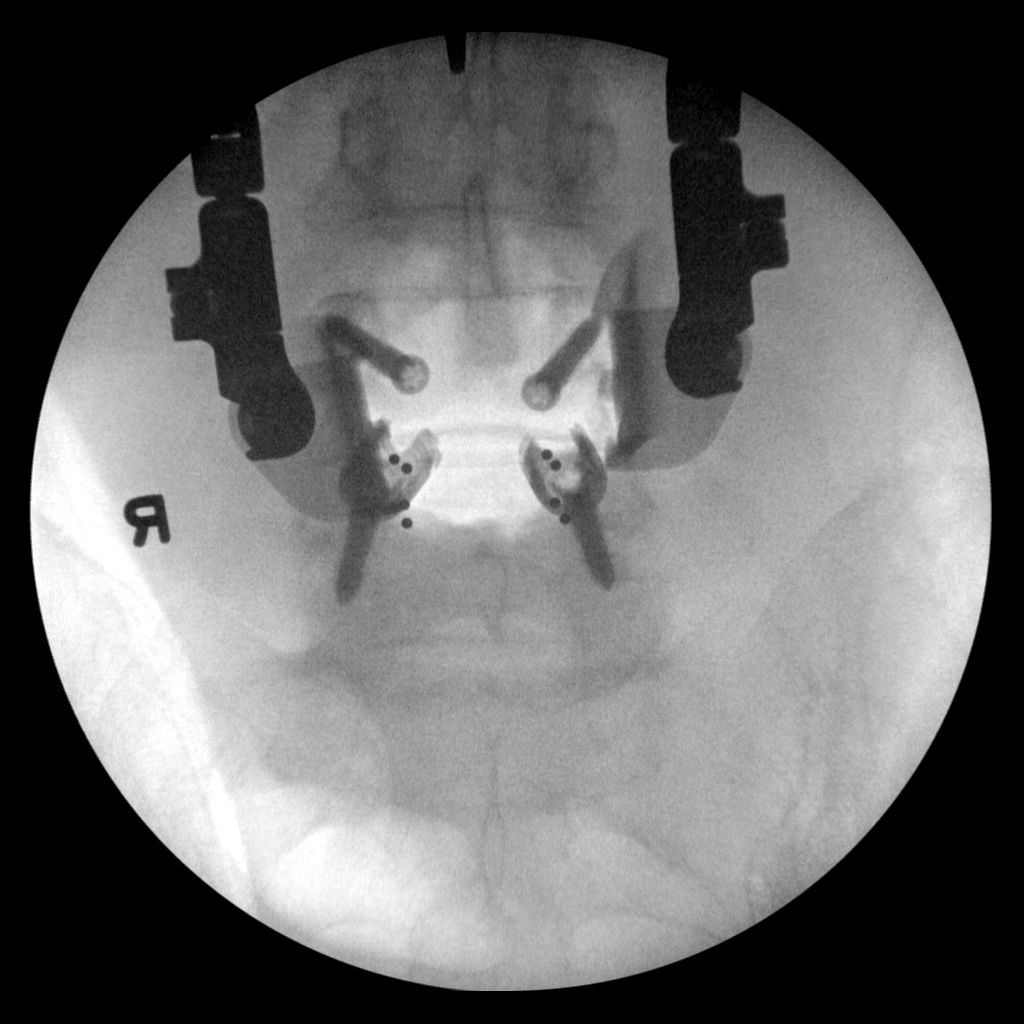

[2 of 2 positions shown; findings below may reference images not displayed]

FINDINGS: Frontal and lateral intraoperative spot fluoroscopic images of the
lower lumbar spine are provided. These demonstrate performance of
L4-5 fusion. Bilateral L4 and L5 pedicle screws are present, as well
as an L4-5 interbody spacer. Tissue retractors are present
posteriorly. Slight anterolisthesis is again seen of L4 on L5.
IMPRESSION: Intraoperative images during L4-5 fusion.

## 2016-07-06 DIAGNOSIS — G5600 Carpal tunnel syndrome, unspecified upper limb: Secondary | ICD-10-CM | POA: Diagnosis not present

## 2016-08-18 DIAGNOSIS — E1121 Type 2 diabetes mellitus with diabetic nephropathy: Secondary | ICD-10-CM | POA: Diagnosis not present

## 2016-08-18 DIAGNOSIS — I10 Essential (primary) hypertension: Secondary | ICD-10-CM | POA: Diagnosis not present

## 2016-08-18 DIAGNOSIS — N183 Chronic kidney disease, stage 3 (moderate): Secondary | ICD-10-CM | POA: Diagnosis not present

## 2016-08-25 DIAGNOSIS — N8110 Cystocele, unspecified: Secondary | ICD-10-CM | POA: Diagnosis not present

## 2016-08-25 DIAGNOSIS — Z1231 Encounter for screening mammogram for malignant neoplasm of breast: Secondary | ICD-10-CM | POA: Diagnosis not present

## 2016-08-25 DIAGNOSIS — N814 Uterovaginal prolapse, unspecified: Secondary | ICD-10-CM | POA: Diagnosis not present

## 2016-09-08 DIAGNOSIS — D485 Neoplasm of uncertain behavior of skin: Secondary | ICD-10-CM | POA: Diagnosis not present

## 2016-09-08 DIAGNOSIS — L570 Actinic keratosis: Secondary | ICD-10-CM | POA: Diagnosis not present

## 2016-09-08 DIAGNOSIS — L820 Inflamed seborrheic keratosis: Secondary | ICD-10-CM | POA: Diagnosis not present

## 2016-09-08 DIAGNOSIS — L821 Other seborrheic keratosis: Secondary | ICD-10-CM | POA: Diagnosis not present

## 2016-09-28 DIAGNOSIS — Z1211 Encounter for screening for malignant neoplasm of colon: Secondary | ICD-10-CM | POA: Diagnosis not present

## 2016-09-28 DIAGNOSIS — D123 Benign neoplasm of transverse colon: Secondary | ICD-10-CM | POA: Diagnosis not present

## 2016-09-28 DIAGNOSIS — K5730 Diverticulosis of large intestine without perforation or abscess without bleeding: Secondary | ICD-10-CM | POA: Diagnosis not present

## 2016-09-28 DIAGNOSIS — Z800 Family history of malignant neoplasm of digestive organs: Secondary | ICD-10-CM | POA: Diagnosis not present

## 2016-10-26 DIAGNOSIS — G5600 Carpal tunnel syndrome, unspecified upper limb: Secondary | ICD-10-CM | POA: Diagnosis not present

## 2016-10-26 DIAGNOSIS — F411 Generalized anxiety disorder: Secondary | ICD-10-CM | POA: Diagnosis not present

## 2016-10-26 DIAGNOSIS — E039 Hypothyroidism, unspecified: Secondary | ICD-10-CM | POA: Diagnosis not present

## 2016-10-26 DIAGNOSIS — M1990 Unspecified osteoarthritis, unspecified site: Secondary | ICD-10-CM | POA: Diagnosis not present

## 2016-10-26 DIAGNOSIS — Z0000 Encounter for general adult medical examination without abnormal findings: Secondary | ICD-10-CM | POA: Diagnosis not present

## 2016-10-26 DIAGNOSIS — I10 Essential (primary) hypertension: Secondary | ICD-10-CM | POA: Diagnosis not present

## 2016-10-26 DIAGNOSIS — E559 Vitamin D deficiency, unspecified: Secondary | ICD-10-CM | POA: Diagnosis not present

## 2016-10-26 DIAGNOSIS — E538 Deficiency of other specified B group vitamins: Secondary | ICD-10-CM | POA: Diagnosis not present

## 2016-10-26 DIAGNOSIS — E782 Mixed hyperlipidemia: Secondary | ICD-10-CM | POA: Diagnosis not present

## 2016-10-26 DIAGNOSIS — D510 Vitamin B12 deficiency anemia due to intrinsic factor deficiency: Secondary | ICD-10-CM | POA: Diagnosis not present

## 2016-10-26 DIAGNOSIS — E1140 Type 2 diabetes mellitus with diabetic neuropathy, unspecified: Secondary | ICD-10-CM | POA: Diagnosis not present

## 2016-11-07 ENCOUNTER — Encounter: Payer: Self-pay | Admitting: Emergency Medicine

## 2016-11-07 ENCOUNTER — Emergency Department (INDEPENDENT_AMBULATORY_CARE_PROVIDER_SITE_OTHER)
Admission: EM | Admit: 2016-11-07 | Discharge: 2016-11-07 | Disposition: A | Discharge status: Home / Self Care | Payer: Medicare Other | Source: Home / Self Care | Attending: Family Medicine | Admitting: Family Medicine

## 2016-11-07 DIAGNOSIS — T22212A Burn of second degree of left forearm, initial encounter: Secondary | ICD-10-CM

## 2016-11-07 MED ORDER — SILVER SULFADIAZINE 1 % EX CREA: 1.0000 "application " | TOPICAL_CREAM | Freq: Every day | CUTANEOUS | 0 refills | Status: DC

## 2016-11-07 MED ORDER — SILVER SULFADIAZINE 1 % EX CREA: TOPICAL_CREAM | CUTANEOUS | 0 refills | Status: DC

## 2016-11-07 NOTE — ED Triage Notes (Signed)
Left wrist burn x 6 days with a curling iron

## 2016-11-07 NOTE — ED Provider Notes (Signed)
CSN: [id number]     Arrival date & time 11/07/16  1438 History   First MD Initiated Contact with Patient 11/07/16 1501     Chief Complaint  Patient presents with  . Burn   (Consider location/radiation/quality/duration/timing/severity/associated sxs/prior Treatment) HPI Abigail [name] is a 76 y.o. female presenting to UC with c/o burn to her Left forearm that occurred about 6 days ago after trying to wrap up a curling iron.  Pain is burning, 4/10.  She notes there was a blister but it ruptured a few days ago.  She tried a small amount of silvadene cream on arm yesterday given to her by a friend at church but she was unsure if she should keep using it. She has not put any other medication on it. She was not sure if she should keep bandage on it or not.  Denies fever, chills, n/v/d.    Past Medical History:  Diagnosis Date  . Boils    near vaginal area  . CAD (coronary artery disease)   . Chronic kidney disease    stage 3; low kidney function; BUN was high acc to pt  . Constipation due to pain medication   . Depression   . Diabetes mellitus    Type 2  . Family hx of colon cancer   . Fibromyalgia   . Frequency of urination   . Frequent urination at night   . GERD (gastroesophageal reflux disease)    pt denies  . Heart murmur   . History of bladder infections   . Hyperlipidemia   . Hypertension   . Hypothyroidism   . Itchy eyes   . Obesity   . Obesity   . PONV (postoperative nausea and vomiting)    with stapedectomy  . Torn rotator cuff left   current   Past Surgical History:  Procedure Laterality Date  . BACK SURGERY     lumbar fusion  . CHOLECYSTECTOMY  1971  . COLONOSCOPY    . DILATION AND CURETTAGE OF UTERUS    . MAXIMUM ACCESS (MAS)POSTERIOR LUMBAR INTERBODY FUSION (PLIF) 1 LEVEL N/A 11/07/2014   Procedure: Lumbar Four-Five Maximum access posterior lumbar interbody fusion;  Surgeon: Erline Levine, MD;  Location: [facility];  Service: Neurosurgery;  Laterality: N/A;   L4-5 Maximum access posterior lumbar interbody fusion  . SHOULDER SURGERY     bilateral rotator cuff repairs  . STAPEDECTOMY Left    ear  . TUBAL LIGATION  1971   Family History  Problem Relation Age of Onset  . Colon cancer Brother   . Diabetes Mother        and Sister   . Uterine cancer Sister    Social History  Substance Use Topics  . Smoking status: Never Smoker  . Smokeless tobacco: Never Used  . Alcohol use No   OB History    No data available     Review of Systems  Constitutional: Negative for chills and fever.  Skin: Positive for color change and wound.    Allergies  Onglyza [saxagliptin]; Polysporin [bacitracin-polymyxin b]; Cephalosporins; Lisinopril; Nsaids; Penicillins; Victoza [liraglutide]; Corticotropin; Mucinex [guaifenesin er]; and Penicillin g  Home Medications   Prior to Admission medications   Medication Sig Start Date End Date Taking? Authorizing Provider  amitriptyline (ELAVIL) 10 MG tablet Take 30 mg by mouth at bedtime.    [provider]  amLODipine (NORVASC) 5 MG tablet Take 2.5 mg by mouth daily. 1/2 tablet daily in the morning for hypertension  [provider]  aspirin EC 81 MG tablet Take 81 mg by mouth every evening.    [provider]  B Complex-C (B-COMPLEX WITH VITAMIN C) tablet Take 1 tablet by mouth every evening.    [provider]  carvedilol (COREG) 6.25 MG tablet Take 3.125 mg by mouth 2 (two) times daily.     [provider]  Cranberry-Vitamin C-Probiotic (AZO CRANBERRY) 250-30 MG TABS Take 1 tablet by mouth at bedtime.     [provider]  Cyanocobalamin (VITAMIN B-12 IJ) Inject 1,000 mg as directed every 28 (twenty-eight) days.     [provider]  fluticasone (FLONASE) 50 MCG/ACT nasal spray Place 1 spray into the nose daily as needed for allergies.     [provider]  gabapentin (NEURONTIN) 400 MG capsule Take 1 capsule (400 mg total) by mouth 3 (three)  times daily. 02/06/13   Domenic Polite, MD  Ginkgo Biloba 60 MG CAPS Take 1 capsule by mouth daily.     [provider]  HYDROcodone-acetaminophen (NORCO/VICODIN) 5-325 MG tablet Take 2 tablets by mouth every 4 (four) hours as needed. 01/03/16   Fransico Meadow, PA-C  LEVEMIR FLEXTOUCH 100 UNIT/ML Pen Inject 50 Units into the skin every morning.  07/25/14   [provider]  levothyroxine (SYNTHROID, LEVOTHROID) 150 MCG tablet Take 150 mcg by mouth daily. 07/25/14   [provider]  losartan-hydrochlorothiazide (HYZAAR) 100-25 MG per tablet Take 1 tablet by mouth daily. For Hypertension    [provider]  Multiple Vitamin (MULTIVITAMIN) tablet Take 1 tablet by mouth every evening.     [provider]  mupirocin ointment (BACTROBAN) 2 % Apply 1 application topically daily as needed (use for cuts, etc).    [provider]  oxybutynin (DITROPAN-XL) 10 MG 24 hr tablet Take 10 mg by mouth at bedtime.     [provider]  polyethylene glycol (MIRALAX / GLYCOLAX) packet Take 17 g by mouth daily as needed for mild constipation.     [provider]  pravastatin (PRAVACHOL) 80 MG tablet Take 80 mg by mouth daily.    [provider]  Probiotic Product (PROBIOTIC DAILY PO) Take 1 capsule by mouth daily.    [provider]  sertraline (ZOLOFT) 100 MG tablet Take 150 mg by mouth every morning.     [provider]  silver sulfADIAZINE (SILVADENE) 1 % cream Apply thin layer to burn 1-2 time daily for 1 week, until burn is healed 11/07/16   Noland Fordyce, PA-C  traMADol (ULTRAM) 50 MG tablet Take 50 mg by mouth every 6 (six) hours as needed.     [provider]  Vitamin D, Ergocalciferol, (DRISDOL) 50000 UNITS CAPS Take 50,000 Units by mouth every 7 (seven) days. Wednesday 09/27/10   [provider]   Meds Ordered and Administered this Visit  Medications - No data to display  BP 119/72 (BP Location:  Left Arm)   Pulse 69   Temp 98.4 F (36.9 C) (Oral)   Ht 5' 2.5" (1.588 m)   Wt 221 lb (100.2 kg)   SpO2 95%   BMI 39.78 kg/m  No data found.   Physical Exam  Constitutional: She is oriented to person, place, and time. She appears well-developed and well-nourished.  HENT:  Head: Normocephalic and atraumatic.  Eyes: EOM are normal.  Neck: Normal range of motion.  Cardiovascular: Normal rate.   Pulmonary/Chest: Effort normal.  Musculoskeletal: Normal range of motion. She exhibits no  edema or tenderness.  Full ROM Left wrist and elbow. Muscle compartment is soft.   Neurological: She is alert and oriented to person, place, and time.  Skin: Skin is warm and dry.  Left forearm, distal ulnar aspect: 2cm well healing partial thickness burn.  Tenderness to touch. No bleeding, drainage or surrounding erythema. No edema.   Psychiatric: She has a normal mood and affect. Her behavior is normal.  Nursing note and vitals reviewed.   Urgent Care Course     Procedures (including critical care time)  Labs Review Labs Reviewed - No data to display  Imaging Review No results found.    MDM   1. Second degree burn of left forearm, initial encounter    Well healing second degree burn to Left forearm. No evidence of underlying infect.  Silvadene and bandage applied in UC  Rx: Silvadene Home care instructions provided. F/u with PCP in 1 week if not improving. Sooner if worsening.    Noland Fordyce, PA-C 11/07/16 [id number]

## 2016-11-14 DIAGNOSIS — M542 Cervicalgia: Secondary | ICD-10-CM | POA: Diagnosis not present

## 2016-11-14 DIAGNOSIS — G5603 Carpal tunnel syndrome, bilateral upper limbs: Secondary | ICD-10-CM | POA: Diagnosis not present

## 2016-11-14 DIAGNOSIS — M180 Bilateral primary osteoarthritis of first carpometacarpal joints: Secondary | ICD-10-CM | POA: Diagnosis not present

## 2016-11-15 DIAGNOSIS — G5603 Carpal tunnel syndrome, bilateral upper limbs: Secondary | ICD-10-CM | POA: Diagnosis not present

## 2016-11-18 DIAGNOSIS — R21 Rash and other nonspecific skin eruption: Secondary | ICD-10-CM | POA: Diagnosis not present

## 2016-11-18 DIAGNOSIS — M5412 Radiculopathy, cervical region: Secondary | ICD-10-CM | POA: Diagnosis not present

## 2016-11-21 DIAGNOSIS — M180 Bilateral primary osteoarthritis of first carpometacarpal joints: Secondary | ICD-10-CM | POA: Diagnosis not present

## 2016-11-21 DIAGNOSIS — M79644 Pain in right finger(s): Secondary | ICD-10-CM | POA: Diagnosis not present

## 2016-11-21 DIAGNOSIS — M79645 Pain in left finger(s): Secondary | ICD-10-CM | POA: Diagnosis not present

## 2016-11-23 DIAGNOSIS — G5603 Carpal tunnel syndrome, bilateral upper limbs: Secondary | ICD-10-CM | POA: Diagnosis not present

## 2016-11-23 DIAGNOSIS — M180 Bilateral primary osteoarthritis of first carpometacarpal joints: Secondary | ICD-10-CM | POA: Diagnosis not present

## 2016-11-23 DIAGNOSIS — M542 Cervicalgia: Secondary | ICD-10-CM | POA: Insufficient documentation

## 2016-11-27 DIAGNOSIS — M79644 Pain in right finger(s): Secondary | ICD-10-CM | POA: Diagnosis not present

## 2016-11-27 DIAGNOSIS — M180 Bilateral primary osteoarthritis of first carpometacarpal joints: Secondary | ICD-10-CM | POA: Diagnosis not present

## 2016-11-27 DIAGNOSIS — M79645 Pain in left finger(s): Secondary | ICD-10-CM | POA: Diagnosis not present

## 2016-11-29 DIAGNOSIS — M5417 Radiculopathy, lumbosacral region: Secondary | ICD-10-CM | POA: Diagnosis not present

## 2016-11-29 DIAGNOSIS — M5412 Radiculopathy, cervical region: Secondary | ICD-10-CM | POA: Diagnosis not present

## 2016-11-29 DIAGNOSIS — M542 Cervicalgia: Secondary | ICD-10-CM | POA: Diagnosis not present

## 2016-12-06 ENCOUNTER — Other Ambulatory Visit: Payer: Self-pay | Admitting: Anesthesiology

## 2016-12-06 DIAGNOSIS — M5412 Radiculopathy, cervical region: Secondary | ICD-10-CM

## 2016-12-06 DIAGNOSIS — N764 Abscess of vulva: Secondary | ICD-10-CM | POA: Insufficient documentation

## 2016-12-15 ENCOUNTER — Other Ambulatory Visit: Payer: Self-pay | Admitting: Anesthesiology

## 2016-12-15 DIAGNOSIS — M5417 Radiculopathy, lumbosacral region: Secondary | ICD-10-CM

## 2016-12-15 DIAGNOSIS — M5412 Radiculopathy, cervical region: Secondary | ICD-10-CM

## 2016-12-16 ENCOUNTER — Ambulatory Visit
Admission: RE | Admit: 2016-12-16 | Discharge: 2016-12-16 | Disposition: A | Discharge status: Home / Self Care | Payer: Medicare Other | Source: Ambulatory Visit | Attending: Anesthesiology | Admitting: Anesthesiology

## 2016-12-16 ENCOUNTER — Other Ambulatory Visit: Payer: Self-pay | Admitting: Anesthesiology

## 2016-12-16 VITALS — BP 131/63 | HR 50

## 2016-12-16 DIAGNOSIS — M5417 Radiculopathy, lumbosacral region: Secondary | ICD-10-CM

## 2016-12-16 DIAGNOSIS — M5412 Radiculopathy, cervical region: Secondary | ICD-10-CM

## 2016-12-16 DIAGNOSIS — M48061 Spinal stenosis, lumbar region without neurogenic claudication: Secondary | ICD-10-CM | POA: Diagnosis not present

## 2016-12-16 DIAGNOSIS — M4316 Spondylolisthesis, lumbar region: Secondary | ICD-10-CM

## 2016-12-16 DIAGNOSIS — M4802 Spinal stenosis, cervical region: Secondary | ICD-10-CM | POA: Diagnosis not present

## 2016-12-16 MED ORDER — MEPERIDINE HCL 100 MG/ML IJ SOLN
75.0000 mg | Freq: Once | INTRAMUSCULAR | Status: AC
  Administered 2016-12-16: 75 mg via INTRAMUSCULAR

## 2016-12-16 MED ORDER — IOPAMIDOL (ISOVUE-M 300) INJECTION 61%
10.0000 mL | Freq: Once | INTRAMUSCULAR | Status: AC | PRN
  Administered 2016-12-16: 10 mL via INTRATHECAL

## 2016-12-16 MED ORDER — DIAZEPAM 5 MG PO TABS
5.0000 mg | ORAL_TABLET | Freq: Once | ORAL | Status: AC
  Administered 2016-12-16: 5 mg via ORAL

## 2016-12-16 MED ORDER — ONDANSETRON HCL 4 MG/2ML IJ SOLN
4.0000 mg | Freq: Once | INTRAMUSCULAR | Status: AC
  Administered 2016-12-16: 4 mg via INTRAMUSCULAR

## 2016-12-16 NOTE — Discharge Instructions (Signed)
Myelogram Discharge Instructions  1. Go home and rest quietly for the next 24 hours.  It is important to lie flat for the next 24 hours.  Get up only to go to the restroom.  You may lie in the bed or on a couch on your back, your stomach, your left side or your right side.  You may have one pillow under your head.  You may have pillows between your knees while you are on your side or under your knees while you are on your back.  2. DO NOT drive today.  Recline the seat as far back as it will go, while still wearing your seat belt, on the way home.  3. You may get up to go to the bathroom as needed.  You may sit up for 10 minutes to eat.  You may resume your normal diet and medications unless otherwise indicated.  Drink lots of extra fluids today and tomorrow.  4. The incidence of headache, nausea, or vomiting is about 5% (one in 20 patients).  If you develop a headache, lie flat and drink plenty of fluids until the headache goes away.  Caffeinated beverages may be helpful.  If you develop severe nausea and vomiting or a headache that does not go away with flat bed rest, call [phone].  5. You may resume normal activities after your 24 hours of bed rest is over; however, do not exert yourself strongly or do any heavy lifting tomorrow. If when you get up you have a headache when standing, go back to bed and force fluids for another 24 hours.  6. Call your physician for a follow-up appointment.  The results of your myelogram will be sent directly to your physician by the following day.  7. If you have any questions or if complications develop after you arrive home, please call [phone].  Discharge instructions have been explained to the patient.  The patient, or the person responsible for the patient, fully understands these instructions.    May resume Zoloft, Elavil, Ultram and Cymbalta on December 17, 2016, after 10:30 am.

## 2016-12-16 NOTE — Progress Notes (Signed)
Pt states she is not taking Cymbalta and has been off ULtram, Elavil and Zoloft for the last 2 days.

## 2016-12-28 DIAGNOSIS — M545 Low back pain: Secondary | ICD-10-CM | POA: Diagnosis not present

## 2016-12-28 DIAGNOSIS — M5412 Radiculopathy, cervical region: Secondary | ICD-10-CM | POA: Diagnosis not present

## 2016-12-29 DIAGNOSIS — M5412 Radiculopathy, cervical region: Secondary | ICD-10-CM | POA: Diagnosis not present

## 2017-01-16 DIAGNOSIS — M180 Bilateral primary osteoarthritis of first carpometacarpal joints: Secondary | ICD-10-CM | POA: Diagnosis not present

## 2017-01-16 DIAGNOSIS — G5603 Carpal tunnel syndrome, bilateral upper limbs: Secondary | ICD-10-CM | POA: Diagnosis not present

## 2017-01-16 DIAGNOSIS — M542 Cervicalgia: Secondary | ICD-10-CM | POA: Diagnosis not present

## 2017-01-17 DIAGNOSIS — E1140 Type 2 diabetes mellitus with diabetic neuropathy, unspecified: Secondary | ICD-10-CM | POA: Diagnosis not present

## 2017-01-17 DIAGNOSIS — G4700 Insomnia, unspecified: Secondary | ICD-10-CM | POA: Diagnosis not present

## 2017-01-17 DIAGNOSIS — Z794 Long term (current) use of insulin: Secondary | ICD-10-CM | POA: Diagnosis not present

## 2017-01-19 DIAGNOSIS — Z6838 Body mass index (BMI) 38.0-38.9, adult: Secondary | ICD-10-CM | POA: Diagnosis not present

## 2017-01-19 DIAGNOSIS — I10 Essential (primary) hypertension: Secondary | ICD-10-CM | POA: Diagnosis not present

## 2017-01-19 DIAGNOSIS — M5412 Radiculopathy, cervical region: Secondary | ICD-10-CM | POA: Diagnosis not present

## 2017-01-19 DIAGNOSIS — M542 Cervicalgia: Secondary | ICD-10-CM | POA: Diagnosis not present

## 2017-01-19 DIAGNOSIS — M545 Low back pain: Secondary | ICD-10-CM | POA: Diagnosis not present

## 2017-01-19 DIAGNOSIS — M4317 Spondylolisthesis, lumbosacral region: Secondary | ICD-10-CM | POA: Diagnosis not present

## 2017-01-19 DIAGNOSIS — M5417 Radiculopathy, lumbosacral region: Secondary | ICD-10-CM | POA: Diagnosis not present

## 2017-02-06 DIAGNOSIS — Z6839 Body mass index (BMI) 39.0-39.9, adult: Secondary | ICD-10-CM | POA: Diagnosis not present

## 2017-02-06 DIAGNOSIS — R030 Elevated blood-pressure reading, without diagnosis of hypertension: Secondary | ICD-10-CM | POA: Diagnosis not present

## 2017-02-06 DIAGNOSIS — M4317 Spondylolisthesis, lumbosacral region: Secondary | ICD-10-CM | POA: Diagnosis not present

## 2017-02-06 DIAGNOSIS — M5417 Radiculopathy, lumbosacral region: Secondary | ICD-10-CM | POA: Diagnosis not present

## 2017-02-06 DIAGNOSIS — M5412 Radiculopathy, cervical region: Secondary | ICD-10-CM | POA: Diagnosis not present

## 2017-02-06 DIAGNOSIS — M542 Cervicalgia: Secondary | ICD-10-CM | POA: Diagnosis not present

## 2017-02-09 DIAGNOSIS — H2513 Age-related nuclear cataract, bilateral: Secondary | ICD-10-CM | POA: Diagnosis not present

## 2017-02-09 DIAGNOSIS — E119 Type 2 diabetes mellitus without complications: Secondary | ICD-10-CM | POA: Diagnosis not present

## 2017-02-09 DIAGNOSIS — D3132 Benign neoplasm of left choroid: Secondary | ICD-10-CM | POA: Diagnosis not present

## 2017-02-09 DIAGNOSIS — H52203 Unspecified astigmatism, bilateral: Secondary | ICD-10-CM | POA: Diagnosis not present

## 2017-02-10 DIAGNOSIS — E039 Hypothyroidism, unspecified: Secondary | ICD-10-CM | POA: Diagnosis not present

## 2017-02-10 DIAGNOSIS — R0781 Pleurodynia: Secondary | ICD-10-CM | POA: Diagnosis not present

## 2017-02-10 DIAGNOSIS — E1140 Type 2 diabetes mellitus with diabetic neuropathy, unspecified: Secondary | ICD-10-CM | POA: Diagnosis not present

## 2017-02-13 DIAGNOSIS — M4317 Spondylolisthesis, lumbosacral region: Secondary | ICD-10-CM | POA: Diagnosis not present

## 2017-02-13 DIAGNOSIS — Z6838 Body mass index (BMI) 38.0-38.9, adult: Secondary | ICD-10-CM | POA: Diagnosis not present

## 2017-02-13 DIAGNOSIS — M546 Pain in thoracic spine: Secondary | ICD-10-CM | POA: Diagnosis not present

## 2017-02-13 DIAGNOSIS — M5417 Radiculopathy, lumbosacral region: Secondary | ICD-10-CM | POA: Diagnosis not present

## 2017-02-13 DIAGNOSIS — I10 Essential (primary) hypertension: Secondary | ICD-10-CM | POA: Diagnosis not present

## 2017-02-13 DIAGNOSIS — M5412 Radiculopathy, cervical region: Secondary | ICD-10-CM | POA: Diagnosis not present

## 2017-02-13 DIAGNOSIS — M542 Cervicalgia: Secondary | ICD-10-CM | POA: Diagnosis not present

## 2017-02-20 DIAGNOSIS — M180 Bilateral primary osteoarthritis of first carpometacarpal joints: Secondary | ICD-10-CM | POA: Diagnosis not present

## 2017-02-20 DIAGNOSIS — M542 Cervicalgia: Secondary | ICD-10-CM | POA: Diagnosis not present

## 2017-02-20 DIAGNOSIS — G5603 Carpal tunnel syndrome, bilateral upper limbs: Secondary | ICD-10-CM | POA: Diagnosis not present

## 2017-02-21 DIAGNOSIS — Z23 Encounter for immunization: Secondary | ICD-10-CM | POA: Diagnosis not present

## 2017-03-01 ENCOUNTER — Ambulatory Visit (INDEPENDENT_AMBULATORY_CARE_PROVIDER_SITE_OTHER): Payer: Medicare Other | Admitting: Physical Therapy

## 2017-03-01 ENCOUNTER — Encounter: Payer: Self-pay | Admitting: Physical Therapy

## 2017-03-01 DIAGNOSIS — M6281 Muscle weakness (generalized): Secondary | ICD-10-CM

## 2017-03-01 DIAGNOSIS — M2560 Stiffness of unspecified joint, not elsewhere classified: Secondary | ICD-10-CM | POA: Diagnosis not present

## 2017-03-01 DIAGNOSIS — R0781 Pleurodynia: Secondary | ICD-10-CM | POA: Diagnosis not present

## 2017-03-01 DIAGNOSIS — M542 Cervicalgia: Secondary | ICD-10-CM | POA: Diagnosis present

## 2017-03-01 NOTE — Patient Instructions (Signed)
Scapula Adduction With Pectorals, Low   Stand in doorframe with palms against frame and arms at 45. Lean forward and squeeze shoulder blades. Hold _45__ seconds. Repeat _2__ times per session. Do _1__ sessions per day.        Marland Kitchen

## 2017-03-01 NOTE — Therapy (Signed)
[place], [place], [address] Phone: [phone]   Fax:  [phone]  Physical Therapy Evaluation  Patient Details  Name: Abigail [name] MRN: [id number] Date of Birth: [birth year] Referring Provider: Dr Broadus John Stern/Dr Melinda Crutch  Encounter Date: 03/01/2017      PT End of Session - 03/01/17 1421    Visit Number 1   Number of Visits 12   Date for PT Re-Evaluation 04/12/17   Authorization Type Gcode at 10th visit   PT Start Time 1421   PT Stop Time 1517   PT Time Calculation (min) 56 min   Activity Tolerance Patient tolerated treatment well      Past Medical History:  Diagnosis Date  . Boils    near vaginal area  . CAD (coronary artery disease)   . Chronic kidney disease    stage 3; low kidney function; BUN was high acc to pt  . Constipation due to pain medication   . Depression   . Diabetes mellitus    Type 2  . Family hx of colon cancer   . Fibromyalgia   . Frequency of urination   . Frequent urination at night   . GERD (gastroesophageal reflux disease)    pt denies  . Heart murmur   . History of bladder infections   . Hyperlipidemia   . Hypertension   . Hypothyroidism   . Itchy eyes   . Obesity   . Obesity   . PONV (postoperative nausea and vomiting)    with stapedectomy  . Torn rotator cuff left   current    Past Surgical History:  Procedure Laterality Date  . BACK SURGERY     lumbar fusion  . CHOLECYSTECTOMY  1971  . COLONOSCOPY    . DILATION AND CURETTAGE OF UTERUS    . MAXIMUM ACCESS (MAS)POSTERIOR LUMBAR INTERBODY FUSION (PLIF) 1 LEVEL N/A 11/07/2014   Procedure: Lumbar Four-Five Maximum access posterior lumbar interbody fusion;  Surgeon: Erline Levine, MD;  Location: [place] [facility];  Service: Neurosurgery;  Laterality: N/A;  L4-5 Maximum access posterior lumbar interbody fusion  . SHOULDER SURGERY     bilateral rotator cuff repairs  . STAPEDECTOMY Left    ear  .  TUBAL LIGATION  1971    There were no vitals filed for this visit.       Subjective Assessment - 03/01/17 1421    Subjective Pt reports she has pain from her neck down to her hands due to a cyst in her neck. This all began about a year or so ago.   The MD wants her to try cervical traction to see if this will relieve her symptoms, she is not really a surgical candidate.  She noticed rt rib pain in July of this year, saw her primary MD and they aren't sure what it is, he wants her to come to PT and see if it can help. She is wearing bilat thumb braces due to hand arthrtitis and uses a cockup splints to sleep.    Pertinent History C5-7 cyst being monitored and using meds ( cymbalta) , failed lumbar fusion 2016, DM, HTN, fibromyalgia, bilat RTC, seeing pain management MD to help manage the overall pain.    How long can you sit comfortably? no limitations   How long can you walk comfortably? 5' limited by all over body pain.    Diagnostic tests x-ray of ribs (-)    Patient Stated Goals get rid  of Rt rib pain   Currently in Pain? Yes   Pain Score 3    Pain Location Rib cage   Pain Orientation Right   Pain Descriptors / Indicators Sharp;Squeezing   Pain Type Acute pain   Pain Onset 1 to 4 weeks ago   Pain Frequency Intermittent   Aggravating Factors  with certain movements, slouching, reaching and stretching out   Pain Relieving Factors sitting up very straight, heat   Effect of Pain on Daily Activities no neck or arm pain at this moment. When she has it the pain radiates down both arms, the pain has no pattern it will just come on. Duration fluctuates.    Multiple Pain Sites Yes            [facility] PT Assessment - 03/01/17 0001      Assessment   Medical Diagnosis cervicalgia, Rt rib pain   Referring Provider Dr Broadus John Stern/Dr Melinda Crutch   Onset Date/Surgical Date 03/01/16   Hand Dominance Right   Next MD Visit --  Dr Vertell Limber mid Nov, Dr Harrington Challenger December   Prior Therapy for low back      Precautions   Precautions None     Restrictions   Weight Bearing Restrictions No     Balance Screen   Has the patient fallen in the past 6 months No     [place] residence   Living Arrangements Spouse/significant other     Prior Function   Level of [place] Retired   Leisure volunteering at [employer] , try to go to [employer] for [employer]     Observation/Other Assessments   Focus on Therapeutic Outcomes (FOTO)  60% limited     Functional Tests   Functional tests --     Posture/Postural Control   Posture/Postural Control Postural limitations   Postural Limitations Rounded Shoulders;Forward head;Increased thoracic kyphosis  obesity     ROM / Strength   AROM / PROM / Strength AROM;Strength     AROM   AROM Assessment Site Shoulder;Elbow;Cervical;Lumbar;Hip;Thoracic   Cervical Flexion 46   Cervical Extension 27 with pain   Cervical - Right Rotation 51   Cervical - Left Rotation 45with pain   Lumbar Flexion to floor   Lumbar Extension 25% present with low back and Rt rib pain   Lumbar - Right Rotation 50%, pain in Rt ribs   Lumbar - Left Rotation 50% pain in low back   Thoracic Flexion limited d/t abdominal tissue   Thoracic Extension 75% present   Thoracic - Right Rotation 25% Rt rib pain   Thoracic - Left Rotation 90% slight Rt rib pain     Strength   Strength Assessment Site Shoulder;Elbow   Right/Left Shoulder --  WNL   Right/Left Elbow --  WNL     Flexibility   Soft Tissue Assessment /Muscle Length --     Palpation   Palpation comment Rt rib springing WNL and no pain, pain with palpation of Rt obliques, Pain in bilat UE's.             Objective measurements completed on examination: See above findings.          [facility] Adult PT Treatment/Exercise - 03/01/17 0001      Exercises   Exercises Other Exercises   Other Exercises  low doorway stretch, seated side bend stretch      Modalities   Modalities Moist Heat;Traction     Moist  Heat Therapy   Number Minutes Moist Heat 16 Minutes   Moist Heat Location Lumbar Spine;Cervical     Traction   Type of Traction Cervical   Max (lbs) 14   Hold Time static hold   Time 14                PT Education - 03/01/17 1506    Education provided Yes   Education Details HEP   Person(s) Educated Patient   Methods Explanation;Demonstration;Handout   Comprehension Returned demonstration;Verbalized understanding             PT Long Term Goals - 03/01/17 1412      PT LONG TERM GOAL #1   Title demo lumbar/thoracic/cervical motion with no more than 25% limitations and no more than 2/10 pain ( 04/12/17)    Time 6   Period Weeks   Status New     PT LONG TERM GOAL #2   Title Be independent with advanced HEP (04/12/17)    Time 6   Period Weeks   Status New     PT LONG TERM GOAL #3   Title Report pain decrease =/>50% in her neck and hip with daily activity ( 04/12/17)    Time 6   Period Weeks   Status New     PT LONG TERM GOAL #4   Title demo strong contraction of TA and multifidi with core ther ex ( 04/12/17)    Time 6   Period Weeks   Status New     PT LONG TERM GOAL #5   Title Improve FOTO =/< 41% limited (CL level) (04/12/17)    Time 6   Period Weeks   Status New                Plan - 03/01/17 1416    Clinical Impression Statement 76 yo female with multiple medical issues.  She presents with generalized pain, two referrals from different MDs.  First on is for her neck due to a cyst, she has pain in bilat UE's and MD wishes to trial traction to see if this will give her UE relief as she is not a surgical candidate at this time.  Second is for hip pain however patient reports her hip is fine and she has Rt rib pain.  This was assessed and is included in this plan of care for MD approval.  She has painful and limited spinal motion from her cervical to lumbar.  Main limitation are with  extension and rotation. She has postural changes, overall weakness and pain that limit all aspects of her daily living. She did not have pain with rib springing however did have pain with palpation in the Rt obliques and with motions where these are activiated. Cervical traction was stopped about 2' early due to headache   History and Personal Factors relevant to plan of care: DM, fibromyalgia, HTN, bilat RTC repair, lumbar fusion 2016   Clinical Presentation Evolving   Clinical Decision Making Moderate   Rehab Potential Good   PT Frequency 2x / week   PT Duration 6 weeks   PT Treatment/Interventions Moist Heat;Traction;Ultrasound;Therapeutic exercise;Dry needling;Manual techniques;Neuromuscular re-education;Cryotherapy;Electrical Stimulation;Patient/family education;Passive range of motion   PT Next Visit Plan manual work to Rt obliques, gentle core work, cervical traction trial - decrease pull to 12# d/t headache at first session and other modalities PRN   Consulted and Agree with Plan of Care Patient      Patient will benefit from skilled therapeutic  intervention in order to improve the following deficits and impairments:  Pain, Postural dysfunction, Decreased strength, Obesity, Decreased range of motion, Increased muscle spasms  Visit Diagnosis: Cervicalgia - Plan: PT plan of care cert/re-cert  Muscle weakness (generalized) - Plan: PT plan of care cert/re-cert  Painful rib - Plan: PT plan of care cert/re-cert  Joint stiffness of spine - Plan: PT plan of care cert/re-cert      [facility] PT PB G-CODES - 03-14-17 1420    Functional Assessment Tool Used  FOTO and professional judgement   Functional Limitations Other PT primary   Other PT Primary Current Status (R7116) At least 60 percent but less than 80 percent impaired, limited or restricted   Other PT Primary Goal Status (F7903) At least 40 percent but less than 60 percent impaired, limited or restricted       Problem List Patient  Active Problem List   Diagnosis Date Noted  . Furuncle of vulva 12/06/2016  . Bilateral carpal tunnel syndrome 11/23/2016  . Neck pain 11/23/2016  . Primary osteoarthritis of both first carpometacarpal joints 11/23/2016  . Diabetic nephropathy ([place]) 08/18/2016  . Right knee pain 01/03/2016  . Asymmetrical hearing loss of both ears 02/27/2015  . Pressure ulcer 11/10/2014  . Spondylolisthesis of lumbar region 11/07/2014  . CKD (chronic kidney disease), stage III ([place]) 07/24/2013  . Chest pain 07/23/2013  . Chronic pain 07/23/2013  . Fibromyalgia 07/23/2013  . Leukocytosis 02/07/2013  . Weakness generalized 02/07/2013  . Acute encephalopathy 02/05/2013  . Anemia 02/05/2013  . Acute renal failure ([place]) 02/05/2013  . Dehydration 09/12/2012  . Diabetes mellitus ([place]) 09/12/2012  . Hypertension 09/12/2012  . Hypothyroidism 09/12/2012  . Hyperlipidemia 09/12/2012    Jeral Pinch PT  2017-03-14, 3:23 PM  [facility] [place], [place], [address] Phone: [phone]   Fax:  [phone]  Name: Abigail [name] MRN: [id number] Date of Birth: June 12, 1940

## 2017-03-02 DIAGNOSIS — B351 Tinea unguium: Secondary | ICD-10-CM | POA: Diagnosis not present

## 2017-03-02 DIAGNOSIS — R200 Anesthesia of skin: Secondary | ICD-10-CM | POA: Diagnosis not present

## 2017-03-07 ENCOUNTER — Encounter: Payer: Self-pay | Admitting: Physical Therapy

## 2017-03-07 ENCOUNTER — Ambulatory Visit (INDEPENDENT_AMBULATORY_CARE_PROVIDER_SITE_OTHER): Payer: Medicare Other | Admitting: Physical Therapy

## 2017-03-07 DIAGNOSIS — M6281 Muscle weakness (generalized): Secondary | ICD-10-CM | POA: Diagnosis present

## 2017-03-07 DIAGNOSIS — M542 Cervicalgia: Secondary | ICD-10-CM

## 2017-03-07 DIAGNOSIS — M2560 Stiffness of unspecified joint, not elsewhere classified: Secondary | ICD-10-CM

## 2017-03-07 DIAGNOSIS — R0781 Pleurodynia: Secondary | ICD-10-CM | POA: Diagnosis not present

## 2017-03-07 NOTE — Therapy (Signed)
[place], [place], [address] Phone: [phone]   Fax:  [phone]  Physical Therapy Treatment  Patient Details  Name: Abigail [name] MRN: [id number] Date of Birth: [birth year] Referring Provider: Dr Broadus John Stern/Dr Melinda Crutch  Encounter Date: 03/07/2017      PT End of Session - 03/07/17 1016    Visit Number 2   Number of Visits 12   Date for PT Re-Evaluation 04/12/17   Authorization Type Gcode at 10th visit   PT Start Time 1016   PT Stop Time 1101   PT Time Calculation (min) 45 min   Activity Tolerance Patient limited by pain      Past Medical History:  Diagnosis Date  . Boils    near vaginal area  . CAD (coronary artery disease)   . Chronic kidney disease    stage 3; low kidney function; BUN was high acc to pt  . Constipation due to pain medication   . Depression   . Diabetes mellitus    Type 2  . Family hx of colon cancer   . Fibromyalgia   . Frequency of urination   . Frequent urination at night   . GERD (gastroesophageal reflux disease)    pt denies  . Heart murmur   . History of bladder infections   . Hyperlipidemia   . Hypertension   . Hypothyroidism   . Itchy eyes   . Obesity   . Obesity   . PONV (postoperative nausea and vomiting)    with stapedectomy  . Torn rotator cuff left   current    Past Surgical History:  Procedure Laterality Date  . BACK SURGERY     lumbar fusion  . CHOLECYSTECTOMY  1971  . COLONOSCOPY    . DILATION AND CURETTAGE OF UTERUS    . MAXIMUM ACCESS (MAS)POSTERIOR LUMBAR INTERBODY FUSION (PLIF) 1 LEVEL N/A 11/07/2014   Procedure: Lumbar Four-Five Maximum access posterior lumbar interbody fusion;  Surgeon: Erline Levine, MD;  Location: [place] [facility];  Service: Neurosurgery;  Laterality: N/A;  L4-5 Maximum access posterior lumbar interbody fusion  . SHOULDER SURGERY     bilateral rotator cuff repairs  . STAPEDECTOMY Left    ear  . TUBAL  LIGATION  1971    There were no vitals filed for this visit.      Subjective Assessment - 03/07/17 1020    Subjective Abigail [name] reports her pain has been really bad all weekend.  Today it is down a little.  She was only able to perform her stretch once due toher pain.    Currently in Pain? Yes   Pain Score 3    Pain Location Rib cage   Pain Orientation Right   Pain Descriptors / Indicators Squeezing;Sharp   Pain Type Acute pain   Pain Onset 1 to 4 weeks ago   Pain Frequency Constant   Aggravating Factors  certain movements   Pain Relieving Factors heat                         [facility] Adult PT Treatment/Exercise - 03/07/17 0001      Exercises   Exercises Neck     Neck Exercises: Standing   Other Standing Exercises scap retraction around noodle      Neck Exercises: Seated   Other Seated Exercise pulleys for flex  some pain into the Lt upper arm     Neck Exercises: Supine  Other Supine Exercise 10 reps LTR, 10 reps leg lengtheners, shoulder presses, head presses     Modalities   Modalities Moist Heat;Traction     Moist Heat Therapy   Number Minutes Moist Heat 15 Minutes   Moist Heat Location Cervical;Lumbar Spine  abdomen     Traction   Type of Traction Cervical   Max (lbs) 12   Hold Time static hold   Time 15                     PT Long Term Goals - 03/07/17 1017      PT LONG TERM GOAL #1   Title demo lumbar/thoracic/cervical motion with no more than 25% limitations and no more than 2/10 pain ( 04/12/17)    Status On-going     PT LONG TERM GOAL #2   Title Be independent with advanced HEP (04/12/17)    Status On-going     PT LONG TERM GOAL #3   Title Report pain decrease =/>50% in her neck and hip with daily activity ( 04/12/17)    Status On-going     PT LONG TERM GOAL #4   Title demo strong contraction of TA and multifidi with core ther ex ( 04/12/17)    Status On-going     PT LONG TERM GOAL #5   Title Improve FOTO =/< 41%  limited (CL level) (04/12/17)    Status On-going               Plan - 03/07/17 1036    Clinical Impression Statement Abigail [name] has pain in her whole body.  She has multiple areas of fascial tightness and feels "pulling/stretching" in her back with all exercise. This is only her second visit, no goals met.  Treatment is limited due to pain.  She did tolerate cervical traction better.    Rehab Potential Good   PT Frequency 2x / week   PT Duration 6 weeks   PT Treatment/Interventions Moist Heat;Traction;Ultrasound;Therapeutic exercise;Dry needling;Manual techniques;Neuromuscular re-education;Cryotherapy;Electrical Stimulation;Patient/family education;Passive range of motion   PT Next Visit Plan increase cervical traction pull if no headache. She believes she has a home traction unit, if she can find it she will bring it in   Consulted and Agree with Plan of Care Patient      Patient will benefit from skilled therapeutic intervention in order to improve the following deficits and impairments:  Pain, Postural dysfunction, Decreased strength, Obesity, Decreased range of motion, Increased muscle spasms  Visit Diagnosis: Muscle weakness (generalized)  Cervicalgia  Painful rib  Joint stiffness of spine     Problem List Patient Active Problem List   Diagnosis Date Noted  . Furuncle of vulva 12/06/2016  . Bilateral carpal tunnel syndrome 11/23/2016  . Neck pain 11/23/2016  . Primary osteoarthritis of both first carpometacarpal joints 11/23/2016  . Diabetic nephropathy ([place]) 08/18/2016  . Right knee pain 01/03/2016  . Asymmetrical hearing loss of both ears 02/27/2015  . Pressure ulcer 11/10/2014  . Spondylolisthesis of lumbar region 11/07/2014  . CKD (chronic kidney disease), stage III ([place]) 07/24/2013  . Chest pain 07/23/2013  . Chronic pain 07/23/2013  . Fibromyalgia 07/23/2013  . Leukocytosis 02/07/2013  . Weakness generalized 02/07/2013  . Acute encephalopathy 02/05/2013   . Anemia 02/05/2013  . Acute renal failure ([place]) 02/05/2013  . Dehydration 09/12/2012  . Diabetes mellitus ([place]) 09/12/2012  . Hypertension 09/12/2012  . Hypothyroidism 09/12/2012  . Hyperlipidemia 09/12/2012    Jeral Pinch PT  03/07/2017, 10:51  [place], [place], [address] Phone: [phone]   Fax:  [phone]  Name: Abigail [name] MRN: [id number] Date of Birth: 1940-06-29

## 2017-03-09 ENCOUNTER — Ambulatory Visit (INDEPENDENT_AMBULATORY_CARE_PROVIDER_SITE_OTHER): Payer: Medicare Other | Admitting: Physical Therapy

## 2017-03-09 DIAGNOSIS — M2560 Stiffness of unspecified joint, not elsewhere classified: Secondary | ICD-10-CM

## 2017-03-09 DIAGNOSIS — M6281 Muscle weakness (generalized): Secondary | ICD-10-CM | POA: Diagnosis not present

## 2017-03-09 DIAGNOSIS — M542 Cervicalgia: Secondary | ICD-10-CM

## 2017-03-09 DIAGNOSIS — R0781 Pleurodynia: Secondary | ICD-10-CM

## 2017-03-09 NOTE — Therapy (Signed)
[place], [place], [address] Phone: [phone]   Fax:  [phone]  Physical Therapy Treatment  Patient Details  Name: Abigail [name] MRN: [id number] Date of Birth: 05-19-1941 Referring Provider: Dr Broadus John Stern/Dr Melinda Crutch  Encounter Date: 03/09/2017      PT End of Session - 03/09/17 1144    Visit Number 3   Number of Visits 12   Date for PT Re-Evaluation 04/12/17   Authorization Type Gcode at 10th visit   PT Start Time 1103   PT Stop Time 1158   PT Time Calculation (min) 55 min   Activity Tolerance Patient limited by pain   Behavior During Therapy [facility] for tasks assessed/performed      Past Medical History:  Diagnosis Date  . Boils    near vaginal area  . CAD (coronary artery disease)   . Chronic kidney disease    stage 3; low kidney function; BUN was high acc to pt  . Constipation due to pain medication   . Depression   . Diabetes mellitus    Type 2  . Family hx of colon cancer   . Fibromyalgia   . Frequency of urination   . Frequent urination at night   . GERD (gastroesophageal reflux disease)    pt denies  . Heart murmur   . History of bladder infections   . Hyperlipidemia   . Hypertension   . Hypothyroidism   . Itchy eyes   . Obesity   . Obesity   . PONV (postoperative nausea and vomiting)    with stapedectomy  . Torn rotator cuff left   current    Past Surgical History:  Procedure Laterality Date  . BACK SURGERY     lumbar fusion  . CHOLECYSTECTOMY  1971  . COLONOSCOPY    . DILATION AND CURETTAGE OF UTERUS    . MAXIMUM ACCESS (MAS)POSTERIOR LUMBAR INTERBODY FUSION (PLIF) 1 LEVEL N/A 11/07/2014   Procedure: Lumbar Four-Five Maximum access posterior lumbar interbody fusion;  Surgeon: Erline Levine, MD;  Location: [place] [facility];  Service: Neurosurgery;  Laterality: N/A;  L4-5 Maximum access posterior lumbar interbody fusion  . SHOULDER SURGERY     bilateral  rotator cuff repairs  . STAPEDECTOMY Left    ear  . TUBAL LIGATION  1971    There were no vitals filed for this visit.      Subjective Assessment - 03/09/17 1118    Subjective Pt reports she feels a little better than last visit.  She has been doing the doorway stretch and neck exercise. Pt brought her home traction unit; is unsure how to use it. She would like to know how to use it again and if that would be helpful.    Currently in Pain? Yes   Pain Score 2    Pain Location Neck   Pain Orientation Right   Aggravating Factors  certain movements    Pain Relieving Factors heat             [facility] PT Assessment - 03/09/17 0001      Assessment   Medical Diagnosis cervicalgia, Rt rib pain     AROM   Cervical - Right Rotation 66  supine   Cervical - Left Rotation 52  supine         [facility] Adult PT Treatment/Exercise - 03/09/17 0001      Self-Care   Self-Care Other Self-Care Comments   Other Self-Care Comments  Pt instructed in safety and parameters of home traction unit.  Pt verbalized understanding. Pt had difficulty operating unit despite multiple cues     Neck Exercises: Standing   Other Standing Exercises low doorway stretch x 30 sec x 2 reps     Neck Exercises: Supine   Other Supine Exercise 10 reps LTR to tolerance. 10 reps of each : leg lengtheners, thoracic lifts, head presses.   Other Supine Exercise prolonged snow angel stretch.      Moist Heat Therapy   Number Minutes Moist Heat 19 Minutes   Moist Heat Location Lumbar Spine;Cervical  and one on Rt rib     Traction   Type of Traction Cervical   Min (lbs) -   Max (lbs) 12   Hold Time static hold   Rest Time -   Time 19    Reviewed TA contraction and educated pt on use with transitional movements. Pt verbalized understanding.         PT Long Term Goals - 03/07/17 1017      PT LONG TERM GOAL #1   Title demo lumbar/thoracic/cervical motion with no more than 25% limitations and no more than 2/10  pain ( 04/12/17)    Status On-going     PT LONG TERM GOAL #2   Title Be independent with advanced HEP (04/12/17)    Status On-going     PT LONG TERM GOAL #3   Title Report pain decrease =/>50% in her neck and hip with daily activity ( 04/12/17)    Status On-going     PT LONG TERM GOAL #4   Title demo strong contraction of TA and multifidi with core ther ex ( 04/12/17)    Status On-going     PT LONG TERM GOAL #5   Title Improve FOTO =/< 41% limited (CL level) (04/12/17)    Status On-going            Plan - 03/09/17 1128    Clinical Impression Statement Pt demonstrated improved cervical rotation ROM today.   Multiple areas of fascial tightness continues.  Limited tolerance for exercise due to increased pain and difficulty with transitional motions.  Pt progressing gradually towards goals.    Rehab Potential Good   PT Frequency 2x / week   PT Duration 6 weeks   PT Treatment/Interventions Moist Heat;Traction;Ultrasound;Therapeutic exercise;Dry needling;Manual techniques;Neuromuscular re-education;Cryotherapy;Electrical Stimulation;Patient/family education;Passive range of motion   PT Next Visit Plan continue spinal stabilization exercises and traction.  modalities as indicated.    Consulted and Agree with Plan of Care Patient      Patient will benefit from skilled therapeutic intervention in order to improve the following deficits and impairments:  Pain, Postural dysfunction, Decreased strength, Obesity, Decreased range of motion, Increased muscle spasms  Visit Diagnosis: Cervicalgia  Painful rib  Muscle weakness (generalized)  Joint stiffness of spine     Problem List Patient Active Problem List   Diagnosis Date Noted  . Furuncle of vulva 12/06/2016  . Bilateral carpal tunnel syndrome 11/23/2016  . Neck pain 11/23/2016  . Primary osteoarthritis of both first carpometacarpal joints 11/23/2016  . Diabetic nephropathy ([place]) 08/18/2016  . Right knee pain 01/03/2016   . Asymmetrical hearing loss of both ears 02/27/2015  . Pressure ulcer 11/10/2014  . Spondylolisthesis of lumbar region 11/07/2014  . CKD (chronic kidney disease), stage III ([place]) 07/24/2013  . Chest pain 07/23/2013  . Chronic pain 07/23/2013  . Fibromyalgia 07/23/2013  . Leukocytosis 02/07/2013  . Weakness generalized 02/07/2013  .  Acute encephalopathy 02/05/2013  . Anemia 02/05/2013  . Acute renal failure ([place]) 02/05/2013  . Dehydration 09/12/2012  . Diabetes mellitus ([place]) 09/12/2012  . Hypertension 09/12/2012  . Hypothyroidism 09/12/2012  . Hyperlipidemia 09/12/2012    Kerin Perna, PTA 03/09/17 12:59 PM  [place], [place], [address] Phone: [phone]   Fax:  [phone]  Name: MAYLING ABER MRN: [id number] Date of Birth: 08-25-1940

## 2017-03-13 ENCOUNTER — Encounter: Payer: Medicare Other | Admitting: Physical Therapy

## 2017-03-15 ENCOUNTER — Ambulatory Visit (INDEPENDENT_AMBULATORY_CARE_PROVIDER_SITE_OTHER): Payer: Medicare Other | Admitting: Physical Therapy

## 2017-03-15 ENCOUNTER — Encounter: Payer: Self-pay | Admitting: Physical Therapy

## 2017-03-15 DIAGNOSIS — M2560 Stiffness of unspecified joint, not elsewhere classified: Secondary | ICD-10-CM

## 2017-03-15 DIAGNOSIS — M542 Cervicalgia: Secondary | ICD-10-CM | POA: Diagnosis present

## 2017-03-15 DIAGNOSIS — R0781 Pleurodynia: Secondary | ICD-10-CM

## 2017-03-15 DIAGNOSIS — M6281 Muscle weakness (generalized): Secondary | ICD-10-CM | POA: Diagnosis not present

## 2017-03-15 NOTE — Therapy (Signed)
[place], [place], [address] Phone: [phone]   Fax:  [phone]  Physical Therapy Treatment  Patient Details  Name: Abigail [name] MRN: [id number] Date of Birth: Nov 26, 1940 Referring Provider: Dr Broadus John Stern/Dr Melinda Crutch  Encounter Date: 03/09/2017      PT End of Session - 03/15/17 1547    Visit Number 4   Number of Visits 12   Date for PT Re-Evaluation 04/12/17   Authorization Type Gcode at 10th visit   PT Start Time 1510   PT Stop Time 1606   PT Time Calculation (min) 56 min   Activity Tolerance Patient tolerated treatment well      Past Medical History:  Diagnosis Date  . Boils    near vaginal area  . CAD (coronary artery disease)   . Chronic kidney disease    stage 3; low kidney function; BUN was high acc to pt  . Constipation due to pain medication   . Depression   . Diabetes mellitus    Type 2  . Family hx of colon cancer   . Fibromyalgia   . Frequency of urination   . Frequent urination at night   . GERD (gastroesophageal reflux disease)    pt denies  . Heart murmur   . History of bladder infections   . Hyperlipidemia   . Hypertension   . Hypothyroidism   . Itchy eyes   . Obesity   . Obesity   . PONV (postoperative nausea and vomiting)    with stapedectomy  . Torn rotator cuff left   current    Past Surgical History:  Procedure Laterality Date  . BACK SURGERY     lumbar fusion  . CHOLECYSTECTOMY  1971  . COLONOSCOPY    . DILATION AND CURETTAGE OF UTERUS    . MAXIMUM ACCESS (MAS)POSTERIOR LUMBAR INTERBODY FUSION (PLIF) 1 LEVEL N/A 11/07/2014   Procedure: Lumbar Four-Five Maximum access posterior lumbar interbody fusion;  Surgeon: Erline Levine, MD;  Location: [facility];  Service: Neurosurgery;  Laterality: N/A;  L4-5 Maximum access posterior lumbar interbody fusion  . SHOULDER SURGERY     bilateral rotator cuff repairs  . STAPEDECTOMY Left    ear  .  TUBAL LIGATION  1971    There were no vitals filed for this visit.      Subjective Assessment - 03/15/17 1511    Subjective She had a temp on Monday and feels a little worn out.    Patient Stated Goals get rid of Rt rib pain   Currently in Pain? Yes  no rib pain today,some arm pain Lt > Rt 1.5/10    Pain Score 3    Pain Location Back   Pain Orientation Lower   Pain Descriptors / Indicators Dull   Pain Type Chronic pain   Pain Frequency Intermittent   Aggravating Factors  certain motions, being a caregiver/stress.    Pain Relieving Factors heat.             [facility] PT Assessment - 03/15/17 0001      Assessment   Medical Diagnosis cervicalgia, Rt rib pain     AROM   Cervical Flexion 35   Cervical Extension 47   Cervical - Right Rotation 72   Cervical - Left Rotation 44 with pain   Thoracic Flexion limited d/t abdominal tissue   Thoracic Extension WNL slight low back pain   Thoracic - Right Rotation 75% no pain  Thoracic - Left Rotation 90% no pain                     [facility] Adult PT Treatment/Exercise - 03/15/17 0001      Neck Exercises: Machines for Strengthening   UBE (Upper Arm Bike) L1x4' alt FWD/BWD     Neck Exercises: Seated   Shoulder Rolls 10 reps   Other Seated Exercise chest stretch and upper back stetch   Other Seated Exercise Lt/Rt UE 10x5sec ER isometrics  seated W's with VC for form.      Neck Exercises: Supine   Other Supine Exercise 10x 5 sec holds isometric squeezes with single leg and bilat UE's , then bridges with UE horizontal abduction      Modalities   Modalities Moist Heat;Traction     Moist Heat Therapy   Number Minutes Moist Heat 19 Minutes   Moist Heat Location Cervical;Lumbar Spine  and Rt ribs     Traction   Type of Traction Cervical   Min (lbs) -   Max (lbs) 14   Hold Time static hold   Rest Time -   Time 19     Neck Exercises: Stretches   Levator Stretch 30 seconds  each side                      PT Long Term Goals - 03/15/17 1529      PT LONG TERM GOAL #1   Title demo lumbar/thoracic/cervical motion with no more than 25% limitations and no more than 2/10 pain ( 04/12/17)      PT LONG TERM GOAL #2   Title Be independent with advanced HEP (04/12/17)    Status On-going     PT LONG TERM GOAL #3   Title Report pain decrease =/>50% in her neck and rib with daily activity ( 04/12/17)    Status Achieved  50% improvement neck and arms, more than that for ribs     PT LONG TERM GOAL #4   Title demo strong contraction of TA and multifidi with core ther ex ( 04/12/17)    Status On-going     PT LONG TERM GOAL #5   Title Improve FOTO =/< 41% limited (CL level) (04/12/17)    Status On-going               Plan - 03/15/17 1549    Clinical Impression Statement Abigail [name] was sick earllier in the week, still getting better, Neck and rib pain have improved. She has met this goal and making slow progress to the others.  Her low back is what is bothering her the most.    Rehab Potential Good   PT Frequency 2x / week   PT Duration 6 weeks   PT Treatment/Interventions Moist Heat;Traction;Ultrasound;Therapeutic exercise;Dry needling;Manual techniques;Neuromuscular re-education;Cryotherapy;Electrical Stimulation;Patient/family education;Passive range of motion   PT Next Visit Plan continue spinal stabilization exercises and traction.  modalities as indicated.    Consulted and Agree with Plan of Care Patient      Patient will benefit from skilled therapeutic intervention in order to improve the following deficits and impairments:  Pain, Postural dysfunction, Decreased strength, Obesity, Decreased range of motion, Increased muscle spasms  Visit Diagnosis: Cervicalgia  Painful rib  Muscle weakness (generalized)  Joint stiffness of spine     Problem List Patient Active Problem List   Diagnosis Date Noted  . Furuncle of vulva 12/06/2016  . Bilateral  carpal tunnel syndrome 11/23/2016  .  Neck pain 11/23/2016  . Primary osteoarthritis of both first carpometacarpal joints 11/23/2016  . Diabetic nephropathy ([place]) 08/18/2016  . Right knee pain 01/03/2016  . Asymmetrical hearing loss of both ears 02/27/2015  . Pressure ulcer 11/10/2014  . Spondylolisthesis of lumbar region 11/07/2014  . CKD (chronic kidney disease), stage III ([place]) 07/24/2013  . Chest pain 07/23/2013  . Chronic pain 07/23/2013  . Fibromyalgia 07/23/2013  . Leukocytosis 02/07/2013  . Weakness generalized 02/07/2013  . Acute encephalopathy 02/05/2013  . Anemia 02/05/2013  . Acute renal failure ([place]) 02/05/2013  . Dehydration 09/12/2012  . Diabetes mellitus ([place]) 09/12/2012  . Hypertension 09/12/2012  . Hypothyroidism 09/12/2012  . Hyperlipidemia 09/12/2012    Jeral Pinch PT  03/15/2017, 3:52 PM  [facility] [place], [place], [address] Phone: [phone]   Fax:  [phone]  Name: Abigail [name] MRN: [id number] Date of Birth: 08/19/1940

## 2017-03-15 NOTE — Therapy (Signed)
[place], [place], [address] Phone: [phone]   Fax:  [phone]  Physical Therapy Treatment  Patient Details  Name: Abigail [name] MRN: [id number] Date of Birth: [birth year] Referring Provider: Dr Broadus John Stern/Dr Melinda Crutch  Encounter Date: 03/15/2017      PT End of Session - 03/15/17 1547    Visit Number 4   Number of Visits 12   Date for PT Re-Evaluation 04/12/17   Authorization Type Gcode at 10th visit   PT Start Time 1510   PT Stop Time 1606   PT Time Calculation (min) 56 min   Activity Tolerance Patient tolerated treatment well      Past Medical History:  Diagnosis Date  . Boils    near vaginal area  . CAD (coronary artery disease)   . Chronic kidney disease    stage 3; low kidney function; BUN was high acc to pt  . Constipation due to pain medication   . Depression   . Diabetes mellitus    Type 2  . Family hx of colon cancer   . Fibromyalgia   . Frequency of urination   . Frequent urination at night   . GERD (gastroesophageal reflux disease)    pt denies  . Heart murmur   . History of bladder infections   . Hyperlipidemia   . Hypertension   . Hypothyroidism   . Itchy eyes   . Obesity   . Obesity   . PONV (postoperative nausea and vomiting)    with stapedectomy  . Torn rotator cuff left   current    Past Surgical History:  Procedure Laterality Date  . BACK SURGERY     lumbar fusion  . CHOLECYSTECTOMY  1971  . COLONOSCOPY    . DILATION AND CURETTAGE OF UTERUS    . MAXIMUM ACCESS (MAS)POSTERIOR LUMBAR INTERBODY FUSION (PLIF) 1 LEVEL N/A 11/07/2014   Procedure: Lumbar Four-Five Maximum access posterior lumbar interbody fusion;  Surgeon: Erline Levine, MD;  Location: [facility];  Service: Neurosurgery;  Laterality: N/A;  L4-5 Maximum access posterior lumbar interbody fusion  . SHOULDER SURGERY     bilateral rotator cuff repairs  . STAPEDECTOMY Left    ear  .  TUBAL LIGATION  1971    There were no vitals filed for this visit.      Subjective Assessment - 03/15/17 1511    Subjective She had a temp on Monday and feels a little worn out.    Patient Stated Goals get rid of Rt rib pain   Currently in Pain? Yes  no rib pain today,some arm pain Lt > Rt 1.5/10    Pain Score 3    Pain Location Back   Pain Orientation Lower   Pain Descriptors / Indicators Dull   Pain Type Chronic pain   Pain Frequency Intermittent   Aggravating Factors  certain motions, being a caregiver/stress.    Pain Relieving Factors heat.             [facility] PT Assessment - 03/15/17 0001      Assessment   Medical Diagnosis cervicalgia, Rt rib pain     AROM   Cervical Flexion 35   Cervical Extension 47   Cervical - Right Rotation 72   Cervical - Left Rotation 44 with pain   Thoracic Flexion limited d/t abdominal tissue   Thoracic Extension WNL slight low back pain   Thoracic - Right Rotation 75% no pain  Thoracic - Left Rotation 90% no pain                     [facility] Adult PT Treatment/Exercise - 03/15/17 0001      Neck Exercises: Machines for Strengthening   UBE (Upper Arm Bike) L1x4' alt FWD/BWD     Neck Exercises: Seated   Shoulder Rolls 10 reps   Other Seated Exercise chest stretch and upper back stetch   Other Seated Exercise Lt/Rt UE 10x5sec ER isometrics  seated W's with VC for form.      Neck Exercises: Supine   Other Supine Exercise 10x 5 sec holds isometric squeezes with single leg and bilat UE's , then bridges with UE horizontal abduction      Modalities   Modalities Moist Heat;Traction     Moist Heat Therapy   Number Minutes Moist Heat 19 Minutes   Moist Heat Location Cervical;Lumbar Spine  and Rt ribs     Traction   Type of Traction Cervical   Min (lbs) -   Max (lbs) 14   Hold Time static hold   Rest Time -   Time 19     Neck Exercises: Stretches   Levator Stretch 30 seconds  each side                      PT Long Term Goals - 03/15/17 1529      PT LONG TERM GOAL #1   Title demo lumbar/thoracic/cervical motion with no more than 25% limitations and no more than 2/10 pain ( 04/12/17)      PT LONG TERM GOAL #2   Title Be independent with advanced HEP (04/12/17)    Status On-going     PT LONG TERM GOAL #3   Title Report pain decrease =/>50% in her neck and rib with daily activity ( 04/12/17)    Status Achieved  50% improvement neck and arms, more than that for ribs     PT LONG TERM GOAL #4   Title demo strong contraction of TA and multifidi with core ther ex ( 04/12/17)    Status On-going     PT LONG TERM GOAL #5   Title Improve FOTO =/< 41% limited (CL level) (04/12/17)    Status On-going               Plan - 03/15/17 1549    Clinical Impression Statement Abigail [name] was sick earllier in the week, still getting better, Neck and rib pain have improved. She has met this goal and making slow progress to the others.  Her low back is what is bothering her the most.    Rehab Potential Good   PT Frequency 2x / week   PT Duration 6 weeks   PT Treatment/Interventions Moist Heat;Traction;Ultrasound;Therapeutic exercise;Dry needling;Manual techniques;Neuromuscular re-education;Cryotherapy;Electrical Stimulation;Patient/family education;Passive range of motion   PT Next Visit Plan continue spinal stabilization exercises and traction.  modalities as indicated.    Consulted and Agree with Plan of Care Patient      Patient will benefit from skilled therapeutic intervention in order to improve the following deficits and impairments:     Visit Diagnosis: Cervicalgia  Painful rib  Muscle weakness (generalized)  Joint stiffness of spine     Problem List Patient Active Problem List   Diagnosis Date Noted  . Furuncle of vulva 12/06/2016  . Bilateral carpal tunnel syndrome 11/23/2016  . Neck pain 11/23/2016  . Primary osteoarthritis of both first  carpometacarpal  joints 11/23/2016  . Diabetic nephropathy ([place]) 08/18/2016  . Right knee pain 01/03/2016  . Asymmetrical hearing loss of both ears 02/27/2015  . Pressure ulcer 11/10/2014  . Spondylolisthesis of lumbar region 11/07/2014  . CKD (chronic kidney disease), stage III ([place]) 07/24/2013  . Chest pain 07/23/2013  . Chronic pain 07/23/2013  . Fibromyalgia 07/23/2013  . Leukocytosis 02/07/2013  . Weakness generalized 02/07/2013  . Acute encephalopathy 02/05/2013  . Anemia 02/05/2013  . Acute renal failure ([place]) 02/05/2013  . Dehydration 09/12/2012  . Diabetes mellitus ([place]) 09/12/2012  . Hypertension 09/12/2012  . Hypothyroidism 09/12/2012  . Hyperlipidemia 09/12/2012    Boneta Lucks rPT  03/15/2017, 3:54 PM  [facility] [place], [place], [address] Phone: [phone]   Fax:  [phone]  Name: Abigail [name] MRN: [id number] Date of Birth: Oct 10, 1940

## 2017-03-21 ENCOUNTER — Ambulatory Visit (INDEPENDENT_AMBULATORY_CARE_PROVIDER_SITE_OTHER): Payer: Medicare Other | Admitting: Physical Therapy

## 2017-03-21 DIAGNOSIS — R0781 Pleurodynia: Secondary | ICD-10-CM | POA: Diagnosis not present

## 2017-03-21 DIAGNOSIS — M542 Cervicalgia: Secondary | ICD-10-CM | POA: Diagnosis present

## 2017-03-21 DIAGNOSIS — M6281 Muscle weakness (generalized): Secondary | ICD-10-CM | POA: Diagnosis not present

## 2017-03-21 DIAGNOSIS — M2560 Stiffness of unspecified joint, not elsewhere classified: Secondary | ICD-10-CM

## 2017-03-21 NOTE — Patient Instructions (Signed)
Over Head Pull: Narrow Grip        On back, knees bent, feet flat, band across thighs, elbows straight but relaxed. Pull hands apart (start). Keeping elbows straight, bring arms up and over head, hands toward floor. Keep pull steady on band. Hold momentarily. Return slowly, keeping pull steady, back to start. Repeat _10__ times. Band color __yellow____   Side Pull: Double Arm   On back, knees bent, feet flat. Arms perpendicular to body, shoulder level, elbows straight but relaxed. Pull arms out to sides, elbows straight. Resistance band comes across collarbones, hands toward floor. Hold momentarily. Slowly return to starting position. Repeat _10__ times. Band color __yellow___   Shoulder Rotation: Double Arm   On back, knees bent, feet flat, elbows tucked at sides, bent 90, hands palms up. Pull hands apart and down toward floor, keeping elbows near sides. Hold momentarily. Slowly return to starting position. Repeat _10__ times. Band color ___yellow___   * perform 2 sets.    [facility] [place], [place] [address]  [phone] (office) [phone] (fax)

## 2017-03-21 NOTE — Therapy (Signed)
[place], [place], [address] Phone: [phone]   Fax:  [phone]  Physical Therapy Treatment  Patient Details  Name: Abigail [name] MRN: [id number] Date of Birth: 02/13/1941 Referring Provider: Dr Broadus John Stern/Dr Melinda Crutch  Encounter Date: 03/21/2017      PT End of Session - 03/21/17 1123    Visit Number 5   Number of Visits 12   Date for PT Re-Evaluation 04/12/17   Authorization Type Gcode at 10th visit   PT Start Time 1105   PT Stop Time 1158   PT Time Calculation (min) 53 min   Activity Tolerance Patient tolerated treatment well   Behavior During Therapy [facility] for tasks assessed/performed      Past Medical History:  Diagnosis Date  . Boils    near vaginal area  . CAD (coronary artery disease)   . Chronic kidney disease    stage 3; low kidney function; BUN was high acc to pt  . Constipation due to pain medication   . Depression   . Diabetes mellitus    Type 2  . Family hx of colon cancer   . Fibromyalgia   . Frequency of urination   . Frequent urination at night   . GERD (gastroesophageal reflux disease)    pt denies  . Heart murmur   . History of bladder infections   . Hyperlipidemia   . Hypertension   . Hypothyroidism   . Itchy eyes   . Obesity   . Obesity   . PONV (postoperative nausea and vomiting)    with stapedectomy  . Torn rotator cuff left   current    Past Surgical History:  Procedure Laterality Date  . BACK SURGERY     lumbar fusion  . CHOLECYSTECTOMY  1971  . COLONOSCOPY    . DILATION AND CURETTAGE OF UTERUS    . MAXIMUM ACCESS (MAS)POSTERIOR LUMBAR INTERBODY FUSION (PLIF) 1 LEVEL N/A 11/07/2014   Procedure: Lumbar Four-Five Maximum access posterior lumbar interbody fusion;  Surgeon: Erline Levine, MD;  Location: [place];  Service: Neurosurgery;  Laterality: N/A;  L4-5 Maximum access posterior lumbar interbody fusion  . SHOULDER SURGERY     bilateral rotator cuff repairs  . STAPEDECTOMY Left    ear  . TUBAL LIGATION  1971    There were no vitals filed for this visit.      Subjective Assessment - 03/21/17 1109    Subjective "I overdid it at the pool last Friday".  Pt reports the cold weather has affected her joints, causing more pain.    Currently in Pain? Yes   Pain Score 4    Pain Location Arm   Pain Orientation Left   Pain Descriptors / Indicators Throbbing   Aggravating Factors  certain motions   Pain Relieving Factors heat            [facility] PT Assessment - 03/21/17 0001      AROM   Cervical - Right Rotation 65  supine   Cervical - Left Rotation 52  no pain          [facility] Adult PT Treatment/Exercise - 03/21/17 0001      Neck Exercises: Machines for Strengthening   UBE (Upper Arm Bike) L1: 2 min forward     Neck Exercises: Supine   Cervical Rotation Right;Left;5 reps   Shoulder Flexion Both;10 reps  overhead pull with yellow band   Other Supine Exercise bridge isometric (muscles  engaged, but not lifting hips) 5 sec hold x 5 reps; Leg lengthener with opp shoulder ext isometric x 5 sec hold x 10 reps each   Other Supine Exercise prolonged snow angel stretch with arms abdct ~80 deg x 30 sec x 3 reps; horiz abdct with yellow band x 10; bilat ER with yellow band x 10 reps     Moist Heat Therapy   Number Minutes Moist Heat 19 Minutes   Moist Heat Location Cervical     Traction   Type of Traction Cervical   Max (lbs) 14   Hold Time static hold   Time 19                PT Education - 03/21/17 1145    Education provided Yes   Education Details HEP    Person(s) Educated Patient   Methods Explanation;Handout;Verbal cues   Comprehension Verbalized understanding;Returned demonstration             PT Long Term Goals - 03/21/17 1203      PT LONG TERM GOAL #1   Title demo lumbar/thoracic/cervical motion with no more than 25% limitations and no more than 2/10 pain ( 04/12/17)    Time  6   Period Weeks   Status On-going     PT LONG TERM GOAL #2   Title Be independent with advanced HEP (04/12/17)    Time 6   Period Weeks   Status On-going     PT LONG TERM GOAL #3   Title Report pain decrease =/>50% in her neck and rib with daily activity ( 04/12/17)    Time 6   Period Weeks   Status Achieved     PT LONG TERM GOAL #4   Title demo strong contraction of TA and multifidi with core ther ex ( 04/12/17)    Time 6   Period Weeks   Status On-going     PT LONG TERM GOAL #5   Title Improve FOTO =/< 41% limited (CL level) (04/12/17)    Time 6   Period Weeks   Status On-going               Plan - 03/21/17 1200    Clinical Impression Statement Abigail [name] demonstrated improved Lt cervical rotation ROM, without production of pain. Pt was able to tolerated light resistance exercises for UE/neck in supine without increase in symptoms; added to HEP. Pt continues to make gradual progress towards goals. Pt reported reduction of Lt arm pain at end of session, no longer "throbbing".     Rehab Potential Good   PT Frequency 2x / week   PT Duration 6 weeks   PT Treatment/Interventions Moist Heat;Traction;Ultrasound;Therapeutic exercise;Dry needling;Manual techniques;Neuromuscular re-education;Cryotherapy;Electrical Stimulation;Patient/family education;Passive range of motion   PT Next Visit Plan continue spinal stabilization exercises and traction.  modalities as indicated. Add core (TA/multifidi) exercise to HEP, as tolerated.    Consulted and Agree with Plan of Care Patient      Patient will benefit from skilled therapeutic intervention in order to improve the following deficits and impairments:  Pain, Postural dysfunction, Decreased strength, Obesity, Decreased range of motion, Increased muscle spasms  Visit Diagnosis: Cervicalgia  Painful rib  Muscle weakness (generalized)  Joint stiffness of spine     Problem List Patient Active Problem List   Diagnosis Date  Noted  . Furuncle of vulva 12/06/2016  . Bilateral carpal tunnel syndrome 11/23/2016  . Neck pain 11/23/2016  . Primary osteoarthritis of both first carpometacarpal joints  11/23/2016  . Diabetic nephropathy ([place]) 08/18/2016  . Right knee pain 01/03/2016  . Asymmetrical hearing loss of both ears 02/27/2015  . Pressure ulcer 11/10/2014  . Spondylolisthesis of lumbar region 11/07/2014  . CKD (chronic kidney disease), stage III ([place]) 07/24/2013  . Chest pain 07/23/2013  . Chronic pain 07/23/2013  . Fibromyalgia 07/23/2013  . Leukocytosis 02/07/2013  . Weakness generalized 02/07/2013  . Acute encephalopathy 02/05/2013  . Anemia 02/05/2013  . Acute renal failure ([place]) 02/05/2013  . Dehydration 09/12/2012  . Diabetes mellitus ([place]) 09/12/2012  . Hypertension 09/12/2012  . Hypothyroidism 09/12/2012  . Hyperlipidemia 09/12/2012   Kerin Perna, PTA 03/21/17 12:10 PM  [place], [place], [address] Phone: [phone]   Fax:  [phone]  Name: Abigail [name] MRN: [id number] Date of Birth: [birth year]

## 2017-03-23 DIAGNOSIS — M5417 Radiculopathy, lumbosacral region: Secondary | ICD-10-CM | POA: Diagnosis not present

## 2017-03-23 DIAGNOSIS — Z6838 Body mass index (BMI) 38.0-38.9, adult: Secondary | ICD-10-CM | POA: Diagnosis not present

## 2017-03-23 DIAGNOSIS — M546 Pain in thoracic spine: Secondary | ICD-10-CM | POA: Diagnosis not present

## 2017-03-23 DIAGNOSIS — I10 Essential (primary) hypertension: Secondary | ICD-10-CM | POA: Diagnosis not present

## 2017-03-23 DIAGNOSIS — M542 Cervicalgia: Secondary | ICD-10-CM | POA: Diagnosis not present

## 2017-03-24 ENCOUNTER — Encounter: Payer: Self-pay | Admitting: Physical Therapy

## 2017-03-24 ENCOUNTER — Ambulatory Visit (INDEPENDENT_AMBULATORY_CARE_PROVIDER_SITE_OTHER): Payer: Medicare Other | Admitting: Physical Therapy

## 2017-03-24 DIAGNOSIS — M542 Cervicalgia: Secondary | ICD-10-CM | POA: Diagnosis not present

## 2017-03-24 DIAGNOSIS — M6281 Muscle weakness (generalized): Secondary | ICD-10-CM | POA: Diagnosis not present

## 2017-03-24 DIAGNOSIS — R0781 Pleurodynia: Secondary | ICD-10-CM | POA: Diagnosis present

## 2017-03-24 DIAGNOSIS — M2560 Stiffness of unspecified joint, not elsewhere classified: Secondary | ICD-10-CM

## 2017-03-24 NOTE — Therapy (Signed)
[place], [place], [address] Phone: [phone]   Fax:  [phone]  Physical Therapy Treatment  Patient Details  Name: Abigail [name] MRN: [id number] Date of Birth: 01/20/1941 Referring Provider: Dr Broadus John Stern/Dr Melinda Crutch  Encounter Date: 03/24/2017      PT End of Session - 03/24/17 1409    Visit Number 7  visit count was off   Number of Visits 12   Date for PT Re-Evaluation 04/12/17   Authorization Type Gcode at 10th visit   PT Start Time 1409   PT Stop Time 1456   PT Time Calculation (min) 47 min   Activity Tolerance Patient limited by pain      Past Medical History:  Diagnosis Date  . Boils    near vaginal area  . CAD (coronary artery disease)   . Chronic kidney disease    stage 3; low kidney function; BUN was high acc to pt  . Constipation due to pain medication   . Depression   . Diabetes mellitus    Type 2  . Family hx of colon cancer   . Fibromyalgia   . Frequency of urination   . Frequent urination at night   . GERD (gastroesophageal reflux disease)    pt denies  . Heart murmur   . History of bladder infections   . Hyperlipidemia   . Hypertension   . Hypothyroidism   . Itchy eyes   . Obesity   . Obesity   . PONV (postoperative nausea and vomiting)    with stapedectomy  . Torn rotator cuff left   current    Past Surgical History:  Procedure Laterality Date  . BACK SURGERY     lumbar fusion  . CHOLECYSTECTOMY  1971  . COLONOSCOPY    . DILATION AND CURETTAGE OF UTERUS    . MAXIMUM ACCESS (MAS)POSTERIOR LUMBAR INTERBODY FUSION (PLIF) 1 LEVEL N/A 11/07/2014   Procedure: Lumbar Four-Five Maximum access posterior lumbar interbody fusion;  Surgeon: Erline Levine, MD;  Location: [facility];  Service: Neurosurgery;  Laterality: N/A;  L4-5 Maximum access posterior lumbar interbody fusion  . SHOULDER SURGERY     bilateral rotator cuff repairs  . STAPEDECTOMY Left     ear  . TUBAL LIGATION  1971    There were no vitals filed for this visit.      Subjective Assessment - 03/24/17 1410    Subjective Marshell is not feeling well, having her normal pain, has some type of a yeast infection, she has a call into the MD, saw her PA yesterday and was told she needs to see Dr Vertell Limber, she did issue her some tramadol    Patient Stated Goals get rid of Rt rib pain   Currently in Pain? Yes   Pain Score 3   all over  generalized   Pain Orientation --   Pain Descriptors / Indicators Throbbing   Pain Type Chronic pain   Pain Onset More than a month ago   Pain Frequency Constant                         [facility] Adult PT Treatment/Exercise - 03/24/17 0001      Neck Exercises: Machines for Strengthening   UBE (Upper Arm Bike) L1x3' BWD     Neck Exercises: Standing   Other Standing Exercises isometrics, side bend, rotation,  pressing into ball for extension 15x2sec holds,  Modalities   Modalities Moist Heat;Traction     Moist Heat Therapy   Number Minutes Moist Heat 19 Minutes   Moist Heat Location Cervical     Traction   Type of Traction Cervical   Max (lbs) 16   Hold Time static hold   Time 19     Neck Exercises: Stretches   Levator Stretch 30 seconds   Other Neck Stretches shoulder and chest stretches.                      PT Long Term Goals - 03/21/17 1203      PT LONG TERM GOAL #1   Title demo lumbar/thoracic/cervical motion with no more than 25% limitations and no more than 2/10 pain ( 04/12/17)    Time 6   Period Weeks   Status On-going     PT LONG TERM GOAL #2   Title Be independent with advanced HEP (04/12/17)    Time 6   Period Weeks   Status On-going     PT LONG TERM GOAL #3   Title Report pain decrease =/>50% in her neck and rib with daily activity ( 04/12/17)    Time 6   Period Weeks   Status Achieved     PT LONG TERM GOAL #4   Title demo strong contraction of TA and multifidi with core  ther ex ( 04/12/17)    Time 6   Period Weeks   Status On-going     PT LONG TERM GOAL #5   Title Improve FOTO =/< 41% limited (CL level) (04/12/17)    Time 6   Period Weeks   Status On-going               Plan - 03/24/17 1437    Clinical Impression Statement Ronette reports her rib pain is just about all gone.  She continues with other body aches.  Not sure if traction is helping her.     Rehab Potential Good   PT Frequency 2x / week   PT Duration 6 weeks   PT Treatment/Interventions Moist Heat;Traction;Ultrasound;Therapeutic exercise;Dry needling;Manual techniques;Neuromuscular re-education;Cryotherapy;Electrical Stimulation;Patient/family education;Passive range of motion   PT Next Visit Plan continue spinal stabilization exercises and traction.  modalities as indicated. Add core (TA/multifidi) exercise to HEP, as tolerated.    Consulted and Agree with Plan of Care Patient      Patient will benefit from skilled therapeutic intervention in order to improve the following deficits and impairments:  Pain, Postural dysfunction, Decreased strength, Obesity, Decreased range of motion, Increased muscle spasms  Visit Diagnosis: Painful rib  Cervicalgia  Joint stiffness of spine  Muscle weakness (generalized)     Problem List Patient Active Problem List   Diagnosis Date Noted  . Furuncle of vulva 12/06/2016  . Bilateral carpal tunnel syndrome 11/23/2016  . Neck pain 11/23/2016  . Primary osteoarthritis of both first carpometacarpal joints 11/23/2016  . Diabetic nephropathy ([place]) 08/18/2016  . Right knee pain 01/03/2016  . Asymmetrical hearing loss of both ears 02/27/2015  . Pressure ulcer 11/10/2014  . Spondylolisthesis of lumbar region 11/07/2014  . CKD (chronic kidney disease), stage III ([place]) 07/24/2013  . Chest pain 07/23/2013  . Chronic pain 07/23/2013  . Fibromyalgia 07/23/2013  . Leukocytosis 02/07/2013  . Weakness generalized 02/07/2013  . Acute  encephalopathy 02/05/2013  . Anemia 02/05/2013  . Acute renal failure ([place]) 02/05/2013  . Dehydration 09/12/2012  . Diabetes mellitus ([place]) 09/12/2012  . Hypertension 09/12/2012  .  Hypothyroidism 09/12/2012  . Hyperlipidemia 09/12/2012    Jeral Pinch PT  03/24/2017, 2:45 PM  [facility] [place], [place], [address] Phone: [phone]   Fax:  [phone]  Name: SHAKIYLA KOOK MRN: [id number] Date of Birth: [birth year]

## 2017-03-29 ENCOUNTER — Ambulatory Visit (INDEPENDENT_AMBULATORY_CARE_PROVIDER_SITE_OTHER): Payer: Medicare Other | Admitting: Physical Therapy

## 2017-03-29 DIAGNOSIS — M2560 Stiffness of unspecified joint, not elsewhere classified: Secondary | ICD-10-CM

## 2017-03-29 DIAGNOSIS — M6281 Muscle weakness (generalized): Secondary | ICD-10-CM | POA: Diagnosis not present

## 2017-03-29 DIAGNOSIS — M542 Cervicalgia: Secondary | ICD-10-CM | POA: Diagnosis present

## 2017-03-29 NOTE — Therapy (Signed)
[place], [place], [address] Phone: [phone]   Fax:  [phone]  Physical Therapy Treatment  Patient Details  Name: Abigail [name] MRN: [id number] Date of Birth: [birth year] Referring Provider: Dr. Erline Levine   Encounter Date: 03/29/2017      PT End of Session - 03/29/17 1522    Visit Number 8   Number of Visits 12   Date for PT Re-Evaluation 04/12/17   Authorization Type Gcode at 10th visit   PT Start Time 1519   PT Stop Time 1605   PT Time Calculation (min) 46 min      Past Medical History:  Diagnosis Date  . Boils    near vaginal area  . CAD (coronary artery disease)   . Chronic kidney disease    stage 3; low kidney function; BUN was high acc to pt  . Constipation due to pain medication   . Depression   . Diabetes mellitus    Type 2  . Family hx of colon cancer   . Fibromyalgia   . Frequency of urination   . Frequent urination at night   . GERD (gastroesophageal reflux disease)    pt denies  . Heart murmur   . History of bladder infections   . Hyperlipidemia   . Hypertension   . Hypothyroidism   . Itchy eyes   . Obesity   . Obesity   . PONV (postoperative nausea and vomiting)    with stapedectomy  . Torn rotator cuff left   current    Past Surgical History:  Procedure Laterality Date  . BACK SURGERY     lumbar fusion  . CHOLECYSTECTOMY  1971  . COLONOSCOPY    . DILATION AND CURETTAGE OF UTERUS    . MAXIMUM ACCESS (MAS)POSTERIOR LUMBAR INTERBODY FUSION (PLIF) 1 LEVEL N/A 11/07/2014   Procedure: Lumbar Four-Five Maximum access posterior lumbar interbody fusion;  Surgeon: Erline Levine, MD;  Location: [facility];  Service: Neurosurgery;  Laterality: N/A;  L4-5 Maximum access posterior lumbar interbody fusion  . SHOULDER SURGERY     bilateral rotator cuff repairs  . STAPEDECTOMY Left    ear  . TUBAL LIGATION  1971    There were no vitals filed for this  visit.      Subjective Assessment - 03/29/17 1522    Subjective Abigail [name] states she is dealing with a yeast infection.  She feels like she has had "some" improvement of symptoms since initiating therapy.  She took tramadol over weekend and that has helped some. Her Rt rib pain has resolved.    Patient Stated Goals get rid of pain that is in neck, running down arms.    Currently in Pain? Yes   Pain Score 2    Pain Location Arm   Pain Orientation Left   Pain Descriptors / Indicators Aching   Aggravating Factors  certain motions   Pain Relieving Factors heat            [facility] PT Assessment - 03/29/17 0001      Assessment   Medical Diagnosis cervicalgia, Rt rib pain   Referring Provider Dr. Erline Levine    Onset Date/Surgical Date 03/01/16   Hand Dominance Right   Next MD Visit 04/05/17          [facility] Adult PT Treatment/Exercise - 03/29/17 0001      Neck Exercises: Machines for Strengthening   UBE (Upper Arm Bike) L1x3' BWD seated  Neck Exercises: Standing   Other Standing Exercises reverse wall push ups (back against wall, pressing elbows into wall) x 10 reps, 2 sets   Other Standing Exercises shoulder ext with cane x 10 reps      Neck Exercises: Supine   Shoulder Flexion Both;5 reps  overhead pull with red band   Other Supine Exercise horiz abdct with red band x 10; bilat ER with red band x 10 reps     Lumbar Exercises: Supine   Ab Set 5 reps;5 seconds   AB Set Limitations tactile and VC for technique, VC for breath   Bent Knee Raise 5 reps  with ab set   Other Supine Lumbar Exercises snow angels to tolerance x 10 reps     Moist Heat Therapy   Number Minutes Moist Heat 19 Minutes  during traction   Moist Heat Location Lumbar Spine;Cervical     Traction   Type of Traction Cervical   Min (lbs) -   Max (lbs) 14  pt req decrease   Hold Time static hold   Rest Time -   Time 11     Neck Exercises: Stretches   Other Neck Stretches mid and low level doorway  stretch x 30 sec x 2 reps each                     PT Long Term Goals - 03/21/17 1203      PT LONG TERM GOAL #1   Title demo lumbar/thoracic/cervical motion with no more than 25% limitations and no more than 2/10 pain ( 04/12/17)    Time 6   Period Weeks   Status On-going     PT LONG TERM GOAL #2   Title Be independent with advanced HEP (04/12/17)    Time 6   Period Weeks   Status On-going     PT LONG TERM GOAL #3   Title Report pain decrease =/>50% in her neck and rib with daily activity ( 04/12/17)    Time 6   Period Weeks   Status Achieved     PT LONG TERM GOAL #4   Title demo strong contraction of TA and multifidi with core ther ex ( 04/12/17)    Time 6   Period Weeks   Status On-going     PT LONG TERM GOAL #5   Title Improve FOTO =/< 41% limited (CL level) (04/12/17)    Time 6   Period Weeks   Status On-going               Plan - 03/29/17 1549    Clinical Impression Statement Pt tolerated increased resistance with UE exercises, but would like to continue using yellow band at home. Pt required multiple cues to engage TA.  Pt unable to tolerate traction - requested to end treatment at 11 min (despite adjustments to pull and positioning) Pt making gradual progress towards established goals.    Rehab Potential Good   PT Frequency 2x / week   PT Duration 6 weeks   PT Treatment/Interventions Moist Heat;Traction;Ultrasound;Therapeutic exercise;Dry needling;Manual techniques;Neuromuscular re-education;Cryotherapy;Electrical Stimulation;Patient/family education;Passive range of motion   PT Next Visit Plan continue spinal stabilization exercises and traction.  modalities as indicated. Add core (TA/multifidi) exercise to HEP, as tolerated.    Consulted and Agree with Plan of Care Patient      Patient will benefit from skilled therapeutic intervention in order to improve the following deficits and impairments:  Pain, Postural dysfunction, Decreased  strength, Obesity, Decreased range of motion, Increased muscle spasms  Visit Diagnosis: Cervicalgia  Joint stiffness of spine  Muscle weakness (generalized)     Problem List Patient Active Problem List   Diagnosis Date Noted  . Furuncle of vulva 12/06/2016  . Bilateral carpal tunnel syndrome 11/23/2016  . Neck pain 11/23/2016  . Primary osteoarthritis of both first carpometacarpal joints 11/23/2016  . Diabetic nephropathy ([place]) 08/18/2016  . Right knee pain 01/03/2016  . Asymmetrical hearing loss of both ears 02/27/2015  . Pressure ulcer 11/10/2014  . Spondylolisthesis of lumbar region 11/07/2014  . CKD (chronic kidney disease), stage III ([place]) 07/24/2013  . Chest pain 07/23/2013  . Chronic pain 07/23/2013  . Fibromyalgia 07/23/2013  . Leukocytosis 02/07/2013  . Weakness generalized 02/07/2013  . Acute encephalopathy 02/05/2013  . Anemia 02/05/2013  . Acute renal failure ([place]) 02/05/2013  . Dehydration 09/12/2012  . Diabetes mellitus ([place]) 09/12/2012  . Hypertension 09/12/2012  . Hypothyroidism 09/12/2012  . Hyperlipidemia 09/12/2012   Kerin Perna, PTA 03/29/17 5:12 PM  [place], [place], [address] Phone: [phone]   Fax:  [phone]  Name: Abigail [name] MRN: [id number] Date of Birth: December 24, 1940

## 2017-03-31 ENCOUNTER — Ambulatory Visit (INDEPENDENT_AMBULATORY_CARE_PROVIDER_SITE_OTHER): Payer: Medicare Other | Admitting: Physical Therapy

## 2017-03-31 DIAGNOSIS — M542 Cervicalgia: Secondary | ICD-10-CM | POA: Diagnosis present

## 2017-03-31 DIAGNOSIS — M2560 Stiffness of unspecified joint, not elsewhere classified: Secondary | ICD-10-CM

## 2017-03-31 DIAGNOSIS — M6281 Muscle weakness (generalized): Secondary | ICD-10-CM | POA: Diagnosis not present

## 2017-03-31 NOTE — Therapy (Signed)
[place], [place], [address] Phone: [phone]   Fax:  [phone]  Physical Therapy Treatment  Patient Details  Name: Abigail [name] MRN: [id number] Date of Birth: [birth year] Referring Provider: Dr. Erline Levine  Encounter Date: 03/31/2017      PT End of Session - 03/31/17 1532    Visit Number 9   Number of Visits 12   Date for PT Re-Evaluation 04/12/17   Authorization Type Gcode at 10th visit   PT Start Time 1450   PT Stop Time 1546  MHP last 20 min   PT Time Calculation (min) 56 min   Activity Tolerance Patient tolerated treatment well   Behavior During Therapy [facility] for tasks assessed/performed      Past Medical History:  Diagnosis Date  . Boils    near vaginal area  . CAD (coronary artery disease)   . Chronic kidney disease    stage 3; low kidney function; BUN was high acc to pt  . Constipation due to pain medication   . Depression   . Diabetes mellitus    Type 2  . Family hx of colon cancer   . Fibromyalgia   . Frequency of urination   . Frequent urination at night   . GERD (gastroesophageal reflux disease)    pt denies  . Heart murmur   . History of bladder infections   . Hyperlipidemia   . Hypertension   . Hypothyroidism   . Itchy eyes   . Obesity   . Obesity   . PONV (postoperative nausea and vomiting)    with stapedectomy  . Torn rotator cuff left   current    Past Surgical History:  Procedure Laterality Date  . BACK SURGERY     lumbar fusion  . CHOLECYSTECTOMY  1971  . COLONOSCOPY    . DILATION AND CURETTAGE OF UTERUS    . MAXIMUM ACCESS (MAS)POSTERIOR LUMBAR INTERBODY FUSION (PLIF) 1 LEVEL N/A 11/07/2014   Procedure: Lumbar Four-Five Maximum access posterior lumbar interbody fusion;  Surgeon: Erline Levine, MD;  Location: [facility];  Service: Neurosurgery;  Laterality: N/A;  L4-5 Maximum access posterior lumbar interbody fusion  . SHOULDER SURGERY     bilateral rotator cuff repairs  . STAPEDECTOMY Left    ear  . TUBAL LIGATION  1971    There were no vitals filed for this visit.      Subjective Assessment - 03/31/17 1453    Subjective Pt reports no new changes since last visit, other than she feels her yeast infection is spreading. She called her MD regarding this today.    Patient Stated Goals get rid of pain that is in neck, running down arms.    Currently in Pain? Yes   Pain Score 2    Pain Location Arm   Pain Orientation Left   Pain Descriptors / Indicators Aching   Aggravating Factors  certain motions   Pain Relieving Factors heat    Multiple Pain Sites Yes   Pain Score 3   Pain Location Back   Pain Orientation Lower   Pain Descriptors / Indicators Aching   Aggravating Factors  getting up and walking    Pain Relieving Factors laying down on heat, tramadol            [facility] PT Assessment - 03/31/17 0001      Assessment   Medical Diagnosis cervicalgia, Rt rib pain   Referring Provider Dr. Erline Levine  Onset Date/Surgical Date 03/01/16   Hand Dominance Right   Next MD Visit 04/05/17             [facility] Adult PT Treatment/Exercise - 03/31/17 0001      Neck Exercises: Machines for Strengthening   UBE (Upper Arm Bike) L1: 1 min forward/1 min backward, seated .     Neck Exercises: Supine   Shoulder Flexion Both;10 reps  overhead pull with yellow   Other Supine Exercise horiz abdct with yellow band x 10; bilat ER with yellow band x 10 reps, D2 flexion with yellow band x 10 each arm.      Lumbar Exercises: Supine   Ab Set 5 reps;5 seconds   AB Set Limitations VC to breath, count out loud   Clam 10 reps  ab set   Bent Knee Raise 10 reps  with ab set     Modalities   Modalities Moist Heat     Moist Heat Therapy   Number Minutes Moist Heat 20 Minutes   Moist Heat Location Lumbar Spine;Knee;Cervical     Manual Therapy   Manual Therapy Myofascial release;Scapular mobilization;Soft tissue  mobilization;Manual Traction   Manual therapy comments pt supine   Soft tissue mobilization STM to bilat cervical paraspinals; TPR to Lt rhomboid   Myofascial Release suboccipital release.    Scapular Mobilization Lt scapula in all directions    Manual Traction light manual cervical traction, 20 sec holds.      Neck Exercises: Stretches   Other Neck Stretches mid and low level doorway stretch x 30 sec x 3 reps each                     PT Long Term Goals - 03/21/17 1203      PT LONG TERM GOAL #1   Title demo lumbar/thoracic/cervical motion with no more than 25% limitations and no more than 2/10 pain ( 04/12/17)    Time 6   Period Weeks   Status On-going     PT LONG TERM GOAL #2   Title Be independent with advanced HEP (04/12/17)    Time 6   Period Weeks   Status On-going     PT LONG TERM GOAL #3   Title Report pain decrease =/>50% in her neck and rib with daily activity ( 04/12/17)    Time 6   Period Weeks   Status Achieved     PT LONG TERM GOAL #4   Title demo strong contraction of TA and multifidi with core ther ex ( 04/12/17)    Time 6   Period Weeks   Status On-going     PT LONG TERM GOAL #5   Title Improve FOTO =/< 41% limited (CL level) (04/12/17)    Time 6   Period Weeks   Status On-going               Plan - 03/31/17 1533    Clinical Impression Statement Pt tolerated supine core exercises with less cues on form and breathing.  No increase in pain with scapular strengthening.  She had palpable tightness in Lt cervical paraspinals and rhomboid; decreased with manual therapy.  Held traction since pt has had difficulty tolerating it last 2 sessions. Pt making gradual progress towards goals.    Rehab Potential Good   PT Frequency 2x / week   PT Duration 6 weeks   PT Treatment/Interventions Moist Heat;Traction;Ultrasound;Therapeutic exercise;Dry needling;Manual techniques;Neuromuscular re-education;Cryotherapy;Electrical  Stimulation;Patient/family education;Passive range of motion  PT Next Visit Plan Gcode, assess goals - 10th visit.    Consulted and Agree with Plan of Care Patient      Patient will benefit from skilled therapeutic intervention in order to improve the following deficits and impairments:  Pain, Postural dysfunction, Decreased strength, Obesity, Decreased range of motion, Increased muscle spasms  Visit Diagnosis: Cervicalgia  Joint stiffness of spine  Muscle weakness (generalized)     Problem List Patient Active Problem List   Diagnosis Date Noted  . Furuncle of vulva 12/06/2016  . Bilateral carpal tunnel syndrome 11/23/2016  . Neck pain 11/23/2016  . Primary osteoarthritis of both first carpometacarpal joints 11/23/2016  . Diabetic nephropathy ([place]) 08/18/2016  . Right knee pain 01/03/2016  . Asymmetrical hearing loss of both ears 02/27/2015  . Pressure ulcer 11/10/2014  . Spondylolisthesis of lumbar region 11/07/2014  . CKD (chronic kidney disease), stage III ([place]) 07/24/2013  . Chest pain 07/23/2013  . Chronic pain 07/23/2013  . Fibromyalgia 07/23/2013  . Leukocytosis 02/07/2013  . Weakness generalized 02/07/2013  . Acute encephalopathy 02/05/2013  . Anemia 02/05/2013  . Acute renal failure ([place]) 02/05/2013  . Dehydration 09/12/2012  . Diabetes mellitus ([place]) 09/12/2012  . Hypertension 09/12/2012  . Hypothyroidism 09/12/2012  . Hyperlipidemia 09/12/2012   Kerin Perna, PTA 03/31/17 4:33 PM  [place], [place], [address] Phone: [phone]   Fax:  [phone]  Name: Abigail [name] MRN: [id number] Date of Birth: Mar 17, 1941

## 2017-04-05 ENCOUNTER — Encounter: Payer: Medicare Other | Admitting: Physical Therapy

## 2017-04-05 DIAGNOSIS — M5412 Radiculopathy, cervical region: Secondary | ICD-10-CM | POA: Diagnosis not present

## 2017-04-05 DIAGNOSIS — M5417 Radiculopathy, lumbosacral region: Secondary | ICD-10-CM | POA: Diagnosis not present

## 2017-04-05 DIAGNOSIS — M542 Cervicalgia: Secondary | ICD-10-CM | POA: Diagnosis not present

## 2017-04-05 DIAGNOSIS — M4317 Spondylolisthesis, lumbosacral region: Secondary | ICD-10-CM | POA: Diagnosis not present

## 2017-04-05 DIAGNOSIS — Z6838 Body mass index (BMI) 38.0-38.9, adult: Secondary | ICD-10-CM | POA: Diagnosis not present

## 2017-04-05 DIAGNOSIS — R030 Elevated blood-pressure reading, without diagnosis of hypertension: Secondary | ICD-10-CM | POA: Diagnosis not present

## 2017-04-07 ENCOUNTER — Ambulatory Visit (INDEPENDENT_AMBULATORY_CARE_PROVIDER_SITE_OTHER): Payer: Medicare Other | Admitting: Physical Therapy

## 2017-04-07 DIAGNOSIS — M542 Cervicalgia: Secondary | ICD-10-CM | POA: Diagnosis present

## 2017-04-07 DIAGNOSIS — M2560 Stiffness of unspecified joint, not elsewhere classified: Secondary | ICD-10-CM

## 2017-04-07 DIAGNOSIS — M6281 Muscle weakness (generalized): Secondary | ICD-10-CM

## 2017-04-07 NOTE — Therapy (Addendum)
[place] [place], [place], [address] Phone: [phone]   Fax:  [phone]  Physical Therapy Treatment  Patient Details  Name: Abigail [name] MRN: [id number] Date of Birth: 12/09/1940 Referring Provider: Dr. Erline Levine    Encounter Date: 04/07/2017     Refer to flow sheet for full details of treatment.   Past Medical History:  Diagnosis Date  . Boils    near vaginal area  . CAD (coronary artery disease)   . Chronic kidney disease    stage 3; low kidney function; BUN was high acc to pt  . Constipation due to pain medication   . Depression   . Diabetes mellitus    Type 2  . Family hx of colon cancer   . Fibromyalgia   . Frequency of urination   . Frequent urination at night   . GERD (gastroesophageal reflux disease)    pt denies  . Heart murmur   . History of bladder infections   . Hyperlipidemia   . Hypertension   . Hypothyroidism   . Itchy eyes   . Obesity   . Obesity   . PONV (postoperative nausea and vomiting)    with stapedectomy  . Torn rotator cuff left   current    Past Surgical History:  Procedure Laterality Date  . BACK SURGERY     lumbar fusion  . CHOLECYSTECTOMY  1971  . COLONOSCOPY    . DILATION AND CURETTAGE OF UTERUS    . SHOULDER SURGERY     bilateral rotator cuff repairs  . STAPEDECTOMY Left    ear  . TUBAL LIGATION  1971    There were no vitals filed for this visit.                      PT Long Term Goals - 04/07/17 1620      PT LONG TERM GOAL #1   Title  demo lumbar/thoracic/cervical motion with no more than 25% limitations and no more than 2/10 pain ( 04/12/17)     Time  6    Period  Weeks    Status  Partially Met      PT LONG TERM GOAL #2   Title  Be independent with advanced HEP (04/12/17)     Time  6    Period  Weeks    Status  On-going      PT LONG TERM GOAL #3   Title  Report pain decrease =/>50% in her neck and rib  with daily activity ( 04/12/17)     Time  6    Period  Weeks    Status  Achieved      PT LONG TERM GOAL #4   Title  demo strong contraction of TA and multifidi with core ther ex ( 04/12/17)     Time  6    Period  Weeks    Status  Partially Met      PT LONG TERM GOAL #5   Title  Improve FOTO =/< 41% limited (CL level) (04/12/17)     Time  6    Period  Weeks    Status  Achieved              Patient will benefit from skilled therapeutic intervention in order to improve the following deficits and impairments:  Pain, Postural dysfunction, Decreased strength, Obesity, Decreased range of motion, Increased muscle spasms  Visit Diagnosis: Cervicalgia  Joint  stiffness of spine  Muscle weakness (generalized)   [facility] PT PB G-CODES - 2017/04/21 7218    Functional Assessment Tool Used   FOTO and professional judgement    Functional Limitations  Other PT primary    Other PT Primary Current Status (C8833)  At least 20 percent but less than 40 percent impaired, limited or restricted    Other PT Primary Goal Status (V4451)  At least 40 percent but less than 60 percent impaired, limited or restricted       Problem List Patient Active Problem List   Diagnosis Date Noted  . Furuncle of vulva 12/06/2016  . Bilateral carpal tunnel syndrome 11/23/2016  . Neck pain 11/23/2016  . Primary osteoarthritis of both first carpometacarpal joints 11/23/2016  . Diabetic nephropathy ([place]) 08/18/2016  . Right knee pain 01/03/2016  . Asymmetrical hearing loss of both ears 02/27/2015  . Pressure ulcer 11/10/2014  . Spondylolisthesis of lumbar region 11/07/2014  . CKD (chronic kidney disease), stage III ([place]) 07/24/2013  . Chest pain 07/23/2013  . Chronic pain 07/23/2013  . Fibromyalgia 07/23/2013  . Leukocytosis 02/07/2013  . Weakness generalized 02/07/2013  . Acute encephalopathy 02/05/2013  . Anemia 02/05/2013  . Acute renal failure ([place]) 02/05/2013  . Dehydration 09/12/2012  . Diabetes  mellitus ([place]) 09/12/2012  . Hypertension 09/12/2012  . Hypothyroidism 09/12/2012  . Hyperlipidemia 09/12/2012    Kerin Perna, PTA 04/21/17 6:51 AM   Jeral Pinch, PT 21-Apr-2017 6:51 AM   [facility] [place], [place], [address] Phone: [phone]   Fax:  [phone]  Name: Abigail [name] MRN: [id number] Date of Birth: [birth year]

## 2017-04-10 DIAGNOSIS — M25512 Pain in left shoulder: Secondary | ICD-10-CM | POA: Diagnosis not present

## 2017-04-10 DIAGNOSIS — M12812 Other specific arthropathies, not elsewhere classified, left shoulder: Secondary | ICD-10-CM | POA: Diagnosis not present

## 2017-04-11 ENCOUNTER — Encounter: Payer: Medicare Other | Admitting: Physical Therapy

## 2017-04-12 DIAGNOSIS — N760 Acute vaginitis: Secondary | ICD-10-CM | POA: Diagnosis not present

## 2017-04-13 ENCOUNTER — Encounter: Payer: Medicare Other | Admitting: Physical Therapy

## 2017-05-01 DIAGNOSIS — E1140 Type 2 diabetes mellitus with diabetic neuropathy, unspecified: Secondary | ICD-10-CM | POA: Diagnosis not present

## 2017-05-01 DIAGNOSIS — Z794 Long term (current) use of insulin: Secondary | ICD-10-CM | POA: Diagnosis not present

## 2017-05-01 DIAGNOSIS — B379 Candidiasis, unspecified: Secondary | ICD-10-CM | POA: Diagnosis not present

## 2017-05-01 DIAGNOSIS — I10 Essential (primary) hypertension: Secondary | ICD-10-CM | POA: Diagnosis not present

## 2017-05-03 DIAGNOSIS — N760 Acute vaginitis: Secondary | ICD-10-CM | POA: Diagnosis not present

## 2017-05-17 DIAGNOSIS — M180 Bilateral primary osteoarthritis of first carpometacarpal joints: Secondary | ICD-10-CM | POA: Diagnosis not present

## 2017-05-17 DIAGNOSIS — M19012 Primary osteoarthritis, left shoulder: Secondary | ICD-10-CM | POA: Diagnosis not present

## 2017-05-17 DIAGNOSIS — G5603 Carpal tunnel syndrome, bilateral upper limbs: Secondary | ICD-10-CM | POA: Diagnosis not present

## 2017-06-05 DIAGNOSIS — G5603 Carpal tunnel syndrome, bilateral upper limbs: Secondary | ICD-10-CM | POA: Diagnosis not present

## 2017-06-05 DIAGNOSIS — M180 Bilateral primary osteoarthritis of first carpometacarpal joints: Secondary | ICD-10-CM | POA: Diagnosis not present

## 2017-06-06 ENCOUNTER — Other Ambulatory Visit: Payer: Self-pay | Admitting: Orthopedic Surgery

## 2017-06-07 DIAGNOSIS — M542 Cervicalgia: Secondary | ICD-10-CM | POA: Diagnosis not present

## 2017-06-07 DIAGNOSIS — Z6837 Body mass index (BMI) 37.0-37.9, adult: Secondary | ICD-10-CM | POA: Diagnosis not present

## 2017-06-07 DIAGNOSIS — R030 Elevated blood-pressure reading, without diagnosis of hypertension: Secondary | ICD-10-CM | POA: Diagnosis not present

## 2017-06-07 DIAGNOSIS — M5417 Radiculopathy, lumbosacral region: Secondary | ICD-10-CM | POA: Diagnosis not present

## 2017-06-07 DIAGNOSIS — M5412 Radiculopathy, cervical region: Secondary | ICD-10-CM | POA: Diagnosis not present

## 2017-06-07 DIAGNOSIS — M545 Low back pain: Secondary | ICD-10-CM | POA: Diagnosis not present

## 2017-06-07 DIAGNOSIS — M19019 Primary osteoarthritis, unspecified shoulder: Secondary | ICD-10-CM | POA: Diagnosis not present

## 2017-06-12 DIAGNOSIS — R300 Dysuria: Secondary | ICD-10-CM | POA: Diagnosis not present

## 2017-06-12 DIAGNOSIS — J329 Chronic sinusitis, unspecified: Secondary | ICD-10-CM | POA: Diagnosis not present

## 2017-06-12 DIAGNOSIS — G5600 Carpal tunnel syndrome, unspecified upper limb: Secondary | ICD-10-CM | POA: Diagnosis not present

## 2017-06-12 DIAGNOSIS — M25512 Pain in left shoulder: Secondary | ICD-10-CM | POA: Diagnosis not present

## 2017-06-12 DIAGNOSIS — N390 Urinary tract infection, site not specified: Secondary | ICD-10-CM | POA: Diagnosis not present

## 2017-06-15 DIAGNOSIS — Z9889 Other specified postprocedural states: Secondary | ICD-10-CM | POA: Diagnosis not present

## 2017-06-15 DIAGNOSIS — M25512 Pain in left shoulder: Secondary | ICD-10-CM | POA: Diagnosis not present

## 2017-06-15 DIAGNOSIS — G8929 Other chronic pain: Secondary | ICD-10-CM | POA: Diagnosis not present

## 2017-06-15 DIAGNOSIS — M19012 Primary osteoarthritis, left shoulder: Secondary | ICD-10-CM | POA: Diagnosis not present

## 2017-06-20 DIAGNOSIS — Z6837 Body mass index (BMI) 37.0-37.9, adult: Secondary | ICD-10-CM | POA: Diagnosis not present

## 2017-06-20 DIAGNOSIS — M5412 Radiculopathy, cervical region: Secondary | ICD-10-CM | POA: Diagnosis not present

## 2017-06-20 DIAGNOSIS — M19019 Primary osteoarthritis, unspecified shoulder: Secondary | ICD-10-CM | POA: Diagnosis not present

## 2017-06-20 DIAGNOSIS — M545 Low back pain: Secondary | ICD-10-CM | POA: Diagnosis not present

## 2017-06-20 DIAGNOSIS — I10 Essential (primary) hypertension: Secondary | ICD-10-CM | POA: Diagnosis not present

## 2017-06-21 DIAGNOSIS — M1711 Unilateral primary osteoarthritis, right knee: Secondary | ICD-10-CM | POA: Diagnosis not present

## 2017-06-21 DIAGNOSIS — M25561 Pain in right knee: Secondary | ICD-10-CM | POA: Diagnosis not present

## 2017-06-21 DIAGNOSIS — M25562 Pain in left knee: Secondary | ICD-10-CM | POA: Diagnosis not present

## 2017-06-26 ENCOUNTER — Other Ambulatory Visit: Payer: Self-pay

## 2017-06-26 ENCOUNTER — Encounter (HOSPITAL_BASED_OUTPATIENT_CLINIC_OR_DEPARTMENT_OTHER): Payer: Self-pay | Admitting: *Deleted

## 2017-06-27 ENCOUNTER — Other Ambulatory Visit: Payer: Self-pay

## 2017-06-27 ENCOUNTER — Encounter (HOSPITAL_BASED_OUTPATIENT_CLINIC_OR_DEPARTMENT_OTHER)
Admission: RE | Admit: 2017-06-27 | Discharge: 2017-06-27 | Disposition: A | Discharge status: Home / Self Care | Payer: Medicare Other | Source: Ambulatory Visit | Attending: Orthopedic Surgery | Admitting: Orthopedic Surgery

## 2017-06-27 DIAGNOSIS — N183 Chronic kidney disease, stage 3 (moderate): Secondary | ICD-10-CM | POA: Diagnosis not present

## 2017-06-27 DIAGNOSIS — Z881 Allergy status to other antibiotic agents status: Secondary | ICD-10-CM | POA: Diagnosis not present

## 2017-06-27 DIAGNOSIS — Z880 Allergy status to penicillin: Secondary | ICD-10-CM | POA: Diagnosis not present

## 2017-06-27 DIAGNOSIS — I129 Hypertensive chronic kidney disease with stage 1 through stage 4 chronic kidney disease, or unspecified chronic kidney disease: Secondary | ICD-10-CM | POA: Diagnosis not present

## 2017-06-27 DIAGNOSIS — K219 Gastro-esophageal reflux disease without esophagitis: Secondary | ICD-10-CM | POA: Diagnosis not present

## 2017-06-27 DIAGNOSIS — E039 Hypothyroidism, unspecified: Secondary | ICD-10-CM | POA: Diagnosis not present

## 2017-06-27 DIAGNOSIS — G5601 Carpal tunnel syndrome, right upper limb: Secondary | ICD-10-CM | POA: Diagnosis not present

## 2017-06-27 DIAGNOSIS — E785 Hyperlipidemia, unspecified: Secondary | ICD-10-CM | POA: Diagnosis not present

## 2017-06-27 DIAGNOSIS — E1122 Type 2 diabetes mellitus with diabetic chronic kidney disease: Secondary | ICD-10-CM | POA: Diagnosis not present

## 2017-06-27 DIAGNOSIS — Z79899 Other long term (current) drug therapy: Secondary | ICD-10-CM | POA: Diagnosis not present

## 2017-06-27 DIAGNOSIS — M797 Fibromyalgia: Secondary | ICD-10-CM | POA: Diagnosis not present

## 2017-06-27 DIAGNOSIS — Z888 Allergy status to other drugs, medicaments and biological substances status: Secondary | ICD-10-CM | POA: Diagnosis not present

## 2017-06-27 DIAGNOSIS — M180 Bilateral primary osteoarthritis of first carpometacarpal joints: Secondary | ICD-10-CM | POA: Diagnosis not present

## 2017-06-27 LAB — BASIC METABOLIC PANEL
Anion gap: 12 (ref 5–15)
BUN: 30 mg/dL — ABNORMAL HIGH (ref 6–20)
CO2: 26 mmol/L (ref 22–32)
Calcium: 9.3 mg/dL (ref 8.9–10.3)
Chloride: 101 mmol/L (ref 101–111)
Creatinine, Ser: 1.49 mg/dL — ABNORMAL HIGH (ref 0.44–1.00)
GFR calc Af Amer: 38 mL/min — ABNORMAL LOW (ref >60)
GFR calc non Af Amer: 33 mL/min — ABNORMAL LOW (ref >60)
Glucose, Bld: 196 mg/dL — ABNORMAL HIGH (ref 65–99)
Potassium: 3.9 mmol/L (ref 3.5–5.1)
Sodium: 139 mmol/L (ref 135–145)

## 2017-06-27 NOTE — Progress Notes (Signed)
Dr Tobias Alexander viewed EKG earlier today and ok to proceed with surgery as planned.

## 2017-06-29 ENCOUNTER — Ambulatory Visit (HOSPITAL_BASED_OUTPATIENT_CLINIC_OR_DEPARTMENT_OTHER): Payer: Medicare Other | Admitting: Anesthesiology

## 2017-06-29 ENCOUNTER — Encounter (HOSPITAL_BASED_OUTPATIENT_CLINIC_OR_DEPARTMENT_OTHER)
Admission: RE | Disposition: A | Discharge status: Home / Self Care | Payer: Self-pay | Source: Ambulatory Visit | Attending: Orthopedic Surgery

## 2017-06-29 ENCOUNTER — Encounter (HOSPITAL_BASED_OUTPATIENT_CLINIC_OR_DEPARTMENT_OTHER): Payer: Self-pay | Admitting: Anesthesiology

## 2017-06-29 ENCOUNTER — Other Ambulatory Visit: Payer: Self-pay

## 2017-06-29 ENCOUNTER — Ambulatory Visit (HOSPITAL_BASED_OUTPATIENT_CLINIC_OR_DEPARTMENT_OTHER)
Admission: RE | Admit: 2017-06-29 | Discharge: 2017-06-29 | Disposition: A | Discharge status: Home / Self Care | Payer: Medicare Other | Source: Ambulatory Visit | Attending: Orthopedic Surgery | Admitting: Orthopedic Surgery

## 2017-06-29 DIAGNOSIS — K219 Gastro-esophageal reflux disease without esophagitis: Secondary | ICD-10-CM | POA: Insufficient documentation

## 2017-06-29 DIAGNOSIS — E039 Hypothyroidism, unspecified: Secondary | ICD-10-CM | POA: Diagnosis not present

## 2017-06-29 DIAGNOSIS — E785 Hyperlipidemia, unspecified: Secondary | ICD-10-CM | POA: Diagnosis not present

## 2017-06-29 DIAGNOSIS — E1122 Type 2 diabetes mellitus with diabetic chronic kidney disease: Secondary | ICD-10-CM | POA: Insufficient documentation

## 2017-06-29 DIAGNOSIS — Z881 Allergy status to other antibiotic agents status: Secondary | ICD-10-CM | POA: Insufficient documentation

## 2017-06-29 DIAGNOSIS — I129 Hypertensive chronic kidney disease with stage 1 through stage 4 chronic kidney disease, or unspecified chronic kidney disease: Secondary | ICD-10-CM | POA: Insufficient documentation

## 2017-06-29 DIAGNOSIS — Z888 Allergy status to other drugs, medicaments and biological substances status: Secondary | ICD-10-CM | POA: Insufficient documentation

## 2017-06-29 DIAGNOSIS — G5601 Carpal tunnel syndrome, right upper limb: Secondary | ICD-10-CM | POA: Insufficient documentation

## 2017-06-29 DIAGNOSIS — M797 Fibromyalgia: Secondary | ICD-10-CM | POA: Diagnosis not present

## 2017-06-29 DIAGNOSIS — N183 Chronic kidney disease, stage 3 (moderate): Secondary | ICD-10-CM | POA: Insufficient documentation

## 2017-06-29 DIAGNOSIS — G5603 Carpal tunnel syndrome, bilateral upper limbs: Secondary | ICD-10-CM | POA: Diagnosis not present

## 2017-06-29 DIAGNOSIS — Z79899 Other long term (current) drug therapy: Secondary | ICD-10-CM | POA: Insufficient documentation

## 2017-06-29 DIAGNOSIS — Z880 Allergy status to penicillin: Secondary | ICD-10-CM | POA: Insufficient documentation

## 2017-06-29 DIAGNOSIS — M180 Bilateral primary osteoarthritis of first carpometacarpal joints: Secondary | ICD-10-CM | POA: Insufficient documentation

## 2017-06-29 HISTORY — PX: CARPAL TUNNEL RELEASE: SHX101

## 2017-06-29 LAB — GLUCOSE, CAPILLARY
Glucose-Capillary: 158 mg/dL — ABNORMAL HIGH (ref 65–99)
Glucose-Capillary: 198 mg/dL — ABNORMAL HIGH (ref 65–99)

## 2017-06-29 SURGERY — CARPAL TUNNEL RELEASE
Anesthesia: Monitor Anesthesia Care | Site: Wrist | Laterality: Right

## 2017-06-29 MED ORDER — PROPOFOL 10 MG/ML IV BOLUS
INTRAVENOUS | Status: DC | PRN
  Administered 2017-06-29 (×2): 20 mg via INTRAVENOUS

## 2017-06-29 MED ORDER — MIDAZOLAM HCL 2 MG/2ML IJ SOLN
INTRAMUSCULAR | Status: AC
  Filled 2017-06-29: qty 2

## 2017-06-29 MED ORDER — FENTANYL CITRATE (PF) 100 MCG/2ML IJ SOLN
50.0000 ug | INTRAMUSCULAR | Status: DC | PRN
  Administered 2017-06-29 (×2): 50 ug via INTRAVENOUS

## 2017-06-29 MED ORDER — CHLORHEXIDINE GLUCONATE 4 % EX LIQD: 60.0000 mL | Freq: Once | CUTANEOUS | Status: DC

## 2017-06-29 MED ORDER — LIDOCAINE HCL (CARDIAC) 20 MG/ML IV SOLN
INTRAVENOUS | Status: DC | PRN
  Administered 2017-06-29: 30 mg via INTRAVENOUS

## 2017-06-29 MED ORDER — MIDAZOLAM HCL 2 MG/2ML IJ SOLN
1.0000 mg | INTRAMUSCULAR | Status: DC | PRN
  Administered 2017-06-29 (×2): 1 mg via INTRAVENOUS

## 2017-06-29 MED ORDER — SUCCINYLCHOLINE CHLORIDE 200 MG/10ML IV SOSY
PREFILLED_SYRINGE | INTRAVENOUS | Status: AC
  Filled 2017-06-29: qty 10

## 2017-06-29 MED ORDER — BUPIVACAINE HCL (PF) 0.25 % IJ SOLN
INTRAMUSCULAR | Status: DC | PRN
  Administered 2017-06-29: 9 mL

## 2017-06-29 MED ORDER — OXYCODONE HCL 5 MG PO TABS: 5.0000 mg | ORAL_TABLET | Freq: Once | ORAL | Status: DC | PRN

## 2017-06-29 MED ORDER — VANCOMYCIN HCL IN DEXTROSE 1-5 GM/200ML-% IV SOLN
INTRAVENOUS | Status: AC
  Filled 2017-06-29: qty 200

## 2017-06-29 MED ORDER — ACETAMINOPHEN 160 MG/5ML PO SOLN: 325.0000 mg | ORAL | Status: DC | PRN

## 2017-06-29 MED ORDER — LACTATED RINGERS IV SOLN
INTRAVENOUS | Status: DC
  Administered 2017-06-29: 09:00:00 via INTRAVENOUS

## 2017-06-29 MED ORDER — OXYCODONE HCL 5 MG/5ML PO SOLN: 5.0000 mg | Freq: Once | ORAL | Status: DC | PRN

## 2017-06-29 MED ORDER — ONDANSETRON HCL 4 MG/2ML IJ SOLN
INTRAMUSCULAR | Status: AC
  Filled 2017-06-29: qty 2

## 2017-06-29 MED ORDER — FENTANYL CITRATE (PF) 100 MCG/2ML IJ SOLN: 25.0000 ug | INTRAMUSCULAR | Status: DC | PRN

## 2017-06-29 MED ORDER — EPHEDRINE 5 MG/ML INJ
INTRAVENOUS | Status: AC
  Filled 2017-06-29: qty 10

## 2017-06-29 MED ORDER — FENTANYL CITRATE (PF) 100 MCG/2ML IJ SOLN
INTRAMUSCULAR | Status: AC
  Filled 2017-06-29: qty 2

## 2017-06-29 MED ORDER — LIDOCAINE 2% (20 MG/ML) 5 ML SYRINGE
INTRAMUSCULAR | Status: AC
  Filled 2017-06-29: qty 5

## 2017-06-29 MED ORDER — ACETAMINOPHEN 325 MG PO TABS: 325.0000 mg | ORAL_TABLET | ORAL | Status: DC | PRN

## 2017-06-29 MED ORDER — TRAMADOL HCL 50 MG PO TABS: 50.0000 mg | ORAL_TABLET | Freq: Four times a day (QID) | ORAL | 0 refills | Status: DC | PRN

## 2017-06-29 MED ORDER — SCOPOLAMINE 1 MG/3DAYS TD PT72: 1.0000 | MEDICATED_PATCH | Freq: Once | TRANSDERMAL | Status: DC | PRN

## 2017-06-29 MED ORDER — VANCOMYCIN HCL IN DEXTROSE 1-5 GM/200ML-% IV SOLN
1000.0000 mg | INTRAVENOUS | Status: AC
  Administered 2017-06-29: 1000 mg via INTRAVENOUS

## 2017-06-29 MED ORDER — PHENYLEPHRINE 40 MCG/ML (10ML) SYRINGE FOR IV PUSH (FOR BLOOD PRESSURE SUPPORT)
PREFILLED_SYRINGE | INTRAVENOUS | Status: AC
  Filled 2017-06-29: qty 10

## 2017-06-29 SURGICAL SUPPLY — 34 items
BLADE SURG 15 STRL LF DISP TIS (BLADE) ×1 IMPLANT
BLADE SURG 15 STRL SS (BLADE) ×1
BNDG COHESIVE 3X5 TAN STRL LF (GAUZE/BANDAGES/DRESSINGS) ×2 IMPLANT
BNDG ESMARK 4X9 LF (GAUZE/BANDAGES/DRESSINGS) IMPLANT
BNDG GAUZE ELAST 4 BULKY (GAUZE/BANDAGES/DRESSINGS) ×2 IMPLANT
CHLORAPREP W/TINT 26ML (MISCELLANEOUS) ×2 IMPLANT
CORD BIPOLAR FORCEPS 12FT (ELECTRODE) ×2 IMPLANT
COVER BACK TABLE 60X90IN (DRAPES) ×2 IMPLANT
COVER MAYO STAND STRL (DRAPES) ×2 IMPLANT
CUFF TOURNIQUET SINGLE 18IN (TOURNIQUET CUFF) ×2 IMPLANT
DRAPE EXTREMITY T 121X128X90 (DRAPE) ×2 IMPLANT
DRAPE SURG 17X23 STRL (DRAPES) ×2 IMPLANT
DRSG PAD ABDOMINAL 8X10 ST (GAUZE/BANDAGES/DRESSINGS) ×2 IMPLANT
GAUZE SPONGE 4X4 12PLY STRL (GAUZE/BANDAGES/DRESSINGS) ×2 IMPLANT
GAUZE XEROFORM 1X8 LF (GAUZE/BANDAGES/DRESSINGS) ×2 IMPLANT
GLOVE BIO SURGEON STRL SZ 6.5 (GLOVE) ×2 IMPLANT
GLOVE BIOGEL PI IND STRL 7.0 (GLOVE) ×3 IMPLANT
GLOVE BIOGEL PI IND STRL 8.5 (GLOVE) ×1 IMPLANT
GLOVE BIOGEL PI INDICATOR 7.0 (GLOVE) ×3
GLOVE BIOGEL PI INDICATOR 8.5 (GLOVE) ×1
GLOVE SURG ORTHO 8.0 STRL STRW (GLOVE) ×2 IMPLANT
GOWN STRL REUS W/ TWL LRG LVL3 (GOWN DISPOSABLE) ×1 IMPLANT
GOWN STRL REUS W/TWL LRG LVL3 (GOWN DISPOSABLE) ×1
GOWN STRL REUS W/TWL XL LVL3 (GOWN DISPOSABLE) ×2 IMPLANT
NEEDLE PRECISIONGLIDE 27X1.5 (NEEDLE) ×2 IMPLANT
NS IRRIG 1000ML POUR BTL (IV SOLUTION) ×2 IMPLANT
PACK BASIN DAY SURGERY FS (CUSTOM PROCEDURE TRAY) ×2 IMPLANT
STOCKINETTE 4X48 STRL (DRAPES) ×2 IMPLANT
SUT ETHILON 4 0 PS 2 18 (SUTURE) ×2 IMPLANT
SUT VICRYL 4-0 PS2 18IN ABS (SUTURE) IMPLANT
SYR BULB 3OZ (MISCELLANEOUS) ×2 IMPLANT
SYR CONTROL 10ML LL (SYRINGE) ×2 IMPLANT
TOWEL OR 17X24 6PK STRL BLUE (TOWEL DISPOSABLE) ×2 IMPLANT
UNDERPAD 30X30 (UNDERPADS AND DIAPERS) ×2 IMPLANT

## 2017-06-29 NOTE — H&P (Signed)
Abigail [name] is an 77 y.o. female.   Chief Complaint: numbness right hand HPI: Abigail [name] is a 77 yo female with bilateral carpal tunnel syndrome bilateral CMC arthritis.  She had an injection at that time to the carpometacarpal joint of her right thumb. She is complaining now of pain numbness and tingling in the median nerve distribution. She states that she has had multiple injections to this is not desires proceeding along further injections. She states it is been bad since Christmas with the inability to use her hand secondary to the numbness and tingling. Keeping her awake at night. Is not complaining of the thumb at the present time. She is not complaining of her left side being significantly involved. She has a history of diabetes thyroid problems arthritis no history of gout. Family history is positive diabetes negative for thyroid problems arthritis and gout. She states nothing makes it better. She has been wearing her splints which have not given her relief.      Past Medical History:  Diagnosis Date  . Boils    near vaginal area  . Chronic kidney disease    stage 3; low kidney function; BUN was high acc to pt  . Constipation due to pain medication   . Depression   . Diabetes mellitus    Type 2  . Family hx of colon cancer   . Fibromyalgia   . Frequency of urination   . Frequent urination at night   . GERD (gastroesophageal reflux disease)    pt denies  . Heart murmur   . History of bladder infections   . Hyperlipidemia   . Hypertension   . Hypothyroidism   . Itchy eyes   . Obesity   . Obesity   . PONV (postoperative nausea and vomiting)    with stapedectomy  . Torn rotator cuff left   current    Past Surgical History:  Procedure Laterality Date  . BACK SURGERY     lumbar fusion  . CHOLECYSTECTOMY  1971  . COLONOSCOPY    . DILATION AND CURETTAGE OF UTERUS    . MAXIMUM ACCESS (MAS)POSTERIOR LUMBAR INTERBODY FUSION (PLIF) 1 LEVEL N/A 11/07/2014   Procedure:  Lumbar Four-Five Maximum access posterior lumbar interbody fusion;  Surgeon: Erline Levine, MD;  Location: [facility];  Service: Neurosurgery;  Laterality: N/A;  L4-5 Maximum access posterior lumbar interbody fusion  . SHOULDER SURGERY     bilateral rotator cuff repairs  . STAPEDECTOMY Left    ear  . TUBAL LIGATION  1971    Family History  Problem Relation Age of Onset  . Colon cancer Brother   . Diabetes Mother        and Sister   . Uterine cancer Sister    Social History:  reports that  has never smoked. she has never used smokeless tobacco. She reports that she does not drink alcohol or use drugs.  Allergies:  Allergies  Allergen Reactions  . Nsaids Other (See Comments)    Stage 3 kidney disease  . Onglyza [Saxagliptin] Swelling    Made legs and ankles swell  . Penicillins Hives  . Polysporin [Bacitracin-Polymyxin B] Dermatitis and Other (See Comments)    Allergic to ointments such as polysporin, neosporin, cortisporin  . Cephalosporins Other (See Comments)  . Corticotropin Rash  . Lisinopril Cough  . Mucinex [Guaifenesin Er] Rash  . Victoza [Liraglutide] Other (See Comments)    constipation    No medications prior to admission.  Results for orders placed or performed during the hospital encounter of 06/29/17 (from the past 48 hour(s))  Basic metabolic panel     Status: Abnormal   Collection Time: 06/27/17  1:00 PM  Result Value Ref Range   Sodium 139 135 - 145 mmol/L   Potassium 3.9 3.5 - 5.1 mmol/L   Chloride 101 101 - 111 mmol/L   CO2 26 22 - 32 mmol/L   Glucose, Bld 196 (H) 65 - 99 mg/dL   BUN 30 (H) 6 - 20 mg/dL   Creatinine, Ser 1.49 (H) 0.44 - 1.00 mg/dL   Calcium 9.3 8.9 - 10.3 mg/dL   GFR calc non Af Amer 33 (L) >60 mL/min   GFR calc Af Amer 38 (L) >60 mL/min    Comment: (NOTE) The eGFR has been calculated using the CKD EPI equation. This calculation has not been validated in all clinical situations. eGFR's persistently <60 mL/min signify possible  Chronic Kidney Disease.    Anion gap 12 5 - 15    No results found.   Pertinent items are noted in HPI.  Height 5' 2.5" (1.588 m), weight 98 kg (216 lb).  General appearance: alert, cooperative and appears stated age Head: Normocephalic, without obvious abnormality Neck: no JVD Resp: clear to auscultation bilaterally Cardio: regular rate and rhythm, S1, S2 normal, no murmur, click, rub or gallop GI: soft, non-tender; bowel sounds normal; no masses,  no organomegaly Extremities:  numbness right hand Pulses: 2+ and symmetric Skin: Skin color, texture, turgor normal. No rashes or lesions Neurologic: Grossly normal Incision/Wound: na  Assessment/Plan Assessment:  1. Bilateral carpal tunnel syndrome  2. 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 We have discussed possibility of treatment of both the arthritis and the carpal tunnel. Would recommend treatment of the carpal tunnel only at the present time and that she is complaining more of the carpal tunnel rather than the [facility] joint. In that she is a caregiver for her husband this would create problems for her over the amount of time it would take for healing of the [facility] arthritis. She is scheduled for carpal tunnel syndrome right hand as an outpatient under regional anesthesia. Pre-peri-and postoperative course were discussed along with risks and complications. She is aware there is no guarantee to the surgery the possibility of infection recurrence injury to arteries nerves tendons complete relief symptoms dystrophy.      Adlee Paar R 06/29/2017, 5:20 AM

## 2017-06-29 NOTE — Transfer of Care (Signed)
Immediate Anesthesia Transfer of Care Note  Patient: Abigail [name]  Procedure(s) Performed: RIGHT CARPAL TUNNEL RELEASE (Right Wrist)  Patient Location: PACU  Anesthesia Type:MAC and Bier block  Level of Consciousness: awake, alert  and oriented  Airway & Oxygen Therapy: Patient Spontanous Breathing and Patient connected to face mask oxygen  Post-op Assessment: Report given to RN and Post -op Vital signs reviewed and stable  Post vital signs: Reviewed and stable  Last Vitals:  Vitals:   06/29/17 0854  BP: (!) 146/54  Pulse: (!) 59  Resp: 20  Temp: 37.1 C  SpO2: 100%    Last Pain:  Vitals:   06/29/17 0854  TempSrc: Oral  PainSc: 3          Complications: No apparent anesthesia complications

## 2017-06-29 NOTE — Op Note (Signed)
NAMEDANEKA, LANTIGUA               ACCOUNT NO.:  [id number]  MEDICAL RECORD NO.:  [id number]  LOCATION:                                 FACILITY:  PHYSICIAN:  Daryll Brod, M.D.            DATE OF BIRTH:  DATE OF PROCEDURE:  06/29/2017 DATE OF DISCHARGE:                              OPERATIVE REPORT   PREOPERATIVE DIAGNOSIS:  Carpal tunnel syndrome, right hand.  POSTOPERATIVE DIAGNOSIS:  Carpal tunnel syndrome, right hand.  OPERATION:  Decompression of right median nerve.  SURGEON:  Daryll Brod, MD.  ASSISTANT:  None.  ANESTHESIA:  Forearm-based IV regional with local infiltration.  PLACE OF SURGERY:  [facility].  ANESTHESIOLOGIST:  [place].  HISTORY:  The patient is a 77 year old female with a history of numbness and tingling of bilateral hands.  Nerve conductions are positive.  She has elected to undergo surgical decompression of the right median nerve. Pre, peri, and postoperative courses have been discussed along with risks and complications.  She is aware that there is no guarantee to the surgery, the possibility of infection, recurrence of injury to arteries, nerves, and tendons, incomplete relief of symptoms, and dystrophy.  In the preoperative area, the patient is seen, the extremity marked by both patient and surgeon, antibiotic given.  DESCRIPTION OF PROCEDURE:  The patient was brought to the operating room where a forearm-based IV regional anesthetic was carried out without difficulty under the direction of the Anesthesia Department.  She was prepped using ChloraPrep in supine position with the right arm free.  A 3-minute dry time was allowed and time-out was taken confirming the patient and procedure.  A longitudinal incision was made in the right palm and carried down through subcutaneous tissue.  Bleeders were electrocauterized with bipolar.  The palmar fascia was split.  The superficial palmar arch was identified along with the flexor tendon to the  ring and little fingers.  The flexor tendons and median nerve were retracted radially and the ulnar nerve ulnarly.  The flexor retinaculum was then incised with sharp dissection.  A right angle and Sewell retractor were placed between skin and forearm.  This was bluntly dissected with blunt scissors.  The deep structures were also dissected free with blunt scissors.  The flexor retinaculum and distal forearm fascia were released for approximately 2 cm proximal to the wrist crease under direct vision.  The canal was explored and nerve was extremely hyperemic, compressed.  The motor branch entered into muscle distally. The wound was copiously irrigated with saline.  The skin was then closed with interrupted 4-0 nylon sutures.  A local infiltration with 0.25% bupivacaine without epinephrine was given; 9 mL was used.  A sterile compressive dressing with the fingers free was applied.  On deflation of the tourniquet, all fingers immediately pinked.  She was taken to the recovery room for observation in satisfactory condition.  She will be discharged to home, to return to the [facility] in 1 week, on tramadol.          ______________________________ Daryll Brod, M.D.     GK/MEDQ  D:  06/29/2017  T:  06/29/2017  Job:  [id number]

## 2017-06-29 NOTE — Anesthesia Preprocedure Evaluation (Signed)
Anesthesia Evaluation  Patient identified by MRN, date of birth, ID band Patient awake    Reviewed: Allergy & Precautions, NPO status , Patient's Chart, lab work & pertinent test results, reviewed documented beta blocker date and time   History of Anesthesia Complications (+) PONV and history of anesthetic complications  Airway Mallampati: II  TM Distance: >3 FB Neck ROM: Full    Dental  (+) Teeth Intact   Pulmonary neg pulmonary ROS,    breath sounds clear to auscultation       Cardiovascular hypertension, Pt. on medications and Pt. on home beta blockers (-) angina(-) Past MI and (-) CHF (-) dysrhythmias  Rhythm:Regular     Neuro/Psych PSYCHIATRIC DISORDERS Depression  Neuromuscular disease    GI/Hepatic GERD  Medicated and Controlled,  Endo/Other  diabetes, Type 2Hypothyroidism Morbid obesity  Renal/GU CRFRenal disease     Musculoskeletal  (+) Fibromyalgia -  Abdominal   Peds  Hematology negative hematology ROS (+)   Anesthesia Other Findings   Reproductive/Obstetrics                             Anesthesia Physical Anesthesia Plan  ASA: III  Anesthesia Plan: MAC and Bier Block and Bier Block-LIDOCAINE ONLY   Post-op Pain Management:    Induction:   PONV Risk Score and Plan: 3 and Treatment may vary due to age or medical condition  Airway Management Planned: Nasal Cannula  Additional Equipment:   Intra-op Plan:   Post-operative Plan:   Informed Consent: I have reviewed the patients History and Physical, chart, labs and discussed the procedure including the risks, benefits and alternatives for the proposed anesthesia with the patient or authorized representative who has indicated his/her understanding and acceptance.   Dental advisory given  Plan Discussed with: CRNA and Surgeon  Anesthesia Plan Comments:         Anesthesia Quick Evaluation

## 2017-06-29 NOTE — Discharge Instructions (Addendum)

## 2017-06-29 NOTE — Op Note (Signed)
Dictation Number [id number]

## 2017-06-29 NOTE — Brief Op Note (Signed)
06/29/2017  10:15 AM  PATIENT:  Abigail [name]  77 y.o. female  PRE-OPERATIVE DIAGNOSIS:  RIGHT CARPAL TUNNEL SYNDROME  POST-OPERATIVE DIAGNOSIS:  RIGHT CARPAL TUNNEL SYNDROME  PROCEDURE:  Procedure(s): RIGHT CARPAL TUNNEL RELEASE (Right)  SURGEON:  Surgeon(s) and Role:    * Daryll Brod, MD - Primary  PHYSICIAN ASSISTANT:   ASSISTANTS: none   ANESTHESIA:   local, regional and IV sedation  EBL:  2 mL   BLOOD ADMINISTERED:none  DRAINS: none   LOCAL MEDICATIONS USED:  BUPIVICAINE   SPECIMEN:  No Specimen  DISPOSITION OF SPECIMEN:  N/A  COUNTS:  YES  TOURNIQUET:   Total Tourniquet Time Documented: Forearm (Right) - 19 minutes Total: Forearm (Right) - 19 minutes   DICTATION: .Other Dictation: Dictation Number [id number]  PLAN OF CARE: Discharge to home after PACU  PATIENT DISPOSITION:  PACU - hemodynamically stable.

## 2017-06-29 NOTE — Anesthesia Postprocedure Evaluation (Signed)
Anesthesia Post Note  Patient: Abigail [name]  Procedure(s) Performed: RIGHT CARPAL TUNNEL RELEASE (Right Wrist)     Patient location during evaluation: PACU Anesthesia Type: MAC and Bier Block Level of consciousness: awake and alert Pain management: pain level controlled Vital Signs Assessment: post-procedure vital signs reviewed and stable Respiratory status: spontaneous breathing, nonlabored ventilation, respiratory function stable and patient connected to nasal cannula oxygen Cardiovascular status: stable and blood pressure returned to baseline Postop Assessment: no apparent nausea or vomiting Anesthetic complications: no    Last Vitals:  Vitals:   06/29/17 1115 06/29/17 1145  BP: (!) 102/42 (!) 150/68  Pulse: 60 67  Resp: 11 16  Temp:  36.7 C  SpO2:  98%    Last Pain:  Vitals:   06/29/17 1145  TempSrc:   PainSc: 0-No pain                 Tracy Gerken

## 2017-07-03 ENCOUNTER — Encounter (HOSPITAL_BASED_OUTPATIENT_CLINIC_OR_DEPARTMENT_OTHER): Payer: Self-pay | Admitting: Orthopedic Surgery

## 2017-07-26 DIAGNOSIS — Z4789 Encounter for other orthopedic aftercare: Secondary | ICD-10-CM | POA: Diagnosis not present

## 2017-07-28 DIAGNOSIS — M12812 Other specific arthropathies, not elsewhere classified, left shoulder: Secondary | ICD-10-CM | POA: Diagnosis not present

## 2017-07-28 DIAGNOSIS — M25512 Pain in left shoulder: Secondary | ICD-10-CM | POA: Diagnosis not present

## 2017-08-02 DIAGNOSIS — M5412 Radiculopathy, cervical region: Secondary | ICD-10-CM | POA: Diagnosis not present

## 2017-08-02 DIAGNOSIS — M19019 Primary osteoarthritis, unspecified shoulder: Secondary | ICD-10-CM | POA: Diagnosis not present

## 2017-08-02 DIAGNOSIS — M545 Low back pain: Secondary | ICD-10-CM | POA: Diagnosis not present

## 2017-08-02 DIAGNOSIS — Z6837 Body mass index (BMI) 37.0-37.9, adult: Secondary | ICD-10-CM | POA: Diagnosis not present

## 2017-08-10 DIAGNOSIS — Z79899 Other long term (current) drug therapy: Secondary | ICD-10-CM | POA: Diagnosis not present

## 2017-08-10 DIAGNOSIS — Z01818 Encounter for other preprocedural examination: Secondary | ICD-10-CM | POA: Diagnosis not present

## 2017-08-10 DIAGNOSIS — M19012 Primary osteoarthritis, left shoulder: Secondary | ICD-10-CM | POA: Diagnosis not present

## 2017-08-16 DIAGNOSIS — I10 Essential (primary) hypertension: Secondary | ICD-10-CM | POA: Diagnosis not present

## 2017-08-16 DIAGNOSIS — N3000 Acute cystitis without hematuria: Secondary | ICD-10-CM | POA: Diagnosis not present

## 2017-08-16 DIAGNOSIS — M170 Bilateral primary osteoarthritis of knee: Secondary | ICD-10-CM | POA: Diagnosis not present

## 2017-08-16 DIAGNOSIS — M25561 Pain in right knee: Secondary | ICD-10-CM | POA: Diagnosis not present

## 2017-08-16 DIAGNOSIS — M12812 Other specific arthropathies, not elsewhere classified, left shoulder: Secondary | ICD-10-CM | POA: Diagnosis not present

## 2017-08-16 DIAGNOSIS — M25562 Pain in left knee: Secondary | ICD-10-CM | POA: Diagnosis not present

## 2017-08-17 DIAGNOSIS — N182 Chronic kidney disease, stage 2 (mild): Secondary | ICD-10-CM | POA: Diagnosis not present

## 2017-08-17 DIAGNOSIS — E1122 Type 2 diabetes mellitus with diabetic chronic kidney disease: Secondary | ICD-10-CM | POA: Diagnosis not present

## 2017-08-17 DIAGNOSIS — I10 Essential (primary) hypertension: Secondary | ICD-10-CM | POA: Diagnosis not present

## 2017-08-24 DIAGNOSIS — M75112 Incomplete rotator cuff tear or rupture of left shoulder, not specified as traumatic: Secondary | ICD-10-CM | POA: Diagnosis present

## 2017-08-24 DIAGNOSIS — N183 Chronic kidney disease, stage 3 (moderate): Secondary | ICD-10-CM | POA: Diagnosis present

## 2017-08-24 DIAGNOSIS — I129 Hypertensive chronic kidney disease with stage 1 through stage 4 chronic kidney disease, or unspecified chronic kidney disease: Secondary | ICD-10-CM | POA: Diagnosis present

## 2017-08-24 DIAGNOSIS — M75102 Unspecified rotator cuff tear or rupture of left shoulder, not specified as traumatic: Secondary | ICD-10-CM | POA: Diagnosis not present

## 2017-08-24 DIAGNOSIS — M1711 Unilateral primary osteoarthritis, right knee: Secondary | ICD-10-CM | POA: Diagnosis present

## 2017-08-24 DIAGNOSIS — M12812 Other specific arthropathies, not elsewhere classified, left shoulder: Secondary | ICD-10-CM | POA: Diagnosis not present

## 2017-08-24 DIAGNOSIS — E785 Hyperlipidemia, unspecified: Secondary | ICD-10-CM | POA: Diagnosis present

## 2017-08-24 DIAGNOSIS — Z981 Arthrodesis status: Secondary | ICD-10-CM | POA: Diagnosis not present

## 2017-08-24 DIAGNOSIS — E669 Obesity, unspecified: Secondary | ICD-10-CM | POA: Diagnosis present

## 2017-08-24 DIAGNOSIS — Z880 Allergy status to penicillin: Secondary | ICD-10-CM | POA: Diagnosis not present

## 2017-08-24 DIAGNOSIS — Z96612 Presence of left artificial shoulder joint: Secondary | ICD-10-CM | POA: Diagnosis not present

## 2017-08-24 DIAGNOSIS — Z79899 Other long term (current) drug therapy: Secondary | ICD-10-CM | POA: Diagnosis not present

## 2017-08-24 DIAGNOSIS — E1122 Type 2 diabetes mellitus with diabetic chronic kidney disease: Secondary | ICD-10-CM | POA: Diagnosis present

## 2017-08-24 DIAGNOSIS — M25512 Pain in left shoulder: Secondary | ICD-10-CM | POA: Diagnosis not present

## 2017-08-24 DIAGNOSIS — Z6839 Body mass index (BMI) 39.0-39.9, adult: Secondary | ICD-10-CM | POA: Diagnosis not present

## 2017-08-24 DIAGNOSIS — Z794 Long term (current) use of insulin: Secondary | ICD-10-CM | POA: Diagnosis not present

## 2017-08-24 DIAGNOSIS — G8918 Other acute postprocedural pain: Secondary | ICD-10-CM | POA: Diagnosis not present

## 2017-08-24 DIAGNOSIS — E039 Hypothyroidism, unspecified: Secondary | ICD-10-CM | POA: Diagnosis present

## 2017-08-24 DIAGNOSIS — Z7982 Long term (current) use of aspirin: Secondary | ICD-10-CM | POA: Diagnosis not present

## 2017-08-24 DIAGNOSIS — Z888 Allergy status to other drugs, medicaments and biological substances status: Secondary | ICD-10-CM | POA: Diagnosis not present

## 2017-08-24 DIAGNOSIS — Z881 Allergy status to other antibiotic agents status: Secondary | ICD-10-CM | POA: Diagnosis not present

## 2017-08-24 DIAGNOSIS — M797 Fibromyalgia: Secondary | ICD-10-CM | POA: Diagnosis present

## 2017-08-27 DIAGNOSIS — M797 Fibromyalgia: Secondary | ICD-10-CM | POA: Diagnosis not present

## 2017-08-27 DIAGNOSIS — I129 Hypertensive chronic kidney disease with stage 1 through stage 4 chronic kidney disease, or unspecified chronic kidney disease: Secondary | ICD-10-CM | POA: Diagnosis not present

## 2017-08-27 DIAGNOSIS — E1121 Type 2 diabetes mellitus with diabetic nephropathy: Secondary | ICD-10-CM | POA: Diagnosis not present

## 2017-08-27 DIAGNOSIS — E1122 Type 2 diabetes mellitus with diabetic chronic kidney disease: Secondary | ICD-10-CM | POA: Diagnosis not present

## 2017-08-27 DIAGNOSIS — Z471 Aftercare following joint replacement surgery: Secondary | ICD-10-CM | POA: Diagnosis not present

## 2017-08-27 DIAGNOSIS — N182 Chronic kidney disease, stage 2 (mild): Secondary | ICD-10-CM | POA: Diagnosis not present

## 2017-08-28 DIAGNOSIS — M25461 Effusion, right knee: Secondary | ICD-10-CM | POA: Diagnosis not present

## 2017-08-28 DIAGNOSIS — E1122 Type 2 diabetes mellitus with diabetic chronic kidney disease: Secondary | ICD-10-CM | POA: Diagnosis not present

## 2017-08-28 DIAGNOSIS — M7989 Other specified soft tissue disorders: Secondary | ICD-10-CM | POA: Diagnosis not present

## 2017-08-28 DIAGNOSIS — M79661 Pain in right lower leg: Secondary | ICD-10-CM | POA: Diagnosis not present

## 2017-08-28 DIAGNOSIS — M797 Fibromyalgia: Secondary | ICD-10-CM | POA: Diagnosis not present

## 2017-08-28 DIAGNOSIS — Z471 Aftercare following joint replacement surgery: Secondary | ICD-10-CM | POA: Diagnosis not present

## 2017-08-28 DIAGNOSIS — N182 Chronic kidney disease, stage 2 (mild): Secondary | ICD-10-CM | POA: Diagnosis not present

## 2017-08-28 DIAGNOSIS — E1121 Type 2 diabetes mellitus with diabetic nephropathy: Secondary | ICD-10-CM | POA: Diagnosis not present

## 2017-08-28 DIAGNOSIS — I129 Hypertensive chronic kidney disease with stage 1 through stage 4 chronic kidney disease, or unspecified chronic kidney disease: Secondary | ICD-10-CM | POA: Diagnosis not present

## 2017-08-30 DIAGNOSIS — I129 Hypertensive chronic kidney disease with stage 1 through stage 4 chronic kidney disease, or unspecified chronic kidney disease: Secondary | ICD-10-CM | POA: Diagnosis not present

## 2017-08-30 DIAGNOSIS — M797 Fibromyalgia: Secondary | ICD-10-CM | POA: Diagnosis not present

## 2017-08-30 DIAGNOSIS — N182 Chronic kidney disease, stage 2 (mild): Secondary | ICD-10-CM | POA: Diagnosis not present

## 2017-08-30 DIAGNOSIS — Z471 Aftercare following joint replacement surgery: Secondary | ICD-10-CM | POA: Diagnosis not present

## 2017-08-30 DIAGNOSIS — E1121 Type 2 diabetes mellitus with diabetic nephropathy: Secondary | ICD-10-CM | POA: Diagnosis not present

## 2017-08-30 DIAGNOSIS — E1122 Type 2 diabetes mellitus with diabetic chronic kidney disease: Secondary | ICD-10-CM | POA: Diagnosis not present

## 2017-08-31 DIAGNOSIS — Z471 Aftercare following joint replacement surgery: Secondary | ICD-10-CM | POA: Diagnosis not present

## 2017-08-31 DIAGNOSIS — E1122 Type 2 diabetes mellitus with diabetic chronic kidney disease: Secondary | ICD-10-CM | POA: Diagnosis not present

## 2017-08-31 DIAGNOSIS — I129 Hypertensive chronic kidney disease with stage 1 through stage 4 chronic kidney disease, or unspecified chronic kidney disease: Secondary | ICD-10-CM | POA: Diagnosis not present

## 2017-08-31 DIAGNOSIS — M797 Fibromyalgia: Secondary | ICD-10-CM | POA: Diagnosis not present

## 2017-08-31 DIAGNOSIS — E1121 Type 2 diabetes mellitus with diabetic nephropathy: Secondary | ICD-10-CM | POA: Diagnosis not present

## 2017-08-31 DIAGNOSIS — N182 Chronic kidney disease, stage 2 (mild): Secondary | ICD-10-CM | POA: Diagnosis not present

## 2017-09-01 DIAGNOSIS — E1122 Type 2 diabetes mellitus with diabetic chronic kidney disease: Secondary | ICD-10-CM | POA: Diagnosis not present

## 2017-09-01 DIAGNOSIS — I129 Hypertensive chronic kidney disease with stage 1 through stage 4 chronic kidney disease, or unspecified chronic kidney disease: Secondary | ICD-10-CM | POA: Diagnosis not present

## 2017-09-01 DIAGNOSIS — N182 Chronic kidney disease, stage 2 (mild): Secondary | ICD-10-CM | POA: Diagnosis not present

## 2017-09-01 DIAGNOSIS — Z471 Aftercare following joint replacement surgery: Secondary | ICD-10-CM | POA: Diagnosis not present

## 2017-09-01 DIAGNOSIS — M797 Fibromyalgia: Secondary | ICD-10-CM | POA: Diagnosis not present

## 2017-09-01 DIAGNOSIS — E1121 Type 2 diabetes mellitus with diabetic nephropathy: Secondary | ICD-10-CM | POA: Diagnosis not present

## 2017-09-04 DIAGNOSIS — N182 Chronic kidney disease, stage 2 (mild): Secondary | ICD-10-CM | POA: Diagnosis not present

## 2017-09-04 DIAGNOSIS — M797 Fibromyalgia: Secondary | ICD-10-CM | POA: Diagnosis not present

## 2017-09-04 DIAGNOSIS — E1122 Type 2 diabetes mellitus with diabetic chronic kidney disease: Secondary | ICD-10-CM | POA: Diagnosis not present

## 2017-09-04 DIAGNOSIS — I129 Hypertensive chronic kidney disease with stage 1 through stage 4 chronic kidney disease, or unspecified chronic kidney disease: Secondary | ICD-10-CM | POA: Diagnosis not present

## 2017-09-04 DIAGNOSIS — Z471 Aftercare following joint replacement surgery: Secondary | ICD-10-CM | POA: Diagnosis not present

## 2017-09-04 DIAGNOSIS — E1121 Type 2 diabetes mellitus with diabetic nephropathy: Secondary | ICD-10-CM | POA: Diagnosis not present

## 2017-09-05 DIAGNOSIS — E1122 Type 2 diabetes mellitus with diabetic chronic kidney disease: Secondary | ICD-10-CM | POA: Diagnosis not present

## 2017-09-05 DIAGNOSIS — E1121 Type 2 diabetes mellitus with diabetic nephropathy: Secondary | ICD-10-CM | POA: Diagnosis not present

## 2017-09-05 DIAGNOSIS — Z471 Aftercare following joint replacement surgery: Secondary | ICD-10-CM | POA: Diagnosis not present

## 2017-09-05 DIAGNOSIS — N182 Chronic kidney disease, stage 2 (mild): Secondary | ICD-10-CM | POA: Diagnosis not present

## 2017-09-05 DIAGNOSIS — M797 Fibromyalgia: Secondary | ICD-10-CM | POA: Diagnosis not present

## 2017-09-05 DIAGNOSIS — I129 Hypertensive chronic kidney disease with stage 1 through stage 4 chronic kidney disease, or unspecified chronic kidney disease: Secondary | ICD-10-CM | POA: Diagnosis not present

## 2017-09-06 DIAGNOSIS — M25512 Pain in left shoulder: Secondary | ICD-10-CM | POA: Diagnosis not present

## 2017-09-07 DIAGNOSIS — E1121 Type 2 diabetes mellitus with diabetic nephropathy: Secondary | ICD-10-CM | POA: Diagnosis not present

## 2017-09-07 DIAGNOSIS — E1122 Type 2 diabetes mellitus with diabetic chronic kidney disease: Secondary | ICD-10-CM | POA: Diagnosis not present

## 2017-09-07 DIAGNOSIS — N182 Chronic kidney disease, stage 2 (mild): Secondary | ICD-10-CM | POA: Diagnosis not present

## 2017-09-07 DIAGNOSIS — I129 Hypertensive chronic kidney disease with stage 1 through stage 4 chronic kidney disease, or unspecified chronic kidney disease: Secondary | ICD-10-CM | POA: Diagnosis not present

## 2017-09-07 DIAGNOSIS — Z471 Aftercare following joint replacement surgery: Secondary | ICD-10-CM | POA: Diagnosis not present

## 2017-09-07 DIAGNOSIS — M797 Fibromyalgia: Secondary | ICD-10-CM | POA: Diagnosis not present

## 2017-09-08 DIAGNOSIS — E1122 Type 2 diabetes mellitus with diabetic chronic kidney disease: Secondary | ICD-10-CM | POA: Diagnosis not present

## 2017-09-08 DIAGNOSIS — N182 Chronic kidney disease, stage 2 (mild): Secondary | ICD-10-CM | POA: Diagnosis not present

## 2017-09-08 DIAGNOSIS — I129 Hypertensive chronic kidney disease with stage 1 through stage 4 chronic kidney disease, or unspecified chronic kidney disease: Secondary | ICD-10-CM | POA: Diagnosis not present

## 2017-09-08 DIAGNOSIS — E1121 Type 2 diabetes mellitus with diabetic nephropathy: Secondary | ICD-10-CM | POA: Diagnosis not present

## 2017-09-08 DIAGNOSIS — Z471 Aftercare following joint replacement surgery: Secondary | ICD-10-CM | POA: Diagnosis not present

## 2017-09-08 DIAGNOSIS — M797 Fibromyalgia: Secondary | ICD-10-CM | POA: Diagnosis not present

## 2017-09-11 DIAGNOSIS — N390 Urinary tract infection, site not specified: Secondary | ICD-10-CM | POA: Diagnosis not present

## 2017-09-11 DIAGNOSIS — M797 Fibromyalgia: Secondary | ICD-10-CM | POA: Diagnosis not present

## 2017-09-11 DIAGNOSIS — E1140 Type 2 diabetes mellitus with diabetic neuropathy, unspecified: Secondary | ICD-10-CM | POA: Diagnosis not present

## 2017-09-11 DIAGNOSIS — E1121 Type 2 diabetes mellitus with diabetic nephropathy: Secondary | ICD-10-CM | POA: Diagnosis not present

## 2017-09-11 DIAGNOSIS — Z471 Aftercare following joint replacement surgery: Secondary | ICD-10-CM | POA: Diagnosis not present

## 2017-09-11 DIAGNOSIS — E1122 Type 2 diabetes mellitus with diabetic chronic kidney disease: Secondary | ICD-10-CM | POA: Diagnosis not present

## 2017-09-11 DIAGNOSIS — M79606 Pain in leg, unspecified: Secondary | ICD-10-CM | POA: Diagnosis not present

## 2017-09-11 DIAGNOSIS — I129 Hypertensive chronic kidney disease with stage 1 through stage 4 chronic kidney disease, or unspecified chronic kidney disease: Secondary | ICD-10-CM | POA: Diagnosis not present

## 2017-09-11 DIAGNOSIS — N182 Chronic kidney disease, stage 2 (mild): Secondary | ICD-10-CM | POA: Diagnosis not present

## 2017-09-12 DIAGNOSIS — Z7982 Long term (current) use of aspirin: Secondary | ICD-10-CM | POA: Diagnosis not present

## 2017-09-12 DIAGNOSIS — T462X1A Poisoning by other antidysrhythmic drugs, accidental (unintentional), initial encounter: Secondary | ICD-10-CM | POA: Diagnosis not present

## 2017-09-12 DIAGNOSIS — E1122 Type 2 diabetes mellitus with diabetic chronic kidney disease: Secondary | ICD-10-CM | POA: Diagnosis not present

## 2017-09-12 DIAGNOSIS — T65891A Toxic effect of other specified substances, accidental (unintentional), initial encounter: Secondary | ICD-10-CM | POA: Diagnosis not present

## 2017-09-12 DIAGNOSIS — E039 Hypothyroidism, unspecified: Secondary | ICD-10-CM | POA: Diagnosis not present

## 2017-09-12 DIAGNOSIS — I129 Hypertensive chronic kidney disease with stage 1 through stage 4 chronic kidney disease, or unspecified chronic kidney disease: Secondary | ICD-10-CM | POA: Diagnosis not present

## 2017-09-12 DIAGNOSIS — R55 Syncope and collapse: Secondary | ICD-10-CM | POA: Diagnosis not present

## 2017-09-12 DIAGNOSIS — R8279 Other abnormal findings on microbiological examination of urine: Secondary | ICD-10-CM | POA: Diagnosis not present

## 2017-09-12 DIAGNOSIS — T50901A Poisoning by unspecified drugs, medicaments and biological substances, accidental (unintentional), initial encounter: Secondary | ICD-10-CM | POA: Diagnosis not present

## 2017-09-12 DIAGNOSIS — R001 Bradycardia, unspecified: Secondary | ICD-10-CM | POA: Diagnosis not present

## 2017-09-12 DIAGNOSIS — Z794 Long term (current) use of insulin: Secondary | ICD-10-CM | POA: Diagnosis not present

## 2017-09-12 DIAGNOSIS — N183 Chronic kidney disease, stage 3 (moderate): Secondary | ICD-10-CM | POA: Diagnosis not present

## 2017-09-13 DIAGNOSIS — E1122 Type 2 diabetes mellitus with diabetic chronic kidney disease: Secondary | ICD-10-CM | POA: Diagnosis not present

## 2017-09-13 DIAGNOSIS — Z471 Aftercare following joint replacement surgery: Secondary | ICD-10-CM | POA: Diagnosis not present

## 2017-09-13 DIAGNOSIS — T50901A Poisoning by unspecified drugs, medicaments and biological substances, accidental (unintentional), initial encounter: Secondary | ICD-10-CM | POA: Diagnosis not present

## 2017-09-13 DIAGNOSIS — I129 Hypertensive chronic kidney disease with stage 1 through stage 4 chronic kidney disease, or unspecified chronic kidney disease: Secondary | ICD-10-CM | POA: Diagnosis not present

## 2017-09-13 DIAGNOSIS — E1121 Type 2 diabetes mellitus with diabetic nephropathy: Secondary | ICD-10-CM | POA: Diagnosis not present

## 2017-09-13 DIAGNOSIS — N182 Chronic kidney disease, stage 2 (mild): Secondary | ICD-10-CM | POA: Diagnosis not present

## 2017-09-13 DIAGNOSIS — M797 Fibromyalgia: Secondary | ICD-10-CM | POA: Diagnosis not present

## 2017-09-14 DIAGNOSIS — N182 Chronic kidney disease, stage 2 (mild): Secondary | ICD-10-CM | POA: Diagnosis not present

## 2017-09-14 DIAGNOSIS — E1121 Type 2 diabetes mellitus with diabetic nephropathy: Secondary | ICD-10-CM | POA: Diagnosis not present

## 2017-09-14 DIAGNOSIS — Z471 Aftercare following joint replacement surgery: Secondary | ICD-10-CM | POA: Diagnosis not present

## 2017-09-14 DIAGNOSIS — M797 Fibromyalgia: Secondary | ICD-10-CM | POA: Diagnosis not present

## 2017-09-14 DIAGNOSIS — I129 Hypertensive chronic kidney disease with stage 1 through stage 4 chronic kidney disease, or unspecified chronic kidney disease: Secondary | ICD-10-CM | POA: Diagnosis not present

## 2017-09-14 DIAGNOSIS — E1122 Type 2 diabetes mellitus with diabetic chronic kidney disease: Secondary | ICD-10-CM | POA: Diagnosis not present

## 2017-09-15 DIAGNOSIS — E1122 Type 2 diabetes mellitus with diabetic chronic kidney disease: Secondary | ICD-10-CM | POA: Diagnosis not present

## 2017-09-15 DIAGNOSIS — E1121 Type 2 diabetes mellitus with diabetic nephropathy: Secondary | ICD-10-CM | POA: Diagnosis not present

## 2017-09-15 DIAGNOSIS — M797 Fibromyalgia: Secondary | ICD-10-CM | POA: Diagnosis not present

## 2017-09-15 DIAGNOSIS — I129 Hypertensive chronic kidney disease with stage 1 through stage 4 chronic kidney disease, or unspecified chronic kidney disease: Secondary | ICD-10-CM | POA: Diagnosis not present

## 2017-09-15 DIAGNOSIS — Z471 Aftercare following joint replacement surgery: Secondary | ICD-10-CM | POA: Diagnosis not present

## 2017-09-15 DIAGNOSIS — N182 Chronic kidney disease, stage 2 (mild): Secondary | ICD-10-CM | POA: Diagnosis not present

## 2017-09-18 DIAGNOSIS — M797 Fibromyalgia: Secondary | ICD-10-CM | POA: Diagnosis not present

## 2017-09-18 DIAGNOSIS — E1121 Type 2 diabetes mellitus with diabetic nephropathy: Secondary | ICD-10-CM | POA: Diagnosis not present

## 2017-09-18 DIAGNOSIS — N182 Chronic kidney disease, stage 2 (mild): Secondary | ICD-10-CM | POA: Diagnosis not present

## 2017-09-18 DIAGNOSIS — I129 Hypertensive chronic kidney disease with stage 1 through stage 4 chronic kidney disease, or unspecified chronic kidney disease: Secondary | ICD-10-CM | POA: Diagnosis not present

## 2017-09-18 DIAGNOSIS — E1122 Type 2 diabetes mellitus with diabetic chronic kidney disease: Secondary | ICD-10-CM | POA: Diagnosis not present

## 2017-09-18 DIAGNOSIS — Z471 Aftercare following joint replacement surgery: Secondary | ICD-10-CM | POA: Diagnosis not present

## 2017-09-20 DIAGNOSIS — Z471 Aftercare following joint replacement surgery: Secondary | ICD-10-CM | POA: Diagnosis not present

## 2017-09-20 DIAGNOSIS — E1122 Type 2 diabetes mellitus with diabetic chronic kidney disease: Secondary | ICD-10-CM | POA: Diagnosis not present

## 2017-09-20 DIAGNOSIS — E1121 Type 2 diabetes mellitus with diabetic nephropathy: Secondary | ICD-10-CM | POA: Diagnosis not present

## 2017-09-20 DIAGNOSIS — N182 Chronic kidney disease, stage 2 (mild): Secondary | ICD-10-CM | POA: Diagnosis not present

## 2017-09-20 DIAGNOSIS — I129 Hypertensive chronic kidney disease with stage 1 through stage 4 chronic kidney disease, or unspecified chronic kidney disease: Secondary | ICD-10-CM | POA: Diagnosis not present

## 2017-09-20 DIAGNOSIS — M797 Fibromyalgia: Secondary | ICD-10-CM | POA: Diagnosis not present

## 2017-09-22 DIAGNOSIS — I129 Hypertensive chronic kidney disease with stage 1 through stage 4 chronic kidney disease, or unspecified chronic kidney disease: Secondary | ICD-10-CM | POA: Diagnosis not present

## 2017-09-22 DIAGNOSIS — N182 Chronic kidney disease, stage 2 (mild): Secondary | ICD-10-CM | POA: Diagnosis not present

## 2017-09-22 DIAGNOSIS — E1122 Type 2 diabetes mellitus with diabetic chronic kidney disease: Secondary | ICD-10-CM | POA: Diagnosis not present

## 2017-09-22 DIAGNOSIS — Z471 Aftercare following joint replacement surgery: Secondary | ICD-10-CM | POA: Diagnosis not present

## 2017-09-22 DIAGNOSIS — E1121 Type 2 diabetes mellitus with diabetic nephropathy: Secondary | ICD-10-CM | POA: Diagnosis not present

## 2017-09-22 DIAGNOSIS — M797 Fibromyalgia: Secondary | ICD-10-CM | POA: Diagnosis not present

## 2017-09-25 ENCOUNTER — Ambulatory Visit (INDEPENDENT_AMBULATORY_CARE_PROVIDER_SITE_OTHER): Payer: Medicare Other | Admitting: Physical Therapy

## 2017-09-25 ENCOUNTER — Encounter: Payer: Self-pay | Admitting: Physical Therapy

## 2017-09-25 DIAGNOSIS — M6281 Muscle weakness (generalized): Secondary | ICD-10-CM

## 2017-09-25 DIAGNOSIS — M25512 Pain in left shoulder: Secondary | ICD-10-CM

## 2017-09-25 DIAGNOSIS — M25612 Stiffness of left shoulder, not elsewhere classified: Secondary | ICD-10-CM

## 2017-09-25 DIAGNOSIS — M62838 Other muscle spasm: Secondary | ICD-10-CM | POA: Diagnosis not present

## 2017-09-25 NOTE — Therapy (Signed)
[place], [place], [address] Phone: [phone]   Fax:  [phone]  Physical Therapy Evaluation  Patient Details  Name: Abigail [name] MRN: [id number] Date of Birth: 14-Feb-1941 Referring Provider: Dr Jasper Loser   Encounter Date: 09/25/2017  PT End of Session - 09/25/17 1108    Visit Number  1    Number of Visits  24    Date for PT Re-Evaluation  12/18/17    PT Start Time  1108    PT Stop Time  1212    PT Time Calculation (min)  64 min    Activity Tolerance  Patient tolerated treatment well       Past Medical History:  Diagnosis Date  . Boils    near vaginal area  . Chronic kidney disease    stage 3; low kidney function; BUN was high acc to pt  . Constipation due to pain medication   . Depression   . Diabetes mellitus    Type 2  . Family hx of colon cancer   . Fibromyalgia   . Frequency of urination   . Frequent urination at night   . GERD (gastroesophageal reflux disease)    pt denies  . Heart murmur   . History of bladder infections   . Hyperlipidemia   . Hypertension   . Hypothyroidism   . Itchy eyes   . Obesity   . Obesity   . PONV (postoperative nausea and vomiting)    with stapedectomy  . Torn rotator cuff left   current    Past Surgical History:  Procedure Laterality Date  . BACK SURGERY     lumbar fusion  . CARPAL TUNNEL RELEASE Right 06/29/2017   Procedure: RIGHT CARPAL TUNNEL RELEASE;  Surgeon: Daryll Brod, MD;  Location: [facility];  Service: Orthopedics;  Laterality: Right;  . CHOLECYSTECTOMY  1971  . COLONOSCOPY    . DILATION AND CURETTAGE OF UTERUS    . MAXIMUM ACCESS (MAS)POSTERIOR LUMBAR INTERBODY FUSION (PLIF) 1 LEVEL N/A 11/07/2014   Procedure: Lumbar Four-Five Maximum access posterior lumbar interbody fusion;  Surgeon: Erline Levine, MD;  Location: [facility];  Service: Neurosurgery;  Laterality: N/A;  L4-5 Maximum access posterior  lumbar interbody fusion  . SHOULDER SURGERY     bilateral rotator cuff repairs  . STAPEDECTOMY Left    ear  . TUBAL LIGATION  1971    There were no vitals filed for this visit.   Subjective Assessment - 09/25/17 1108    Subjective  Abigail [name] had an elective Lt reverse shoulder replacement 08/24/17. She had HHPT  and has been performing her pendulums, passive flex stetch on counter, gentle wall climbs, elbow ROM. Sling off if she is sitting resting.  Pt reports that since she has had her surgery her knee has really started to hurt and she hasnt had her follow up with her CTR Rt hand.     Pertinent History  06/28/17 Rt carpal tunnel release.     Patient Stated Goals  brush her hair and fix it.     Currently in Pain?  Yes    Pain Score  1  up to 3/10 with her HEP    Pain Location  Shoulder    Pain Orientation  Left    Pain Descriptors / Indicators  Aching;Dull    Pain Type  Surgical pain    Pain Onset  More than a month ago    Pain  Frequency  Intermittent    Aggravating Factors   moving around    Pain Relieving Factors  rest and ice         [facility] PT Assessment - 09/25/17 0001      Assessment   Medical Diagnosis  Lt reverse shoulder    Referring Provider  Dr Jasper Loser    Onset Date/Surgical Date  08/24/17    Hand Dominance  Right    Next MD Visit  10/06/17    Prior Therapy  HHPT for shoulder, lots for other body parts.       Precautions   Precautions  Shoulder    Type of Shoulder Precautions  for reverse replacement    Shoulder Interventions  Shoulder sling/immobilizer      Restrictions   Weight Bearing Restrictions  -- no WBing through Lt UE      Balance Screen   Has the patient fallen in the past 6 months  Yes    How many times?  2 fell on back door, Rt knee gave out, and in front of bank    Has the patient had a decrease in activity level because of a fear of falling?   Yes    Is the patient reluctant to leave their home because of a fear of falling?   No      Prior  Function   Level of Independence  -- I with bathing, some assist with dressing.     Vocation  Retired      Observation/Other Assessments   Focus on Therapeutic Outcomes (FOTO)   74% limited      Posture/Postural Control   Posture/Postural Control  Postural limitations    Postural Limitations  Rounded Shoulders;Forward head;Increased thoracic kyphosis;Decreased lumbar lordosis obesity      ROM / Strength   AROM / PROM / Strength  AROM;PROM      AROM   AROM Assessment Site  Elbow;Forearm;Wrist    Right/Left Elbow  -- WNL    Right/Left Forearm  -- WNL    Right/Left Wrist  -- WNL      PROM   PROM Assessment Site  Shoulder    Right/Left Shoulder  Left    Left Shoulder Flexion  120 Degrees    Left Shoulder Internal Rotation  70 Degrees    Left Shoulder External Rotation  22 Degrees      Palpation   Palpation comment  tight and tender in Lt bicep, deltoid, upper shoulder, hypomobile through incision anterior shoulder - steri strips still intact.                 Objective measurements completed on examination: See above findings.      [facility] Adult PT Treatment/Exercise - 09/25/17 0001      Exercises   Exercises  Shoulder      Shoulder Exercises: Seated   Elevation  Both;10 reps    Retraction  Both;10 reps      Modalities   Modalities  Electrical Stimulation;Cryotherapy;Moist Heat;Ultrasound      Moist Heat Therapy   Number Minutes Moist Heat  15 Minutes    Moist Heat Location  Knee Rt for comfort      Cryotherapy   Number Minutes Cryotherapy  15 Minutes    Cryotherapy Location  Shoulder Lt in supine    Type of Cryotherapy  Ice pack      Electrical Stimulation   Electrical Stimulation Location  Lt shoulder and upper arm    Electrical Stimulation  Action  IFC    Electrical Stimulation Parameters  to tolerance    Electrical Stimulation Goals  Pain;Tone;Edema      Ultrasound   Ultrasound Location  Lt lateral bicep    Ultrasound Parameters  100%, 1.5 w/cm2,  1.19mHz    Ultrasound Goals  Pain tone      Manual Therapy   Manual Therapy  Passive ROM;Soft tissue mobilization    Soft tissue mobilization  STM to Lt bicep, deltoid and upper trap in supine    Passive ROM  Lt shoulder flexion, IR, ER to 25 degrees.              PT Education - 09/25/17 1220    Education provided  Yes    Education Details  HEP    Person(s) Educated  Patient    Methods  Explanation;Demonstration;Handout    Comprehension  Returned demonstration;Verbalized understanding       PT Short Term Goals - 09/25/17 1238      PT SHORT TERM GOAL #1   Title  I with initial HEP for shoulder per protocol ( 11/06/17)     Time  6    Period  Weeks    Status  New      PT SHORT TERM GOAL #2   Title  demo Lt shoulder PROM WFL in supine position with minimal pain - per protocol ( 11/06/17)     Time  6    Period  Weeks    Status  New      PT SHORT TERM GOAL #3   Title  tolerate initial scapular stability ex without difficulty ( 11/06/17)     Time  6    Period  Weeks    Status  New      PT SHORT TERM GOAL #4   Title  report no more than 2/10 pain from muscle spasms in the Lt shoulder/neck ( 11/06/17)     Time  6    Period  Weeks    Status  New      PT SHORT TERM GOAL #5   Title  improve FOTO =/< 65% limited ( 11/06/17)     Time  6    Period  Weeks    Status  New        PT Long Term Goals - 09/25/17 1241      PT LONG TERM GOAL #1   Title  I with advanced HEP for shoulder ( 12/18/17)     Time  12    Period  Weeks    Status  New      PT LONG TERM GOAL #2   Title  perform bathing/dressing I with minimal discomfort ( 12/18/17)     Time  12    Period  Weeks    Status  New      PT LONG TERM GOAL #3   Title  demo Lt shoulder AROM WFL to allow her to reach a shelf overhead with good mechanics ( 12/18/17)     Time  12    Period  Weeks    Status  New      PT LONG TERM GOAL #4   Title  demo Lt shoulder strength =/> 4+/5 to assist with daily housework ( 12/18/17)      Time  12    Period  Weeks    Status  New      PT LONG TERM GOAL #5   Title  Improve FOTO =/< 48%  limited ( 12/18/17)     Time  12    Period  Weeks    Status  New             Plan - 09/25/17 1229    Clinical Impression Statement  Abigail [name] is ~ 4.5 wks s/p reverese Lt shoulder replacement.  She has been working with [facility].  She is beginning to develop some pain/tightness in the Lt bicep/deltoid and upper shoulder/neck.  There is swelling still visiible around the joint and her incision is healing.  Abigail [name] has full ROM of her elbow/forearm/wrist and hand.  Shoulder is PROM only at this time.  We will follow the protocol that patient brought in.  She had been performing wall climbs at home and it was recommended to her to stop those for now.     History and Personal Factors relevant to plan of care:  multiple orthopedic surgeries.     Clinical Presentation  Evolving    Clinical Decision Making  Moderate    Rehab Potential  Good    PT Frequency  2x / week    PT Duration  12 weeks    PT Treatment/Interventions  Dry needling;Manual techniques;Moist Heat;Ultrasound;Therapeutic activities;Patient/family education;Taping;Vasopneumatic Device;Therapeutic exercise;Cryotherapy;Electrical Stimulation;Passive range of motion;Scar mobilization    PT Next Visit Plan  shoulder isometrics - per protocol, Lt shoulder PROM    Consulted and Agree with Plan of Care  Patient       Patient will benefit from skilled therapeutic intervention in order to improve the following deficits and impairments:  Pain, Improper body mechanics, Postural dysfunction, Increased muscle spasms, Decreased scar mobility, Decreased range of motion, Decreased strength, Impaired UE functional use, Obesity, Increased edema  Visit Diagnosis: Stiffness of left shoulder, not elsewhere classified - Plan: PT plan of care cert/re-cert  Acute pain of left shoulder - Plan: PT plan of care cert/re-cert  Muscle weakness (generalized) -  Plan: PT plan of care cert/re-cert  Other muscle spasm - Plan: PT plan of care cert/re-cert     Problem List Patient Active Problem List   Diagnosis Date Noted  . Furuncle of vulva 12/06/2016  . Bilateral carpal tunnel syndrome 11/23/2016  . Neck pain 11/23/2016  . Primary osteoarthritis of both first carpometacarpal joints 11/23/2016  . Diabetic nephropathy ([place]) 08/18/2016  . Right knee pain 01/03/2016  . Asymmetrical hearing loss of both ears 02/27/2015  . Pressure ulcer 11/10/2014  . Spondylolisthesis of lumbar region 11/07/2014  . CKD (chronic kidney disease), stage III ([place]) 07/24/2013  . Chest pain 07/23/2013  . Chronic pain 07/23/2013  . Fibromyalgia 07/23/2013  . Leukocytosis 02/07/2013  . Weakness generalized 02/07/2013  . Acute encephalopathy 02/05/2013  . Anemia 02/05/2013  . Acute renal failure ([facility]) 02/05/2013  . Dehydration 09/12/2012  . Diabetes mellitus ([place]) 09/12/2012  . Hypertension 09/12/2012  . Hypothyroidism 09/12/2012  . Hyperlipidemia 09/12/2012    Jeral Pinch PT  09/25/2017, 12:48 PM  [facility] [place], [place], [address] Phone: [phone]   Fax:  [phone]  Name: Abigail [name] MRN: [id number] Date of Birth: 10-07-1940

## 2017-09-25 NOTE — Patient Instructions (Addendum)
Shoulder Shrug / Circle    Bring shoulders up toward ears and back down. Then circle shoulders backward. Repeat _10___ times. Do __4-5__ sessions per day.  Shoulder (Scapula) Retraction - keep arms at sides.     Pull shoulders back, squeezing shoulder blades together. Repeat _10___ times per session. Do __4-5  Times a day. Marland Kitchen Position: Standing   Quad Set    With other leg bent, foot flat, slowly tighten muscles on thigh of straight leg while counting out loud to ___10_. Repeat with other leg. Repeat __10_ times. Do __2__ sessions per day.  Knee Extension (Active / Resistive ROM)    Extend knee so that lower leg is straight. Hold ____ seconds while counting out loud. Repeat with other leg. Repeat ____ times. Do ____ sessions per day.  http://gt2.exer.us/618   Copyright  VHI. All rights reserved.

## 2017-09-26 DIAGNOSIS — M25461 Effusion, right knee: Secondary | ICD-10-CM | POA: Diagnosis not present

## 2017-09-26 DIAGNOSIS — M79604 Pain in right leg: Secondary | ICD-10-CM | POA: Diagnosis not present

## 2017-09-26 DIAGNOSIS — R600 Localized edema: Secondary | ICD-10-CM | POA: Diagnosis not present

## 2017-09-29 ENCOUNTER — Ambulatory Visit (INDEPENDENT_AMBULATORY_CARE_PROVIDER_SITE_OTHER): Payer: Medicare Other | Admitting: Physical Therapy

## 2017-09-29 DIAGNOSIS — M62838 Other muscle spasm: Secondary | ICD-10-CM

## 2017-09-29 DIAGNOSIS — M6281 Muscle weakness (generalized): Secondary | ICD-10-CM | POA: Diagnosis not present

## 2017-09-29 DIAGNOSIS — M25512 Pain in left shoulder: Secondary | ICD-10-CM

## 2017-09-29 DIAGNOSIS — M25612 Stiffness of left shoulder, not elsewhere classified: Secondary | ICD-10-CM

## 2017-09-29 NOTE — Therapy (Signed)
[place], [place], [address] Phone: [phone]   Fax:  [phone]  Physical Therapy Treatment  Patient Details  Name: Abigail [name] MRN: [id number] Date of Birth: 08-Jul-1940 Referring Provider: Dr. Jasper Loser   Encounter Date: 09/29/2017  PT End of Session - 09/29/17 1158    Visit Number  2    Number of Visits  24    Date for PT Re-Evaluation  12/18/17    PT Start Time  5638 pt arrived without appt.     PT Stop Time  1243    PT Time Calculation (min)  48 min    Activity Tolerance  Patient tolerated treatment well    Behavior During Therapy  WFL for tasks assessed/performed       Past Medical History:  Diagnosis Date  . Boils    near vaginal area  . Chronic kidney disease    stage 3; low kidney function; BUN was high acc to pt  . Constipation due to pain medication   . Depression   . Diabetes mellitus    Type 2  . Family hx of colon cancer   . Fibromyalgia   . Frequency of urination   . Frequent urination at night   . GERD (gastroesophageal reflux disease)    pt denies  . Heart murmur   . History of bladder infections   . Hyperlipidemia   . Hypertension   . Hypothyroidism   . Itchy eyes   . Obesity   . Obesity   . PONV (postoperative nausea and vomiting)    with stapedectomy  . Torn rotator cuff left   current    Past Surgical History:  Procedure Laterality Date  . BACK SURGERY     lumbar fusion  . CARPAL TUNNEL RELEASE Right 06/29/2017   Procedure: RIGHT CARPAL TUNNEL RELEASE;  Surgeon: Daryll Brod, MD;  Location: [facility];  Service: Orthopedics;  Laterality: Right;  . CHOLECYSTECTOMY  1971  . COLONOSCOPY    . DILATION AND CURETTAGE OF UTERUS    . MAXIMUM ACCESS (MAS)POSTERIOR LUMBAR INTERBODY FUSION (PLIF) 1 LEVEL N/A 11/07/2014   Procedure: Lumbar Four-Five Maximum access posterior lumbar interbody fusion;  Surgeon: Erline Levine, MD;  Location: [facility];  Service: Neurosurgery;  Laterality: N/A;  L4-5 Maximum access posterior lumbar interbody fusion  . SHOULDER SURGERY     bilateral rotator cuff repairs  . STAPEDECTOMY Left    ear  . TUBAL LIGATION  1971    There were no vitals filed for this visit.  Subjective Assessment - 09/29/17 1214    Subjective  Abigail [name] reports she has been doing  her exercises.  Her knees are bothersome today.  She took Tramadol and 2 tylenol prior to therapy.  She's not icing her Lt shoulder as much as she used to.     Patient Stated Goals  brush her hair and fix it.     Currently in Pain?  Yes    Pain Score  1     Pain Location  Shoulder    Pain Orientation  Left    Pain Descriptors / Indicators  Sore    Aggravating Factors   moving around    Pain Relieving Factors  rest, ice    Multiple Pain Sites  Yes    Pain Score  4    Pain Location  Knee    Pain Orientation  Right    Pain Descriptors /  Indicators  Dull;Aching    Aggravating Factors   walking, stairs, bending    Pain Relieving Factors  medicine, heat         [facility] PT Assessment - 09/29/17 0001      Assessment   Medical Diagnosis  Lt reverse shoulder    Referring Provider  Dr. Jasper Loser    Onset Date/Surgical Date  08/24/17    Hand Dominance  Right    Next MD Visit  10/06/17       [facility] Adult PT Treatment/Exercise - 09/29/17 0001      Shoulder Exercises: Seated   Retraction  Strengthening;Right;10 reps 5 sec hold    Other Seated Exercises  shoulder shrugs and circles x 10 each.     Other Seated Exercises  Lt hand stress ball squeeze x 10, then Lt bicep curls holding stress ball       Shoulder Exercises: Standing   Other Standing Exercises  LUE pendulum x 10       Shoulder Exercises: Stretch   Table Stretch - Flexion  8 reps;10 seconds VC for Lt arm to remain passive      Modalities   Modalities  Electrical Stimulation;Cryotherapy      Moist Heat Therapy   Number Minutes Moist Heat  15 Minutes    Moist Heat Location   Knee Rt, and cervical for comfort      Cryotherapy   Number Minutes Cryotherapy  15 Minutes    Cryotherapy Location  Shoulder Lt in supine    Type of Cryotherapy  Ice pack      Electrical Stimulation   Electrical Stimulation Location  Lt shoulder    Electrical Stimulation Action  IFC    Electrical Stimulation Parameters   to tolerance    Electrical Stimulation Goals  Pain      Manual Therapy   Manual Therapy  Passive ROM    Passive ROM  Lt shoulder flexion, IR to belly, ER in scapular plane to 25 degrees, elbow ext.                PT Short Term Goals - 09/25/17 1238      PT SHORT TERM GOAL #1   Title  I with initial HEP for shoulder per protocol ( 11/06/17)     Time  6    Period  Weeks    Status  New      PT SHORT TERM GOAL #2   Title  demo Lt shoulder PROM WFL in supine position with minimal pain - per protocol ( 11/06/17)     Time  6    Period  Weeks    Status  New      PT SHORT TERM GOAL #3   Title  tolerate initial scapular stability ex without difficulty ( 11/06/17)     Time  6    Period  Weeks    Status  New      PT SHORT TERM GOAL #4   Title  report no more than 2/10 pain from muscle spasms in the Lt shoulder/neck ( 11/06/17)     Time  6    Period  Weeks    Status  New      PT SHORT TERM GOAL #5   Title  improve FOTO =/< 65% limited ( 11/06/17)     Time  6    Period  Weeks    Status  New        PT Long Term Goals -  09/25/17 1241      PT LONG TERM GOAL #1   Title  I with advanced HEP for shoulder ( 12/18/17)     Time  12    Period  Weeks    Status  New      PT LONG TERM GOAL #2   Title  perform bathing/dressing I with minimal discomfort ( 12/18/17)     Time  12    Period  Weeks    Status  New      PT LONG TERM GOAL #3   Title  demo Lt shoulder AROM WFL to allow her to reach a shelf overhead with good mechanics ( 12/18/17)     Time  12    Period  Weeks    Status  New      PT LONG TERM GOAL #4   Title  demo Lt shoulder strength =/> 4+/5  to assist with daily housework ( 12/18/17)     Time  12    Period  Weeks    Status  New      PT LONG TERM GOAL #5   Title  Improve FOTO =/< 48% limited ( 12/18/17)     Time  12    Period  Weeks    Status  New            Plan - 09/29/17 1231    Clinical Impression Statement  Pt was very guarded with PROM.  Reviewed shoulder rehab protocol with her, emphasizing no AROM until after 6 wks s/p.  She reported slight decrease in pain at end of session.     Rehab Potential  Good    PT Frequency  2x / week    PT Duration  12 weeks    PT Treatment/Interventions  Dry needling;Manual techniques;Moist Heat;Ultrasound;Therapeutic activities;Patient/family education;Taping;Vasopneumatic Device;Therapeutic exercise;Cryotherapy;Electrical Stimulation;Passive range of motion;Scar mobilization    PT Next Visit Plan  Add shoulder isometrics to HEP.  continue Lt shoulder rehab per protocol.         Patient will benefit from skilled therapeutic intervention in order to improve the following deficits and impairments:  Pain, Improper body mechanics, Postural dysfunction, Increased muscle spasms, Decreased scar mobility, Decreased range of motion, Decreased strength, Impaired UE functional use, Obesity, Increased edema  Visit Diagnosis: Stiffness of left shoulder, not elsewhere classified  Acute pain of left shoulder  Muscle weakness (generalized)  Other muscle spasm     Problem List Patient Active Problem List   Diagnosis Date Noted  . Furuncle of vulva 12/06/2016  . Bilateral carpal tunnel syndrome 11/23/2016  . Neck pain 11/23/2016  . Primary osteoarthritis of both first carpometacarpal joints 11/23/2016  . Diabetic nephropathy ([place]) 08/18/2016  . Right knee pain 01/03/2016  . Asymmetrical hearing loss of both ears 02/27/2015  . Pressure ulcer 11/10/2014  . Spondylolisthesis of lumbar region 11/07/2014  . CKD (chronic kidney disease), stage III ([place]) 07/24/2013  . Chest pain 07/23/2013   . Chronic pain 07/23/2013  . Fibromyalgia 07/23/2013  . Leukocytosis 02/07/2013  . Weakness generalized 02/07/2013  . Acute encephalopathy 02/05/2013  . Anemia 02/05/2013  . Acute renal failure ([place]) 02/05/2013  . Dehydration 09/12/2012  . Diabetes mellitus ([place]) 09/12/2012  . Hypertension 09/12/2012  . Hypothyroidism 09/12/2012  . Hyperlipidemia 09/12/2012   Kerin Perna, PTA 09/29/17 12:49 PM  [place], [place], [address] Phone: [phone]   Fax:  [phone]  Name: Abigail [name] MRN: [id number] Date  of Birth: [birth year]

## 2017-10-02 DIAGNOSIS — M1711 Unilateral primary osteoarthritis, right knee: Secondary | ICD-10-CM | POA: Diagnosis not present

## 2017-10-02 DIAGNOSIS — M25561 Pain in right knee: Secondary | ICD-10-CM | POA: Diagnosis not present

## 2017-10-04 DIAGNOSIS — R222 Localized swelling, mass and lump, trunk: Secondary | ICD-10-CM | POA: Diagnosis not present

## 2017-10-04 DIAGNOSIS — Z96612 Presence of left artificial shoulder joint: Secondary | ICD-10-CM | POA: Diagnosis not present

## 2017-10-04 DIAGNOSIS — R2241 Localized swelling, mass and lump, right lower limb: Secondary | ICD-10-CM | POA: Diagnosis not present

## 2017-10-04 DIAGNOSIS — M1711 Unilateral primary osteoarthritis, right knee: Secondary | ICD-10-CM | POA: Diagnosis not present

## 2017-10-04 DIAGNOSIS — M25561 Pain in right knee: Secondary | ICD-10-CM | POA: Diagnosis not present

## 2017-10-05 ENCOUNTER — Ambulatory Visit (INDEPENDENT_AMBULATORY_CARE_PROVIDER_SITE_OTHER): Payer: Medicare Other | Admitting: Physical Therapy

## 2017-10-05 DIAGNOSIS — M25612 Stiffness of left shoulder, not elsewhere classified: Secondary | ICD-10-CM | POA: Diagnosis present

## 2017-10-05 DIAGNOSIS — M25512 Pain in left shoulder: Secondary | ICD-10-CM

## 2017-10-05 DIAGNOSIS — M6281 Muscle weakness (generalized): Secondary | ICD-10-CM

## 2017-10-05 NOTE — Therapy (Signed)
[place], [place], [address] Phone: [phone]   Fax:  [phone]  Physical Therapy Treatment  Patient Details  Name: Abigail [name] MRN: [id number] Date of Birth: [birth year] Referring Provider: Dr. Jasper Loser   Encounter Date: 10/05/2017  PT End of Session - 10/05/17 1155    Visit Number  3    Number of Visits  24    Date for PT Re-Evaluation  12/18/17    PT Start Time  4801    PT Stop Time  1240    PT Time Calculation (min)  51 min    Activity Tolerance  Patient tolerated treatment well;No increased pain    Behavior During Therapy  WFL for tasks assessed/performed       Past Medical History:  Diagnosis Date  . Boils    near vaginal area  . Chronic kidney disease    stage 3; low kidney function; BUN was high acc to pt  . Constipation due to pain medication   . Depression   . Diabetes mellitus    Type 2  . Family hx of colon cancer   . Fibromyalgia   . Frequency of urination   . Frequent urination at night   . GERD (gastroesophageal reflux disease)    pt denies  . Heart murmur   . History of bladder infections   . Hyperlipidemia   . Hypertension   . Hypothyroidism   . Itchy eyes   . Obesity   . Obesity   . PONV (postoperative nausea and vomiting)    with stapedectomy  . Torn rotator cuff left   current    Past Surgical History:  Procedure Laterality Date  . BACK SURGERY     lumbar fusion  . CARPAL TUNNEL RELEASE Right 06/29/2017   Procedure: RIGHT CARPAL TUNNEL RELEASE;  Surgeon: Daryll Brod, MD;  Location: [facility];  Service: Orthopedics;  Laterality: Right;  . CHOLECYSTECTOMY  1971  . COLONOSCOPY    . DILATION AND CURETTAGE OF UTERUS    . MAXIMUM ACCESS (MAS)POSTERIOR LUMBAR INTERBODY FUSION (PLIF) 1 LEVEL N/A 11/07/2014   Procedure: Lumbar Four-Five Maximum access posterior lumbar interbody fusion;  Surgeon: Erline Levine, MD;  Location: [facility];  Service: Neurosurgery;  Laterality: N/A;  L4-5 Maximum access posterior lumbar interbody fusion  . SHOULDER SURGERY     bilateral rotator cuff repairs  . STAPEDECTOMY Left    ear  . TUBAL LIGATION  1971    There were no vitals filed for this visit.  Subjective Assessment - 10/05/17 1153    Subjective  Pt reports she is only wearing sling when driving, sleeping, or out in crowded public areas.  She hasn't done much exercise since her Rt knee has been bothering her.  She is anxious to begin to do more with her Lt arm.     Patient Stated Goals  brush her hair and fix it.     Currently in Pain?  Yes    Pain Score  1     Pain Location  Shoulder    Pain Orientation  Left    Pain Descriptors / Indicators  Sore    Aggravating Factors   "doing something I'm not supposed to"    Pain Relieving Factors  rest, ice    Pain Score  3    Pain Location  Knee    Pain Orientation  Right    Pain Descriptors / Indicators  Aching    Aggravating Factors   walking, stairs, bending    Pain Relieving Factors  medicine, heat         [facility] PT Assessment - 10/05/17 0001      Assessment   Medical Diagnosis  Lt reverse shoulder    Referring Provider  Dr. Jasper Loser    Onset Date/Surgical Date  08/24/17    Hand Dominance  Right    Next MD Visit  10/06/17      PROM   Right/Left Shoulder  Left    Left Shoulder Flexion  130 Degrees    Left Shoulder Internal Rotation  50 Degrees scapular plane    Left Shoulder External Rotation  30 Degrees scapular plane       [facility] Adult PT Treatment/Exercise - 10/05/17 0001      Shoulder Exercises: Supine   External Rotation  AAROM;Left;10 reps cane, scapular plane, shoulder supported in neutral     Internal Rotation  AAROM;Left;12 reps cane, scapular plane, shoulder supported in neutral     Flexion  AAROM;Left;20 reps cane      Shoulder Exercises: Seated   Other Seated Exercises  shoulder shrugs and circles x 10 each.       Shoulder Exercises: Standing    Other Standing Exercises  LUE pendulum x 10, 2 sets.       Other Standing Exercises  Lt shoulder flexion AAROM wall ladder to tolerance x 3 reps; VC to keep shoulder from elevating       Shoulder Exercises: Isometric Strengthening   External Rotation  -- 5 sec, 10 reps    Internal Rotation  -- 5 sec, 10 reps      Cryotherapy   Number Minutes Cryotherapy  15 Minutes    Cryotherapy Location  Shoulder Lt in supine    Type of Cryotherapy  Ice pack      Electrical Stimulation   Electrical Stimulation Location  Lt shoulder  (ant/post deltoid)    Electrical Stimulation Action  premod    Electrical Stimulation Parameters  to tolerance    Electrical Stimulation Goals  Pain      Manual Therapy   Soft tissue mobilization  STM to Lt bicep, deltoid and upper trap in supine    Passive ROM  Lt shoulder IR to 50 deg, ER and scaption to soft tissue limits and no pain.  Lt elbow into full extension.           PT Education - 10/05/17 1234    Education provided  Yes    Education Details  HEP    Person(s) Educated  Patient    Methods  Explanation;Demonstration;Handout    Comprehension  Verbalized understanding;Returned demonstration       PT Short Term Goals - 10/05/17 1255      PT SHORT TERM GOAL #1   Title  I with initial HEP for shoulder per protocol ( 11/06/17)     Time  6    Period  Weeks    Status  Achieved      PT SHORT TERM GOAL #2   Title  demo Lt shoulder PROM WFL in supine position with minimal pain - per protocol ( 11/06/17)     Time  6    Period  Weeks    Status  On-going      PT SHORT TERM GOAL #3   Title  tolerate initial scapular stability ex without difficulty ( 11/06/17)     Time  6  Period  Weeks    Status  Achieved      PT SHORT TERM GOAL #4   Title  report no more than 2/10 pain from muscle spasms in the Lt shoulder/neck ( 11/06/17)     Time  6    Period  Weeks    Status  Achieved      PT SHORT TERM GOAL #5   Title  improve FOTO =/< 65% limited (  11/06/17)     Time  6    Period  Weeks    Status  On-going        PT Long Term Goals - 10/05/17 1301      PT LONG TERM GOAL #1   Title  I with advanced HEP for shoulder ( 12/18/17)     Time  12    Period  Weeks    Status  On-going      PT LONG TERM GOAL #2   Title  perform bathing/dressing I with minimal discomfort ( 12/18/17)     Time  12    Period  Weeks    Status  On-going      PT LONG TERM GOAL #3   Title  demo Lt shoulder AROM WFL to allow her to reach a shelf overhead with good mechanics ( 12/18/17)     Time  12    Period  Weeks    Status  On-going      PT LONG TERM GOAL #4   Title  demo Lt shoulder strength =/> 4+/5 to assist with daily housework ( 12/18/17)     Time  12    Period  Weeks    Status  On-going      PT LONG TERM GOAL #5   Title  Improve FOTO =/< 48% limited ( 12/18/17)     Time  12    Period  Weeks    Status  On-going            Plan - 10/05/17 1158    Clinical Impression Statement  Pt was anxious to return to wall walking and more progressive exercises.  She is finishing phase 1 of rehab protocol; reviewed protocol precautions and exercises she is allowed to do.  Pt was less guarded with PROM today.  ROM progressing well within protocol.   Pt has met STG 1, 3 and 4.    Rehab Potential  Good    PT Frequency  2x / week    PT Duration  12 weeks    PT Treatment/Interventions  Dry needling;Manual techniques;Moist Heat;Ultrasound;Therapeutic activities;Patient/family education;Taping;Vasopneumatic Device;Therapeutic exercise;Cryotherapy;Electrical Stimulation;Passive range of motion;Scar mobilization    PT Next Visit Plan  continue Lt shoulder rehab per protocol.      Consulted and Agree with Plan of Care  Patient       Patient will benefit from skilled therapeutic intervention in order to improve the following deficits and impairments:  Pain, Improper body mechanics, Postural dysfunction, Increased muscle spasms, Decreased scar mobility, Decreased  range of motion, Decreased strength, Impaired UE functional use, Obesity, Increased edema  Visit Diagnosis: Stiffness of left shoulder, not elsewhere classified  Acute pain of left shoulder  Muscle weakness (generalized)     Problem List Patient Active Problem List   Diagnosis Date Noted  . Furuncle of vulva 12/06/2016  . Bilateral carpal tunnel syndrome 11/23/2016  . Neck pain 11/23/2016  . Primary osteoarthritis of both first carpometacarpal joints 11/23/2016  . Diabetic nephropathy ([place]) 08/18/2016  . Right  knee pain 01/03/2016  . Asymmetrical hearing loss of both ears 02/27/2015  . Pressure ulcer 11/10/2014  . Spondylolisthesis of lumbar region 11/07/2014  . CKD (chronic kidney disease), stage III ([place]) 07/24/2013  . Chest pain 07/23/2013  . Chronic pain 07/23/2013  . Fibromyalgia 07/23/2013  . Leukocytosis 02/07/2013  . Weakness generalized 02/07/2013  . Acute encephalopathy 02/05/2013  . Anemia 02/05/2013  . Acute renal failure ([place]) 02/05/2013  . Dehydration 09/12/2012  . Diabetes mellitus ([place]) 09/12/2012  . Hypertension 09/12/2012  . Hypothyroidism 09/12/2012  . Hyperlipidemia 09/12/2012   Kerin Perna, PTA 10/05/17 1:04 PM  [place], [place], [address] Phone: [phone]   Fax:  [phone]  Name: Abigail [name] MRN: [id number] Date of Birth: 08-13-1940

## 2017-10-05 NOTE — Patient Instructions (Signed)
Cane Exercise: Flexion    Lie on back, holding cane above chest. Keeping arms as straight as possible, lower cane toward floor beyond head.  Repeat _10-15___ times.  SHOULDER: External Rotation - Supine (Cane)    Place towel or pillow underneath Left shoulder.  Hold cane with both hands. Rotate arm away from body. Keep elbow close to body. Only move arm until a gentle stretch is felt in shoulder. __10_ reps per set,  Internal Rotation (Isometric)  Place palm of right fist against door frame, with elbow bent. Press fist against door frame. Hold __5__ seconds. Repeat __10__ times. Do _2___ sessions per day.  External Rotation (Isometric)  Place back of left fist against door frame, with elbow bent. Press fist against door frame. Hold _5__ seconds. Repeat _10___ times. Do _2___ sessions per day.  [facility] at [facility] [place], [place] [address]  [phone] (office) [phone] (fax)

## 2017-10-09 ENCOUNTER — Ambulatory Visit (INDEPENDENT_AMBULATORY_CARE_PROVIDER_SITE_OTHER): Payer: Medicare Other | Admitting: Physical Therapy

## 2017-10-09 DIAGNOSIS — M25512 Pain in left shoulder: Secondary | ICD-10-CM

## 2017-10-09 DIAGNOSIS — M6281 Muscle weakness (generalized): Secondary | ICD-10-CM

## 2017-10-09 DIAGNOSIS — M25612 Stiffness of left shoulder, not elsewhere classified: Secondary | ICD-10-CM | POA: Diagnosis present

## 2017-10-09 NOTE — Patient Instructions (Signed)
Strengthening: Isometric Flexion  Using wall for resistance, press right fist into ball using light pressure. Hold __5__ seconds. Repeat __5-10__ times per set. Do _1___ sets per session.   DON'T LEAN INTO WALL.   SHOULDER: Abduction (Isometric)  Use wall as resistance. Press arm against pillow. Keep elbow straight. Hold _5__ seconds. 5-10___ reps per set, _1__ sets per day,  Extension (Isometric)  Place left bent elbow and back of arm against wall. Press elbow against wall. Hold __5__ seconds. Repeat __10__ times. Do __1__ sessions per day.

## 2017-10-09 NOTE — Therapy (Signed)
[place], [place], [address] Phone: [phone]   Fax:  [phone]  Physical Therapy Treatment  Patient Details  Name: Abigail [name] MRN: [id number] Date of Birth: 08-21-1940 Referring Provider: Dr., Jasper Loser   Encounter Date: 10/09/2017  PT End of Session - 10/09/17 1150    Visit Number  4    Number of Visits  24    Date for PT Re-Evaluation  12/18/17    PT Start Time  1147    PT Stop Time  1240    PT Time Calculation (min)  53 min    Activity Tolerance  Patient tolerated treatment well    Behavior During Therapy  [facility] for tasks assessed/performed       Past Medical History:  Diagnosis Date  . Boils    near vaginal area  . Chronic kidney disease    stage 3; low kidney function; BUN was high acc to pt  . Constipation due to pain medication   . Depression   . Diabetes mellitus    Type 2  . Family hx of colon cancer   . Fibromyalgia   . Frequency of urination   . Frequent urination at night   . GERD (gastroesophageal reflux disease)    pt denies  . Heart murmur   . History of bladder infections   . Hyperlipidemia   . Hypertension   . Hypothyroidism   . Itchy eyes   . Obesity   . Obesity   . PONV (postoperative nausea and vomiting)    with stapedectomy  . Torn rotator cuff left   current    Past Surgical History:  Procedure Laterality Date  . BACK SURGERY     lumbar fusion  . CARPAL TUNNEL RELEASE Right 06/29/2017   Procedure: RIGHT CARPAL TUNNEL RELEASE;  Surgeon: Daryll Brod, MD;  Location: [facility];  Service: Orthopedics;  Laterality: Right;  . CHOLECYSTECTOMY  1971  . COLONOSCOPY    . DILATION AND CURETTAGE OF UTERUS    . MAXIMUM ACCESS (MAS)POSTERIOR LUMBAR INTERBODY FUSION (PLIF) 1 LEVEL N/A 11/07/2014   Procedure: Lumbar Four-Five Maximum access posterior lumbar interbody fusion;  Surgeon: Erline Levine, MD;  Location: [facility];  Service:  Neurosurgery;  Laterality: N/A;  L4-5 Maximum access posterior lumbar interbody fusion  . SHOULDER SURGERY     bilateral rotator cuff repairs  . STAPEDECTOMY Left    ear  . TUBAL LIGATION  1971    There were no vitals filed for this visit.  Subjective Assessment - 10/09/17 1151    Subjective  "The first night I slept without my sling I was sore, but now I'm sleeping better". She reports that she visited MD last week. She said he was pleased with her progress and that she is now allowed to lift light weight and reach behind back.     Currently in Pain?  Yes    Pain Score  3     Pain Location  Shoulder    Pain Orientation  Left    Pain Descriptors / Indicators  Sore;Tightness    Aggravating Factors   "doing something I'm not supposed to do"    Pain Relieving Factors  rest, ice.          [facility] PT Assessment - 10/09/17 0001      Assessment   Medical Diagnosis  Lt reverse shoulder    Referring Provider  Dr., Jasper Loser  Onset Date/Surgical Date  08/24/17    Hand Dominance  Right    Next MD Visit  11/20/17      PROM   Left Shoulder Flexion  138 Degrees supine, AAROM with cane    Left Shoulder External Rotation  36 Degrees scapular plane      Palpation   Palpation comment  tight and tender in Lt bicep, deltoid, upper shoulder, improved movement through incision anterior shoulder - steri strips gone.         [facility] Adult PT Treatment/Exercise - 10/09/17 0001      Shoulder Exercises: Supine   Flexion  AAROM;Left;20 reps cane      Shoulder Exercises: Seated   Flexion  Left;AROM;12 reps to 90 deg, VC to avoid shoulder hiking    Abduction  AROM;Left;12 reps to 90 deg, VC to avoid shoulder hiking      Shoulder Exercises: Standing   Other Standing Exercises  Lt shoulder flexion to ~90 deg to lift cone onto shelf at shoulder height (challenging) x 8 reps     Other Standing Exercises  Lt elbow/shoulder flex to lift 1# from counter to top of 5" container x 10 reps       Shoulder Exercises: Pulleys   Flexion  2 minutes      Shoulder Exercises: Isometric Strengthening   Flexion  5X5"    Extension  5X5" shoulder in neutral     External Rotation  5X5"    Internal Rotation  5X5"    ABduction  5X5"      Moist Heat Therapy   Number Minutes Moist Heat  15 Minutes    Moist Heat Location  Lumbar Spine;Cervical;Knee      Cryotherapy   Number Minutes Cryotherapy  15 Minutes    Cryotherapy Location  Shoulder Lt in supine    Type of Cryotherapy  Ice pack      Electrical Stimulation   Electrical Stimulation Location  Lt shoulder  (ant/post deltoid)    Electrical Stimulation Action  premod    Electrical Stimulation Parameters  to tolerance    Electrical Stimulation Goals  Pain      Manual Therapy   Soft tissue mobilization  STM to Lt bicep, deltoid and upper trap in supine    Passive ROM  Lt shoulder IR to 50 deg, ER and scaption to soft tissue limits and no pain.  Lt elbow into full extension.              PT Education - 10/09/17 1246    Education provided  Yes    Education Details  HEP -added remaining shoulder isometrics    Person(s) Educated  Patient    Methods  Explanation;Handout;Verbal cues;Demonstration;Tactile cues    Comprehension  Verbalized understanding;Returned demonstration       PT Short Term Goals - 10/05/17 1255      PT SHORT TERM GOAL #1   Title  I with initial HEP for shoulder per protocol ( 11/06/17)     Time  6    Period  Weeks    Status  Achieved      PT SHORT TERM GOAL #2   Title  demo Lt shoulder PROM WFL in supine position with minimal pain - per protocol ( 11/06/17)     Time  6    Period  Weeks    Status  On-going      PT SHORT TERM GOAL #3   Title  tolerate initial scapular stability ex without difficulty (  11/06/17)     Time  6    Period  Weeks    Status  Achieved      PT SHORT TERM GOAL #4   Title  report no more than 2/10 pain from muscle spasms in the Lt shoulder/neck ( 11/06/17)     Time  6    Period   Weeks    Status  Achieved      PT SHORT TERM GOAL #5   Title  improve FOTO =/< 65% limited ( 11/06/17)     Time  6    Period  Weeks    Status  On-going        PT Long Term Goals - 10/05/17 1301      PT LONG TERM GOAL #1   Title  I with advanced HEP for shoulder ( 12/18/17)     Time  12    Period  Weeks    Status  On-going      PT LONG TERM GOAL #2   Title  perform bathing/dressing I with minimal discomfort ( 12/18/17)     Time  12    Period  Weeks    Status  On-going      PT LONG TERM GOAL #3   Title  demo Lt shoulder AROM WFL to allow her to reach a shelf overhead with good mechanics ( 12/18/17)     Time  12    Period  Weeks    Status  On-going      PT LONG TERM GOAL #4   Title  demo Lt shoulder strength =/> 4+/5 to assist with daily housework ( 12/18/17)     Time  12    Period  Weeks    Status  On-going      PT LONG TERM GOAL #5   Title  Improve FOTO =/< 48% limited ( 12/18/17)     Time  12    Period  Weeks    Status  On-going            Plan - 10/09/17 1255    Clinical Impression Statement  Pt able to reach higher on wall ladder with AAROM in Lt shoulder. She required multiple cues to avoid compensatory strategies with all AROM exercises.  Her ROM in Lt shoulder is progressing well.  She reported decreased pain with use of MHP to Rt knee and low back, ice and estim to Lt shoulder.     Rehab Potential  Good    PT Frequency  2x / week    PT Duration  12 weeks    PT Treatment/Interventions  Dry needling;Manual techniques;Moist Heat;Ultrasound;Therapeutic activities;Patient/family education;Taping;Vasopneumatic Device;Therapeutic exercise;Cryotherapy;Electrical Stimulation;Passive range of motion;Scar mobilization    PT Next Visit Plan  continue Lt shoulder rehab per protocol.      Consulted and Agree with Plan of Care  Patient       Patient will benefit from skilled therapeutic intervention in order to improve the following deficits and impairments:  Pain,  Improper body mechanics, Postural dysfunction, Increased muscle spasms, Decreased scar mobility, Decreased range of motion, Decreased strength, Impaired UE functional use, Obesity, Increased edema  Visit Diagnosis: Stiffness of left shoulder, not elsewhere classified  Acute pain of left shoulder  Muscle weakness (generalized)     Problem List Patient Active Problem List   Diagnosis Date Noted  . Furuncle of vulva 12/06/2016  . Bilateral carpal tunnel syndrome 11/23/2016  . Neck pain 11/23/2016  . Primary osteoarthritis of both first  carpometacarpal joints 11/23/2016  . Diabetic nephropathy ([place]) 08/18/2016  . Right knee pain 01/03/2016  . Asymmetrical hearing loss of both ears 02/27/2015  . Pressure ulcer 11/10/2014  . Spondylolisthesis of lumbar region 11/07/2014  . CKD (chronic kidney disease), stage III ([place]) 07/24/2013  . Chest pain 07/23/2013  . Chronic pain 07/23/2013  . Fibromyalgia 07/23/2013  . Leukocytosis 02/07/2013  . Weakness generalized 02/07/2013  . Acute encephalopathy 02/05/2013  . Anemia 02/05/2013  . Acute renal failure ([place]) 02/05/2013  . Dehydration 09/12/2012  . Diabetes mellitus ([place]) 09/12/2012  . Hypertension 09/12/2012  . Hypothyroidism 09/12/2012  . Hyperlipidemia 09/12/2012   Kerin Perna, PTA 10/09/17 1:17 PM  [place], [place], [address] Phone: [phone]   Fax:  [phone]  Name: DEMIYA MAGNO MRN: [id number] Date of Birth: 15-Jul-1940

## 2017-10-11 ENCOUNTER — Ambulatory Visit (INDEPENDENT_AMBULATORY_CARE_PROVIDER_SITE_OTHER): Payer: Medicare Other | Admitting: Physical Therapy

## 2017-10-11 ENCOUNTER — Encounter: Payer: Self-pay | Admitting: Physical Therapy

## 2017-10-11 DIAGNOSIS — M62838 Other muscle spasm: Secondary | ICD-10-CM

## 2017-10-11 DIAGNOSIS — M25512 Pain in left shoulder: Secondary | ICD-10-CM

## 2017-10-11 DIAGNOSIS — M25612 Stiffness of left shoulder, not elsewhere classified: Secondary | ICD-10-CM | POA: Diagnosis present

## 2017-10-11 DIAGNOSIS — R399 Unspecified symptoms and signs involving the genitourinary system: Secondary | ICD-10-CM | POA: Diagnosis not present

## 2017-10-11 DIAGNOSIS — M6281 Muscle weakness (generalized): Secondary | ICD-10-CM

## 2017-10-11 NOTE — Therapy (Signed)
[facility] [place], [place], [address] Phone: [phone]   Fax:  [phone]  Physical Therapy Treatment  Patient Details  Name: Abigail [name] MRN: [id number] Date of Birth: February 02, 1941 Referring Provider: Dr., Jasper Loser   Encounter Date: 10/11/2017  PT End of Session - 10/11/17 1107    Visit Number  5    Number of Visits  24    Date for PT Re-Evaluation  12/18/17    PT Start Time  1107    PT Stop Time  1201    PT Time Calculation (min)  54 min    Activity Tolerance  Patient tolerated treatment well       Past Medical History:  Diagnosis Date  . Boils    near vaginal area  . Chronic kidney disease    stage 3; low kidney function; BUN was high acc to pt  . Constipation due to pain medication   . Depression   . Diabetes mellitus    Type 2  . Family hx of colon cancer   . Fibromyalgia   . Frequency of urination   . Frequent urination at night   . GERD (gastroesophageal reflux disease)    pt denies  . Heart murmur   . History of bladder infections   . Hyperlipidemia   . Hypertension   . Hypothyroidism   . Itchy eyes   . Obesity   . Obesity   . PONV (postoperative nausea and vomiting)    with stapedectomy  . Torn rotator cuff left   current    Past Surgical History:  Procedure Laterality Date  . BACK SURGERY     lumbar fusion  . CARPAL TUNNEL RELEASE Right 06/29/2017   Procedure: RIGHT CARPAL TUNNEL RELEASE;  Surgeon: Daryll Brod, MD;  Location: [place];  Service: Orthopedics;  Laterality: Right;  . CHOLECYSTECTOMY  1971  . COLONOSCOPY    . DILATION AND CURETTAGE OF UTERUS    . MAXIMUM ACCESS (MAS)POSTERIOR LUMBAR INTERBODY FUSION (PLIF) 1 LEVEL N/A 11/07/2014   Procedure: Lumbar Four-Five Maximum access posterior lumbar interbody fusion;  Surgeon: Erline Levine, MD;  Location: [place] [facility];  Service: Neurosurgery;  Laterality: N/A;  L4-5 Maximum access posterior  lumbar interbody fusion  . SHOULDER SURGERY     bilateral rotator cuff repairs  . STAPEDECTOMY Left    ear  . TUBAL LIGATION  1971    There were no vitals filed for this visit.  Subjective Assessment - 10/11/17 1107    Subjective  Abigail [name] reports the shoulder hurts sometimes with doing certain  motions.     Pertinent History  called MD ok to do more aggresiive exercise. however don't push passive extension and ER     Currently in Pain?  No/denies                       [facility] Adult PT Treatment/Exercise - 10/11/17 0001      Shoulder Exercises: Supine   Other Supine Exercises  2x10 10 sec holds thoracic lifts.     Other Supine Exercises  5x10 reps CW/CCW shoulder  Lt in 90 degrees flexion      Shoulder Exercises: Seated   Flexion  Both;12 reps;AROM to 90 degrees, VC to keep Lt shoulder out of ear.     Other Seated Exercises  15 scaption, bilat with VC to keep shoulder blades down and back.     Other Seated Exercises  bicep curls, 1#, arms at sides thumbs up, palms up, lower curls and upper curls, then triceps 3x10 with red band      Shoulder Exercises: Prone   Retraction  Strengthening;Left 3x8 with arm off EOB    Other Prone Exercises  3x8 reps scapular lifts with hand  by side.       Modalities   Modalities  Electrical Stimulation;Cryotherapy;Moist Heat      Moist Heat Therapy   Number Minutes Moist Heat  15 Minutes    Moist Heat Location  Knee;Lumbar Spine;Cervical      Cryotherapy   Number Minutes Cryotherapy  15 Minutes    Cryotherapy Location  Shoulder    Type of Cryotherapy  Ice pack      Electrical Stimulation   Electrical Stimulation Location  Lt shoulder  (ant/post deltoid)    Electrical Stimulation Action  premod    Electrical Stimulation Parameters  to tolerance    Electrical Stimulation Goals  Pain      Manual Therapy   Soft tissue mobilization  STM to Lt bicep    Passive ROM  Lt shoulder stretch into ER                PT Short  Term Goals - 10/05/17 1255      PT SHORT TERM GOAL #1   Title  I with initial HEP for shoulder per protocol ( 11/06/17)     Time  6    Period  Weeks    Status  Achieved      PT SHORT TERM GOAL #2   Title  demo Lt shoulder PROM WFL in supine position with minimal pain - per protocol ( 11/06/17)     Time  6    Period  Weeks    Status  On-going      PT SHORT TERM GOAL #3   Title  tolerate initial scapular stability ex without difficulty ( 11/06/17)     Time  6    Period  Weeks    Status  Achieved      PT SHORT TERM GOAL #4   Title  report no more than 2/10 pain from muscle spasms in the Lt shoulder/neck ( 11/06/17)     Time  6    Period  Weeks    Status  Achieved      PT SHORT TERM GOAL #5   Title  improve FOTO =/< 65% limited ( 11/06/17)     Time  6    Period  Weeks    Status  On-going        PT Long Term Goals - 10/05/17 1301      PT LONG TERM GOAL #1   Title  I with advanced HEP for shoulder ( 12/18/17)     Time  12    Period  Weeks    Status  On-going      PT LONG TERM GOAL #2   Title  perform bathing/dressing I with minimal discomfort ( 12/18/17)     Time  12    Period  Weeks    Status  On-going      PT LONG TERM GOAL #3   Title  demo Lt shoulder AROM WFL to allow her to reach a shelf overhead with good mechanics ( 12/18/17)     Time  12    Period  Weeks    Status  On-going      PT LONG TERM GOAL #4  Title  demo Lt shoulder strength =/> 4+/5 to assist with daily housework ( 12/18/17)     Time  12    Period  Weeks    Status  On-going      PT LONG TERM GOAL #5   Title  Improve FOTO =/< 48% limited ( 12/18/17)     Time  12    Period  Weeks    Status  On-going            Plan - 10/11/17 1200    Clinical Impression Statement  Abigail [name] fatigued with work on her periscapular muscles today.  This is needed to help stop compensatory motion used to elevate the Lt arm.  She required verbal and physical cues even when using a mirror.  She is starting to have some  tightness/pain in her Lt deltoid  On track per protocol.  MD ok'd to slowly increased exercise however avoid passive Lt shoulder extenstion and IR    Rehab Potential  Good    PT Frequency  2x / week    PT Duration  12 weeks    PT Treatment/Interventions  Dry needling;Manual techniques;Moist Heat;Ultrasound;Therapeutic activities;Patient/family education;Taping;Vasopneumatic Device;Therapeutic exercise;Cryotherapy;Electrical Stimulation;Passive range of motion;Scar mobilization    PT Next Visit Plan  possible [place] to Lt deltoid if still causing pain, scapular strengthening to encourage good body mechanics with Lt UE elevation    Consulted and Agree with Plan of Care  Patient       Patient will benefit from skilled therapeutic intervention in order to improve the following deficits and impairments:  Pain, Improper body mechanics, Postural dysfunction, Increased muscle spasms, Decreased scar mobility, Decreased range of motion, Decreased strength, Impaired UE functional use, Obesity, Increased edema  Visit Diagnosis: Stiffness of left shoulder, not elsewhere classified  Acute pain of left shoulder  Muscle weakness (generalized)  Other muscle spasm     Problem List Patient Active Problem List   Diagnosis Date Noted  . Furuncle of vulva 12/06/2016  . Bilateral carpal tunnel syndrome 11/23/2016  . Neck pain 11/23/2016  . Primary osteoarthritis of both first carpometacarpal joints 11/23/2016  . Diabetic nephropathy ([place]) 08/18/2016  . Right knee pain 01/03/2016  . Asymmetrical hearing loss of both ears 02/27/2015  . Pressure ulcer 11/10/2014  . Spondylolisthesis of lumbar region 11/07/2014  . CKD (chronic kidney disease), stage III ([place]) 07/24/2013  . Chest pain 07/23/2013  . Chronic pain 07/23/2013  . Fibromyalgia 07/23/2013  . Leukocytosis 02/07/2013  . Weakness generalized 02/07/2013  . Acute encephalopathy 02/05/2013  . Anemia 02/05/2013  . Acute renal failure ([place]) 02/05/2013   . Dehydration 09/12/2012  . Diabetes mellitus ([place]) 09/12/2012  . Hypertension 09/12/2012  . Hypothyroidism 09/12/2012  . Hyperlipidemia 09/12/2012    Jeral Pinch PT 10/11/2017, 12:04 PM  [facility] [place], [place], [address] Phone: [phone]   Fax:  [phone]  Name: Abigail [name] MRN: [id number] Date of Birth: 1941/01/13

## 2017-10-16 ENCOUNTER — Encounter: Payer: Self-pay | Admitting: Physical Therapy

## 2017-10-16 ENCOUNTER — Ambulatory Visit (INDEPENDENT_AMBULATORY_CARE_PROVIDER_SITE_OTHER): Payer: Medicare Other | Admitting: Physical Therapy

## 2017-10-16 DIAGNOSIS — M25512 Pain in left shoulder: Secondary | ICD-10-CM | POA: Diagnosis not present

## 2017-10-16 DIAGNOSIS — M25612 Stiffness of left shoulder, not elsewhere classified: Secondary | ICD-10-CM | POA: Diagnosis present

## 2017-10-16 DIAGNOSIS — M6281 Muscle weakness (generalized): Secondary | ICD-10-CM | POA: Diagnosis not present

## 2017-10-16 NOTE — Therapy (Signed)
[facility] [place], [place], [address] Phone: [phone]   Fax:  [phone]  Physical Therapy Treatment  Patient Details  Name: Abigail [name] MRN: [id number] Date of Birth: [birth year] Referring Provider: Dr., Jasper Loser   Encounter Date: 10/16/2017  PT End of Session - 10/16/17 1422    Visit Number  6    Number of Visits  24    Date for PT Re-Evaluation  12/18/17    PT Start Time  1149    PT Stop Time  1240    PT Time Calculation (min)  51 min    Activity Tolerance  Patient tolerated treatment well    Behavior During Therapy  [facility] for tasks assessed/performed       Past Medical History:  Diagnosis Date  . Boils    near vaginal area  . Chronic kidney disease    stage 3; low kidney function; BUN was high acc to pt  . Constipation due to pain medication   . Depression   . Diabetes mellitus    Type 2  . Family hx of colon cancer   . Fibromyalgia   . Frequency of urination   . Frequent urination at night   . GERD (gastroesophageal reflux disease)    pt denies  . Heart murmur   . History of bladder infections   . Hyperlipidemia   . Hypertension   . Hypothyroidism   . Itchy eyes   . Obesity   . Obesity   . PONV (postoperative nausea and vomiting)    with stapedectomy  . Torn rotator cuff left   current    Past Surgical History:  Procedure Laterality Date  . BACK SURGERY     lumbar fusion  . CARPAL TUNNEL RELEASE Right 06/29/2017   Procedure: RIGHT CARPAL TUNNEL RELEASE;  Surgeon: Daryll Brod, MD;  Location: [place];  Service: Orthopedics;  Laterality: Right;  . CHOLECYSTECTOMY  1971  . COLONOSCOPY    . DILATION AND CURETTAGE OF UTERUS    . MAXIMUM ACCESS (MAS)POSTERIOR LUMBAR INTERBODY FUSION (PLIF) 1 LEVEL N/A 11/07/2014   Procedure: Lumbar Four-Five Maximum access posterior lumbar interbody fusion;  Surgeon: Erline Levine, MD;  Location: [place] [facility];  Service:  Neurosurgery;  Laterality: N/A;  L4-5 Maximum access posterior lumbar interbody fusion  . SHOULDER SURGERY     bilateral rotator cuff repairs  . STAPEDECTOMY Left    ear  . TUBAL LIGATION  1971    There were no vitals filed for this visit.  Subjective Assessment - 10/16/17 1207    Subjective  Pt reports she has been slacking off of exercises since she has been hurting in other parts of body.  Front of Lt shoulder has been more sore; bra strap bothering incision.     Currently in Pain?  Yes    Pain Score  4     Pain Location  Shoulder    Pain Orientation  Left    Pain Descriptors / Indicators  Sore;Tightness    Aggravating Factors   "doing something I'm not supposed to do"    Pain Relieving Factors  rest, ice         [facility] PT Assessment - 10/16/17 0001      Assessment   Medical Diagnosis  Lt reverse shoulder  (Pended)     Referring Provider  Dr. Jasper Loser  (Pended)     Onset Date/Surgical Date  08/24/17  (Pended)  Hand Dominance  Right  (Pended)     Next MD Visit  11/20/17  Roni Bread)        [facility] Adult PT Treatment/Exercise - 10/16/17 0001      Shoulder Exercises: Supine   External Rotation  AAROM;Left;10 reps cane, scapular plane, shoulder supported in neutral     Flexion  AAROM;Left;20 reps;AROM cane      Shoulder Exercises: Seated   Flexion  Both;12 reps;AROM to 90 degrees, VC to keep Lt shoulder out of ear.     Other Seated Exercises  15 scaption, bilat with VC to keep shoulder blades down and back.  mirror for feedback.       Shoulder Exercises: Standing   Other Standing Exercises  Lt shoulder flexion wall ladder to tolerance, (up to #26) x 5 reps; VC to keep shoulder from elevating       Shoulder Exercises: Pulleys   Flexion  3 minutes      Moist Heat Therapy   Number Minutes Moist Heat  15 Minutes    Moist Heat Location  Knee;Lumbar Spine;Cervical      Cryotherapy   Number Minutes Cryotherapy  15 Minutes    Cryotherapy Location  Shoulder    Type of  Cryotherapy  Ice pack      Electrical Stimulation   Electrical Stimulation Location  Lt shoulder  (ant/post deltoid)    Electrical Stimulation Action  premod    Electrical Stimulation Parameters  to tolerance    Electrical Stimulation Goals  Pain      Ultrasound   Ultrasound Location  Lt anterior shoulder    Ultrasound Parameters  50%, 1.0 w/cm2, 1.0 MHz, 8 min     Ultrasound Goals  Pain      Manual Therapy   Manual Therapy  Scapular mobilization;Soft tissue mobilization;Taping    Manual therapy comments  Sensitive skin rock tape applied in zigzag pattern with 10% stretch over Lt shoulder incision to decrease pain and sensitivity and assist with scar management.     Soft tissue mobilization  STM to Lt tricep with muscle on stretch; TPR to Lt levator, upper trap and rhomboid.     Scapular Mobilization  Lt scapula in all directions.     Passive ROM  Lt shoulder stretch into ER; Lt shoulder scaption to tissue limits and no pain.                 PT Short Term Goals - 10/05/17 1255      PT SHORT TERM GOAL #1   Title  I with initial HEP for shoulder per protocol ( 11/06/17)     Time  6    Period  Weeks    Status  Achieved      PT SHORT TERM GOAL #2   Title  demo Lt shoulder PROM WFL in supine position with minimal pain - per protocol ( 11/06/17)     Time  6    Period  Weeks    Status  On-going      PT SHORT TERM GOAL #3   Title  tolerate initial scapular stability ex without difficulty ( 11/06/17)     Time  6    Period  Weeks    Status  Achieved      PT SHORT TERM GOAL #4   Title  report no more than 2/10 pain from muscle spasms in the Lt shoulder/neck ( 11/06/17)     Time  6    Period  Weeks  Status  Achieved      PT SHORT TERM GOAL #5   Title  improve FOTO =/< 65% limited ( 11/06/17)     Time  6    Period  Weeks    Status  On-going        PT Long Term Goals - 10/05/17 1301      PT LONG TERM GOAL #1   Title  I with advanced HEP for shoulder ( 12/18/17)      Time  12    Period  Weeks    Status  On-going      PT LONG TERM GOAL #2   Title  perform bathing/dressing I with minimal discomfort ( 12/18/17)     Time  12    Period  Weeks    Status  On-going      PT LONG TERM GOAL #3   Title  demo Lt shoulder AROM WFL to allow her to reach a shelf overhead with good mechanics ( 12/18/17)     Time  12    Period  Weeks    Status  On-going      PT LONG TERM GOAL #4   Title  demo Lt shoulder strength =/> 4+/5 to assist with daily housework ( 12/18/17)     Time  12    Period  Weeks    Status  On-going      PT LONG TERM GOAL #5   Title  Improve FOTO =/< 48% limited ( 12/18/17)     Time  12    Period  Weeks    Status  On-going            Plan - 10/16/17 1428    Clinical Impression Statement  Pt demonstrated slight improvement in active flexion and scaption to 90 deg with less compensatory motions.  Continues to require verbal and physical cues to correct these motions.  She had some incresaed tightness in Lt posterior shoulder and tricep; improved with manual therapy.  Progressing gradually towards goals.     Rehab Potential  Good    PT Frequency  2x / week    PT Duration  12 weeks    PT Treatment/Interventions  Dry needling;Manual techniques;Moist Heat;Ultrasound;Therapeutic activities;Patient/family education;Taping;Vasopneumatic Device;Therapeutic exercise;Cryotherapy;Electrical Stimulation;Passive range of motion;Scar mobilization    PT Next Visit Plan  assess response to [place] to Lt deltoid, repeat if still causing pain; scapular strengthening to encourage good body mechanics with Lt UE elevation    Consulted and Agree with Plan of Care  Patient       Patient will benefit from skilled therapeutic intervention in order to improve the following deficits and impairments:  Pain, Improper body mechanics, Postural dysfunction, Increased muscle spasms, Decreased scar mobility, Decreased range of motion, Decreased strength, Impaired UE functional use,  Obesity, Increased edema  Visit Diagnosis: Stiffness of left shoulder, not elsewhere classified  Acute pain of left shoulder  Muscle weakness (generalized)     Problem List Patient Active Problem List   Diagnosis Date Noted  . Furuncle of vulva 12/06/2016  . Bilateral carpal tunnel syndrome 11/23/2016  . Neck pain 11/23/2016  . Primary osteoarthritis of both first carpometacarpal joints 11/23/2016  . Diabetic nephropathy ([place]) 08/18/2016  . Right knee pain 01/03/2016  . Asymmetrical hearing loss of both ears 02/27/2015  . Pressure ulcer 11/10/2014  . Spondylolisthesis of lumbar region 11/07/2014  . CKD (chronic kidney disease), stage III ([place]) 07/24/2013  . Chest pain 07/23/2013  . Chronic pain 07/23/2013  .  Fibromyalgia 07/23/2013  . Leukocytosis 02/07/2013  . Weakness generalized 02/07/2013  . Acute encephalopathy 02/05/2013  . Anemia 02/05/2013  . Acute renal failure ([place]) 02/05/2013  . Dehydration 09/12/2012  . Diabetes mellitus ([place]) 09/12/2012  . Hypertension 09/12/2012  . Hypothyroidism 09/12/2012  . Hyperlipidemia 09/12/2012   Kerin Perna, PTA 10/16/17 5:15 PM  [place], [place], [address] Phone: [phone]   Fax:  [phone]  Name: Abigail [name] MRN: [id number] Date of Birth: 11-12-1940

## 2017-10-20 ENCOUNTER — Encounter: Payer: Self-pay | Admitting: Physical Therapy

## 2017-10-20 ENCOUNTER — Ambulatory Visit (INDEPENDENT_AMBULATORY_CARE_PROVIDER_SITE_OTHER): Payer: Medicare Other | Admitting: Physical Therapy

## 2017-10-20 DIAGNOSIS — M25612 Stiffness of left shoulder, not elsewhere classified: Secondary | ICD-10-CM | POA: Diagnosis present

## 2017-10-20 DIAGNOSIS — M62838 Other muscle spasm: Secondary | ICD-10-CM

## 2017-10-20 DIAGNOSIS — M6281 Muscle weakness (generalized): Secondary | ICD-10-CM

## 2017-10-20 DIAGNOSIS — M25512 Pain in left shoulder: Secondary | ICD-10-CM | POA: Diagnosis not present

## 2017-10-20 NOTE — Therapy (Signed)
[facility] [place], [place], [address] Phone: [phone]   Fax:  [phone]  Physical Therapy Treatment  Patient Details  Name: Abigail [name] MRN: [id number] Date of Birth: 1941/04/25 Referring Provider: Dr., Jasper Loser   Encounter Date: 10/20/2017  PT End of Session - 10/20/17 1148    Visit Number  7    Number of Visits  24    Date for PT Re-Evaluation  12/18/17    PT Start Time  1148    PT Stop Time  1237    PT Time Calculation (min)  49 min    Activity Tolerance  Patient tolerated treatment well       Past Medical History:  Diagnosis Date  . Boils    near vaginal area  . Chronic kidney disease    stage 3; low kidney function; BUN was high acc to pt  . Constipation due to pain medication   . Depression   . Diabetes mellitus    Type 2  . Family hx of colon cancer   . Fibromyalgia   . Frequency of urination   . Frequent urination at night   . GERD (gastroesophageal reflux disease)    pt denies  . Heart murmur   . History of bladder infections   . Hyperlipidemia   . Hypertension   . Hypothyroidism   . Itchy eyes   . Obesity   . Obesity   . PONV (postoperative nausea and vomiting)    with stapedectomy  . Torn rotator cuff left   current    Past Surgical History:  Procedure Laterality Date  . BACK SURGERY     lumbar fusion  . CARPAL TUNNEL RELEASE Right 06/29/2017   Procedure: RIGHT CARPAL TUNNEL RELEASE;  Surgeon: Daryll Brod, MD;  Location: [facility];  Service: Orthopedics;  Laterality: Right;  . CHOLECYSTECTOMY  1971  . COLONOSCOPY    . DILATION AND CURETTAGE OF UTERUS    . MAXIMUM ACCESS (MAS)POSTERIOR LUMBAR INTERBODY FUSION (PLIF) 1 LEVEL N/A 11/07/2014   Procedure: Lumbar Four-Five Maximum access posterior lumbar interbody fusion;  Surgeon: Erline Levine, MD;  Location: [facility];  Service: Neurosurgery;  Laterality: N/A;  L4-5 Maximum access posterior  lumbar interbody fusion  . SHOULDER SURGERY     bilateral rotator cuff repairs  . STAPEDECTOMY Left    ear  . TUBAL LIGATION  1971    There were no vitals filed for this visit.  Subjective Assessment - 10/20/17 1151    Subjective  Abigail [name] reports her back and knees are hurting her a lot from her arthritis. Only has pain in shoulder with certain motions.     Patient Stated Goals  brush her hair and fix it.     Currently in Pain?  No/denies at rest                       [facility] Adult PT Treatment/Exercise - 10/20/17 0001      Shoulder Exercises: Supine   Flexion  Strengthening;Right;10 reps;Weights VC and physical cues for form,punch up to 90    Shoulder Flexion Weight (lbs)  1    Other Supine Exercises  CW/CCW arm circle arm flexed to 90, set one with 1#, 2 with 2#    Other Supine Exercises  bilat arms behind head with scap retraction and gentle elbow presses      Shoulder Exercises: Prone   Other Prone Exercises  3x8 scap retraction with arm off EOB, then with arm out to the side      Shoulder Exercises: Pulleys   Flexion  3 minutes      Shoulder Exercises: Stretch   Other Shoulder Stretches  overhead stretch bilat with strap around wrists.       Modalities   Modalities  Electrical Stimulation;Cryotherapy;Moist Heat      Moist Heat Therapy   Number Minutes Moist Heat  15 Minutes    Moist Heat Location  Cervical;Lumbar Spine;Knee      Cryotherapy   Number Minutes Cryotherapy  15 Minutes    Cryotherapy Location  Shoulder    Type of Cryotherapy  Ice pack      Electrical Stimulation   Electrical Stimulation Location  Lt shoulder  (ant/post deltoid)    Electrical Stimulation Action  IFC    Electrical Stimulation Parameters  to tolerance    Electrical Stimulation Goals  Wound      Manual Therapy   Manual Therapy  Soft tissue mobilization    Soft tissue mobilization  STM to Lt deltoid, subscap and teres minor/major in posterior Lt shoulder.                 PT Short Term Goals - 10/05/17 1255      PT SHORT TERM GOAL #1   Title  I with initial HEP for shoulder per protocol ( 11/06/17)     Time  6    Period  Weeks    Status  Achieved      PT SHORT TERM GOAL #2   Title  demo Lt shoulder PROM WFL in supine position with minimal pain - per protocol ( 11/06/17)     Time  6    Period  Weeks    Status  On-going      PT SHORT TERM GOAL #3   Title  tolerate initial scapular stability ex without difficulty ( 11/06/17)     Time  6    Period  Weeks    Status  Achieved      PT SHORT TERM GOAL #4   Title  report no more than 2/10 pain from muscle spasms in the Lt shoulder/neck ( 11/06/17)     Time  6    Period  Weeks    Status  Achieved      PT SHORT TERM GOAL #5   Title  improve FOTO =/< 65% limited ( 11/06/17)     Time  6    Period  Weeks    Status  On-going        PT Long Term Goals - 10/05/17 1301      PT LONG TERM GOAL #1   Title  I with advanced HEP for shoulder ( 12/18/17)     Time  12    Period  Weeks    Status  On-going      PT LONG TERM GOAL #2   Title  perform bathing/dressing I with minimal discomfort ( 12/18/17)     Time  12    Period  Weeks    Status  On-going      PT LONG TERM GOAL #3   Title  demo Lt shoulder AROM WFL to allow her to reach a shelf overhead with good mechanics ( 12/18/17)     Time  12    Period  Weeks    Status  On-going      PT LONG TERM GOAL #4  Title  demo Lt shoulder strength =/> 4+/5 to assist with daily housework ( 12/18/17)     Time  12    Period  Weeks    Status  On-going      PT LONG TERM GOAL #5   Title  Improve FOTO =/< 48% limited ( 12/18/17)     Time  12    Period  Weeks    Status  On-going            Plan - 10/20/17 1225    Clinical Impression Statement  Abigail [name] is doing a little better with isolating Lt shoulder motion and not using as much compensatory motion with elevation.  She is stll having pain into the upper arm - deltoid and has tightness and  pain in the posterior shoulder.      Rehab Potential  Good    PT Frequency  2x / week    PT Duration  12 weeks    PT Treatment/Interventions  Dry needling;Manual techniques;Moist Heat;Ultrasound;Therapeutic activities;Patient/family education;Taping;Vasopneumatic Device;Therapeutic exercise;Cryotherapy;Electrical Stimulation;Passive range of motion;Scar mobilization    PT Next Visit Plan  cont with taping and shoulder rehab    Consulted and Agree with Plan of Care  Patient       Patient will benefit from skilled therapeutic intervention in order to improve the following deficits and impairments:  Pain, Improper body mechanics, Postural dysfunction, Increased muscle spasms, Decreased scar mobility, Decreased range of motion, Decreased strength, Impaired UE functional use, Obesity, Increased edema  Visit Diagnosis: Stiffness of left shoulder, not elsewhere classified  Acute pain of left shoulder  Muscle weakness (generalized)  Other muscle spasm     Problem List Patient Active Problem List   Diagnosis Date Noted  . Furuncle of vulva 12/06/2016  . Bilateral carpal tunnel syndrome 11/23/2016  . Neck pain 11/23/2016  . Primary osteoarthritis of both first carpometacarpal joints 11/23/2016  . Diabetic nephropathy ([place]) 08/18/2016  . Right knee pain 01/03/2016  . Asymmetrical hearing loss of both ears 02/27/2015  . Pressure ulcer 11/10/2014  . Spondylolisthesis of lumbar region 11/07/2014  . CKD (chronic kidney disease), stage III ([place]) 07/24/2013  . Chest pain 07/23/2013  . Chronic pain 07/23/2013  . Fibromyalgia 07/23/2013  . Leukocytosis 02/07/2013  . Weakness generalized 02/07/2013  . Acute encephalopathy 02/05/2013  . Anemia 02/05/2013  . Acute renal failure ([place]) 02/05/2013  . Dehydration 09/12/2012  . Diabetes mellitus ([place]) 09/12/2012  . Hypertension 09/12/2012  . Hypothyroidism 09/12/2012  . Hyperlipidemia 09/12/2012    Jeral Pinch PT  10/20/2017, 12:29  PM  [facility] [place], [place], [address] Phone: [phone]   Fax:  [phone]  Name: Abigail [name] MRN: [id number] Date of Birth: 04/14/1941

## 2017-10-24 DIAGNOSIS — M5412 Radiculopathy, cervical region: Secondary | ICD-10-CM | POA: Diagnosis not present

## 2017-10-24 DIAGNOSIS — M545 Low back pain: Secondary | ICD-10-CM | POA: Diagnosis not present

## 2017-10-25 ENCOUNTER — Ambulatory Visit (INDEPENDENT_AMBULATORY_CARE_PROVIDER_SITE_OTHER): Payer: Medicare Other | Admitting: Physical Therapy

## 2017-10-25 ENCOUNTER — Encounter: Payer: Self-pay | Admitting: Physical Therapy

## 2017-10-25 DIAGNOSIS — M62838 Other muscle spasm: Secondary | ICD-10-CM

## 2017-10-25 DIAGNOSIS — M25612 Stiffness of left shoulder, not elsewhere classified: Secondary | ICD-10-CM | POA: Diagnosis present

## 2017-10-25 DIAGNOSIS — M25512 Pain in left shoulder: Secondary | ICD-10-CM

## 2017-10-25 DIAGNOSIS — M6281 Muscle weakness (generalized): Secondary | ICD-10-CM

## 2017-10-25 NOTE — Therapy (Signed)
[place], [place], [address] Phone: [phone]   Fax:  [phone]  Physical Therapy Treatment  Patient Details  Name: Abigail [name] MRN: [id number] Date of Birth: 09/20/1940 Referring Provider: Dr., Jasper Loser    Encounter Date: 10/25/2017  PT End of Session - 10/25/17 1102    Visit Number  8    Number of Visits  24    Date for PT Re-Evaluation  12/18/17    PT Start Time  38    PT Stop Time  7425    PT Time Calculation (min)  62 min       Past Medical History:  Diagnosis Date  . Boils    near vaginal area  . Chronic kidney disease    stage 3; low kidney function; BUN was high acc to pt  . Constipation due to pain medication   . Depression   . Diabetes mellitus    Type 2  . Family hx of colon cancer   . Fibromyalgia   . Frequency of urination   . Frequent urination at night   . GERD (gastroesophageal reflux disease)    pt denies  . Heart murmur   . History of bladder infections   . Hyperlipidemia   . Hypertension   . Hypothyroidism   . Itchy eyes   . Obesity   . Obesity   . PONV (postoperative nausea and vomiting)    with stapedectomy  . Torn rotator cuff left   current    Past Surgical History:  Procedure Laterality Date  . BACK SURGERY     lumbar fusion  . CARPAL TUNNEL RELEASE Right 06/29/2017   Procedure: RIGHT CARPAL TUNNEL RELEASE;  Surgeon: Daryll Brod, MD;  Location: [facility];  Service: Orthopedics;  Laterality: Right;  . CHOLECYSTECTOMY  1971  . COLONOSCOPY    . DILATION AND CURETTAGE OF UTERUS    . MAXIMUM ACCESS (MAS)POSTERIOR LUMBAR INTERBODY FUSION (PLIF) 1 LEVEL N/A 11/07/2014   Procedure: Lumbar Four-Five Maximum access posterior lumbar interbody fusion;  Surgeon: Erline Levine, MD;  Location: [facility];  Service: Neurosurgery;  Laterality: N/A;  L4-5 Maximum access posterior lumbar interbody fusion  . SHOULDER SURGERY      bilateral rotator cuff repairs  . STAPEDECTOMY Left    ear  . TUBAL LIGATION  1971    There were no vitals filed for this visit.  Subjective Assessment - 10/25/17 1107    Subjective  Abigail [name] reports she is hurting all over and her Rt foot is swelling up for the last couple of days.  She saw pain MD yesterday and is going to try an injection the middle of June. Shoulder is some sore.     Patient Stated Goals  brush her hair and fix it.     Currently in Pain?  No/denies    Pain Score  2     Pain Location  Shoulder    Pain Orientation  Left    Pain Descriptors / Indicators  Dull    Pain Type  Surgical pain    Pain Onset  More than a month ago    Pain Frequency  Intermittent    Aggravating Factors   lifitng    Pain Relieving Factors  rest and ice.          [facility] PT Assessment - 10/25/17 0001      Assessment   Medical Diagnosis  Lt reverse shoulder  PROM   Left Shoulder Flexion  146 Degrees    Left Shoulder External Rotation  40 Degrees                   [facility] Adult PT Treatment/Exercise - 10/25/17 0001      Shoulder Exercises: Supine   External Rotation  Strengthening;Both;15 reps;Theraband isometric with small ROM into ER    Theraband Level (Shoulder External Rotation)  Level 1 (Yellow)    Flexion  15 reps;Theraband overhead pull    Theraband Level (Shoulder Flexion)  Level 1 (Yellow)    Other Supine Exercises  hands behind head with gentle elbow  presses       Shoulder Exercises: Seated   Other Seated Exercises  30 reps triceps and biceps with yellow band      Shoulder Exercises: Prone   Other Prone Exercises  20 reps each, scap retraction, arm at side, arm off EOF dangling and held up ~ 45 degrees.       Shoulder Exercises: ROM/Strengthening   Nustep  arms only L1x5'.       Modalities   Modalities  Electrical Stimulation;Moist Heat      Moist Heat Therapy   Number Minutes Moist Heat  15 Minutes    Moist Heat Location  Shoulder;Cervical;Lumbar  Spine;Knee      Electrical Stimulation   Electrical Stimulation Location  Lt shoulder and upper arm    Electrical Stimulation Action  IFC    Electrical Stimulation Parameters  to tolerance    Electrical Stimulation Goals  Pain               PT Short Term Goals - 10/25/17 1108      PT SHORT TERM GOAL #1   Title  I with initial HEP for shoulder per protocol ( 11/06/17)     Status  Achieved      PT SHORT TERM GOAL #2   Title  demo Lt shoulder PROM WFL in supine position with minimal pain - per protocol ( 11/06/17)     Status  Achieved      PT SHORT TERM GOAL #3   Title  tolerate initial scapular stability ex without difficulty ( 11/06/17)     Status  Achieved      PT SHORT TERM GOAL #4   Title  report no more than 2/10 pain from muscle spasms in the Lt shoulder/neck ( 11/06/17)     Status  Achieved      PT SHORT TERM GOAL #5   Title  improve FOTO =/< 65% limited ( 11/06/17)     Status  On-going        PT Long Term Goals - 10/25/17 1151      PT LONG TERM GOAL #1   Title  I with advanced HEP for shoulder ( 12/18/17)     Status  On-going      PT LONG TERM GOAL #2   Title  perform bathing/dressing I with minimal discomfort ( 12/18/17)     Status  On-going      PT LONG TERM GOAL #3   Title  demo Lt shoulder AROM WFL to allow her to reach a shelf overhead with good mechanics ( 12/18/17)     Status  On-going      PT LONG TERM GOAL #4   Title  demo Lt shoulder strength =/> 4+/5 to assist with daily housework ( 12/18/17)     Status  On-going  PT LONG TERM GOAL #5   Title  Improve FOTO =/< 48% limited ( 12/18/17)     Status  On-going            Plan - 10/25/17 1151    Clinical Impression Statement  Abigail [name]'s shoulder ROM continues to improve, strength is slower per protocol.  Still has some compensatory motion of the Lt shoulder complex with elevation.  Scapular strengthening for Abigail [name] is challenging due to restrictions and overall body pain she has.  Even with  this she is meeting most of her STGs.     Rehab Potential  Good    PT Frequency  2x / week    PT Duration  12 weeks    PT Treatment/Interventions  Dry needling;Manual techniques;Moist Heat;Ultrasound;Therapeutic activities;Patient/family education;Taping;Vasopneumatic Device;Therapeutic exercise;Cryotherapy;Electrical Stimulation;Passive range of motion;Scar mobilization    PT Next Visit Plan  FOTO to update STGs    Consulted and Agree with Plan of Care  Patient       Patient will benefit from skilled therapeutic intervention in order to improve the following deficits and impairments:  Pain, Improper body mechanics, Postural dysfunction, Increased muscle spasms, Decreased scar mobility, Decreased range of motion, Decreased strength, Impaired UE functional use, Obesity, Increased edema  Visit Diagnosis: Stiffness of left shoulder, not elsewhere classified  Acute pain of left shoulder  Muscle weakness (generalized)  Other muscle spasm     Problem List Patient Active Problem List   Diagnosis Date Noted  . Furuncle of vulva 12/06/2016  . Bilateral carpal tunnel syndrome 11/23/2016  . Neck pain 11/23/2016  . Primary osteoarthritis of both first carpometacarpal joints 11/23/2016  . Diabetic nephropathy ([place]) 08/18/2016  . Right knee pain 01/03/2016  . Asymmetrical hearing loss of both ears 02/27/2015  . Pressure ulcer 11/10/2014  . Spondylolisthesis of lumbar region 11/07/2014  . CKD (chronic kidney disease), stage III ([place]) 07/24/2013  . Chest pain 07/23/2013  . Chronic pain 07/23/2013  . Fibromyalgia 07/23/2013  . Leukocytosis 02/07/2013  . Weakness generalized 02/07/2013  . Acute encephalopathy 02/05/2013  . Anemia 02/05/2013  . Acute renal failure ([place]) 02/05/2013  . Dehydration 09/12/2012  . Diabetes mellitus ([place]) 09/12/2012  . Hypertension 09/12/2012  . Hypothyroidism 09/12/2012  . Hyperlipidemia 09/12/2012    Jeral Pinch PT  10/25/2017, 11:53 AM  [facility] [place], [place], [address] Phone: [phone]   Fax:  [phone]  Name: Abigail [name] MRN: [id number] Date of Birth: 29-Oct-1940

## 2017-10-26 DIAGNOSIS — R2241 Localized swelling, mass and lump, right lower limb: Secondary | ICD-10-CM | POA: Diagnosis not present

## 2017-10-26 DIAGNOSIS — M1711 Unilateral primary osteoarthritis, right knee: Secondary | ICD-10-CM | POA: Diagnosis not present

## 2017-10-27 ENCOUNTER — Ambulatory Visit (INDEPENDENT_AMBULATORY_CARE_PROVIDER_SITE_OTHER): Payer: Medicare Other | Admitting: Physical Therapy

## 2017-10-27 DIAGNOSIS — M25512 Pain in left shoulder: Secondary | ICD-10-CM

## 2017-10-27 DIAGNOSIS — M25612 Stiffness of left shoulder, not elsewhere classified: Secondary | ICD-10-CM

## 2017-10-27 DIAGNOSIS — M6281 Muscle weakness (generalized): Secondary | ICD-10-CM | POA: Diagnosis not present

## 2017-10-27 NOTE — Patient Instructions (Signed)
Over Head Pull: Narrow Grip     [place] [phone]   On back, knees bent, feet flat, band across thighs, elbows straight but relaxed. Pull hands apart (start). Keeping elbows straight, bring arms up and over head, hands toward floor. Keep pull steady on band. Hold momentarily. Return slowly, keeping pull steady, back to start. Repeat _10__ times. Band color _yellow_____   Elbow Press    Interlace fingers; bring hands underneath head. Press elbows down. Hold _10__ seconds. Relax arms. Repeat _3-5__ times.   [facility] [place],  [address]  [phone] (office) [phone] (fax)

## 2017-10-27 NOTE — Therapy (Signed)
[place], [place], [address] Phone: [phone]   Fax:  [phone]  Physical Therapy Treatment  Patient Details  Name: Abigail [name] MRN: [id number] Date of Birth: Feb 15, 1941 Referring Provider: Dr. Jasper Loser   Encounter Date: 10/27/2017  PT End of Session - 10/27/17 1143    Visit Number  9    Number of Visits  24    Date for PT Re-Evaluation  12/18/17    PT Start Time  1102    PT Stop Time  1157 MHP/ice last 15 min     PT Time Calculation (min)  55 min       Past Medical History:  Diagnosis Date  . Boils    near vaginal area  . Chronic kidney disease    stage 3; low kidney function; BUN was high acc to pt  . Constipation due to pain medication   . Depression   . Diabetes mellitus    Type 2  . Family hx of colon cancer   . Fibromyalgia   . Frequency of urination   . Frequent urination at night   . GERD (gastroesophageal reflux disease)    pt denies  . Heart murmur   . History of bladder infections   . Hyperlipidemia   . Hypertension   . Hypothyroidism   . Itchy eyes   . Obesity   . Obesity   . PONV (postoperative nausea and vomiting)    with stapedectomy  . Torn rotator cuff left   current    Past Surgical History:  Procedure Laterality Date  . BACK SURGERY     lumbar fusion  . CARPAL TUNNEL RELEASE Right 06/29/2017   Procedure: RIGHT CARPAL TUNNEL RELEASE;  Surgeon: Daryll Brod, MD;  Location: [facility];  Service: Orthopedics;  Laterality: Right;  . CHOLECYSTECTOMY  1971  . COLONOSCOPY    . DILATION AND CURETTAGE OF UTERUS    . MAXIMUM ACCESS (MAS)POSTERIOR LUMBAR INTERBODY FUSION (PLIF) 1 LEVEL N/A 11/07/2014   Procedure: Lumbar Four-Five Maximum access posterior lumbar interbody fusion;  Surgeon: Erline Levine, MD;  Location: [facility];  Service: Neurosurgery;  Laterality: N/A;  L4-5 Maximum access posterior lumbar interbody fusion  . SHOULDER  SURGERY     bilateral rotator cuff repairs  . STAPEDECTOMY Left    ear  . TUBAL LIGATION  1971    There were no vitals filed for this visit.  Subjective Assessment - 10/27/17 1112    Subjective  Pt reports she is hurting everywhere - especially in knees and back.  She thinks the motion in Lt shoulder is improving; still having difficulty brushing hair.     Currently in Pain?  Yes    Pain Score  1     Pain Location  Shoulder    Pain Orientation  Left    Pain Descriptors / Indicators  Dull    Pain Score  4    Pain Location  Knee    Pain Orientation  Left;Right    Pain Descriptors / Indicators  Aching    Aggravating Factors   walking, stairs, bending    Pain Relieving Factors  medicine.          [facility] PT Assessment - 10/27/17 0001      Assessment   Medical Diagnosis  Lt reverse shoulder    Referring Provider  Dr. Jasper Loser    Onset Date/Surgical Date  08/24/17    Hand Dominance  Right    Next MD Visit  11/20/17       [facility] Adult PT Treatment/Exercise - 10/27/17 0001      Shoulder Exercises: Supine   External Rotation  AAROM;Strengthening;Left;10 reps;Theraband cane, then band - small range    Theraband Level (Shoulder External Rotation)  Level 1 (Yellow)    Flexion  AAROM;Strengthening;Both;10 reps;Theraband cane x 10, band x 10    Theraband Level (Shoulder Flexion)  Level 1 (Yellow)    Other Supine Exercises  hands behind head with gentle elbow  presses, 15 sec x 5 reps       Shoulder Exercises: Seated   External Rotation  AAROM;Strengthening;Both;10 reps;Theraband;Limitations cane with AAROM, then band    Theraband Level (Shoulder External Rotation)  Level 1 (Yellow)    External Rotation Limitations  Lt isometric for ER    Other Seated Exercises  30 reps Lt tricep ext with yellow band; Lt bicep curls with 2# x 15 reps, 3# x 5 reps      Shoulder Exercises: Pulleys   Flexion  3 minutes    Scaption  3 minutes      Shoulder Exercises: ROM/Strengthening   Rhythmic  Stabilization, Supine  LUE 45 deg, 90 deg, 120 deg x 20 sec each position       Moist Heat Therapy   Number Minutes Moist Heat  5 Minutes    Moist Heat Location  Lumbar Spine;Knee;Cervical      Cryotherapy   Number Minutes Cryotherapy  15 Minutes    Cryotherapy Location  Shoulder Lt    Type of Cryotherapy  Ice pack      Electrical Stimulation   Electrical Stimulation Location  -- pt declined      Manual Therapy   Passive ROM  Lt shoulder ER, to tissue tolerance and no pain.              PT Education - 10/27/17 1150    Education provided  Yes    Education Details  HEP     Person(s) Educated  Patient    Methods  Explanation;Handout;Verbal cues;Demonstration;Tactile cues    Comprehension  Verbalized understanding;Returned demonstration       PT Short Term Goals - 10/27/17 1223      PT SHORT TERM GOAL #1   Title  I with initial HEP for shoulder per protocol ( 11/06/17)     Time  6    Period  Weeks    Status  Achieved      PT SHORT TERM GOAL #2   Title  demo Lt shoulder PROM WFL in supine position with minimal pain - per protocol ( 11/06/17)     Time  6    Period  Weeks    Status  Achieved      PT SHORT TERM GOAL #3   Title  tolerate initial scapular stability ex without difficulty ( 11/06/17)     Time  6    Period  Weeks    Status  Achieved      PT SHORT TERM GOAL #4   Title  report no more than 2/10 pain from muscle spasms in the Lt shoulder/neck ( 11/06/17)     Time  6    Period  Weeks      PT SHORT TERM GOAL #5   Title  improve FOTO =/< 65% limited ( 11/06/17)     Time  6    Period  Weeks    Status  Achieved  PT Long Term Goals - 10/27/17 1224      PT LONG TERM GOAL #1   Title  I with advanced HEP for shoulder ( 12/18/17)     Time  12    Period  Weeks    Status  On-going      PT LONG TERM GOAL #2   Title  perform bathing/dressing I with minimal discomfort ( 12/18/17)     Time  12    Period  Weeks    Status  On-going      PT LONG TERM  GOAL #3   Title  demo Lt shoulder AROM WFL to allow her to reach a shelf overhead with good mechanics ( 12/18/17)     Time  12    Period  Weeks    Status  On-going      PT LONG TERM GOAL #4   Title  demo Lt shoulder strength =/> 4+/5 to assist with daily housework ( 12/18/17)     Time  12    Period  Weeks    Status  On-going      PT LONG TERM GOAL #5   Title  Improve FOTO =/< 48% limited ( 12/18/17)     Time  12    Period  Weeks    Status  Achieved            Plan - 10/27/17 1147    Clinical Impression Statement  Pt has met her STG and LTG for FOTO with 37% limitation.  ROM and strength in Lt shoulder gradually improving.      Rehab Potential  Good    PT Frequency  2x / week    PT Duration  12 weeks    PT Treatment/Interventions  Dry needling;Manual techniques;Moist Heat;Ultrasound;Therapeutic activities;Patient/family education;Taping;Vasopneumatic Device;Therapeutic exercise;Cryotherapy;Electrical Stimulation;Passive range of motion;Scar mobilization    PT Next Visit Plan  continue progressive strengthening Lt shoulder per rehab protocol.     Consulted and Agree with Plan of Care  Patient       Patient will benefit from skilled therapeutic intervention in order to improve the following deficits and impairments:  Pain, Improper body mechanics, Postural dysfunction, Increased muscle spasms, Decreased scar mobility, Decreased range of motion, Decreased strength, Impaired UE functional use, Obesity, Increased edema  Visit Diagnosis: Stiffness of left shoulder, not elsewhere classified  Acute pain of left shoulder  Muscle weakness (generalized)     Problem List Patient Active Problem List   Diagnosis Date Noted  . Furuncle of vulva 12/06/2016  . Bilateral carpal tunnel syndrome 11/23/2016  . Neck pain 11/23/2016  . Primary osteoarthritis of both first carpometacarpal joints 11/23/2016  . Diabetic nephropathy ([place]) 08/18/2016  . Right knee pain 01/03/2016  .  Asymmetrical hearing loss of both ears 02/27/2015  . Pressure ulcer 11/10/2014  . Spondylolisthesis of lumbar region 11/07/2014  . CKD (chronic kidney disease), stage III ([place]) 07/24/2013  . Chest pain 07/23/2013  . Chronic pain 07/23/2013  . Fibromyalgia 07/23/2013  . Leukocytosis 02/07/2013  . Weakness generalized 02/07/2013  . Acute encephalopathy 02/05/2013  . Anemia 02/05/2013  . Acute renal failure ([place]) 02/05/2013  . Dehydration 09/12/2012  . Diabetes mellitus ([place]) 09/12/2012  . Hypertension 09/12/2012  . Hypothyroidism 09/12/2012  . Hyperlipidemia 09/12/2012   Kerin Perna, PTA 10/27/17 12:27 PM  [place], [place], [address] Phone: [phone]   Fax:  [phone]  Name: SHONTA BOURQUE MRN: [id number] Date of Birth:  05/06/1941   

## 2017-10-30 ENCOUNTER — Encounter: Payer: Medicare Other | Admitting: Physical Therapy

## 2017-10-30 DIAGNOSIS — R2241 Localized swelling, mass and lump, right lower limb: Secondary | ICD-10-CM | POA: Diagnosis not present

## 2017-10-31 DIAGNOSIS — N3020 Other chronic cystitis without hematuria: Secondary | ICD-10-CM | POA: Diagnosis not present

## 2017-11-01 ENCOUNTER — Encounter: Payer: Self-pay | Admitting: Physical Therapy

## 2017-11-01 ENCOUNTER — Ambulatory Visit (INDEPENDENT_AMBULATORY_CARE_PROVIDER_SITE_OTHER): Payer: Medicare Other | Admitting: Physical Therapy

## 2017-11-01 DIAGNOSIS — M25512 Pain in left shoulder: Secondary | ICD-10-CM | POA: Diagnosis not present

## 2017-11-01 DIAGNOSIS — M25612 Stiffness of left shoulder, not elsewhere classified: Secondary | ICD-10-CM

## 2017-11-01 DIAGNOSIS — M6281 Muscle weakness (generalized): Secondary | ICD-10-CM | POA: Diagnosis not present

## 2017-11-01 DIAGNOSIS — M62838 Other muscle spasm: Secondary | ICD-10-CM

## 2017-11-01 NOTE — Therapy (Addendum)
Progress Note Reporting Period 09/25/17 to 11/01/17  See note below for Objective Data and Assessment of Progress/Goals.       [place], [place], [address] Phone: [phone]   Fax:  [phone]  Physical Therapy Treatment  Patient Details  Name: Abigail [name] MRN: [id number] Date of Birth: [birth year] Referring Provider: Dr. Jasper Loser   Encounter Date: 11/01/2017  PT End of Session - 11/01/17 1109    Visit Number  10    Number of Visits  24    Date for PT Re-Evaluation  12/18/17    PT Start Time  1108    PT Stop Time  1157 heat at end    PT Time Calculation (min)  49 min    Activity Tolerance  Patient tolerated treatment well       Past Medical History:  Diagnosis Date  . Boils    near vaginal area  . Chronic kidney disease    stage 3; low kidney function; BUN was high acc to pt  . Constipation due to pain medication   . Depression   . Diabetes mellitus    Type 2  . Family hx of colon cancer   . Fibromyalgia   . Frequency of urination   . Frequent urination at night   . GERD (gastroesophageal reflux disease)    pt denies  . Heart murmur   . History of bladder infections   . Hyperlipidemia   . Hypertension   . Hypothyroidism   . Itchy eyes   . Obesity   . Obesity   . PONV (postoperative nausea and vomiting)    with stapedectomy  . Torn rotator cuff left   current    Past Surgical History:  Procedure Laterality Date  . BACK SURGERY     lumbar fusion  . CARPAL TUNNEL RELEASE Right 06/29/2017   Procedure: RIGHT CARPAL TUNNEL RELEASE;  Surgeon: Daryll Brod, MD;  Location: [facility];  Service: Orthopedics;  Laterality: Right;  . CHOLECYSTECTOMY  1971  . COLONOSCOPY    . DILATION AND CURETTAGE OF UTERUS    . MAXIMUM ACCESS (MAS)POSTERIOR LUMBAR INTERBODY FUSION (PLIF) 1 LEVEL N/A 11/07/2014   Procedure: Lumbar Four-Five Maximum access posterior lumbar  interbody fusion;  Surgeon: Erline Levine, MD;  Location: [facility];  Service: Neurosurgery;  Laterality: N/A;  L4-5 Maximum access posterior lumbar interbody fusion  . SHOULDER SURGERY     bilateral rotator cuff repairs  . STAPEDECTOMY Left    ear  . TUBAL LIGATION  1971    There were no vitals filed for this visit.  Subjective Assessment - 11/01/17 1110    Subjective  Ashlee still having all over pain from her back, she is going to have another injection to try and help with this.  Lt shoulder is not bothering her.     Pertinent History  called MD ok to do more aggresiive exercise. however don't push passive extension and ER     Patient Stated Goals  brush her hair and fix it.     Currently in Pain?  -- no pain in her shoulder today         [facility] PT Assessment - 11/01/17 0001      Assessment   Medical Diagnosis  Lt reverse shoulder      AROM   AROM Assessment Site  Shoulder    Right/Left Shoulder  Left in supine    Left  Shoulder Flexion  154 Degrees    Left Shoulder ABduction  128 Degrees    Left Shoulder External Rotation  42 Degrees                   [facility] Adult PT Treatment/Exercise - 11/01/17 0001      Shoulder Exercises: Seated   Other Seated Exercises  10 reps each with 2#, supinated bicep curls, hammer curls, then endrange curls and bottom curls       Shoulder Exercises: Prone   Other Prone Exercises  20 reps scapular retraction, 2# arm by side and off EOB       Shoulder Exercises: Sidelying   External Rotation  Strengthening;Left;Weights 3x8, with towel under elbow, cues for form    External Rotation Weight (lbs)  1    Other Sidelying Exercises  3x8 empty can with 1#       Shoulder Exercises: Standing   Flexion  Strengthening;Both;20 reps;Weights FWD press and flexion to 90    Shoulder Flexion Weight (lbs)  1 with scapular squeeze around noodle    Other Standing Exercises  3x10 mini wall push ups, hands below shoulders      Shoulder Exercises:  ROM/Strengthening   Nustep  arms only L4x5'.       Moist Heat Therapy   Number Minutes Moist Heat  15 Minutes    Moist Heat Location  Lumbar Spine;Knee;Cervical      Cryotherapy   Number Minutes Cryotherapy  15 Minutes    Cryotherapy Location  Shoulder    Type of Cryotherapy  Ice pack               PT Short Term Goals - 10/27/17 1223      PT SHORT TERM GOAL #1   Title  I with initial HEP for shoulder per protocol ( 11/06/17)     Time  6    Period  Weeks    Status  Achieved      PT SHORT TERM GOAL #2   Title  demo Lt shoulder PROM WFL in supine position with minimal pain - per protocol ( 11/06/17)     Time  6    Period  Weeks    Status  Achieved      PT SHORT TERM GOAL #3   Title  tolerate initial scapular stability ex without difficulty ( 11/06/17)     Time  6    Period  Weeks    Status  Achieved      PT SHORT TERM GOAL #4   Title  report no more than 2/10 pain from muscle spasms in the Lt shoulder/neck ( 11/06/17)     Time  6    Period  Weeks      PT SHORT TERM GOAL #5   Title  improve FOTO =/< 65% limited ( 11/06/17)     Time  6    Period  Weeks    Status  Achieved        PT Long Term Goals - 10/27/17 1224      PT LONG TERM GOAL #1   Title  I with advanced HEP for shoulder ( 12/18/17)     Time  12    Period  Weeks    Status  On-going      PT LONG TERM GOAL #2   Title  perform bathing/dressing I with minimal discomfort ( 12/18/17)     Time  12    Period  Weeks  Status  On-going      PT LONG TERM GOAL #3   Title  demo Lt shoulder AROM WFL to allow her to reach a shelf overhead with good mechanics ( 12/18/17)     Time  12    Period  Weeks    Status  On-going      PT LONG TERM GOAL #4   Title  demo Lt shoulder strength =/> 4+/5 to assist with daily housework ( 12/18/17)     Time  12    Period  Weeks    Status  On-going      PT LONG TERM GOAL #5   Title  Improve FOTO =/< 48% limited ( 12/18/17)     Time  12    Period  Weeks    Status   Achieved            Plan - 11/01/17 1145    Clinical Impression Statement  Betties shoulder ROM is improving, pain has been very limited in the shoulder complex.  Her UE strength is gradually improving as well.  Decreased compensatory motion in her Lt scapula with elevation.     Rehab Potential  Good    PT Frequency  2x / week    PT Duration  12 weeks    PT Treatment/Interventions  Dry needling;Manual techniques;Moist Heat;Ultrasound;Therapeutic activities;Patient/family education;Taping;Vasopneumatic Device;Therapeutic exercise;Cryotherapy;Electrical Stimulation;Passive range of motion;Scar mobilization    PT Next Visit Plan  continue progressive strengthening Lt shoulder per rehab protocol.     Consulted and Agree with Plan of Care  Patient       Patient will benefit from skilled therapeutic intervention in order to improve the following deficits and impairments:  Pain, Improper body mechanics, Postural dysfunction, Increased muscle spasms, Decreased scar mobility, Decreased range of motion, Decreased strength, Impaired UE functional use, Obesity, Increased edema  Visit Diagnosis: Stiffness of left shoulder, not elsewhere classified  Acute pain of left shoulder  Muscle weakness (generalized)  Other muscle spasm     Problem List Patient Active Problem List   Diagnosis Date Noted  . Furuncle of vulva 12/06/2016  . Bilateral carpal tunnel syndrome 11/23/2016  . Neck pain 11/23/2016  . Primary osteoarthritis of both first carpometacarpal joints 11/23/2016  . Diabetic nephropathy ([place]) 08/18/2016  . Right knee pain 01/03/2016  . Asymmetrical hearing loss of both ears 02/27/2015  . Pressure ulcer 11/10/2014  . Spondylolisthesis of lumbar region 11/07/2014  . CKD (chronic kidney disease), stage III ([place]) 07/24/2013  . Chest pain 07/23/2013  . Chronic pain 07/23/2013  . Fibromyalgia 07/23/2013  . Leukocytosis 02/07/2013  . Weakness generalized 02/07/2013  . Acute  encephalopathy 02/05/2013  . Anemia 02/05/2013  . Acute renal failure ([place]) 02/05/2013  . Dehydration 09/12/2012  . Diabetes mellitus ([place]) 09/12/2012  . Hypertension 09/12/2012  . Hypothyroidism 09/12/2012  . Hyperlipidemia 09/12/2012    Jeral Pinch PT  11/01/2017, 11:49 AM  [facility] [place], [place], [address] Phone: [phone]   Fax:  [phone]  Name: ORNELLA CODERRE MRN: [id number] Date of Birth: 1940/06/23

## 2017-11-03 DIAGNOSIS — R300 Dysuria: Secondary | ICD-10-CM | POA: Diagnosis not present

## 2017-11-06 ENCOUNTER — Ambulatory Visit (INDEPENDENT_AMBULATORY_CARE_PROVIDER_SITE_OTHER): Payer: Medicare Other | Admitting: Physical Therapy

## 2017-11-06 DIAGNOSIS — M6281 Muscle weakness (generalized): Secondary | ICD-10-CM | POA: Diagnosis not present

## 2017-11-06 DIAGNOSIS — M25512 Pain in left shoulder: Secondary | ICD-10-CM

## 2017-11-06 DIAGNOSIS — M25612 Stiffness of left shoulder, not elsewhere classified: Secondary | ICD-10-CM

## 2017-11-06 NOTE — Therapy (Signed)
[place], [place], [address] Phone: [phone]   Fax:  [phone]  Physical Therapy Treatment  Patient Details  Name: Abigail [name] MRN: [id number] Date of Birth: 10-23-1940 Referring Provider: Dr. Jasper Loser   Encounter Date: 11/06/2017  PT End of Session - 11/06/17 1155    Visit Number  11    Number of Visits  24    Date for PT Re-Evaluation  12/18/17    PT Start Time  1150    PT Stop Time  1238 MHP last 15 min     PT Time Calculation (min)  48 min    Activity Tolerance  Patient tolerated treatment well    Behavior During Therapy  [facility] for tasks assessed/performed       Past Medical History:  Diagnosis Date  . Boils    near vaginal area  . Chronic kidney disease    stage 3; low kidney function; BUN was high acc to pt  . Constipation due to pain medication   . Depression   . Diabetes mellitus    Type 2  . Family hx of colon cancer   . Fibromyalgia   . Frequency of urination   . Frequent urination at night   . GERD (gastroesophageal reflux disease)    pt denies  . Heart murmur   . History of bladder infections   . Hyperlipidemia   . Hypertension   . Hypothyroidism   . Itchy eyes   . Obesity   . Obesity   . PONV (postoperative nausea and vomiting)    with stapedectomy  . Torn rotator cuff left   current    Past Surgical History:  Procedure Laterality Date  . BACK SURGERY     lumbar fusion  . CARPAL TUNNEL RELEASE Right 06/29/2017   Procedure: RIGHT CARPAL TUNNEL RELEASE;  Surgeon: Daryll Brod, MD;  Location: [facility];  Service: Orthopedics;  Laterality: Right;  . CHOLECYSTECTOMY  1971  . COLONOSCOPY    . DILATION AND CURETTAGE OF UTERUS    . MAXIMUM ACCESS (MAS)POSTERIOR LUMBAR INTERBODY FUSION (PLIF) 1 LEVEL N/A 11/07/2014   Procedure: Lumbar Four-Five Maximum access posterior lumbar interbody fusion;  Surgeon: Erline Levine, MD;  Location: [facility];  Service: Neurosurgery;  Laterality: N/A;  L4-5 Maximum access posterior lumbar interbody fusion  . SHOULDER SURGERY     bilateral rotator cuff repairs  . STAPEDECTOMY Left    ear  . TUBAL LIGATION  1971    There were no vitals filed for this visit.  Subjective Assessment - 11/06/17 1155    Subjective  Pt reports she has been doing some of her exercises at home. She notices some pain in her Lt deltoid after doing the exercises, or after driving.  She is having an injection in her back tomorrow, and is having a ramp installed in her garage to enter her house.     Pertinent History  called MD ok to do more aggresiive exercise. however don't push passive extension and ER     Patient Stated Goals  brush her hair and fix it.     Currently in Pain?  Yes    Pain Score  1     Pain Location  Shoulder    Pain Orientation  Left    Pain Descriptors / Indicators  Sore    Aggravating Factors   lifting, reaching    Pain Relieving Factors  rest, and ice.  [facility] PT Assessment - 11/06/17 0001      Assessment   Medical Diagnosis  Lt reverse shoulder    Referring Provider  Dr. Jasper Loser    Onset Date/Surgical Date  08/24/17    Hand Dominance  Right    Next MD Visit  11/20/17      AROM   Left Shoulder Flexion  120 Degrees    Left Shoulder ABduction  112 Degrees      [facility] Adult PT Treatment/Exercise - 11/06/17 0001      Shoulder Exercises: Supine   Horizontal ABduction  Strengthening;Both;10 reps;Theraband    Theraband Level (Shoulder Horizontal ABduction)  Level 1 (Yellow)    External Rotation  Strengthening;Left;10 reps;Theraband 3 sets, cues for form.     Theraband Level (Shoulder External Rotation)  Level 1 (Yellow)    Internal Rotation  --    Flexion  Strengthening;Both;10 reps;Theraband overhead pull with band    Theraband Level (Shoulder Flexion)  Level 1 (Yellow)    Other Supine Exercises  sash with yellow band with LUE x 10      Shoulder Exercises: Seated    Other Seated Exercises  bilat elbow flexion x 10 reps, 2sets with 2#, another set with 3#.      Shoulder Exercises: Standing   Flexion  Strengthening;Left;5 reps;Weights waist to shoulder height shelf, 5 reps each wt.     Shoulder Flexion Weight (lbs)  2, 1    Other Standing Exercises  10 mini wall push ups x 10    Other Standing Exercises  Lt shoulder flexion wall ladder x 2 reps flexion, 2 reps scaption (up to #25), repeated cues to avoid leaning      Shoulder Exercises: ROM/Strengthening   Nustep  arms only L3x5'.       Moist Heat Therapy   Number Minutes Moist Heat  15 Minutes    Moist Heat Location  Lumbar Spine;Knee;Cervical      Cryotherapy   Number Minutes Cryotherapy  15 Minutes    Cryotherapy Location  Shoulder    Type of Cryotherapy  Ice pack             PT Education - 11/06/17 1241    Education provided  Yes    Education Details  HEP    Person(s) Educated  Patient    Methods  Explanation;Handout;Verbal cues;Tactile cues;Demonstration    Comprehension  Verbalized understanding;Returned demonstration       PT Short Term Goals - 10/27/17 1223      PT SHORT TERM GOAL #1   Title  I with initial HEP for shoulder per protocol ( 11/06/17)     Time  6    Period  Weeks    Status  Achieved      PT SHORT TERM GOAL #2   Title  demo Lt shoulder PROM WFL in supine position with minimal pain - per protocol ( 11/06/17)     Time  6    Period  Weeks    Status  Achieved      PT SHORT TERM GOAL #3   Title  tolerate initial scapular stability ex without difficulty ( 11/06/17)     Time  6    Period  Weeks    Status  Achieved      PT SHORT TERM GOAL #4   Title  report no more than 2/10 pain from muscle spasms in the Lt shoulder/neck ( 11/06/17)     Time  6    Period  Weeks      PT SHORT TERM GOAL #5   Title  improve FOTO =/< 65% limited ( 11/06/17)     Time  6    Period  Weeks    Status  Achieved        PT Long Term Goals - 10/27/17 1224      PT LONG TERM GOAL  #1   Title  I with advanced HEP for shoulder ( 12/18/17)     Time  12    Period  Weeks    Status  On-going      PT LONG TERM GOAL #2   Title  perform bathing/dressing I with minimal discomfort ( 12/18/17)     Time  12    Period  Weeks    Status  On-going      PT LONG TERM GOAL #3   Title  demo Lt shoulder AROM WFL to allow her to reach a shelf overhead with good mechanics ( 12/18/17)     Time  12    Period  Weeks    Status  On-going      PT LONG TERM GOAL #4   Title  demo Lt shoulder strength =/> 4+/5 to assist with daily housework ( 12/18/17)     Time  12    Period  Weeks    Status  On-going      PT LONG TERM GOAL #5   Title  Improve FOTO =/< 48% limited ( 12/18/17)     Time  12    Period  Weeks    Status  Achieved            Plan - 11/06/17 1255    Clinical Impression Statement  Pt tolerated supine scap stabilization exercises well, with minimal increase in pain. Functional mobility in Lt shoulder gradually improving.  Minor cues to correct compensatory motions.      Rehab Potential  Good    PT Frequency  2x / week    PT Duration  12 weeks    PT Treatment/Interventions  Dry needling;Manual techniques;Moist Heat;Ultrasound;Therapeutic activities;Patient/family education;Taping;Vasopneumatic Device;Therapeutic exercise;Cryotherapy;Electrical Stimulation;Passive range of motion;Scar mobilization    PT Next Visit Plan  continue progressive strengthening Lt shoulder per rehab protocol.     Consulted and Agree with Plan of Care  Patient       Patient will benefit from skilled therapeutic intervention in order to improve the following deficits and impairments:  Pain, Improper body mechanics, Postural dysfunction, Increased muscle spasms, Decreased scar mobility, Decreased range of motion, Decreased strength, Impaired UE functional use, Obesity, Increased edema  Visit Diagnosis: Stiffness of left shoulder, not elsewhere classified  Acute pain of left shoulder  Muscle  weakness (generalized)     Problem List Patient Active Problem List   Diagnosis Date Noted  . Furuncle of vulva 12/06/2016  . Bilateral carpal tunnel syndrome 11/23/2016  . Neck pain 11/23/2016  . Primary osteoarthritis of both first carpometacarpal joints 11/23/2016  . Diabetic nephropathy ([place]) 08/18/2016  . Right knee pain 01/03/2016  . Asymmetrical hearing loss of both ears 02/27/2015  . Pressure ulcer 11/10/2014  . Spondylolisthesis of lumbar region 11/07/2014  . CKD (chronic kidney disease), stage III ([place]) 07/24/2013  . Chest pain 07/23/2013  . Chronic pain 07/23/2013  . Fibromyalgia 07/23/2013  . Leukocytosis 02/07/2013  . Weakness generalized 02/07/2013  . Acute encephalopathy 02/05/2013  . Anemia 02/05/2013  . Acute renal failure ([place]) 02/05/2013  . Dehydration 09/12/2012  . Diabetes mellitus ([place])  09/12/2012  . Hypertension 09/12/2012  . Hypothyroidism 09/12/2012  . Hyperlipidemia 09/12/2012   Kerin Perna, PTA 11/06/17 12:58 PM  [place], [place], [address] Phone: [phone]   Fax:  [phone]  Name: Abigail [name] MRN: [id number] Date of Birth: 1940-12-07

## 2017-11-06 NOTE — Patient Instructions (Signed)
Over Head Pull: Narrow Grip     You can perform these exercises 3-4x per week.   2-3 sets per session.    On back, knees bent, feet flat, band across thighs, elbows straight but relaxed. Pull hands apart (start). Keeping elbows straight, bring arms up and over head, hands toward floor. Keep pull steady on band. Hold momentarily. Return slowly, keeping pull steady, back to start. Repeat _10__ times. Band color __yellow____   Side Pull: Double Arm   On back, knees bent, feet flat. Arms perpendicular to body, shoulder level, elbows straight but relaxed. Pull arms out to sides, elbows straight. Resistance band comes across collarbones, hands toward floor. Hold momentarily. Slowly return to starting position. Repeat _10__ times. Band color __yellow_   Sash   On back, knees bent, feet flat, left hand on left hip, right hand above left. Pull right arm DIAGONALLY (hip to shoulder) across chest. Bring right arm along head toward floor. Hold momentarily. Slowly return to starting position. Repeat _10__ times. Do with left arm. Band color _yellow_____   Shoulder Rotation: Double Arm   On back, knees bent, feet flat, elbows tucked at sides, bent elbows at 90, hands palms up.PROP LEFT ELBOW ON PILLOW.  Pull hands apart and down toward floor, keeping elbows near sides. Hold momentarily. Slowly return to starting position. Repeat _10__ times. Band color __yellow____

## 2017-11-07 DIAGNOSIS — M5417 Radiculopathy, lumbosacral region: Secondary | ICD-10-CM | POA: Diagnosis not present

## 2017-11-08 ENCOUNTER — Ambulatory Visit (INDEPENDENT_AMBULATORY_CARE_PROVIDER_SITE_OTHER): Payer: Medicare Other | Admitting: Physical Therapy

## 2017-11-08 ENCOUNTER — Encounter: Payer: Self-pay | Admitting: Physical Therapy

## 2017-11-08 DIAGNOSIS — M25512 Pain in left shoulder: Secondary | ICD-10-CM

## 2017-11-08 DIAGNOSIS — M25612 Stiffness of left shoulder, not elsewhere classified: Secondary | ICD-10-CM | POA: Diagnosis present

## 2017-11-08 DIAGNOSIS — M62838 Other muscle spasm: Secondary | ICD-10-CM

## 2017-11-08 DIAGNOSIS — M6281 Muscle weakness (generalized): Secondary | ICD-10-CM

## 2017-11-08 NOTE — Therapy (Addendum)
[place], [place], [address] Phone: [phone]   Fax:  [phone]  Physical Therapy Treatment  Patient Details  Name: Abigail [name] MRN: [id number] Date of Birth: 1940-10-28 Referring Provider: Dr. Jasper [name]   Encounter Date: 11/08/2017  [name] End of Session - 11/08/17 1106    Visit Number  12    Number of Visits  24    Date for [name] Re-Evaluation  12/18/17    [name] Start Time  1106    [name] Stop Time  1159    [name] Time Calculation (min)  53 min    Activity Tolerance  Patient tolerated treatment well       Past Medical History:  Diagnosis Date  . Boils    near vaginal area  . Chronic kidney disease    stage 3; low kidney function; BUN was high acc to [name]  . Constipation due to pain medication   . Depression   . Diabetes mellitus    Type 2  . Family hx of colon cancer   . Fibromyalgia   . Frequency of urination   . Frequent urination at night   . GERD (gastroesophageal reflux disease)    [name] denies  . Heart murmur   . History of bladder infections   . Hyperlipidemia   . Hypertension   . Hypothyroidism   . Itchy eyes   . Obesity   . Obesity   . PONV (postoperative nausea and vomiting)    with stapedectomy  . Torn rotator cuff left   current    Past Surgical History:  Procedure Laterality Date  . BACK SURGERY     lumbar fusion  . CARPAL TUNNEL RELEASE Right 06/29/2017   Procedure: RIGHT CARPAL TUNNEL RELEASE;  Surgeon: Daryll Brod, MD;  Location: [facility];  Service: Orthopedics;  Laterality: Right;  . CHOLECYSTECTOMY  1971  . COLONOSCOPY    . DILATION AND CURETTAGE OF UTERUS    . MAXIMUM ACCESS (MAS)POSTERIOR LUMBAR INTERBODY FUSION (PLIF) 1 LEVEL N/A 11/07/2014   Procedure: Lumbar Four-Five Maximum access posterior lumbar interbody fusion;  Surgeon: Erline Levine, MD;  Location: [facility];  Service: Neurosurgery;  Laterality: N/A;  L4-5 Maximum access posterior  lumbar interbody fusion  . SHOULDER SURGERY     bilateral rotator cuff repairs  . STAPEDECTOMY Left    ear  . TUBAL LIGATION  1971    There were no vitals filed for this visit.  Subjective Assessment - 11/08/17 1114    Subjective  [name] had injections yesterday 2 of them and she has some decreased pain since then.  The shoulder has some pain with use, today is good.  She is really hoping that the pain will settle down after these injections    Patient Stated Goals  brush her hair and fix it.     Currently in Pain?  No/denies         [facility] [name] Assessment - 11/08/17 0001      Assessment   Medical Diagnosis  Lt reverse shoulder      AROM   Right/Left Shoulder  Left    Left Shoulder Flexion  132 Degrees                   [facility] Adult [name] Treatment/Exercise - 11/08/17 0001      Shoulder Exercises: Seated   Other Seated Exercises  biceps, 3#- decreased to 2# due to poor form, regular, wide  and top half.        Shoulder Exercises: Standing   Horizontal ABduction  Strengthening;Left;Theraband to neutral 30 reps    Theraband Level (Shoulder Horizontal ABduction)  Level 2 (Red) physical cues for form    Flexion  Strengthening;Both;Weights 3x10 leaning against pool noodle, VC for form    Row  Strengthening;Left;Theraband hand to thigh arm straight    Theraband Level (Shoulder Row)  Level 2 (Red)    Other Standing Exercises  shoulder arc, moving rings side to side with 1# wt on wrist Lt       Shoulder Exercises: Pulleys   Flexion  2 minutes      Shoulder Exercises: ROM/Strengthening   Nustep  arms only L3x5'.       Modalities   Modalities  Moist Heat      Moist Heat Therapy   Number Minutes Moist Heat  15 Minutes    Moist Heat Location  -- bilat knees, Lt shoulder and neck               [name] Short Term Goals - 10/27/17 1223      [name] SHORT TERM GOAL #1   Title  I with initial HEP for shoulder per protocol ( 11/06/17)     Time  6    Period  Weeks    Status   Achieved      [name] SHORT TERM GOAL #2   Title  demo Lt shoulder PROM WFL in supine position with minimal pain - per protocol ( 11/06/17)     Time  6    Period  Weeks    Status  Achieved      [name] SHORT TERM GOAL #3   Title  tolerate initial scapular stability ex without difficulty ( 11/06/17)     Time  6    Period  Weeks    Status  Achieved      [name] SHORT TERM GOAL #4   Title  report no more than 2/10 pain from muscle spasms in the Lt shoulder/neck ( 11/06/17)     Time  6    Period  Weeks      [name] SHORT TERM GOAL #5   Title  improve FOTO =/< 65% limited ( 11/06/17)     Time  6    Period  Weeks    Status  Achieved        [name] Long Term Goals - 11/08/17 1118      [name] LONG TERM GOAL #1   Title  I with advanced HEP for shoulder ( 12/18/17)     Status  On-going      [name] LONG TERM GOAL #2   Title  perform bathing/dressing I with minimal discomfort ( 12/18/17)     Status  Achieved able to perform all without difficulty now even washing her hair      [name] LONG TERM GOAL #3   Title  demo Lt shoulder AROM WFL to allow her to reach a shelf overhead with good mechanics ( 12/18/17)             Plan - 11/08/17 1146    Clinical Impression Statement  Abigail [name] is slowly getting stronger, uses compensatory shoulder motion when she fatigues.  Her ROM is functional, no extension has been performed yet per protocol. [name] reports she is bathing and dressing without difficult, met that goal.  Progressing to the others.  She may not need her whole POC if she continues to do  well.     Rehab Potential  Good    [name] Frequency  2x / week    [name] Duration  12 weeks    [name] Treatment/Interventions  Dry needling;Manual techniques;Moist Heat;Ultrasound;Therapeutic activities;Patient/family education;Taping;Vasopneumatic Device;Therapeutic exercise;Cryotherapy;Electrical Stimulation;Passive range of motion;Scar mobilization    [name] Next Visit Plan  continue progressive strengthening Lt shoulder per rehab protocol.      Consulted and Agree with Plan of Care  Patient       Patient will benefit from skilled therapeutic intervention in order to improve the following deficits and impairments:  Pain, Improper body mechanics, Postural dysfunction, Increased muscle spasms, Decreased scar mobility, Decreased range of motion, Decreased strength, Impaired UE functional use, Obesity, Increased edema  Visit Diagnosis: Stiffness of left shoulder, not elsewhere classified  Acute pain of left shoulder  Muscle weakness (generalized)  Other muscle spasm     Problem List Patient Active Problem List   Diagnosis Date Noted  . Furuncle of vulva 12/06/2016  . Bilateral carpal tunnel syndrome 11/23/2016  . Neck pain 11/23/2016  . Primary osteoarthritis of both first carpometacarpal joints 11/23/2016  . Diabetic nephropathy ([place]) 08/18/2016  . Right knee pain 01/03/2016  . Asymmetrical hearing loss of both ears 02/27/2015  . Pressure ulcer 11/10/2014  . Spondylolisthesis of lumbar region 11/07/2014  . CKD (chronic kidney disease), stage III ([place]) 07/24/2013  . Chest pain 07/23/2013  . Chronic pain 07/23/2013  . Fibromyalgia 07/23/2013  . Leukocytosis 02/07/2013  . Weakness generalized 02/07/2013  . Acute encephalopathy 02/05/2013  . Anemia 02/05/2013  . Acute renal failure ([place]) 02/05/2013  . Dehydration 09/12/2012  . Diabetes mellitus ([place]) 09/12/2012  . Hypertension 09/12/2012  . Hypothyroidism 09/12/2012  . Hyperlipidemia 09/12/2012    Abigail [name]  11/08/2017, 11:48 AM  [facility] [place], [place], [address] Phone: [phone]   Fax:  [phone]  Name: Abigail [name] MRN: [id number] Date of Birth: June 08, 1940   PHYSICAL THERAPY DISCHARGE SUMMARY  Visits from Start of Care: 12  Current functional level related to goals / functional outcomes: Unknown for now, see above for function at last visit  Remaining  deficits: unknown   Education / Equipment: HEP and protocol progression Plan: Patient agrees to discharge.  Patient goals were partially met. Patient is being discharged due to the physician's request.Per patient she was told she didn't need to return and could continue with her HEP   ?????    Abigail Pinch, [name] 01/02/18 8:36 AM

## 2017-11-13 ENCOUNTER — Encounter: Payer: Medicare Other | Admitting: Physical Therapy

## 2017-11-15 ENCOUNTER — Encounter: Payer: Medicare Other | Admitting: Physical Therapy

## 2017-11-21 DIAGNOSIS — N3020 Other chronic cystitis without hematuria: Secondary | ICD-10-CM | POA: Diagnosis not present

## 2017-11-21 DIAGNOSIS — E782 Mixed hyperlipidemia: Secondary | ICD-10-CM | POA: Diagnosis not present

## 2017-11-21 DIAGNOSIS — F411 Generalized anxiety disorder: Secondary | ICD-10-CM | POA: Diagnosis not present

## 2017-11-21 DIAGNOSIS — N390 Urinary tract infection, site not specified: Secondary | ICD-10-CM | POA: Diagnosis not present

## 2017-11-21 DIAGNOSIS — E1140 Type 2 diabetes mellitus with diabetic neuropathy, unspecified: Secondary | ICD-10-CM | POA: Diagnosis not present

## 2017-11-21 DIAGNOSIS — D510 Vitamin B12 deficiency anemia due to intrinsic factor deficiency: Secondary | ICD-10-CM | POA: Diagnosis not present

## 2017-11-21 DIAGNOSIS — E538 Deficiency of other specified B group vitamins: Secondary | ICD-10-CM | POA: Diagnosis not present

## 2017-11-21 DIAGNOSIS — N183 Chronic kidney disease, stage 3 (moderate): Secondary | ICD-10-CM | POA: Diagnosis not present

## 2017-11-21 DIAGNOSIS — E559 Vitamin D deficiency, unspecified: Secondary | ICD-10-CM | POA: Diagnosis not present

## 2017-11-21 DIAGNOSIS — E039 Hypothyroidism, unspecified: Secondary | ICD-10-CM | POA: Diagnosis not present

## 2017-11-21 DIAGNOSIS — Z0000 Encounter for general adult medical examination without abnormal findings: Secondary | ICD-10-CM | POA: Diagnosis not present

## 2017-11-21 DIAGNOSIS — M179 Osteoarthritis of knee, unspecified: Secondary | ICD-10-CM | POA: Diagnosis not present

## 2017-11-21 DIAGNOSIS — M1990 Unspecified osteoarthritis, unspecified site: Secondary | ICD-10-CM | POA: Diagnosis not present

## 2017-11-21 DIAGNOSIS — I10 Essential (primary) hypertension: Secondary | ICD-10-CM | POA: Diagnosis not present

## 2017-11-21 DIAGNOSIS — M549 Dorsalgia, unspecified: Secondary | ICD-10-CM | POA: Diagnosis not present

## 2017-11-27 DIAGNOSIS — M545 Low back pain: Secondary | ICD-10-CM | POA: Diagnosis not present

## 2017-11-27 DIAGNOSIS — M4317 Spondylolisthesis, lumbosacral region: Secondary | ICD-10-CM | POA: Diagnosis not present

## 2017-11-27 DIAGNOSIS — Z6837 Body mass index (BMI) 37.0-37.9, adult: Secondary | ICD-10-CM | POA: Diagnosis not present

## 2017-11-27 DIAGNOSIS — M5417 Radiculopathy, lumbosacral region: Secondary | ICD-10-CM | POA: Diagnosis not present

## 2017-11-27 DIAGNOSIS — R030 Elevated blood-pressure reading, without diagnosis of hypertension: Secondary | ICD-10-CM | POA: Diagnosis not present

## 2017-12-13 DIAGNOSIS — Z113 Encounter for screening for infections with a predominantly sexual mode of transmission: Secondary | ICD-10-CM | POA: Diagnosis not present

## 2017-12-13 DIAGNOSIS — Z124 Encounter for screening for malignant neoplasm of cervix: Secondary | ICD-10-CM | POA: Diagnosis not present

## 2017-12-13 DIAGNOSIS — N760 Acute vaginitis: Secondary | ICD-10-CM | POA: Diagnosis not present

## 2017-12-13 DIAGNOSIS — Z1231 Encounter for screening mammogram for malignant neoplasm of breast: Secondary | ICD-10-CM | POA: Diagnosis not present

## 2017-12-29 DIAGNOSIS — M180 Bilateral primary osteoarthritis of first carpometacarpal joints: Secondary | ICD-10-CM | POA: Diagnosis not present

## 2018-01-03 DIAGNOSIS — L430 Hypertrophic lichen planus: Secondary | ICD-10-CM | POA: Diagnosis not present

## 2018-01-03 DIAGNOSIS — D485 Neoplasm of uncertain behavior of skin: Secondary | ICD-10-CM | POA: Diagnosis not present

## 2018-01-04 DIAGNOSIS — N819 Female genital prolapse, unspecified: Secondary | ICD-10-CM | POA: Diagnosis not present

## 2018-01-10 DIAGNOSIS — R2241 Localized swelling, mass and lump, right lower limb: Secondary | ICD-10-CM | POA: Diagnosis not present

## 2018-01-11 DIAGNOSIS — L0889 Other specified local infections of the skin and subcutaneous tissue: Secondary | ICD-10-CM | POA: Diagnosis not present

## 2018-01-11 DIAGNOSIS — R233 Spontaneous ecchymoses: Secondary | ICD-10-CM | POA: Diagnosis not present

## 2018-01-25 DIAGNOSIS — R233 Spontaneous ecchymoses: Secondary | ICD-10-CM | POA: Diagnosis not present

## 2018-01-25 DIAGNOSIS — L821 Other seborrheic keratosis: Secondary | ICD-10-CM | POA: Diagnosis not present

## 2018-02-08 DIAGNOSIS — Z23 Encounter for immunization: Secondary | ICD-10-CM | POA: Diagnosis not present

## 2018-02-23 DIAGNOSIS — R079 Chest pain, unspecified: Secondary | ICD-10-CM | POA: Diagnosis not present

## 2018-02-23 DIAGNOSIS — R5382 Chronic fatigue, unspecified: Secondary | ICD-10-CM | POA: Diagnosis not present

## 2018-03-01 DIAGNOSIS — M25461 Effusion, right knee: Secondary | ICD-10-CM | POA: Diagnosis not present

## 2018-03-01 DIAGNOSIS — R2241 Localized swelling, mass and lump, right lower limb: Secondary | ICD-10-CM | POA: Diagnosis not present

## 2018-03-01 DIAGNOSIS — R600 Localized edema: Secondary | ICD-10-CM | POA: Diagnosis not present

## 2018-03-06 ENCOUNTER — Other Ambulatory Visit (HOSPITAL_BASED_OUTPATIENT_CLINIC_OR_DEPARTMENT_OTHER): Payer: Self-pay | Admitting: Family Medicine

## 2018-03-06 ENCOUNTER — Ambulatory Visit (HOSPITAL_BASED_OUTPATIENT_CLINIC_OR_DEPARTMENT_OTHER)
Admission: RE | Admit: 2018-03-06 | Discharge: 2018-03-06 | Disposition: A | Discharge status: Home / Self Care | Payer: Medicare Other | Source: Ambulatory Visit | Attending: Family Medicine | Admitting: Family Medicine

## 2018-03-06 DIAGNOSIS — R9389 Abnormal findings on diagnostic imaging of other specified body structures: Secondary | ICD-10-CM

## 2018-03-06 DIAGNOSIS — D699 Hemorrhagic condition, unspecified: Secondary | ICD-10-CM | POA: Diagnosis not present

## 2018-03-06 DIAGNOSIS — R2241 Localized swelling, mass and lump, right lower limb: Secondary | ICD-10-CM | POA: Diagnosis not present

## 2018-03-06 DIAGNOSIS — E1140 Type 2 diabetes mellitus with diabetic neuropathy, unspecified: Secondary | ICD-10-CM | POA: Diagnosis not present

## 2018-03-15 DIAGNOSIS — R2241 Localized swelling, mass and lump, right lower limb: Secondary | ICD-10-CM | POA: Diagnosis not present

## 2018-03-16 DIAGNOSIS — D1809 Hemangioma of other sites: Secondary | ICD-10-CM | POA: Diagnosis not present

## 2018-03-16 DIAGNOSIS — Z7951 Long term (current) use of inhaled steroids: Secondary | ICD-10-CM | POA: Diagnosis not present

## 2018-03-16 DIAGNOSIS — M1990 Unspecified osteoarthritis, unspecified site: Secondary | ICD-10-CM | POA: Diagnosis present

## 2018-03-16 DIAGNOSIS — R2241 Localized swelling, mass and lump, right lower limb: Secondary | ICD-10-CM | POA: Diagnosis not present

## 2018-03-16 DIAGNOSIS — D2121 Benign neoplasm of connective and other soft tissue of right lower limb, including hip: Secondary | ICD-10-CM | POA: Diagnosis present

## 2018-03-16 DIAGNOSIS — E1140 Type 2 diabetes mellitus with diabetic neuropathy, unspecified: Secondary | ICD-10-CM | POA: Diagnosis present

## 2018-03-16 DIAGNOSIS — Z7982 Long term (current) use of aspirin: Secondary | ICD-10-CM | POA: Diagnosis not present

## 2018-03-16 DIAGNOSIS — E039 Hypothyroidism, unspecified: Secondary | ICD-10-CM | POA: Diagnosis present

## 2018-03-16 DIAGNOSIS — E669 Obesity, unspecified: Secondary | ICD-10-CM | POA: Diagnosis present

## 2018-03-16 DIAGNOSIS — E785 Hyperlipidemia, unspecified: Secondary | ICD-10-CM | POA: Diagnosis present

## 2018-03-16 DIAGNOSIS — E1122 Type 2 diabetes mellitus with diabetic chronic kidney disease: Secondary | ICD-10-CM | POA: Diagnosis present

## 2018-03-16 DIAGNOSIS — G5741 Lesion of medial popliteal nerve, right lower limb: Secondary | ICD-10-CM | POA: Diagnosis not present

## 2018-03-16 DIAGNOSIS — G5781 Other specified mononeuropathies of right lower limb: Secondary | ICD-10-CM | POA: Diagnosis present

## 2018-03-16 DIAGNOSIS — Z794 Long term (current) use of insulin: Secondary | ICD-10-CM | POA: Diagnosis not present

## 2018-03-16 DIAGNOSIS — N182 Chronic kidney disease, stage 2 (mild): Secondary | ICD-10-CM | POA: Diagnosis present

## 2018-03-16 DIAGNOSIS — Z79899 Other long term (current) drug therapy: Secondary | ICD-10-CM | POA: Diagnosis not present

## 2018-03-16 DIAGNOSIS — I129 Hypertensive chronic kidney disease with stage 1 through stage 4 chronic kidney disease, or unspecified chronic kidney disease: Secondary | ICD-10-CM | POA: Diagnosis present

## 2018-03-16 DIAGNOSIS — M797 Fibromyalgia: Secondary | ICD-10-CM | POA: Diagnosis present

## 2018-03-16 DIAGNOSIS — Z6837 Body mass index (BMI) 37.0-37.9, adult: Secondary | ICD-10-CM | POA: Diagnosis not present

## 2018-03-26 DIAGNOSIS — R2241 Localized swelling, mass and lump, right lower limb: Secondary | ICD-10-CM | POA: Diagnosis not present

## 2018-04-02 DIAGNOSIS — R2241 Localized swelling, mass and lump, right lower limb: Secondary | ICD-10-CM | POA: Diagnosis not present

## 2018-04-04 DIAGNOSIS — M25512 Pain in left shoulder: Secondary | ICD-10-CM | POA: Diagnosis not present

## 2018-04-04 DIAGNOSIS — M25562 Pain in left knee: Secondary | ICD-10-CM | POA: Diagnosis not present

## 2018-04-18 DIAGNOSIS — R2241 Localized swelling, mass and lump, right lower limb: Secondary | ICD-10-CM | POA: Diagnosis not present

## 2018-05-03 DIAGNOSIS — E113293 Type 2 diabetes mellitus with mild nonproliferative diabetic retinopathy without macular edema, bilateral: Secondary | ICD-10-CM | POA: Diagnosis not present

## 2018-05-03 DIAGNOSIS — H2513 Age-related nuclear cataract, bilateral: Secondary | ICD-10-CM | POA: Diagnosis not present

## 2018-05-03 DIAGNOSIS — D3132 Benign neoplasm of left choroid: Secondary | ICD-10-CM | POA: Diagnosis not present

## 2018-05-03 DIAGNOSIS — H52203 Unspecified astigmatism, bilateral: Secondary | ICD-10-CM | POA: Diagnosis not present

## 2018-05-04 DIAGNOSIS — R2241 Localized swelling, mass and lump, right lower limb: Secondary | ICD-10-CM | POA: Diagnosis not present

## 2018-05-04 DIAGNOSIS — T8149XA Infection following a procedure, other surgical site, initial encounter: Secondary | ICD-10-CM | POA: Diagnosis not present

## 2018-05-09 DIAGNOSIS — M25562 Pain in left knee: Secondary | ICD-10-CM | POA: Diagnosis not present

## 2018-05-09 DIAGNOSIS — M1712 Unilateral primary osteoarthritis, left knee: Secondary | ICD-10-CM | POA: Diagnosis not present

## 2018-05-18 DIAGNOSIS — N951 Menopausal and female climacteric states: Secondary | ICD-10-CM | POA: Diagnosis not present

## 2018-05-18 DIAGNOSIS — N760 Acute vaginitis: Secondary | ICD-10-CM | POA: Diagnosis not present

## 2018-05-30 HISTORY — PX: EYE SURGERY: SHX253

## 2018-06-25 DIAGNOSIS — E1140 Type 2 diabetes mellitus with diabetic neuropathy, unspecified: Secondary | ICD-10-CM | POA: Diagnosis not present

## 2018-06-25 DIAGNOSIS — R609 Edema, unspecified: Secondary | ICD-10-CM | POA: Diagnosis not present

## 2018-06-25 DIAGNOSIS — F411 Generalized anxiety disorder: Secondary | ICD-10-CM | POA: Diagnosis not present

## 2018-07-10 DIAGNOSIS — N760 Acute vaginitis: Secondary | ICD-10-CM | POA: Diagnosis not present

## 2018-08-01 DIAGNOSIS — H0100B Unspecified blepharitis left eye, upper and lower eyelids: Secondary | ICD-10-CM | POA: Diagnosis not present

## 2018-08-01 DIAGNOSIS — H0100A Unspecified blepharitis right eye, upper and lower eyelids: Secondary | ICD-10-CM | POA: Diagnosis not present

## 2018-08-01 DIAGNOSIS — H353131 Nonexudative age-related macular degeneration, bilateral, early dry stage: Secondary | ICD-10-CM | POA: Diagnosis not present

## 2018-08-01 DIAGNOSIS — E113293 Type 2 diabetes mellitus with mild nonproliferative diabetic retinopathy without macular edema, bilateral: Secondary | ICD-10-CM | POA: Diagnosis not present

## 2018-08-01 DIAGNOSIS — H25813 Combined forms of age-related cataract, bilateral: Secondary | ICD-10-CM | POA: Diagnosis not present

## 2018-08-01 DIAGNOSIS — H527 Unspecified disorder of refraction: Secondary | ICD-10-CM | POA: Diagnosis not present

## 2018-08-01 DIAGNOSIS — H43811 Vitreous degeneration, right eye: Secondary | ICD-10-CM | POA: Diagnosis not present

## 2018-08-06 DIAGNOSIS — M25511 Pain in right shoulder: Secondary | ICD-10-CM | POA: Diagnosis not present

## 2018-09-04 DIAGNOSIS — M1990 Unspecified osteoarthritis, unspecified site: Secondary | ICD-10-CM | POA: Diagnosis not present

## 2018-09-04 DIAGNOSIS — N183 Chronic kidney disease, stage 3 (moderate): Secondary | ICD-10-CM | POA: Diagnosis not present

## 2018-09-04 DIAGNOSIS — D510 Vitamin B12 deficiency anemia due to intrinsic factor deficiency: Secondary | ICD-10-CM | POA: Diagnosis not present

## 2018-09-04 DIAGNOSIS — I10 Essential (primary) hypertension: Secondary | ICD-10-CM | POA: Diagnosis not present

## 2018-09-04 DIAGNOSIS — E039 Hypothyroidism, unspecified: Secondary | ICD-10-CM | POA: Diagnosis not present

## 2018-09-04 DIAGNOSIS — M179 Osteoarthritis of knee, unspecified: Secondary | ICD-10-CM | POA: Diagnosis not present

## 2018-09-04 DIAGNOSIS — E1140 Type 2 diabetes mellitus with diabetic neuropathy, unspecified: Secondary | ICD-10-CM | POA: Diagnosis not present

## 2018-09-04 DIAGNOSIS — E782 Mixed hyperlipidemia: Secondary | ICD-10-CM | POA: Diagnosis not present

## 2018-11-12 DIAGNOSIS — M25562 Pain in left knee: Secondary | ICD-10-CM | POA: Diagnosis not present

## 2018-11-12 DIAGNOSIS — M1712 Unilateral primary osteoarthritis, left knee: Secondary | ICD-10-CM | POA: Diagnosis not present

## 2018-11-20 DIAGNOSIS — H25813 Combined forms of age-related cataract, bilateral: Secondary | ICD-10-CM | POA: Diagnosis not present

## 2018-11-22 DIAGNOSIS — R300 Dysuria: Secondary | ICD-10-CM | POA: Diagnosis not present

## 2018-11-22 DIAGNOSIS — N3020 Other chronic cystitis without hematuria: Secondary | ICD-10-CM | POA: Diagnosis not present

## 2018-11-24 DIAGNOSIS — Z01818 Encounter for other preprocedural examination: Secondary | ICD-10-CM | POA: Diagnosis not present

## 2018-11-24 DIAGNOSIS — Z1159 Encounter for screening for other viral diseases: Secondary | ICD-10-CM | POA: Diagnosis not present

## 2018-11-24 DIAGNOSIS — E119 Type 2 diabetes mellitus without complications: Secondary | ICD-10-CM | POA: Diagnosis not present

## 2018-11-24 DIAGNOSIS — E039 Hypothyroidism, unspecified: Secondary | ICD-10-CM | POA: Diagnosis not present

## 2018-11-24 DIAGNOSIS — I10 Essential (primary) hypertension: Secondary | ICD-10-CM | POA: Diagnosis not present

## 2018-11-24 DIAGNOSIS — H25812 Combined forms of age-related cataract, left eye: Secondary | ICD-10-CM | POA: Diagnosis not present

## 2018-11-26 DIAGNOSIS — Z1389 Encounter for screening for other disorder: Secondary | ICD-10-CM | POA: Diagnosis not present

## 2018-11-26 DIAGNOSIS — E039 Hypothyroidism, unspecified: Secondary | ICD-10-CM | POA: Diagnosis not present

## 2018-11-26 DIAGNOSIS — I10 Essential (primary) hypertension: Secondary | ICD-10-CM | POA: Diagnosis not present

## 2018-11-26 DIAGNOSIS — Z0000 Encounter for general adult medical examination without abnormal findings: Secondary | ICD-10-CM | POA: Diagnosis not present

## 2018-11-26 DIAGNOSIS — E1140 Type 2 diabetes mellitus with diabetic neuropathy, unspecified: Secondary | ICD-10-CM | POA: Diagnosis not present

## 2018-11-26 DIAGNOSIS — F411 Generalized anxiety disorder: Secondary | ICD-10-CM | POA: Diagnosis not present

## 2018-11-26 DIAGNOSIS — N183 Chronic kidney disease, stage 3 (moderate): Secondary | ICD-10-CM | POA: Diagnosis not present

## 2018-11-26 DIAGNOSIS — D510 Vitamin B12 deficiency anemia due to intrinsic factor deficiency: Secondary | ICD-10-CM | POA: Diagnosis not present

## 2018-11-26 DIAGNOSIS — E538 Deficiency of other specified B group vitamins: Secondary | ICD-10-CM | POA: Diagnosis not present

## 2018-11-26 DIAGNOSIS — E559 Vitamin D deficiency, unspecified: Secondary | ICD-10-CM | POA: Diagnosis not present

## 2018-11-26 DIAGNOSIS — E782 Mixed hyperlipidemia: Secondary | ICD-10-CM | POA: Diagnosis not present

## 2018-11-27 DIAGNOSIS — H25813 Combined forms of age-related cataract, bilateral: Secondary | ICD-10-CM | POA: Diagnosis not present

## 2018-11-27 DIAGNOSIS — H0100A Unspecified blepharitis right eye, upper and lower eyelids: Secondary | ICD-10-CM | POA: Diagnosis not present

## 2018-11-27 DIAGNOSIS — H353110 Nonexudative age-related macular degeneration, right eye, stage unspecified: Secondary | ICD-10-CM | POA: Diagnosis not present

## 2018-11-27 DIAGNOSIS — H5319 Other subjective visual disturbances: Secondary | ICD-10-CM | POA: Diagnosis not present

## 2018-11-27 DIAGNOSIS — M797 Fibromyalgia: Secondary | ICD-10-CM | POA: Diagnosis not present

## 2018-11-27 DIAGNOSIS — E113293 Type 2 diabetes mellitus with mild nonproliferative diabetic retinopathy without macular edema, bilateral: Secondary | ICD-10-CM | POA: Diagnosis not present

## 2018-11-27 DIAGNOSIS — H25811 Combined forms of age-related cataract, right eye: Secondary | ICD-10-CM | POA: Diagnosis not present

## 2018-11-27 DIAGNOSIS — H43811 Vitreous degeneration, right eye: Secondary | ICD-10-CM | POA: Diagnosis not present

## 2018-11-27 DIAGNOSIS — I129 Hypertensive chronic kidney disease with stage 1 through stage 4 chronic kidney disease, or unspecified chronic kidney disease: Secondary | ICD-10-CM | POA: Diagnosis not present

## 2018-11-27 DIAGNOSIS — N183 Chronic kidney disease, stage 3 (moderate): Secondary | ICD-10-CM | POA: Diagnosis not present

## 2018-11-27 DIAGNOSIS — E1122 Type 2 diabetes mellitus with diabetic chronic kidney disease: Secondary | ICD-10-CM | POA: Diagnosis not present

## 2018-11-27 DIAGNOSIS — E1136 Type 2 diabetes mellitus with diabetic cataract: Secondary | ICD-10-CM | POA: Diagnosis not present

## 2018-11-27 DIAGNOSIS — E785 Hyperlipidemia, unspecified: Secondary | ICD-10-CM | POA: Diagnosis not present

## 2018-11-28 DIAGNOSIS — Z961 Presence of intraocular lens: Secondary | ICD-10-CM | POA: Diagnosis not present

## 2018-11-28 DIAGNOSIS — H25812 Combined forms of age-related cataract, left eye: Secondary | ICD-10-CM | POA: Diagnosis not present

## 2018-12-01 DIAGNOSIS — H25813 Combined forms of age-related cataract, bilateral: Secondary | ICD-10-CM | POA: Diagnosis not present

## 2018-12-01 DIAGNOSIS — Z01812 Encounter for preprocedural laboratory examination: Secondary | ICD-10-CM | POA: Diagnosis not present

## 2018-12-01 DIAGNOSIS — Z1159 Encounter for screening for other viral diseases: Secondary | ICD-10-CM | POA: Diagnosis not present

## 2018-12-04 DIAGNOSIS — I129 Hypertensive chronic kidney disease with stage 1 through stage 4 chronic kidney disease, or unspecified chronic kidney disease: Secondary | ICD-10-CM | POA: Diagnosis not present

## 2018-12-04 DIAGNOSIS — Z79899 Other long term (current) drug therapy: Secondary | ICD-10-CM | POA: Diagnosis not present

## 2018-12-04 DIAGNOSIS — H527 Unspecified disorder of refraction: Secondary | ICD-10-CM | POA: Diagnosis not present

## 2018-12-04 DIAGNOSIS — H43813 Vitreous degeneration, bilateral: Secondary | ICD-10-CM | POA: Diagnosis not present

## 2018-12-04 DIAGNOSIS — E785 Hyperlipidemia, unspecified: Secondary | ICD-10-CM | POA: Diagnosis not present

## 2018-12-04 DIAGNOSIS — N182 Chronic kidney disease, stage 2 (mild): Secondary | ICD-10-CM | POA: Diagnosis not present

## 2018-12-04 DIAGNOSIS — E113293 Type 2 diabetes mellitus with mild nonproliferative diabetic retinopathy without macular edema, bilateral: Secondary | ICD-10-CM | POA: Diagnosis not present

## 2018-12-04 DIAGNOSIS — E039 Hypothyroidism, unspecified: Secondary | ICD-10-CM | POA: Diagnosis not present

## 2018-12-04 DIAGNOSIS — F419 Anxiety disorder, unspecified: Secondary | ICD-10-CM | POA: Diagnosis not present

## 2018-12-04 DIAGNOSIS — E1136 Type 2 diabetes mellitus with diabetic cataract: Secondary | ICD-10-CM | POA: Diagnosis not present

## 2018-12-04 DIAGNOSIS — H25812 Combined forms of age-related cataract, left eye: Secondary | ICD-10-CM | POA: Diagnosis not present

## 2018-12-04 DIAGNOSIS — H25813 Combined forms of age-related cataract, bilateral: Secondary | ICD-10-CM | POA: Diagnosis not present

## 2018-12-04 DIAGNOSIS — E1122 Type 2 diabetes mellitus with diabetic chronic kidney disease: Secondary | ICD-10-CM | POA: Diagnosis not present

## 2018-12-05 DIAGNOSIS — Z961 Presence of intraocular lens: Secondary | ICD-10-CM | POA: Diagnosis not present

## 2018-12-19 DIAGNOSIS — M179 Osteoarthritis of knee, unspecified: Secondary | ICD-10-CM | POA: Diagnosis not present

## 2018-12-19 DIAGNOSIS — N183 Chronic kidney disease, stage 3 (moderate): Secondary | ICD-10-CM | POA: Diagnosis not present

## 2018-12-19 DIAGNOSIS — E782 Mixed hyperlipidemia: Secondary | ICD-10-CM | POA: Diagnosis not present

## 2018-12-19 DIAGNOSIS — E039 Hypothyroidism, unspecified: Secondary | ICD-10-CM | POA: Diagnosis not present

## 2018-12-19 DIAGNOSIS — I10 Essential (primary) hypertension: Secondary | ICD-10-CM | POA: Diagnosis not present

## 2018-12-19 DIAGNOSIS — E1140 Type 2 diabetes mellitus with diabetic neuropathy, unspecified: Secondary | ICD-10-CM | POA: Diagnosis not present

## 2018-12-19 DIAGNOSIS — M1990 Unspecified osteoarthritis, unspecified site: Secondary | ICD-10-CM | POA: Diagnosis not present

## 2018-12-19 DIAGNOSIS — D510 Vitamin B12 deficiency anemia due to intrinsic factor deficiency: Secondary | ICD-10-CM | POA: Diagnosis not present

## 2018-12-20 DIAGNOSIS — E559 Vitamin D deficiency, unspecified: Secondary | ICD-10-CM | POA: Diagnosis not present

## 2018-12-20 DIAGNOSIS — N814 Uterovaginal prolapse, unspecified: Secondary | ICD-10-CM | POA: Diagnosis not present

## 2018-12-20 DIAGNOSIS — E1140 Type 2 diabetes mellitus with diabetic neuropathy, unspecified: Secondary | ICD-10-CM | POA: Diagnosis not present

## 2018-12-20 DIAGNOSIS — E039 Hypothyroidism, unspecified: Secondary | ICD-10-CM | POA: Diagnosis not present

## 2018-12-20 DIAGNOSIS — Z0000 Encounter for general adult medical examination without abnormal findings: Secondary | ICD-10-CM | POA: Diagnosis not present

## 2018-12-20 DIAGNOSIS — N183 Chronic kidney disease, stage 3 (moderate): Secondary | ICD-10-CM | POA: Diagnosis not present

## 2018-12-20 DIAGNOSIS — E538 Deficiency of other specified B group vitamins: Secondary | ICD-10-CM | POA: Diagnosis not present

## 2018-12-20 DIAGNOSIS — D510 Vitamin B12 deficiency anemia due to intrinsic factor deficiency: Secondary | ICD-10-CM | POA: Diagnosis not present

## 2018-12-20 DIAGNOSIS — F411 Generalized anxiety disorder: Secondary | ICD-10-CM | POA: Diagnosis not present

## 2018-12-20 DIAGNOSIS — Z1231 Encounter for screening mammogram for malignant neoplasm of breast: Secondary | ICD-10-CM | POA: Diagnosis not present

## 2018-12-20 DIAGNOSIS — G4700 Insomnia, unspecified: Secondary | ICD-10-CM | POA: Diagnosis not present

## 2018-12-20 DIAGNOSIS — E782 Mixed hyperlipidemia: Secondary | ICD-10-CM | POA: Diagnosis not present

## 2018-12-20 DIAGNOSIS — Z01419 Encounter for gynecological examination (general) (routine) without abnormal findings: Secondary | ICD-10-CM | POA: Diagnosis not present

## 2018-12-20 DIAGNOSIS — I10 Essential (primary) hypertension: Secondary | ICD-10-CM | POA: Diagnosis not present

## 2018-12-25 DIAGNOSIS — Z4689 Encounter for fitting and adjustment of other specified devices: Secondary | ICD-10-CM | POA: Diagnosis not present

## 2018-12-25 DIAGNOSIS — M4317 Spondylolisthesis, lumbosacral region: Secondary | ICD-10-CM | POA: Diagnosis not present

## 2018-12-25 DIAGNOSIS — I10 Essential (primary) hypertension: Secondary | ICD-10-CM | POA: Diagnosis not present

## 2018-12-25 DIAGNOSIS — Z6839 Body mass index (BMI) 39.0-39.9, adult: Secondary | ICD-10-CM | POA: Diagnosis not present

## 2018-12-27 ENCOUNTER — Other Ambulatory Visit: Payer: Self-pay | Admitting: Obstetrics and Gynecology

## 2018-12-27 DIAGNOSIS — R140 Abdominal distension (gaseous): Secondary | ICD-10-CM

## 2018-12-31 DIAGNOSIS — N3020 Other chronic cystitis without hematuria: Secondary | ICD-10-CM | POA: Diagnosis not present

## 2018-12-31 DIAGNOSIS — N3941 Urge incontinence: Secondary | ICD-10-CM | POA: Diagnosis not present

## 2019-01-07 ENCOUNTER — Ambulatory Visit
Admission: RE | Admit: 2019-01-07 | Discharge: 2019-01-07 | Disposition: A | Discharge status: Home / Self Care | Payer: Medicare Other | Source: Ambulatory Visit | Attending: Obstetrics and Gynecology | Admitting: Obstetrics and Gynecology

## 2019-01-07 DIAGNOSIS — N281 Cyst of kidney, acquired: Secondary | ICD-10-CM | POA: Diagnosis not present

## 2019-01-07 DIAGNOSIS — K760 Fatty (change of) liver, not elsewhere classified: Secondary | ICD-10-CM | POA: Diagnosis not present

## 2019-01-07 DIAGNOSIS — R140 Abdominal distension (gaseous): Secondary | ICD-10-CM

## 2019-01-10 DIAGNOSIS — I10 Essential (primary) hypertension: Secondary | ICD-10-CM | POA: Diagnosis not present

## 2019-01-10 DIAGNOSIS — R609 Edema, unspecified: Secondary | ICD-10-CM | POA: Diagnosis not present

## 2019-01-27 DIAGNOSIS — S82831A Other fracture of upper and lower end of right fibula, initial encounter for closed fracture: Secondary | ICD-10-CM | POA: Diagnosis not present

## 2019-01-27 DIAGNOSIS — E785 Hyperlipidemia, unspecified: Secondary | ICD-10-CM | POA: Diagnosis not present

## 2019-01-27 DIAGNOSIS — Z794 Long term (current) use of insulin: Secondary | ICD-10-CM | POA: Diagnosis not present

## 2019-01-27 DIAGNOSIS — Z880 Allergy status to penicillin: Secondary | ICD-10-CM | POA: Diagnosis not present

## 2019-01-27 DIAGNOSIS — Z7982 Long term (current) use of aspirin: Secondary | ICD-10-CM | POA: Diagnosis not present

## 2019-01-27 DIAGNOSIS — N189 Chronic kidney disease, unspecified: Secondary | ICD-10-CM | POA: Diagnosis not present

## 2019-01-27 DIAGNOSIS — Z043 Encounter for examination and observation following other accident: Secondary | ICD-10-CM | POA: Diagnosis not present

## 2019-01-27 DIAGNOSIS — G8911 Acute pain due to trauma: Secondary | ICD-10-CM | POA: Diagnosis not present

## 2019-01-27 DIAGNOSIS — I129 Hypertensive chronic kidney disease with stage 1 through stage 4 chronic kidney disease, or unspecified chronic kidney disease: Secondary | ICD-10-CM | POA: Diagnosis not present

## 2019-01-27 DIAGNOSIS — Z888 Allergy status to other drugs, medicaments and biological substances status: Secondary | ICD-10-CM | POA: Diagnosis not present

## 2019-01-27 DIAGNOSIS — E1140 Type 2 diabetes mellitus with diabetic neuropathy, unspecified: Secondary | ICD-10-CM | POA: Diagnosis not present

## 2019-01-27 DIAGNOSIS — Z886 Allergy status to analgesic agent status: Secondary | ICD-10-CM | POA: Diagnosis not present

## 2019-01-27 DIAGNOSIS — Z79899 Other long term (current) drug therapy: Secondary | ICD-10-CM | POA: Diagnosis not present

## 2019-01-27 DIAGNOSIS — S82451A Displaced comminuted fracture of shaft of right fibula, initial encounter for closed fracture: Secondary | ICD-10-CM | POA: Diagnosis not present

## 2019-01-27 DIAGNOSIS — S82491A Other fracture of shaft of right fibula, initial encounter for closed fracture: Secondary | ICD-10-CM | POA: Diagnosis not present

## 2019-01-27 DIAGNOSIS — E1122 Type 2 diabetes mellitus with diabetic chronic kidney disease: Secondary | ICD-10-CM | POA: Diagnosis not present

## 2019-01-30 DIAGNOSIS — M79604 Pain in right leg: Secondary | ICD-10-CM | POA: Diagnosis not present

## 2019-01-30 DIAGNOSIS — M25511 Pain in right shoulder: Secondary | ICD-10-CM | POA: Diagnosis not present

## 2019-02-05 DIAGNOSIS — E1122 Type 2 diabetes mellitus with diabetic chronic kidney disease: Secondary | ICD-10-CM | POA: Diagnosis not present

## 2019-02-05 DIAGNOSIS — Z96612 Presence of left artificial shoulder joint: Secondary | ICD-10-CM | POA: Diagnosis not present

## 2019-02-05 DIAGNOSIS — Z794 Long term (current) use of insulin: Secondary | ICD-10-CM | POA: Diagnosis not present

## 2019-02-05 DIAGNOSIS — Q2732 Arteriovenous malformation of vessel of lower limb: Secondary | ICD-10-CM | POA: Diagnosis not present

## 2019-02-05 DIAGNOSIS — E663 Overweight: Secondary | ICD-10-CM | POA: Diagnosis not present

## 2019-02-05 DIAGNOSIS — Z95828 Presence of other vascular implants and grafts: Secondary | ICD-10-CM | POA: Diagnosis not present

## 2019-02-05 DIAGNOSIS — Z6841 Body Mass Index (BMI) 40.0 and over, adult: Secondary | ICD-10-CM | POA: Diagnosis not present

## 2019-02-05 DIAGNOSIS — I129 Hypertensive chronic kidney disease with stage 1 through stage 4 chronic kidney disease, or unspecified chronic kidney disease: Secondary | ICD-10-CM | POA: Diagnosis not present

## 2019-02-05 DIAGNOSIS — Z96651 Presence of right artificial knee joint: Secondary | ICD-10-CM | POA: Diagnosis not present

## 2019-02-05 DIAGNOSIS — N182 Chronic kidney disease, stage 2 (mild): Secondary | ICD-10-CM | POA: Diagnosis not present

## 2019-02-05 DIAGNOSIS — M25511 Pain in right shoulder: Secondary | ICD-10-CM | POA: Diagnosis not present

## 2019-02-05 DIAGNOSIS — R600 Localized edema: Secondary | ICD-10-CM | POA: Diagnosis not present

## 2019-02-05 DIAGNOSIS — S82291D Other fracture of shaft of right tibia, subsequent encounter for closed fracture with routine healing: Secondary | ICD-10-CM | POA: Diagnosis not present

## 2019-02-05 DIAGNOSIS — M1712 Unilateral primary osteoarthritis, left knee: Secondary | ICD-10-CM | POA: Diagnosis not present

## 2019-02-07 DIAGNOSIS — E1122 Type 2 diabetes mellitus with diabetic chronic kidney disease: Secondary | ICD-10-CM | POA: Diagnosis not present

## 2019-02-07 DIAGNOSIS — M25511 Pain in right shoulder: Secondary | ICD-10-CM | POA: Diagnosis not present

## 2019-02-07 DIAGNOSIS — N182 Chronic kidney disease, stage 2 (mild): Secondary | ICD-10-CM | POA: Diagnosis not present

## 2019-02-07 DIAGNOSIS — I129 Hypertensive chronic kidney disease with stage 1 through stage 4 chronic kidney disease, or unspecified chronic kidney disease: Secondary | ICD-10-CM | POA: Diagnosis not present

## 2019-02-07 DIAGNOSIS — M1712 Unilateral primary osteoarthritis, left knee: Secondary | ICD-10-CM | POA: Diagnosis not present

## 2019-02-07 DIAGNOSIS — S82291D Other fracture of shaft of right tibia, subsequent encounter for closed fracture with routine healing: Secondary | ICD-10-CM | POA: Diagnosis not present

## 2019-02-12 DIAGNOSIS — I129 Hypertensive chronic kidney disease with stage 1 through stage 4 chronic kidney disease, or unspecified chronic kidney disease: Secondary | ICD-10-CM | POA: Diagnosis not present

## 2019-02-12 DIAGNOSIS — E1122 Type 2 diabetes mellitus with diabetic chronic kidney disease: Secondary | ICD-10-CM | POA: Diagnosis not present

## 2019-02-12 DIAGNOSIS — N182 Chronic kidney disease, stage 2 (mild): Secondary | ICD-10-CM | POA: Diagnosis not present

## 2019-02-12 DIAGNOSIS — S82291D Other fracture of shaft of right tibia, subsequent encounter for closed fracture with routine healing: Secondary | ICD-10-CM | POA: Diagnosis not present

## 2019-02-12 DIAGNOSIS — M25511 Pain in right shoulder: Secondary | ICD-10-CM | POA: Diagnosis not present

## 2019-02-12 DIAGNOSIS — M1712 Unilateral primary osteoarthritis, left knee: Secondary | ICD-10-CM | POA: Diagnosis not present

## 2019-02-15 DIAGNOSIS — M25511 Pain in right shoulder: Secondary | ICD-10-CM | POA: Diagnosis not present

## 2019-02-15 DIAGNOSIS — M1712 Unilateral primary osteoarthritis, left knee: Secondary | ICD-10-CM | POA: Diagnosis not present

## 2019-02-15 DIAGNOSIS — S82291D Other fracture of shaft of right tibia, subsequent encounter for closed fracture with routine healing: Secondary | ICD-10-CM | POA: Diagnosis not present

## 2019-02-15 DIAGNOSIS — N182 Chronic kidney disease, stage 2 (mild): Secondary | ICD-10-CM | POA: Diagnosis not present

## 2019-02-15 DIAGNOSIS — E1122 Type 2 diabetes mellitus with diabetic chronic kidney disease: Secondary | ICD-10-CM | POA: Diagnosis not present

## 2019-02-15 DIAGNOSIS — I129 Hypertensive chronic kidney disease with stage 1 through stage 4 chronic kidney disease, or unspecified chronic kidney disease: Secondary | ICD-10-CM | POA: Diagnosis not present

## 2019-02-18 DIAGNOSIS — E1122 Type 2 diabetes mellitus with diabetic chronic kidney disease: Secondary | ICD-10-CM | POA: Diagnosis not present

## 2019-02-18 DIAGNOSIS — M1712 Unilateral primary osteoarthritis, left knee: Secondary | ICD-10-CM | POA: Diagnosis not present

## 2019-02-18 DIAGNOSIS — I129 Hypertensive chronic kidney disease with stage 1 through stage 4 chronic kidney disease, or unspecified chronic kidney disease: Secondary | ICD-10-CM | POA: Diagnosis not present

## 2019-02-18 DIAGNOSIS — S82291D Other fracture of shaft of right tibia, subsequent encounter for closed fracture with routine healing: Secondary | ICD-10-CM | POA: Diagnosis not present

## 2019-02-18 DIAGNOSIS — M25511 Pain in right shoulder: Secondary | ICD-10-CM | POA: Diagnosis not present

## 2019-02-18 DIAGNOSIS — N182 Chronic kidney disease, stage 2 (mild): Secondary | ICD-10-CM | POA: Diagnosis not present

## 2019-02-20 DIAGNOSIS — M25561 Pain in right knee: Secondary | ICD-10-CM | POA: Diagnosis not present

## 2019-02-21 DIAGNOSIS — E1122 Type 2 diabetes mellitus with diabetic chronic kidney disease: Secondary | ICD-10-CM | POA: Diagnosis not present

## 2019-02-21 DIAGNOSIS — N182 Chronic kidney disease, stage 2 (mild): Secondary | ICD-10-CM | POA: Diagnosis not present

## 2019-02-21 DIAGNOSIS — M1712 Unilateral primary osteoarthritis, left knee: Secondary | ICD-10-CM | POA: Diagnosis not present

## 2019-02-21 DIAGNOSIS — M25511 Pain in right shoulder: Secondary | ICD-10-CM | POA: Diagnosis not present

## 2019-02-21 DIAGNOSIS — I129 Hypertensive chronic kidney disease with stage 1 through stage 4 chronic kidney disease, or unspecified chronic kidney disease: Secondary | ICD-10-CM | POA: Diagnosis not present

## 2019-02-21 DIAGNOSIS — S82291D Other fracture of shaft of right tibia, subsequent encounter for closed fracture with routine healing: Secondary | ICD-10-CM | POA: Diagnosis not present

## 2019-02-25 DIAGNOSIS — I129 Hypertensive chronic kidney disease with stage 1 through stage 4 chronic kidney disease, or unspecified chronic kidney disease: Secondary | ICD-10-CM | POA: Diagnosis not present

## 2019-02-25 DIAGNOSIS — M1712 Unilateral primary osteoarthritis, left knee: Secondary | ICD-10-CM | POA: Diagnosis not present

## 2019-02-25 DIAGNOSIS — M25511 Pain in right shoulder: Secondary | ICD-10-CM | POA: Diagnosis not present

## 2019-02-25 DIAGNOSIS — E1122 Type 2 diabetes mellitus with diabetic chronic kidney disease: Secondary | ICD-10-CM | POA: Diagnosis not present

## 2019-02-25 DIAGNOSIS — S82291D Other fracture of shaft of right tibia, subsequent encounter for closed fracture with routine healing: Secondary | ICD-10-CM | POA: Diagnosis not present

## 2019-02-25 DIAGNOSIS — N182 Chronic kidney disease, stage 2 (mild): Secondary | ICD-10-CM | POA: Diagnosis not present

## 2019-02-26 DIAGNOSIS — D510 Vitamin B12 deficiency anemia due to intrinsic factor deficiency: Secondary | ICD-10-CM | POA: Diagnosis not present

## 2019-02-26 DIAGNOSIS — M179 Osteoarthritis of knee, unspecified: Secondary | ICD-10-CM | POA: Diagnosis not present

## 2019-02-26 DIAGNOSIS — E782 Mixed hyperlipidemia: Secondary | ICD-10-CM | POA: Diagnosis not present

## 2019-02-26 DIAGNOSIS — E039 Hypothyroidism, unspecified: Secondary | ICD-10-CM | POA: Diagnosis not present

## 2019-02-26 DIAGNOSIS — E1140 Type 2 diabetes mellitus with diabetic neuropathy, unspecified: Secondary | ICD-10-CM | POA: Diagnosis not present

## 2019-02-26 DIAGNOSIS — M1990 Unspecified osteoarthritis, unspecified site: Secondary | ICD-10-CM | POA: Diagnosis not present

## 2019-02-26 DIAGNOSIS — I10 Essential (primary) hypertension: Secondary | ICD-10-CM | POA: Diagnosis not present

## 2019-02-26 DIAGNOSIS — Z23 Encounter for immunization: Secondary | ICD-10-CM | POA: Diagnosis not present

## 2019-02-26 DIAGNOSIS — N183 Chronic kidney disease, stage 3 (moderate): Secondary | ICD-10-CM | POA: Diagnosis not present

## 2019-02-27 DIAGNOSIS — N182 Chronic kidney disease, stage 2 (mild): Secondary | ICD-10-CM | POA: Diagnosis not present

## 2019-02-27 DIAGNOSIS — E1122 Type 2 diabetes mellitus with diabetic chronic kidney disease: Secondary | ICD-10-CM | POA: Diagnosis not present

## 2019-02-27 DIAGNOSIS — S82291D Other fracture of shaft of right tibia, subsequent encounter for closed fracture with routine healing: Secondary | ICD-10-CM | POA: Diagnosis not present

## 2019-02-27 DIAGNOSIS — M1712 Unilateral primary osteoarthritis, left knee: Secondary | ICD-10-CM | POA: Diagnosis not present

## 2019-02-27 DIAGNOSIS — M25511 Pain in right shoulder: Secondary | ICD-10-CM | POA: Diagnosis not present

## 2019-02-27 DIAGNOSIS — I129 Hypertensive chronic kidney disease with stage 1 through stage 4 chronic kidney disease, or unspecified chronic kidney disease: Secondary | ICD-10-CM | POA: Diagnosis not present

## 2019-03-05 DIAGNOSIS — M25511 Pain in right shoulder: Secondary | ICD-10-CM | POA: Diagnosis not present

## 2019-03-05 DIAGNOSIS — S82291D Other fracture of shaft of right tibia, subsequent encounter for closed fracture with routine healing: Secondary | ICD-10-CM | POA: Diagnosis not present

## 2019-03-05 DIAGNOSIS — M1712 Unilateral primary osteoarthritis, left knee: Secondary | ICD-10-CM | POA: Diagnosis not present

## 2019-03-05 DIAGNOSIS — N182 Chronic kidney disease, stage 2 (mild): Secondary | ICD-10-CM | POA: Diagnosis not present

## 2019-03-05 DIAGNOSIS — E1122 Type 2 diabetes mellitus with diabetic chronic kidney disease: Secondary | ICD-10-CM | POA: Diagnosis not present

## 2019-03-05 DIAGNOSIS — I129 Hypertensive chronic kidney disease with stage 1 through stage 4 chronic kidney disease, or unspecified chronic kidney disease: Secondary | ICD-10-CM | POA: Diagnosis not present

## 2019-03-07 DIAGNOSIS — Z96651 Presence of right artificial knee joint: Secondary | ICD-10-CM | POA: Diagnosis not present

## 2019-03-07 DIAGNOSIS — S82291D Other fracture of shaft of right tibia, subsequent encounter for closed fracture with routine healing: Secondary | ICD-10-CM | POA: Diagnosis not present

## 2019-03-07 DIAGNOSIS — M25511 Pain in right shoulder: Secondary | ICD-10-CM | POA: Diagnosis not present

## 2019-03-07 DIAGNOSIS — N182 Chronic kidney disease, stage 2 (mild): Secondary | ICD-10-CM | POA: Diagnosis not present

## 2019-03-07 DIAGNOSIS — Z794 Long term (current) use of insulin: Secondary | ICD-10-CM | POA: Diagnosis not present

## 2019-03-07 DIAGNOSIS — I129 Hypertensive chronic kidney disease with stage 1 through stage 4 chronic kidney disease, or unspecified chronic kidney disease: Secondary | ICD-10-CM | POA: Diagnosis not present

## 2019-03-07 DIAGNOSIS — Z95828 Presence of other vascular implants and grafts: Secondary | ICD-10-CM | POA: Diagnosis not present

## 2019-03-07 DIAGNOSIS — Z96612 Presence of left artificial shoulder joint: Secondary | ICD-10-CM | POA: Diagnosis not present

## 2019-03-07 DIAGNOSIS — Q2732 Arteriovenous malformation of vessel of lower limb: Secondary | ICD-10-CM | POA: Diagnosis not present

## 2019-03-07 DIAGNOSIS — R600 Localized edema: Secondary | ICD-10-CM | POA: Diagnosis not present

## 2019-03-07 DIAGNOSIS — E663 Overweight: Secondary | ICD-10-CM | POA: Diagnosis not present

## 2019-03-07 DIAGNOSIS — Z6841 Body Mass Index (BMI) 40.0 and over, adult: Secondary | ICD-10-CM | POA: Diagnosis not present

## 2019-03-07 DIAGNOSIS — M1712 Unilateral primary osteoarthritis, left knee: Secondary | ICD-10-CM | POA: Diagnosis not present

## 2019-03-07 DIAGNOSIS — E1122 Type 2 diabetes mellitus with diabetic chronic kidney disease: Secondary | ICD-10-CM | POA: Diagnosis not present

## 2019-03-12 DIAGNOSIS — M25511 Pain in right shoulder: Secondary | ICD-10-CM | POA: Diagnosis not present

## 2019-03-12 DIAGNOSIS — N182 Chronic kidney disease, stage 2 (mild): Secondary | ICD-10-CM | POA: Diagnosis not present

## 2019-03-12 DIAGNOSIS — I129 Hypertensive chronic kidney disease with stage 1 through stage 4 chronic kidney disease, or unspecified chronic kidney disease: Secondary | ICD-10-CM | POA: Diagnosis not present

## 2019-03-12 DIAGNOSIS — E1122 Type 2 diabetes mellitus with diabetic chronic kidney disease: Secondary | ICD-10-CM | POA: Diagnosis not present

## 2019-03-12 DIAGNOSIS — S82291D Other fracture of shaft of right tibia, subsequent encounter for closed fracture with routine healing: Secondary | ICD-10-CM | POA: Diagnosis not present

## 2019-03-12 DIAGNOSIS — M1712 Unilateral primary osteoarthritis, left knee: Secondary | ICD-10-CM | POA: Diagnosis not present

## 2019-03-20 DIAGNOSIS — M25511 Pain in right shoulder: Secondary | ICD-10-CM | POA: Diagnosis not present

## 2019-03-20 DIAGNOSIS — N182 Chronic kidney disease, stage 2 (mild): Secondary | ICD-10-CM | POA: Diagnosis not present

## 2019-03-20 DIAGNOSIS — I129 Hypertensive chronic kidney disease with stage 1 through stage 4 chronic kidney disease, or unspecified chronic kidney disease: Secondary | ICD-10-CM | POA: Diagnosis not present

## 2019-03-20 DIAGNOSIS — E1122 Type 2 diabetes mellitus with diabetic chronic kidney disease: Secondary | ICD-10-CM | POA: Diagnosis not present

## 2019-03-20 DIAGNOSIS — S82291D Other fracture of shaft of right tibia, subsequent encounter for closed fracture with routine healing: Secondary | ICD-10-CM | POA: Diagnosis not present

## 2019-03-20 DIAGNOSIS — M1712 Unilateral primary osteoarthritis, left knee: Secondary | ICD-10-CM | POA: Diagnosis not present

## 2019-03-25 DIAGNOSIS — M25561 Pain in right knee: Secondary | ICD-10-CM | POA: Diagnosis not present

## 2019-04-04 ENCOUNTER — Other Ambulatory Visit: Payer: Self-pay

## 2019-04-04 ENCOUNTER — Ambulatory Visit
Admission: RE | Admit: 2019-04-04 | Discharge: 2019-04-04 | Disposition: A | Discharge status: Home / Self Care | Payer: Medicare Other | Source: Ambulatory Visit | Attending: Otolaryngology | Admitting: Otolaryngology

## 2019-04-04 ENCOUNTER — Other Ambulatory Visit: Payer: Medicare Other

## 2019-04-04 ENCOUNTER — Other Ambulatory Visit: Payer: Self-pay | Admitting: Otolaryngology

## 2019-04-04 DIAGNOSIS — K1120 Sialoadenitis, unspecified: Secondary | ICD-10-CM

## 2019-04-04 DIAGNOSIS — R221 Localized swelling, mass and lump, neck: Secondary | ICD-10-CM | POA: Diagnosis not present

## 2019-04-04 DIAGNOSIS — K115 Sialolithiasis: Secondary | ICD-10-CM | POA: Diagnosis not present

## 2019-04-08 DIAGNOSIS — K115 Sialolithiasis: Secondary | ICD-10-CM | POA: Diagnosis not present

## 2019-04-11 DIAGNOSIS — Z981 Arthrodesis status: Secondary | ICD-10-CM | POA: Diagnosis not present

## 2019-04-15 DIAGNOSIS — R220 Localized swelling, mass and lump, head: Secondary | ICD-10-CM | POA: Diagnosis not present

## 2019-04-15 DIAGNOSIS — K1120 Sialoadenitis, unspecified: Secondary | ICD-10-CM | POA: Diagnosis not present

## 2019-04-15 DIAGNOSIS — K115 Sialolithiasis: Secondary | ICD-10-CM | POA: Diagnosis not present

## 2019-04-29 DIAGNOSIS — M25561 Pain in right knee: Secondary | ICD-10-CM | POA: Diagnosis not present

## 2019-04-29 DIAGNOSIS — M25511 Pain in right shoulder: Secondary | ICD-10-CM | POA: Diagnosis not present

## 2019-06-06 ENCOUNTER — Other Ambulatory Visit: Payer: Self-pay | Admitting: Family Medicine

## 2019-06-06 DIAGNOSIS — E1140 Type 2 diabetes mellitus with diabetic neuropathy, unspecified: Secondary | ICD-10-CM

## 2019-06-18 ENCOUNTER — Ambulatory Visit: Payer: Medicare Other | Attending: Internal Medicine

## 2019-06-18 DIAGNOSIS — Z23 Encounter for immunization: Secondary | ICD-10-CM | POA: Diagnosis not present

## 2019-06-18 NOTE — Progress Notes (Signed)
   Covid-19 Vaccination Clinic  Name:  Abigail [name]    MRN: [id number] DOB: [birth year]  06/18/2019  [name] was observed post Covid-19 immunization for 15 minutes without incidence. She was provided with Vaccine Information Sheet and instruction to access the V-Safe system.   [name] was instructed to call 911 with any severe reactions post vaccine: [place] Kitchen Difficulty breathing  . Swelling of your face and throat  . A fast heartbeat  . A bad rash all over your body  . Dizziness and weakness    Immunizations Administered    Name Date Dose VIS Date Route   Pfizer COVID-19 Vaccine 06/18/2019 10:50 AM 0.3 mL 05/10/2019 Intramuscular   Manufacturer: [employer]   Lot: [id number]   [place]: [id number]

## 2019-06-20 DIAGNOSIS — Z981 Arthrodesis status: Secondary | ICD-10-CM | POA: Diagnosis not present

## 2019-06-20 DIAGNOSIS — Z6839 Body mass index (BMI) 39.0-39.9, adult: Secondary | ICD-10-CM | POA: Diagnosis not present

## 2019-06-20 DIAGNOSIS — I10 Essential (primary) hypertension: Secondary | ICD-10-CM | POA: Diagnosis not present

## 2019-06-20 DIAGNOSIS — M4317 Spondylolisthesis, lumbosacral region: Secondary | ICD-10-CM | POA: Diagnosis not present

## 2019-06-20 DIAGNOSIS — M5417 Radiculopathy, lumbosacral region: Secondary | ICD-10-CM | POA: Diagnosis not present

## 2019-07-06 ENCOUNTER — Ambulatory Visit: Payer: Medicare Other | Attending: Internal Medicine

## 2019-07-06 DIAGNOSIS — Z23 Encounter for immunization: Secondary | ICD-10-CM

## 2019-07-06 NOTE — Progress Notes (Signed)
   Covid-19 Vaccination Clinic  Name:  Abigail [name]    MRN: [id number] DOB: 1940-09-09  07/06/2019  [name] was observed post Covid-19 immunization for 15 minutes without incidence. She was provided with Vaccine Information Sheet and instruction to access the V-Safe system.   [name] was instructed to call 911 with any severe reactions post vaccine: Marland Kitchen Difficulty breathing  . Swelling of your face and throat  . A fast heartbeat  . A bad rash all over your body  . Dizziness and weakness    Immunizations Administered    Name Date Dose VIS Date Route   Pfizer COVID-19 Vaccine 07/06/2019 11:02 AM 0.3 mL 05/10/2019 Intramuscular   Manufacturer: [place]   Lot: [id number]   [place]: [id number]

## 2019-07-07 ENCOUNTER — Ambulatory Visit: Payer: Medicare Other

## 2019-07-09 DIAGNOSIS — E1140 Type 2 diabetes mellitus with diabetic neuropathy, unspecified: Secondary | ICD-10-CM | POA: Diagnosis not present

## 2019-07-09 DIAGNOSIS — N183 Chronic kidney disease, stage 3 unspecified: Secondary | ICD-10-CM | POA: Diagnosis not present

## 2019-07-09 DIAGNOSIS — E782 Mixed hyperlipidemia: Secondary | ICD-10-CM | POA: Diagnosis not present

## 2019-07-09 DIAGNOSIS — M179 Osteoarthritis of knee, unspecified: Secondary | ICD-10-CM | POA: Diagnosis not present

## 2019-07-09 DIAGNOSIS — I10 Essential (primary) hypertension: Secondary | ICD-10-CM | POA: Diagnosis not present

## 2019-07-09 DIAGNOSIS — E039 Hypothyroidism, unspecified: Secondary | ICD-10-CM | POA: Diagnosis not present

## 2019-07-09 DIAGNOSIS — M1990 Unspecified osteoarthritis, unspecified site: Secondary | ICD-10-CM | POA: Diagnosis not present

## 2019-07-09 DIAGNOSIS — D510 Vitamin B12 deficiency anemia due to intrinsic factor deficiency: Secondary | ICD-10-CM | POA: Diagnosis not present

## 2019-07-30 DIAGNOSIS — E1140 Type 2 diabetes mellitus with diabetic neuropathy, unspecified: Secondary | ICD-10-CM | POA: Diagnosis not present

## 2019-08-02 DIAGNOSIS — M25511 Pain in right shoulder: Secondary | ICD-10-CM | POA: Diagnosis not present

## 2019-08-02 DIAGNOSIS — M25561 Pain in right knee: Secondary | ICD-10-CM | POA: Diagnosis not present

## 2019-08-09 DIAGNOSIS — I10 Essential (primary) hypertension: Secondary | ICD-10-CM | POA: Diagnosis not present

## 2019-08-09 DIAGNOSIS — N183 Chronic kidney disease, stage 3 unspecified: Secondary | ICD-10-CM | POA: Diagnosis not present

## 2019-08-09 DIAGNOSIS — D510 Vitamin B12 deficiency anemia due to intrinsic factor deficiency: Secondary | ICD-10-CM | POA: Diagnosis not present

## 2019-08-09 DIAGNOSIS — M179 Osteoarthritis of knee, unspecified: Secondary | ICD-10-CM | POA: Diagnosis not present

## 2019-08-09 DIAGNOSIS — E039 Hypothyroidism, unspecified: Secondary | ICD-10-CM | POA: Diagnosis not present

## 2019-08-09 DIAGNOSIS — G4700 Insomnia, unspecified: Secondary | ICD-10-CM | POA: Diagnosis not present

## 2019-08-09 DIAGNOSIS — M1990 Unspecified osteoarthritis, unspecified site: Secondary | ICD-10-CM | POA: Diagnosis not present

## 2019-08-09 DIAGNOSIS — E782 Mixed hyperlipidemia: Secondary | ICD-10-CM | POA: Diagnosis not present

## 2019-08-09 DIAGNOSIS — E1140 Type 2 diabetes mellitus with diabetic neuropathy, unspecified: Secondary | ICD-10-CM | POA: Diagnosis not present

## 2019-08-21 ENCOUNTER — Other Ambulatory Visit: Payer: Self-pay | Admitting: Family Medicine

## 2019-08-21 DIAGNOSIS — E1140 Type 2 diabetes mellitus with diabetic neuropathy, unspecified: Secondary | ICD-10-CM

## 2019-08-21 DIAGNOSIS — E2839 Other primary ovarian failure: Secondary | ICD-10-CM

## 2019-08-23 ENCOUNTER — Ambulatory Visit
Admission: RE | Admit: 2019-08-23 | Discharge: 2019-08-23 | Disposition: A | Discharge status: Home / Self Care | Payer: Medicare Other | Source: Ambulatory Visit | Attending: Family Medicine | Admitting: Family Medicine

## 2019-08-23 ENCOUNTER — Other Ambulatory Visit: Payer: Self-pay

## 2019-08-23 DIAGNOSIS — Z13820 Encounter for screening for osteoporosis: Secondary | ICD-10-CM | POA: Diagnosis not present

## 2019-08-23 DIAGNOSIS — E2839 Other primary ovarian failure: Secondary | ICD-10-CM

## 2019-08-23 DIAGNOSIS — Z780 Asymptomatic menopausal state: Secondary | ICD-10-CM | POA: Diagnosis not present

## 2019-08-23 DIAGNOSIS — E1140 Type 2 diabetes mellitus with diabetic neuropathy, unspecified: Secondary | ICD-10-CM

## 2019-09-18 DIAGNOSIS — M5417 Radiculopathy, lumbosacral region: Secondary | ICD-10-CM | POA: Diagnosis not present

## 2019-09-18 DIAGNOSIS — Z981 Arthrodesis status: Secondary | ICD-10-CM | POA: Diagnosis not present

## 2019-09-18 DIAGNOSIS — M4317 Spondylolisthesis, lumbosacral region: Secondary | ICD-10-CM | POA: Diagnosis not present

## 2019-09-25 DIAGNOSIS — M179 Osteoarthritis of knee, unspecified: Secondary | ICD-10-CM | POA: Diagnosis not present

## 2019-09-25 DIAGNOSIS — E1140 Type 2 diabetes mellitus with diabetic neuropathy, unspecified: Secondary | ICD-10-CM | POA: Diagnosis not present

## 2019-09-25 DIAGNOSIS — E782 Mixed hyperlipidemia: Secondary | ICD-10-CM | POA: Diagnosis not present

## 2019-09-25 DIAGNOSIS — M1990 Unspecified osteoarthritis, unspecified site: Secondary | ICD-10-CM | POA: Diagnosis not present

## 2019-09-25 DIAGNOSIS — G4700 Insomnia, unspecified: Secondary | ICD-10-CM | POA: Diagnosis not present

## 2019-09-25 DIAGNOSIS — E039 Hypothyroidism, unspecified: Secondary | ICD-10-CM | POA: Diagnosis not present

## 2019-09-25 DIAGNOSIS — I10 Essential (primary) hypertension: Secondary | ICD-10-CM | POA: Diagnosis not present

## 2019-09-25 DIAGNOSIS — N183 Chronic kidney disease, stage 3 unspecified: Secondary | ICD-10-CM | POA: Diagnosis not present

## 2019-09-25 DIAGNOSIS — D510 Vitamin B12 deficiency anemia due to intrinsic factor deficiency: Secondary | ICD-10-CM | POA: Diagnosis not present

## 2019-09-30 DIAGNOSIS — R8290 Unspecified abnormal findings in urine: Secondary | ICD-10-CM | POA: Diagnosis not present

## 2019-09-30 DIAGNOSIS — L729 Follicular cyst of the skin and subcutaneous tissue, unspecified: Secondary | ICD-10-CM | POA: Diagnosis not present

## 2019-09-30 DIAGNOSIS — M5430 Sciatica, unspecified side: Secondary | ICD-10-CM | POA: Diagnosis not present

## 2019-09-30 DIAGNOSIS — E1140 Type 2 diabetes mellitus with diabetic neuropathy, unspecified: Secondary | ICD-10-CM | POA: Diagnosis not present

## 2019-09-30 DIAGNOSIS — I10 Essential (primary) hypertension: Secondary | ICD-10-CM | POA: Diagnosis not present

## 2019-12-04 DIAGNOSIS — I10 Essential (primary) hypertension: Secondary | ICD-10-CM | POA: Diagnosis not present

## 2019-12-04 DIAGNOSIS — E1140 Type 2 diabetes mellitus with diabetic neuropathy, unspecified: Secondary | ICD-10-CM | POA: Diagnosis not present

## 2019-12-04 DIAGNOSIS — M1990 Unspecified osteoarthritis, unspecified site: Secondary | ICD-10-CM | POA: Diagnosis not present

## 2019-12-04 DIAGNOSIS — G4700 Insomnia, unspecified: Secondary | ICD-10-CM | POA: Diagnosis not present

## 2019-12-04 DIAGNOSIS — D510 Vitamin B12 deficiency anemia due to intrinsic factor deficiency: Secondary | ICD-10-CM | POA: Diagnosis not present

## 2019-12-04 DIAGNOSIS — E782 Mixed hyperlipidemia: Secondary | ICD-10-CM | POA: Diagnosis not present

## 2019-12-04 DIAGNOSIS — N183 Chronic kidney disease, stage 3 unspecified: Secondary | ICD-10-CM | POA: Diagnosis not present

## 2019-12-04 DIAGNOSIS — E039 Hypothyroidism, unspecified: Secondary | ICD-10-CM | POA: Diagnosis not present

## 2019-12-04 DIAGNOSIS — M179 Osteoarthritis of knee, unspecified: Secondary | ICD-10-CM | POA: Diagnosis not present

## 2019-12-05 DIAGNOSIS — E039 Hypothyroidism, unspecified: Secondary | ICD-10-CM | POA: Diagnosis not present

## 2019-12-05 DIAGNOSIS — E782 Mixed hyperlipidemia: Secondary | ICD-10-CM | POA: Diagnosis not present

## 2019-12-05 DIAGNOSIS — E538 Deficiency of other specified B group vitamins: Secondary | ICD-10-CM | POA: Diagnosis not present

## 2019-12-05 DIAGNOSIS — D510 Vitamin B12 deficiency anemia due to intrinsic factor deficiency: Secondary | ICD-10-CM | POA: Diagnosis not present

## 2019-12-05 DIAGNOSIS — Z0000 Encounter for general adult medical examination without abnormal findings: Secondary | ICD-10-CM | POA: Diagnosis not present

## 2019-12-05 DIAGNOSIS — R350 Frequency of micturition: Secondary | ICD-10-CM | POA: Diagnosis not present

## 2019-12-05 DIAGNOSIS — E559 Vitamin D deficiency, unspecified: Secondary | ICD-10-CM | POA: Diagnosis not present

## 2019-12-05 DIAGNOSIS — N183 Chronic kidney disease, stage 3 unspecified: Secondary | ICD-10-CM | POA: Diagnosis not present

## 2019-12-05 DIAGNOSIS — M549 Dorsalgia, unspecified: Secondary | ICD-10-CM | POA: Diagnosis not present

## 2019-12-05 DIAGNOSIS — E1140 Type 2 diabetes mellitus with diabetic neuropathy, unspecified: Secondary | ICD-10-CM | POA: Diagnosis not present

## 2019-12-05 DIAGNOSIS — I10 Essential (primary) hypertension: Secondary | ICD-10-CM | POA: Diagnosis not present

## 2019-12-05 DIAGNOSIS — Z794 Long term (current) use of insulin: Secondary | ICD-10-CM | POA: Diagnosis not present

## 2019-12-18 DIAGNOSIS — G63 Polyneuropathy in diseases classified elsewhere: Secondary | ICD-10-CM | POA: Diagnosis not present

## 2019-12-18 DIAGNOSIS — Z981 Arthrodesis status: Secondary | ICD-10-CM | POA: Diagnosis not present

## 2019-12-18 DIAGNOSIS — M5412 Radiculopathy, cervical region: Secondary | ICD-10-CM | POA: Diagnosis not present

## 2019-12-18 DIAGNOSIS — E889 Metabolic disorder, unspecified: Secondary | ICD-10-CM | POA: Diagnosis not present

## 2020-01-01 DIAGNOSIS — Z794 Long term (current) use of insulin: Secondary | ICD-10-CM | POA: Diagnosis not present

## 2020-01-01 DIAGNOSIS — R300 Dysuria: Secondary | ICD-10-CM | POA: Diagnosis not present

## 2020-01-01 DIAGNOSIS — E1140 Type 2 diabetes mellitus with diabetic neuropathy, unspecified: Secondary | ICD-10-CM | POA: Diagnosis not present

## 2020-01-01 DIAGNOSIS — M542 Cervicalgia: Secondary | ICD-10-CM | POA: Diagnosis not present

## 2020-01-01 DIAGNOSIS — N390 Urinary tract infection, site not specified: Secondary | ICD-10-CM | POA: Diagnosis not present

## 2020-01-08 DIAGNOSIS — N1831 Chronic kidney disease, stage 3a: Secondary | ICD-10-CM | POA: Diagnosis not present

## 2020-01-10 DIAGNOSIS — L821 Other seborrheic keratosis: Secondary | ICD-10-CM | POA: Diagnosis not present

## 2020-01-10 DIAGNOSIS — L814 Other melanin hyperpigmentation: Secondary | ICD-10-CM | POA: Diagnosis not present

## 2020-01-10 DIAGNOSIS — L817 Pigmented purpuric dermatosis: Secondary | ICD-10-CM | POA: Diagnosis not present

## 2020-01-10 DIAGNOSIS — L820 Inflamed seborrheic keratosis: Secondary | ICD-10-CM | POA: Diagnosis not present

## 2020-01-15 DIAGNOSIS — Z1231 Encounter for screening mammogram for malignant neoplasm of breast: Secondary | ICD-10-CM | POA: Diagnosis not present

## 2020-01-15 DIAGNOSIS — R309 Painful micturition, unspecified: Secondary | ICD-10-CM | POA: Diagnosis not present

## 2020-01-15 DIAGNOSIS — N814 Uterovaginal prolapse, unspecified: Secondary | ICD-10-CM | POA: Diagnosis not present

## 2020-01-31 ENCOUNTER — Other Ambulatory Visit: Payer: Self-pay | Admitting: Obstetrics and Gynecology

## 2020-02-06 DIAGNOSIS — H26493 Other secondary cataract, bilateral: Secondary | ICD-10-CM | POA: Diagnosis not present

## 2020-02-06 DIAGNOSIS — H0100B Unspecified blepharitis left eye, upper and lower eyelids: Secondary | ICD-10-CM | POA: Diagnosis not present

## 2020-02-06 DIAGNOSIS — H527 Unspecified disorder of refraction: Secondary | ICD-10-CM | POA: Diagnosis not present

## 2020-02-06 DIAGNOSIS — E119 Type 2 diabetes mellitus without complications: Secondary | ICD-10-CM | POA: Diagnosis not present

## 2020-02-06 DIAGNOSIS — H02831 Dermatochalasis of right upper eyelid: Secondary | ICD-10-CM | POA: Diagnosis not present

## 2020-02-06 DIAGNOSIS — H353122 Nonexudative age-related macular degeneration, left eye, intermediate dry stage: Secondary | ICD-10-CM | POA: Diagnosis not present

## 2020-02-06 DIAGNOSIS — H0100A Unspecified blepharitis right eye, upper and lower eyelids: Secondary | ICD-10-CM | POA: Diagnosis not present

## 2020-02-06 DIAGNOSIS — H02834 Dermatochalasis of left upper eyelid: Secondary | ICD-10-CM | POA: Diagnosis not present

## 2020-02-10 DIAGNOSIS — Z23 Encounter for immunization: Secondary | ICD-10-CM | POA: Diagnosis not present

## 2020-02-12 DIAGNOSIS — R309 Painful micturition, unspecified: Secondary | ICD-10-CM | POA: Diagnosis not present

## 2020-02-14 DIAGNOSIS — M25521 Pain in right elbow: Secondary | ICD-10-CM | POA: Diagnosis not present

## 2020-02-14 DIAGNOSIS — M25511 Pain in right shoulder: Secondary | ICD-10-CM | POA: Diagnosis not present

## 2020-02-18 NOTE — Patient Instructions (Addendum)
YOU ARE SCHEDULED FOR A COVID TEST _9/25________@__11 :00__________. THIS TEST MUST BE DONE BEFORE SURGERY. GO TO  [place]. [place], [place], IT IS APPROXIMATELY 2 MINUTES PAST [employer] ON THE RIGHT AND REMAIN IN YOUR CAR, THIS IS A DRIVE UP TEST. ONCE YOUR COVID TEST IS DONE PLEASE FOLLOW ALL THE QUARANTINE  INSTRUCTIONS GIVEN IN YOUR HANDOUT.      Your procedure is scheduled on  02/26/20   Report to [facility] AT 7:00  A. M.   Call this number if you have problems the morning of surgery  :[phone].   OUR ADDRESS IS [place].  WE ARE LOCATED IN [facility].  PLEASE BRING YOUR INSURANCE CARD AND PHOTO ID DAY OF SURGERY.  ONLY ONE PERSON ALLOWED IN FACILITY WAITING AREA.                                     REMEMBER:  DO NOT EAT FOOD  AFTER MIDNIGHT .  You may have clear liquids until 4:30 AM  CLEAR LIQUID DIET   Foods Allowed                                                                     Foods Excluded  Coffee and tea, regular and decaf                             liquids that you cannot  Plain Jell-O any favor except red or purple                                           see through such as: Fruit ices (not with fruit pulp)                                     milk, soups, orange juice  Iced Popsicles                                    All solid food Carbonated beverages, regular and diet                                    Cranberry, grape and apple juices Sports drinks like Gatorade Lightly seasoned clear broth or consume(fat free) Sugar, honey syrup      YOU MAY  BRUSH YOUR TEETH MORNING OF SURGERY AND RINSE YOUR MOUTH OUT, NO CHEWING GUM CANDY OR MINTS.   TAKE THESE MEDICATIONS MORNING OF SURGERY WITH A SIP OF WATER:  Gabapentin, Cymbalta, Levothyroxine, Carvedilol, Amlodipine,   ONE VISITOR IS ALLOWED IN WAITING ROOM ONLY DAY OF SURGERY.    NO VISITOR MAY SPEND THE NIGHT.  VISITOR ARE ALLOWED TO  STAY UNTIL 800 PM.  DO NOT WEAR JEWERLY, MAKE UP, OR NAIL POLISH ON FINGERNAILS.  DO NOT WEAR LOTIONS, POWDERS, PERFUMES OR DEODORANT.  DO NOT SHAVE FOR 24 HOURS PRIOR TO DAY OF SURGERY.  CONTACTS, GLASSES, OR DENTURES MAY NOT BE WORN TO SURGERY.                                    [facility] IS NOT RESPONSIBLE  FOR ANY BELONGINGS.                                                                    Marland Kitchen                                                                                                       [facility] - Preparing for Surgery  Before surgery, you can play an important role.   Because skin is not sterile, your skin needs to be as free of germs as possible.   You can reduce the number of germs on your skin by washing with CHG (chlorahexidine gluconate) soap before surgery.   CHG is an antiseptic cleaner which kills germs and bonds with the skin to continue killing germs even after washing. Please DO NOT use if you have an allergy to CHG or antibacterial soaps .  If your skin becomes reddened/irritated stop using the CHG and inform your nurse when you arrive at Short Stay. Do not shave (including legs and underarms) for at least 48 hours prior to the first CHG shower.   Please follow these instructions carefully:  1.  Shower with CHG Soap the night before surgery and the  morning of Surgery.  2.  If you choose to wash your hair, wash your hair first as usual with your  normal  shampoo.  3.  After you shampoo, rinse your hair and body thoroughly to remove the  shampoo.                                        4.  Use CHG as you would any other liquid soap.  You can apply chg directly  to the skin and wash                       Gently with a scrungie or clean washcloth.  5.  Apply the CHG Soap to your body ONLY FROM THE NECK DOWN.   Do not use on face/ open                           Wound or open sores. Avoid contact with eyes, ears mouth and  genitals (private parts).                       Wash face,  Genitals (private parts) with your normal soap.             6.  Wash thoroughly, paying special attention to the area where your surgery  will be performed.  7.  Thoroughly rinse your body with warm water from the neck down.  8.  DO NOT shower/wash with your normal soap after using and rinsing off  the CHG Soap.             9.  Pat yourself dry with a clean towel.            10.  Wear clean pajamas.            11.  Place clean sheets on your bed the night of your first shower and do not  sleep with pets. Day of Surgery : Do not apply any lotions/deodorants the morning of surgery.  Please wear clean clothes to the hospital/surgery center.  FAILURE TO FOLLOW THESE INSTRUCTIONS MAY RESULT IN THE CANCELLATION OF YOUR SURGERY PATIENT SIGNATURE_________________________________  NURSE SIGNATURE__________________________________  ________________________________________________________________________   Abigail [name]  An incentive spirometer is a tool that can help keep your lungs clear and active. This tool measures how well you are filling your lungs with each breath. Taking long deep breaths may help reverse or decrease the chance of developing breathing (pulmonary) problems (especially infection) following:  A long period of time when you are unable to move or be active. BEFORE THE PROCEDURE   If the spirometer includes an indicator to show your best effort, your nurse or respiratory therapist will set it to a desired goal.  If possible, sit up straight or lean slightly forward. Try not to slouch.  Hold the incentive spirometer in an upright position. INSTRUCTIONS FOR USE  1. Sit on the edge of your bed if possible, or sit up as far as you can in bed or on a chair. 2. Hold the incentive spirometer in an upright position. 3. Breathe out normally. 4. Place the mouthpiece in your mouth and seal your lips tightly around  it. 5. Breathe in slowly and as deeply as possible, raising the piston or the ball toward the top of the column. 6. Hold your breath for 3-5 seconds or for as long as possible. Allow the piston or ball to fall to the bottom of the column. 7. Remove the mouthpiece from your mouth and breathe out normally. 8. Rest for a few seconds and repeat Steps 1 through 7 at least 10 times every 1-2 hours when you are awake. Take your time and take a few normal breaths between deep breaths. 9. The spirometer may include an indicator to show your best effort. Use the indicator as a goal to work toward during each repetition. 10. After each set of 10 deep breaths, practice coughing to be sure your lungs are clear. If you have an incision (the cut made at the time of surgery), support your incision when coughing by placing a pillow or rolled up towels firmly against it. Once you are able to get out of bed, walk around indoors and cough well. You may stop using the incentive spirometer when instructed by your caregiver.  RISKS AND COMPLICATIONS  Take your time so you do not get dizzy or light-headed.  If you are in pain, you may need to take  or ask for pain medication before doing incentive spirometry. It is harder to take a deep breath if you are having pain. AFTER USE  Rest and breathe slowly and easily.  It can be helpful to keep track of a log of your progress. Your caregiver can provide you with a simple table to help with this. If you are using the spirometer at home, follow these instructions: [place] IF:   You are having difficultly using the spirometer.  You have trouble using the spirometer as often as instructed.  Your pain medication is not giving enough relief while using the spirometer.  You develop fever of 100.5 F (38.1 C) or higher. SEEK IMMEDIATE MEDICAL CARE IF:   You cough up bloody sputum that had not been present before.  You develop fever of 102 F (38.9 C) or  greater.  You develop worsening pain at or near the incision site. MAKE SURE YOU:   Understand these instructions.  Will watch your condition.  Will get help right away if you are not doing well or get worse. Document Released: 09/26/2006 Document Revised: 08/08/2011 Document Reviewed: 11/27/2006 ExitCare Patient Information 2014 ExitCare, [place].   ________________________________________________________________________ How to Manage Your Diabetes Before and After Surgery  Why is it important to control my blood sugar before and after surgery? . Improving blood sugar levels before and after surgery helps healing and can limit problems. . A way of improving blood sugar control is eating a healthy diet by: o  Eating less sugar and carbohydrates o  Increasing activity/exercise o  Talking with your doctor about reaching your blood sugar goals . High blood sugars (greater than 180 mg/dL) can raise your risk of infections and slow your recovery, so you will need to focus on controlling your diabetes during the weeks before surgery. . Make sure that the doctor who takes care of your diabetes knows about your planned surgery including the date and location.  How do I manage my blood sugar before surgery? . Check your blood sugar at least 4 times a day, starting 2 days before surgery, to make sure that the level is not too high or low. o Check your blood sugar the morning of your surgery when you wake up and every 2 hours until you get to the Short Stay unit. . If your blood sugar is less than 70 mg/dL, you will need to treat for low blood sugar: o Do not take insulin. o Treat a low blood sugar (less than 70 mg/dL) with  cup of clear juice (cranberry or apple), 4 glucose tablets, OR glucose gel. o Recheck blood sugar in 15 minutes after treatment (to make sure it is greater than 70 mg/dL). If your blood sugar is not greater than 70 mg/dL on recheck, call [phone] for further  instructions. . Report your blood sugar to the short stay nurse when you get to Short Stay.  . If you are admitted to the hospital after surgery: o Your blood sugar will be checked by the staff and you will probably be given insulin after surgery (instead of oral diabetes medicines) to make sure you have good blood sugar levels. o The goal for blood sugar control after surgery is 80-180 mg/dL.   WHAT DO I DO ABOUT MY DIABETES MEDICATION?  Marland Kitchen Do not take oral diabetes medicines (pills) the morning of surgery.  . THE NIGHT BEFORE SURGERY, take0     units of       insulin.       Marland Kitchen  THE MORNING OF SURGERY, take0   units of         insulin.  . The day of surgery, do not take other diabetes injectables, including Byetta (exenatide), Bydureon (exenatide ER), Victoza (liraglutide), or Trulicity (dulaglutide).  . If your CBG is greater than 220 mg/dL, you may take  of your sliding scale  . (correction) dose of insulin.    F

## 2020-02-19 ENCOUNTER — Encounter (HOSPITAL_COMMUNITY): Payer: Self-pay

## 2020-02-19 ENCOUNTER — Encounter (HOSPITAL_COMMUNITY)
Admission: RE | Admit: 2020-02-19 | Discharge: 2020-02-19 | Disposition: A | Discharge status: Home / Self Care | Payer: Medicare Other | Source: Ambulatory Visit | Attending: Obstetrics and Gynecology | Admitting: Obstetrics and Gynecology

## 2020-02-19 ENCOUNTER — Other Ambulatory Visit: Payer: Self-pay

## 2020-02-19 DIAGNOSIS — E118 Type 2 diabetes mellitus with unspecified complications: Secondary | ICD-10-CM | POA: Diagnosis not present

## 2020-02-19 DIAGNOSIS — Z01818 Encounter for other preprocedural examination: Secondary | ICD-10-CM | POA: Diagnosis not present

## 2020-02-19 HISTORY — DX: Unspecified osteoarthritis, unspecified site: M19.90

## 2020-02-19 LAB — COMPREHENSIVE METABOLIC PANEL
ALT: 14 U/L (ref 0–44)
AST: 17 U/L (ref 15–41)
Albumin: 3.7 g/dL (ref 3.5–5.0)
Alkaline Phosphatase: 67 U/L (ref 38–126)
Anion gap: 11 (ref 5–15)
BUN: 27 mg/dL — ABNORMAL HIGH (ref 8–23)
CO2: 30 mmol/L (ref 22–32)
Calcium: 9.5 mg/dL (ref 8.9–10.3)
Chloride: 100 mmol/L (ref 98–111)
Creatinine, Ser: 1.14 mg/dL — ABNORMAL HIGH (ref 0.44–1.00)
GFR calc Af Amer: 53 mL/min — ABNORMAL LOW (ref >60)
GFR calc non Af Amer: 46 mL/min — ABNORMAL LOW (ref >60)
Glucose, Bld: 190 mg/dL — ABNORMAL HIGH (ref 70–99)
Potassium: 3.6 mmol/L (ref 3.5–5.1)
Sodium: 141 mmol/L (ref 135–145)
Total Bilirubin: 0.8 mg/dL (ref 0.3–1.2)
Total Protein: 6.4 g/dL — ABNORMAL LOW (ref 6.5–8.1)

## 2020-02-19 LAB — CBC
HCT: 39.9 % (ref 36.0–46.0)
Hemoglobin: 13.4 g/dL (ref 12.0–15.0)
MCH: 30.5 pg (ref 26.0–34.0)
MCHC: 33.6 g/dL (ref 30.0–36.0)
MCV: 90.7 fL (ref 80.0–100.0)
Platelets: 268 10*3/uL (ref 150–400)
RBC: 4.4 MIL/uL (ref 3.87–5.11)
RDW: 13.1 % (ref 11.5–15.5)
WBC: 8.6 10*3/uL (ref 4.0–10.5)
nRBC: 0 % (ref 0.0–0.2)

## 2020-02-19 LAB — HEMOGLOBIN A1C
Hgb A1c MFr Bld: 6.8 % — ABNORMAL HIGH (ref 4.8–5.6)
Mean Plasma Glucose: 148.46 mg/dL

## 2020-02-19 LAB — GLUCOSE, CAPILLARY: Glucose-Capillary: 190 mg/dL — ABNORMAL HIGH (ref 70–99)

## 2020-02-19 NOTE — Progress Notes (Signed)
COVID Vaccine Completed:Yes Date COVID Vaccine completed:07/06/19 COVID vaccine manufacturer: [employer]       PCP - Dr. Lavina Hamman Cardiologist - no  Chest x-ray - no EKG - 02/19/20 Stress Test - no ECHO -  Pt thinks years ago maybe 2011. Mild heart murmur Cardiac Cath - no Pacemaker/ICD device last checked:NA  Sleep Study - no CPAP -   Fasting Blood Sugar - 69-190 Checks Blood Sugar _QID____ times a day  Blood Thinner Instructions:ASA 81 mg/ Dr. Harrington Challenger Aspirin Instructions: none received pt was told to call Dr. Elwin Sleight Last Dose:  Anesthesia review:   Patient denies shortness of breath, fever, cough and chest pain at PAT appointment yes   Patient verbalized understanding of instructions that were given to them at the PAT appointment. Patient was also instructed that they will need to review over the PAT instructions again at home before surgery. Yes  Pt doesn't climb stairs but has no SOB doing housework of with ADLs. Her BMI is 40

## 2020-02-22 ENCOUNTER — Other Ambulatory Visit (HOSPITAL_COMMUNITY)
Admission: RE | Admit: 2020-02-22 | Discharge: 2020-02-22 | Disposition: A | Discharge status: Home / Self Care | Payer: Medicare Other | Source: Ambulatory Visit | Attending: Obstetrics and Gynecology | Admitting: Obstetrics and Gynecology

## 2020-02-22 DIAGNOSIS — Z20822 Contact with and (suspected) exposure to covid-19: Secondary | ICD-10-CM | POA: Insufficient documentation

## 2020-02-22 DIAGNOSIS — Z01812 Encounter for preprocedural laboratory examination: Secondary | ICD-10-CM | POA: Diagnosis not present

## 2020-02-22 LAB — SARS CORONAVIRUS 2 (TAT 6-24 HRS): SARS Coronavirus 2: NEGATIVE

## 2020-02-26 ENCOUNTER — Encounter (HOSPITAL_BASED_OUTPATIENT_CLINIC_OR_DEPARTMENT_OTHER)
Admission: RE | Disposition: A | Discharge status: Home / Self Care | Payer: Self-pay | Source: Home / Self Care | Attending: Obstetrics and Gynecology

## 2020-02-26 ENCOUNTER — Other Ambulatory Visit: Payer: Self-pay

## 2020-02-26 ENCOUNTER — Ambulatory Visit (HOSPITAL_BASED_OUTPATIENT_CLINIC_OR_DEPARTMENT_OTHER): Payer: Medicare Other | Admitting: Anesthesiology

## 2020-02-26 ENCOUNTER — Ambulatory Visit (HOSPITAL_BASED_OUTPATIENT_CLINIC_OR_DEPARTMENT_OTHER)
Admission: RE | Admit: 2020-02-26 | Discharge: 2020-02-27 | Disposition: A | Discharge status: Home / Self Care | Payer: Medicare Other | Attending: Obstetrics and Gynecology | Admitting: Obstetrics and Gynecology

## 2020-02-26 ENCOUNTER — Encounter (HOSPITAL_BASED_OUTPATIENT_CLINIC_OR_DEPARTMENT_OTHER): Payer: Self-pay | Admitting: Obstetrics and Gynecology

## 2020-02-26 DIAGNOSIS — K219 Gastro-esophageal reflux disease without esophagitis: Secondary | ICD-10-CM | POA: Diagnosis not present

## 2020-02-26 DIAGNOSIS — Z800 Family history of malignant neoplasm of digestive organs: Secondary | ICD-10-CM | POA: Insufficient documentation

## 2020-02-26 DIAGNOSIS — E785 Hyperlipidemia, unspecified: Secondary | ICD-10-CM | POA: Insufficient documentation

## 2020-02-26 DIAGNOSIS — Z7982 Long term (current) use of aspirin: Secondary | ICD-10-CM | POA: Diagnosis not present

## 2020-02-26 DIAGNOSIS — Z880 Allergy status to penicillin: Secondary | ICD-10-CM | POA: Insufficient documentation

## 2020-02-26 DIAGNOSIS — Z79899 Other long term (current) drug therapy: Secondary | ICD-10-CM | POA: Insufficient documentation

## 2020-02-26 DIAGNOSIS — N189 Chronic kidney disease, unspecified: Secondary | ICD-10-CM | POA: Diagnosis not present

## 2020-02-26 DIAGNOSIS — Z881 Allergy status to other antibiotic agents status: Secondary | ICD-10-CM | POA: Insufficient documentation

## 2020-02-26 DIAGNOSIS — N72 Inflammatory disease of cervix uteri: Secondary | ICD-10-CM | POA: Diagnosis not present

## 2020-02-26 DIAGNOSIS — M797 Fibromyalgia: Secondary | ICD-10-CM | POA: Insufficient documentation

## 2020-02-26 DIAGNOSIS — F329 Major depressive disorder, single episode, unspecified: Secondary | ICD-10-CM | POA: Insufficient documentation

## 2020-02-26 DIAGNOSIS — Z886 Allergy status to analgesic agent status: Secondary | ICD-10-CM | POA: Diagnosis not present

## 2020-02-26 DIAGNOSIS — E669 Obesity, unspecified: Secondary | ICD-10-CM | POA: Diagnosis not present

## 2020-02-26 DIAGNOSIS — N393 Stress incontinence (female) (male): Secondary | ICD-10-CM | POA: Insufficient documentation

## 2020-02-26 DIAGNOSIS — I129 Hypertensive chronic kidney disease with stage 1 through stage 4 chronic kidney disease, or unspecified chronic kidney disease: Secondary | ICD-10-CM | POA: Insufficient documentation

## 2020-02-26 DIAGNOSIS — N814 Uterovaginal prolapse, unspecified: Secondary | ICD-10-CM | POA: Diagnosis not present

## 2020-02-26 DIAGNOSIS — N812 Incomplete uterovaginal prolapse: Secondary | ICD-10-CM | POA: Diagnosis not present

## 2020-02-26 DIAGNOSIS — G8918 Other acute postprocedural pain: Secondary | ICD-10-CM | POA: Insufficient documentation

## 2020-02-26 DIAGNOSIS — Z883 Allergy status to other anti-infective agents status: Secondary | ICD-10-CM | POA: Diagnosis not present

## 2020-02-26 DIAGNOSIS — M1990 Unspecified osteoarthritis, unspecified site: Secondary | ICD-10-CM | POA: Insufficient documentation

## 2020-02-26 DIAGNOSIS — E039 Hypothyroidism, unspecified: Secondary | ICD-10-CM | POA: Diagnosis not present

## 2020-02-26 DIAGNOSIS — D631 Anemia in chronic kidney disease: Secondary | ICD-10-CM | POA: Diagnosis not present

## 2020-02-26 DIAGNOSIS — Z9049 Acquired absence of other specified parts of digestive tract: Secondary | ICD-10-CM | POA: Diagnosis not present

## 2020-02-26 DIAGNOSIS — Z794 Long term (current) use of insulin: Secondary | ICD-10-CM | POA: Insufficient documentation

## 2020-02-26 DIAGNOSIS — E1122 Type 2 diabetes mellitus with diabetic chronic kidney disease: Secondary | ICD-10-CM | POA: Insufficient documentation

## 2020-02-26 DIAGNOSIS — Z888 Allergy status to other drugs, medicaments and biological substances status: Secondary | ICD-10-CM | POA: Diagnosis not present

## 2020-02-26 DIAGNOSIS — Z6841 Body Mass Index (BMI) 40.0 and over, adult: Secondary | ICD-10-CM | POA: Diagnosis not present

## 2020-02-26 DIAGNOSIS — N183 Chronic kidney disease, stage 3 unspecified: Secondary | ICD-10-CM | POA: Diagnosis not present

## 2020-02-26 DIAGNOSIS — Z9889 Other specified postprocedural states: Secondary | ICD-10-CM

## 2020-02-26 DIAGNOSIS — R32 Unspecified urinary incontinence: Secondary | ICD-10-CM | POA: Diagnosis not present

## 2020-02-26 HISTORY — PX: VAGINAL HYSTERECTOMY: SHX2639

## 2020-02-26 HISTORY — PX: BLADDER SUSPENSION: SHX72

## 2020-02-26 HISTORY — PX: CYSTOCELE REPAIR: SHX163

## 2020-02-26 HISTORY — PX: CYSTOSCOPY: SHX5120

## 2020-02-26 LAB — GLUCOSE, CAPILLARY
Glucose-Capillary: 94 mg/dL (ref 70–99)
Glucose-Capillary: 138 mg/dL — ABNORMAL HIGH (ref 70–99)
Glucose-Capillary: 240 mg/dL — ABNORMAL HIGH (ref 70–99)
Glucose-Capillary: 323 mg/dL — ABNORMAL HIGH (ref 70–99)
Glucose-Capillary: 236 mg/dL — ABNORMAL HIGH (ref 70–99)

## 2020-02-26 LAB — TYPE AND SCREEN
ABO/RH(D): A NEG
Antibody Screen: NEGATIVE

## 2020-02-26 SURGERY — HYSTERECTOMY, VAGINAL
Anesthesia: General | Site: Vagina

## 2020-02-26 MED ORDER — ROCURONIUM BROMIDE 10 MG/ML (PF) SYRINGE
PREFILLED_SYRINGE | INTRAVENOUS | Status: AC
  Filled 2020-02-26: qty 10

## 2020-02-26 MED ORDER — DULOXETINE HCL 60 MG PO CPEP
60.0000 mg | ORAL_CAPSULE | Freq: Every day | ORAL | Status: DC
  Filled 2020-02-26: qty 1

## 2020-02-26 MED ORDER — SODIUM CHLORIDE 0.9 % IR SOLN
Status: DC | PRN
  Administered 2020-02-26: 2000 mL via INTRAVESICAL

## 2020-02-26 MED ORDER — DEXAMETHASONE SODIUM PHOSPHATE 10 MG/ML IJ SOLN
INTRAMUSCULAR | Status: AC
  Filled 2020-02-26: qty 1

## 2020-02-26 MED ORDER — GABAPENTIN 300 MG PO CAPS
ORAL_CAPSULE | ORAL | Status: AC
  Filled 2020-02-26: qty 1

## 2020-02-26 MED ORDER — LEVOTHYROXINE SODIUM 175 MCG PO TABS
175.0000 ug | ORAL_TABLET | Freq: Every day | ORAL | Status: DC
  Administered 2020-02-27: 175 ug via ORAL
  Filled 2020-02-26: qty 1

## 2020-02-26 MED ORDER — TIZANIDINE HCL 2 MG PO TABS
4.0000 mg | ORAL_TABLET | Freq: Every evening | ORAL | Status: DC | PRN
  Administered 2020-02-26: 4 mg via ORAL
  Filled 2020-02-26: qty 2

## 2020-02-26 MED ORDER — ESTRADIOL 0.1 MG/GM VA CREA
TOPICAL_CREAM | VAGINAL | Status: DC | PRN
  Administered 2020-02-26: 1 via VAGINAL

## 2020-02-26 MED ORDER — GABAPENTIN 300 MG PO CAPS
800.0000 mg | ORAL_CAPSULE | Freq: Every day | ORAL | Status: DC
  Administered 2020-02-26: 800 mg via ORAL

## 2020-02-26 MED ORDER — MENTHOL 3 MG MT LOZG: 1.0000 | LOZENGE | OROMUCOSAL | Status: DC | PRN

## 2020-02-26 MED ORDER — CLINDAMYCIN PHOSPHATE 900 MG/50ML IV SOLN
INTRAVENOUS | Status: AC
  Filled 2020-02-26: qty 50

## 2020-02-26 MED ORDER — ONDANSETRON HCL 4 MG/2ML IJ SOLN: 4.0000 mg | Freq: Once | INTRAMUSCULAR | Status: DC | PRN

## 2020-02-26 MED ORDER — THROMBIN 5000 UNITS EX SOLR
CUTANEOUS | Status: DC | PRN
  Administered 2020-02-26: 5000 [IU] via TOPICAL

## 2020-02-26 MED ORDER — CLINDAMYCIN PHOSPHATE 600 MG/50ML IV SOLN
INTRAVENOUS | Status: DC | PRN
  Administered 2020-02-26: 600 mg via INTRAVENOUS

## 2020-02-26 MED ORDER — ONDANSETRON HCL 4 MG/2ML IJ SOLN
INTRAMUSCULAR | Status: AC
  Filled 2020-02-26: qty 2

## 2020-02-26 MED ORDER — LACTATED RINGERS IV SOLN: INTRAVENOUS | Status: DC

## 2020-02-26 MED ORDER — FENTANYL CITRATE (PF) 100 MCG/2ML IJ SOLN
INTRAMUSCULAR | Status: AC
  Filled 2020-02-26: qty 2

## 2020-02-26 MED ORDER — SOD CITRATE-CITRIC ACID 500-334 MG/5ML PO SOLN: 30.0000 mL | ORAL | Status: DC

## 2020-02-26 MED ORDER — EPHEDRINE 5 MG/ML INJ
INTRAVENOUS | Status: AC
  Filled 2020-02-26: qty 10

## 2020-02-26 MED ORDER — ONDANSETRON HCL 4 MG PO TABS: 4.0000 mg | ORAL_TABLET | Freq: Four times a day (QID) | ORAL | Status: DC | PRN

## 2020-02-26 MED ORDER — GABAPENTIN 400 MG PO CAPS: 400.0000 mg | ORAL_CAPSULE | ORAL | Status: DC

## 2020-02-26 MED ORDER — METRONIDAZOLE IN NACL 5-0.79 MG/ML-% IV SOLN
INTRAVENOUS | Status: DC | PRN
  Administered 2020-02-26: 500 mg via INTRAVENOUS

## 2020-02-26 MED ORDER — FENTANYL CITRATE (PF) 100 MCG/2ML IJ SOLN
INTRAMUSCULAR | Status: DC | PRN
Start: 2020-02-26 — End: 2020-02-26
  Administered 2020-02-26: 25 ug via INTRAVENOUS
  Administered 2020-02-26: 75 ug via INTRAVENOUS

## 2020-02-26 MED ORDER — ONDANSETRON HCL 4 MG/2ML IJ SOLN
INTRAMUSCULAR | Status: DC | PRN
  Administered 2020-02-26: 4 mg via INTRAVENOUS

## 2020-02-26 MED ORDER — EPHEDRINE SULFATE-NACL 50-0.9 MG/10ML-% IV SOSY
PREFILLED_SYRINGE | INTRAVENOUS | Status: DC | PRN
  Administered 2020-02-26 (×2): 10 mg via INTRAVENOUS

## 2020-02-26 MED ORDER — LIDOCAINE 2% (20 MG/ML) 5 ML SYRINGE
INTRAMUSCULAR | Status: DC | PRN
  Administered 2020-02-26: 60 mg via INTRAVENOUS

## 2020-02-26 MED ORDER — FENTANYL CITRATE (PF) 100 MCG/2ML IJ SOLN
25.0000 ug | INTRAMUSCULAR | Status: DC | PRN
  Administered 2020-02-26 (×2): 25 ug via INTRAVENOUS
  Administered 2020-02-26: 50 ug via INTRAVENOUS

## 2020-02-26 MED ORDER — POVIDONE-IODINE 10 % EX SWAB: 2.0000 "application " | Freq: Once | CUTANEOUS | Status: DC

## 2020-02-26 MED ORDER — LEVOTHYROXINE SODIUM 175 MCG PO TABS
175.0000 ug | ORAL_TABLET | Freq: Every day | ORAL | Status: DC
  Filled 2020-02-26: qty 1

## 2020-02-26 MED ORDER — HYDROCODONE-ACETAMINOPHEN 5-325 MG PO TABS
ORAL_TABLET | ORAL | Status: AC
  Filled 2020-02-26: qty 1

## 2020-02-26 MED ORDER — ATROPINE SULFATE 0.4 MG/ML IV SOSY
PREFILLED_SYRINGE | INTRAVENOUS | Status: DC | PRN
  Administered 2020-02-26: .2 mg via INTRAVENOUS

## 2020-02-26 MED ORDER — HYDROCODONE-ACETAMINOPHEN 5-325 MG PO TABS
1.0000 | ORAL_TABLET | ORAL | Status: DC | PRN
  Administered 2020-02-26 – 2020-02-27 (×4): 2 via ORAL

## 2020-02-26 MED ORDER — LOSARTAN POTASSIUM-HCTZ 100-25 MG PO TABS
1.0000 | ORAL_TABLET | Freq: Every day | ORAL | Status: DC
Start: 2020-02-26 — End: 2020-02-26

## 2020-02-26 MED ORDER — MENTHOL 3 MG MT LOZG
LOZENGE | OROMUCOSAL | Status: AC
  Filled 2020-02-26: qty 9

## 2020-02-26 MED ORDER — LIDOCAINE-EPINEPHRINE (PF) 1 %-1:200000 IJ SOLN
INTRAMUSCULAR | Status: DC | PRN
  Administered 2020-02-26: 20 mL

## 2020-02-26 MED ORDER — PROPOFOL 10 MG/ML IV BOLUS
INTRAVENOUS | Status: DC | PRN
  Administered 2020-02-26: 200 mg via INTRAVENOUS

## 2020-02-26 MED ORDER — GABAPENTIN 300 MG PO CAPS
300.0000 mg | ORAL_CAPSULE | ORAL | Status: AC
  Administered 2020-02-26: 300 mg via ORAL

## 2020-02-26 MED ORDER — FLUTICASONE PROPIONATE 50 MCG/ACT NA SUSP
1.0000 | Freq: Every day | NASAL | Status: DC | PRN
  Filled 2020-02-26: qty 16

## 2020-02-26 MED ORDER — LOSARTAN POTASSIUM 50 MG PO TABS
100.0000 mg | ORAL_TABLET | Freq: Every day | ORAL | Status: DC
  Administered 2020-02-26: 100 mg via ORAL
  Filled 2020-02-26 (×2): qty 2

## 2020-02-26 MED ORDER — ONDANSETRON HCL 4 MG/2ML IJ SOLN: 4.0000 mg | Freq: Four times a day (QID) | INTRAMUSCULAR | Status: DC | PRN

## 2020-02-26 MED ORDER — AMITRIPTYLINE HCL 10 MG PO TABS
20.0000 mg | ORAL_TABLET | Freq: Every day | ORAL | Status: DC
  Administered 2020-02-26: 20 mg via ORAL
  Filled 2020-02-26: qty 2

## 2020-02-26 MED ORDER — MAGIC MOUTHWASH W/LIDOCAINE
10.0000 mL | Freq: Four times a day (QID) | ORAL | Status: DC | PRN
  Administered 2020-02-26 – 2020-02-27 (×3): 10 mL via ORAL
  Filled 2020-02-26 (×2): qty 10

## 2020-02-26 MED ORDER — KETOROLAC TROMETHAMINE 30 MG/ML IJ SOLN: 30.0000 mg | Freq: Four times a day (QID) | INTRAMUSCULAR | Status: DC

## 2020-02-26 MED ORDER — HYDROCODONE-ACETAMINOPHEN 5-325 MG PO TABS
1.0000 | ORAL_TABLET | ORAL | Status: DC | PRN
  Administered 2020-02-26 (×2): 1 via ORAL

## 2020-02-26 MED ORDER — GABAPENTIN 100 MG PO CAPS
ORAL_CAPSULE | ORAL | Status: AC
  Filled 2020-02-26: qty 2

## 2020-02-26 MED ORDER — ACETAMINOPHEN 500 MG PO TABS
ORAL_TABLET | ORAL | Status: AC
  Filled 2020-02-26: qty 2

## 2020-02-26 MED ORDER — GABAPENTIN 300 MG PO CAPS
ORAL_CAPSULE | ORAL | Status: AC
  Filled 2020-02-26: qty 2

## 2020-02-26 MED ORDER — GLYCOPYRROLATE PF 0.2 MG/ML IJ SOSY
PREFILLED_SYRINGE | INTRAMUSCULAR | Status: DC | PRN
  Administered 2020-02-26: .2 mg via INTRAVENOUS

## 2020-02-26 MED ORDER — GABAPENTIN 100 MG PO CAPS
ORAL_CAPSULE | ORAL | Status: AC
  Filled 2020-02-26: qty 1

## 2020-02-26 MED ORDER — HYDROCHLOROTHIAZIDE 25 MG PO TABS
25.0000 mg | ORAL_TABLET | Freq: Every day | ORAL | Status: DC
  Administered 2020-02-26: 25 mg via ORAL
  Filled 2020-02-26 (×2): qty 1

## 2020-02-26 MED ORDER — LEVOTHYROXINE SODIUM 175 MCG PO TABS: 175.0000 ug | ORAL_TABLET | Freq: Every day | ORAL | Status: DC

## 2020-02-26 MED ORDER — AMLODIPINE BESYLATE 5 MG PO TABS
5.0000 mg | ORAL_TABLET | Freq: Every day | ORAL | Status: DC
  Filled 2020-02-26 (×2): qty 1

## 2020-02-26 MED ORDER — GABAPENTIN 300 MG PO CAPS
400.0000 mg | ORAL_CAPSULE | Freq: Two times a day (BID) | ORAL | Status: DC
  Administered 2020-02-26 – 2020-02-27 (×2): 400 mg via ORAL

## 2020-02-26 MED ORDER — DEXAMETHASONE SODIUM PHOSPHATE 10 MG/ML IJ SOLN: 4.0000 mg | INTRAMUSCULAR | Status: DC

## 2020-02-26 MED ORDER — HYDROCODONE-ACETAMINOPHEN 5-325 MG PO TABS
ORAL_TABLET | ORAL | Status: AC
  Filled 2020-02-26: qty 2

## 2020-02-26 MED ORDER — PROPOFOL 10 MG/ML IV BOLUS
INTRAVENOUS | Status: AC
  Filled 2020-02-26: qty 20

## 2020-02-26 MED ORDER — ACETAMINOPHEN 500 MG PO TABS
1000.0000 mg | ORAL_TABLET | ORAL | Status: AC
  Administered 2020-02-26: 1000 mg via ORAL

## 2020-02-26 MED ORDER — POLYETHYLENE GLYCOL 3350 17 G PO PACK: 17.0000 g | PACK | Freq: Every day | ORAL | Status: DC | PRN

## 2020-02-26 MED ORDER — DEXAMETHASONE SODIUM PHOSPHATE 10 MG/ML IJ SOLN
INTRAMUSCULAR | Status: DC | PRN
  Administered 2020-02-26: 5 mg via INTRAVENOUS

## 2020-02-26 MED ORDER — INDIGOTINDISULFONATE SODIUM 8 MG/ML IJ SOLN
INTRAMUSCULAR | Status: DC | PRN
  Administered 2020-02-26: 40 mg via INTRAVENOUS

## 2020-02-26 MED ORDER — INSULIN ASPART 100 UNIT/ML ~~LOC~~ SOLN
0.0000 [IU] | Freq: Three times a day (TID) | SUBCUTANEOUS | Status: DC
  Administered 2020-02-26: 11 [IU] via SUBCUTANEOUS

## 2020-02-26 MED ORDER — INSULIN ASPART 100 UNIT/ML ~~LOC~~ SOLN
SUBCUTANEOUS | Status: AC
  Filled 2020-02-26: qty 1

## 2020-02-26 MED ORDER — LIDOCAINE 2% (20 MG/ML) 5 ML SYRINGE
INTRAMUSCULAR | Status: AC
  Filled 2020-02-26: qty 5

## 2020-02-26 MED ORDER — FENTANYL CITRATE (PF) 250 MCG/5ML IJ SOLN
INTRAMUSCULAR | Status: AC
Start: 2020-02-26 — End: ?
  Filled 2020-02-26: qty 5

## 2020-02-26 MED ORDER — CARVEDILOL 6.25 MG PO TABS
6.2500 mg | ORAL_TABLET | Freq: Two times a day (BID) | ORAL | Status: DC
  Administered 2020-02-26 – 2020-02-27 (×2): 6.25 mg via ORAL
  Filled 2020-02-26 (×2): qty 1

## 2020-02-26 MED ORDER — INSULIN DETEMIR 100 UNIT/ML ~~LOC~~ SOLN
53.0000 [IU] | Freq: Every morning | SUBCUTANEOUS | Status: DC
  Filled 2020-02-26 (×2): qty 0.53

## 2020-02-26 MED ORDER — METRONIDAZOLE IN NACL 5-0.79 MG/ML-% IV SOLN
INTRAVENOUS | Status: AC
  Filled 2020-02-26: qty 100

## 2020-02-26 SURGICAL SUPPLY — 49 items
BLADE SURG 15 STRL LF DISP TIS (BLADE) ×6 IMPLANT
BLADE SURG 15 STRL SS (BLADE) ×9
CANISTER SUCT 3000ML PPV (MISCELLANEOUS) ×3 IMPLANT
COVER MAYO STAND STRL (DRAPES) ×3 IMPLANT
COVER WAND RF STERILE (DRAPES) ×3 IMPLANT
DECANTER SPIKE VIAL GLASS SM (MISCELLANEOUS) IMPLANT
DERMABOND ADVANCED (GAUZE/BANDAGES/DRESSINGS) ×1
DERMABOND ADVANCED .7 DNX12 (GAUZE/BANDAGES/DRESSINGS) ×2 IMPLANT
GAUZE 4X4 16PLY RFD (DISPOSABLE) ×3 IMPLANT
GAUZE PACKING 2X5 YD STRL (GAUZE/BANDAGES/DRESSINGS) ×3 IMPLANT
GLOVE BIO SURGEON STRL SZ 6 (GLOVE) ×3 IMPLANT
GLOVE BIO SURGEON STRL SZ7 (GLOVE) ×3 IMPLANT
GLOVE BIOGEL PI IND STRL 6 (GLOVE) ×2 IMPLANT
GLOVE BIOGEL PI IND STRL 6.5 (GLOVE) ×2 IMPLANT
GLOVE BIOGEL PI IND STRL 7.0 (GLOVE) ×4 IMPLANT
GLOVE BIOGEL PI INDICATOR 6 (GLOVE) ×1
GLOVE BIOGEL PI INDICATOR 6.5 (GLOVE) ×1
GLOVE BIOGEL PI INDICATOR 7.0 (GLOVE) ×2
GOWN STRL REUS W/TWL LRG LVL3 (GOWN DISPOSABLE) ×12 IMPLANT
HOLDER FOLEY CATH W/STRAP (MISCELLANEOUS) ×3 IMPLANT
IV NS 1000ML (IV SOLUTION) ×6
IV NS 1000ML BAXH (IV SOLUTION) ×4 IMPLANT
LEGGING LITHOTOMY PAIR STRL (DRAPES) ×3 IMPLANT
MARKER SKIN DUAL TIP RULER LAB (MISCELLANEOUS) ×3 IMPLANT
NEEDLE HYPO 22GX1.5 SAFETY (NEEDLE) ×3 IMPLANT
NEEDLE SPNL 22GX3.5 QUINCKE BK (NEEDLE) IMPLANT
NS IRRIG 1000ML POUR BTL (IV SOLUTION) IMPLANT
NS IRRIG 500ML POUR BTL (IV SOLUTION) ×3 IMPLANT
PACK PERINEAL COLD (PAD) ×3 IMPLANT
PACK VAGINAL WOMENS (CUSTOM PROCEDURE TRAY) ×3 IMPLANT
PAD OB MATERNITY 4.3X12.25 (PERSONAL CARE ITEMS) IMPLANT
SET IRRIG Y TYPE TUR BLADDER L (SET/KITS/TRAYS/PACK) ×3 IMPLANT
SLING TRANS VAGINAL TAPE (Sling) ×1 IMPLANT
SLING UTERINE/ABD GYNECARE TVT (Sling) ×2 IMPLANT
SURGIFLO W/THROMBIN 8M KIT (HEMOSTASIS) ×3 IMPLANT
SUT VIC AB 0 CT1 18XCR BRD8 (SUTURE) ×6 IMPLANT
SUT VIC AB 0 CT1 8-18 (SUTURE) ×9
SUT VIC AB 2-0 CT1 (SUTURE) ×3 IMPLANT
SUT VIC AB 2-0 CT1 27 (SUTURE) ×3
SUT VIC AB 2-0 CT1 TAPERPNT 27 (SUTURE) ×2 IMPLANT
SUT VIC AB 2-0 UR6 27 (SUTURE) IMPLANT
SUT VIC AB 3-0 SH 27 (SUTURE)
SUT VIC AB 3-0 SH 27X BRD (SUTURE) IMPLANT
SUT VICRYL 0 TIES 12 18 (SUTURE) ×3 IMPLANT
SYR BULB IRRIG 60ML STRL (SYRINGE) ×3 IMPLANT
SYR TOOMEY IRRIG 70ML (MISCELLANEOUS)
SYRINGE TOOMEY IRRIG 70ML (MISCELLANEOUS) IMPLANT
TOWEL OR 17X26 10 PK STRL BLUE (TOWEL DISPOSABLE) ×3 IMPLANT
TRAY FOLEY W/BAG SLVR 14FR LF (SET/KITS/TRAYS/PACK) ×3 IMPLANT

## 2020-02-26 NOTE — Progress Notes (Signed)
Spoke with Dr. Philis Pique concerning Toradol and stage 3 CKD. Ok to discontinue Toradol.

## 2020-02-26 NOTE — H&P (Signed)
79 y.o. with chronic UTIs from uterine prolapse, hypermobile urethra and cystocele.  Pt has UTIs almost contstantly from not being able to empty bladder fully secondary cystocele and uterine prolapse.  She also experiences inscontinence with cough or sneeze.  Pessary was tried but pt did not tolerate.  She desires definitive surgery.  She does have constipation but no splinting.   Past Medical History:  Diagnosis Date  . Arthritis    shoulder, knee,   . Boils    near vaginal area  . Chronic kidney disease    stage 3; low kidney function; BUN was high acc to pt  . Constipation due to pain medication   . Depression   . Diabetes mellitus    Type 2  . Family hx of colon cancer   . Fibromyalgia   . Frequency of urination   . Frequent urination at night   . GERD (gastroesophageal reflux disease)    pt denies  . Heart murmur   . History of bladder infections   . Hyperlipidemia   . Hypertension   . Hypothyroidism   . Itchy eyes   . Obesity   . Obesity   . PONV (postoperative nausea and vomiting)    with stapedectomy and   . Torn rotator cuff left   current   Past Surgical History:  Procedure Laterality Date  . BACK SURGERY     lumbar fusion  . CARPAL TUNNEL RELEASE Right 06/29/2017   Procedure: RIGHT CARPAL TUNNEL RELEASE;  Surgeon: Daryll Brod, MD;  Location: [facility];  Service: Orthopedics;  Laterality: Right;  . CHOLECYSTECTOMY  1971  . COLONOSCOPY    . DILATION AND CURETTAGE OF UTERUS    . EYE SURGERY Bilateral 2020  . MAXIMUM ACCESS (MAS)POSTERIOR LUMBAR INTERBODY FUSION (PLIF) 1 LEVEL N/A 11/07/2014   Procedure: Lumbar Four-Five Maximum access posterior lumbar interbody fusion;  Surgeon: Erline Levine, MD;  Location: [facility];  Service: Neurosurgery;  Laterality: N/A;  L4-5 Maximum access posterior lumbar interbody fusion  . SHOULDER SURGERY     bilateral rotator cuff repairs  . STAPEDECTOMY Left    ear  . TUBAL LIGATION  1971    Social History    Socioeconomic History  . Marital status: Married    Spouse name: Not on file  . Number of children: 3  . Years of education: Not on file  . Highest education level: Not on file  Occupational History  . Occupation: Retired  Tobacco Use  . Smoking status: Never Smoker  . Smokeless tobacco: Never Used  Vaping Use  . Vaping Use: Never used  Substance and Sexual Activity  . Alcohol use: No  . Drug use: No  . Sexual activity: Not on file  Other Topics Concern  . Not on file  Social History Narrative  . Not on file   Social Determinants of Health   Financial Resource Strain:   . Difficulty of Paying Living Expenses: Not on file  Food Insecurity:   . Worried About Charity fundraiser in the Last Year: Not on file  . Ran Out of Food in the Last Year: Not on file  Transportation Needs:   . Lack of Transportation (Medical): Not on file  . Lack of Transportation (Non-Medical): Not on file  Physical Activity:   . Days of Exercise per Week: Not on file  . Minutes of Exercise per Session: Not on file  Stress:   . Feeling of Stress : Not on  file  Social Connections:   . Frequency of Communication with Friends and Family: Not on file  . Frequency of Social Gatherings with Friends and Family: Not on file  . Attends Religious Services: Not on file  . Active Member of Clubs or Organizations: Not on file  . Attends Archivist Meetings: Not on file  . Marital Status: Not on file  Intimate Partner Violence:   . Fear of Current or Ex-Partner: Not on file  . Emotionally Abused: Not on file  . Physically Abused: Not on file  . Sexually Abused: Not on file    No current facility-administered medications on file prior to encounter.   Current Outpatient Medications on File Prior to Encounter  Medication Sig Dispense Refill  . amitriptyline (ELAVIL) 10 MG tablet Take 20 mg by mouth at bedtime.     Marland Kitchen amLODipine (NORVASC) 5 MG tablet Take 5 mg by mouth daily.     Marland Kitchen aspirin EC  81 MG tablet Take 81 mg by mouth every evening.    . carvedilol (COREG) 6.25 MG tablet Take 6.25 mg by mouth 2 (two) times daily.     Marland Kitchen CRANBERRY PO Take 1 tablet by mouth at bedtime.    . DULoxetine (CYMBALTA) 60 MG capsule Take 60 mg by mouth daily.    . fluticasone (FLONASE) 50 MCG/ACT nasal spray Place 1-2 sprays into the nose daily as needed for allergies.     Marland Kitchen gabapentin (NEURONTIN) 400 MG capsule Take 1 capsule (400 mg total) by mouth 3 (three) times daily. (Patient taking differently: Take 400-800 mg by mouth See admin instructions. Take 400 mg in the morning, 400 mg at lunch, and 800 mg at supper)    . HYDROcodone-acetaminophen (NORCO/VICODIN) 5-325 MG tablet Take 2 tablets by mouth every 4 (four) hours as needed. (Patient taking differently: Take 0.5 tablets by mouth daily as needed for moderate pain. ) 10 tablet 0  . LEVEMIR FLEXTOUCH 100 UNIT/ML Pen Inject 53 Units into the skin every morning.   1  . levothyroxine (SYNTHROID) 175 MCG tablet Take 175 mcg by mouth daily before breakfast.    . losartan-hydrochlorothiazide (HYZAAR) 100-25 MG per tablet Take 1 tablet by mouth daily. For Hypertension    . Melatonin 3 MG CAPS Take 3 mg by mouth at bedtime.     . mometasone (ELOCON) 0.1 % cream Apply 1 application topically daily as needed (rash).    . Multiple Vitamin (MULTIVITAMIN) tablet Take 1 tablet by mouth every evening.     . mupirocin ointment (BACTROBAN) 2 % Apply 1 application topically daily as needed (wound care).     . polyethylene glycol (MIRALAX / GLYCOLAX) packet Take 17 g by mouth daily as needed for mild constipation.     . Polyvinyl Alcohol-Povidone (REFRESH OP) Place 1 drop into both eyes daily as needed (dry eyes).    . pravastatin (PRAVACHOL) 80 MG tablet Take 80 mg by mouth daily.    . Probiotic Product (PROBIOTIC DAILY PO) Take 1 capsule by mouth daily.    Marland Kitchen tiZANidine (ZANAFLEX) 4 MG capsule Take 4 mg by mouth at bedtime as needed for muscle spasms.     . traMADol  (ULTRAM) 50 MG tablet Take 1 tablet (50 mg total) by mouth every 6 (six) hours as needed. (Patient taking differently: Take 50 mg by mouth 2 (two) times daily as needed for moderate pain. ) 20 tablet 0  . Vitamin D, Ergocalciferol, (DRISDOL) 50000 UNITS CAPS Take 50,000 Units  by mouth every Wednesday.     Marland Kitchen acetaminophen (TYLENOL) 500 MG tablet Take 1,000 mg by mouth every 6 (six) hours as needed for moderate pain or headache.    . B Complex-C (B-COMPLEX WITH VITAMIN C) tablet Take 1 tablet by mouth every evening.    . Cyanocobalamin (VITAMIN B-12 IJ) Inject 1,000 mg as directed every 28 (twenty-eight) days.       Allergies  Allergen Reactions  . Nsaids Other (See Comments)    Stage 3 kidney disease  . Onglyza [Saxagliptin] Swelling    Made legs and ankles swell  . Penicillins Hives  . Polysporin [Bacitracin-Polymyxin B] Dermatitis and Other (See Comments)    Allergic to ointments such as polysporin, neosporin, cortisporin  . Cephalosporins Hives  . Ketoconazole Hives  . Tioconazole Hives  . Corticotropin Rash  . Lisinopril Cough  . Mucinex [Guaifenesin Er] Rash  . Victoza [Liraglutide] Other (See Comments)    constipation    Vitals:   02/26/20 0759  BP: (!) 153/69  Pulse: (!) 59  Resp: 18  Temp: 98.9 F (37.2 C)  TempSrc: Oral  SpO2: 98%  Weight: 101.5 kg  Height: 5\' 2"  (1.575 m)   Lungs: clear to ascultation Cor:  RRR Abdomen:  soft, nontender, nondistended. Ex:  no cords, erythema Pelvic:   .   Vulva: no masses, no atrophy, no lesions .   Vagina: no tenderness, no erythema, no abnormal vaginal discharge, no vesicle(s) or ulcers, no rectocele, cystocele (-2- fairly well suspended) .   Cervix: grossly normal, no discharge (none at all), no cervical motion tenderness .   Uterus: normal size (7), normal shape, midline, non-tender, uterine prolapse (to hymen now but still inside and not keritonized.) .   Bladder/Urethra: normal meatus, no urethral discharge, no urethral  mass, bladder non distended, Urethra well supported .   Adnexa/Parametria: no parametrial tenderness, no parametrial mass, no adnexal tenderness, no ovarian mass  Recent Results (from the past 2160 hour(s))  Glucose, capillary     Status: Abnormal   Collection Time: 02/19/20 11:07 AM  Result Value Ref Range   Glucose-Capillary 190 (H) 70 - 99 mg/dL    Comment: Glucose reference range applies only to samples taken after fasting for at least 8 hours.  Hemoglobin A1c per protocol     Status: Abnormal   Collection Time: 02/19/20 11:48 AM  Result Value Ref Range   Hgb A1c MFr Bld 6.8 (H) 4.8 - 5.6 %    Comment: (NOTE) Pre diabetes:          5.7%-6.4%  Diabetes:              >6.4%  Glycemic control for   <7.0% adults with diabetes    Mean Plasma Glucose 148.46 mg/dL    Comment: Performed at [facility] [address], [place] [address]  CBC     Status: None   Collection Time: 02/19/20 11:48 AM  Result Value Ref Range   WBC 8.6 4.0 - 10.5 K/uL   RBC 4.40 3.87 - 5.11 MIL/uL   Hemoglobin 13.4 12.0 - 15.0 g/dL   HCT 39.9 36 - 46 %   MCV 90.7 80.0 - 100.0 fL   MCH 30.5 26.0 - 34.0 pg   MCHC 33.6 30.0 - 36.0 g/dL   RDW 13.1 11.5 - 15.5 %   Platelets 268 150 - 400 K/uL   nRBC 0.0 0.0 - 0.2 %    Comment: Performed at [facility], [place] [address], [place], [place] [address]  Comprehensive metabolic panel     Status: Abnormal   Collection Time: 02/19/20 11:48 AM  Result Value Ref Range   Sodium 141 135 - 145 mmol/L   Potassium 3.6 3.5 - 5.1 mmol/L   Chloride 100 98 - 111 mmol/L   CO2 30 22 - 32 mmol/L   Glucose, Bld 190 (H) 70 - 99 mg/dL    Comment: Glucose reference range applies only to samples taken after fasting for at least 8 hours.   BUN 27 (H) 8 - 23 mg/dL   Creatinine, Ser 1.14 (H) 0.44 - 1.00 mg/dL   Calcium 9.5 8.9 - 10.3 mg/dL   Total Protein 6.4 (L) 6.5 - 8.1 g/dL   Albumin 3.7 3.5 - 5.0 g/dL   AST 17 15 - 41 U/L   ALT 14 0 - 44 U/L    Alkaline Phosphatase 67 38 - 126 U/L   Total Bilirubin 0.8 0.3 - 1.2 mg/dL   GFR calc non Af Amer 46 (L) >60 mL/min   GFR calc Af Amer 53 (L) >60 mL/min   Anion gap 11 5 - 15    Comment: Performed at [facility], [place] [address] [place], [place] [address]  Type and screen     Status: None   Collection Time: 02/19/20 11:48 AM  Result Value Ref Range   ABO/RH(D) A NEG    Antibody Screen NEG    Sample Expiration 02/29/2020,2359    Extend sample reason      NO TRANSFUSIONS OR PREGNANCY IN THE PAST 3 MONTHS Performed at [facility], [place] [address], [place], [place] [address]   SARS CORONAVIRUS 2 (TAT 6-24 HRS) Nasopharyngeal Nasopharyngeal Swab     Status: None   Collection Time: 02/22/20 10:52 AM   Specimen: Nasopharyngeal Swab  Result Value Ref Range   SARS Coronavirus 2 NEGATIVE NEGATIVE    Comment: (NOTE) SARS-CoV-2 target nucleic acids are NOT DETECTED.  The SARS-CoV-2 RNA is generally detectable in upper and lower respiratory specimens during the acute phase of infection. Negative results do not preclude SARS-CoV-2 infection, do not rule out co-infections with other pathogens, and should not be used as the sole basis for treatment or other patient management decisions. Negative results must be combined with clinical observations, patient history, and epidemiological information. The expected result is Negative.  Fact Sheet for Patients: SugarRoll.be  Fact Sheet for Healthcare Providers: https://www.woods-mathews.com/  This test is not yet approved or cleared by the [place] FDA and  has been authorized for detection and/or diagnosis of SARS-CoV-2 by FDA under an Emergency Use Authorization (EUA). This EUA will remain  in effect (meaning this test can be used) for the duration of the COVID-19 declaration under Se ction 564(b)(1) of the Act, 21 U.S.C. section 360bbb-3(b)(1), unless the  authorization is terminated or revoked sooner.  Performed at [facility], [place] [address], [place], [place] [address]     A:  79 yo with uterine prolapse, cystocele, SUI and failed conservative management.  She has chronic UTIs from the prolapse and would benefit from surgical therapy.     P: P: All risks, benefits and alternatives d/w patient and she desires to proceed.  Patient has undergone a modified diet, ERAS protocol and will receive preop antibiotics and SCDs during the operation.   Pt to have extended recovery and will  go home when eating, ambulating, voiding and pain control is good as  well as when sugars are normal.   Hydrocodone is fine for post op pain.  Pt does have back issues.  Pt does have some chronic kidney disease and her current Cr is 1.14.  [place]aria [place]

## 2020-02-26 NOTE — Anesthesia Preprocedure Evaluation (Addendum)
Anesthesia Evaluation  Patient identified by MRN, date of birth, ID band Patient awake    Reviewed: Allergy & Precautions, NPO status , Patient's Chart, lab work & pertinent test results  History of Anesthesia Complications (+) PONV and history of anesthetic complications  Airway Mallampati: III  TM Distance: >3 FB Neck ROM: Full    Dental no notable dental hx.    Pulmonary neg pulmonary ROS,    Pulmonary exam normal breath sounds clear to auscultation       Cardiovascular hypertension, Pt. on medications and Pt. on home beta blockers Normal cardiovascular exam Rhythm:Regular Rate:Normal  ECG: SB, rate 58   Neuro/Psych PSYCHIATRIC DISORDERS Depression negative neurological ROS     GI/Hepatic negative GI ROS, Neg liver ROS,   Endo/Other  diabetesHypothyroidism Morbid obesity  Renal/GU Renal disease     Musculoskeletal  (+) Arthritis , Fibromyalgia -  Abdominal (+) + obese,   Peds  Hematology HLD   Anesthesia Other Findings Incomplete prolapse; incontinence; frequency  Reproductive/Obstetrics                            Anesthesia Physical Anesthesia Plan  ASA: III  Anesthesia Plan: General   Post-op Pain Management:    Induction: Intravenous  PONV Risk Score and Plan: 4 or greater and Ondansetron, Dexamethasone, Propofol infusion and Treatment may vary due to age or medical condition  Airway Management Planned: LMA  Additional Equipment:   Intra-op Plan:   Post-operative Plan: Extubation in OR  Informed Consent: I have reviewed the patients History and Physical, chart, labs and discussed the procedure including the risks, benefits and alternatives for the proposed anesthesia with the patient or authorized representative who has indicated his/her understanding and acceptance.     Dental advisory given  Plan Discussed with: CRNA  Anesthesia Plan Comments:         Anesthesia Quick Evaluation

## 2020-02-26 NOTE — Discharge Summary (Addendum)
Physician Discharge Summary  Patient ID: Abigail [name] MRN: [id number] DOB/AGE: 06-18-1940 79 y.o.  Admit date: 02/26/2020 Discharge date: 02/27/2020  Admission Diagnoses:uterine prolapse, sui, cystocele  Discharge Diagnoses: same Active Problems:   Postoperative state   Discharged Condition: good  Hospital Course: Uncomplicated TVH/TVT/anterior repair and cystoscopy  Consults: None  Significant Diagnostic Studies: none  Treatments: surgery:Uncomplicated TVH/TVT/anterior repair and cystoscopy   Discharge Exam: Blood pressure (!) 121/43, pulse (!) 55, temperature 98.2 F (36.8 C), resp. rate 18, height 5\' 2"  (1.575 m), weight 101.5 kg, SpO2 96 %.   Current Meds  Medication Sig  . amitriptyline (ELAVIL) 10 MG tablet Take 20 mg by mouth at bedtime.   Marland Kitchen amLODipine (NORVASC) 5 MG tablet Take 5 mg by mouth daily.   Marland Kitchen aspirin EC 81 MG tablet Take 81 mg by mouth every evening.  . carvedilol (COREG) 6.25 MG tablet Take 6.25 mg by mouth 2 (two) times daily.   Marland Kitchen CRANBERRY PO Take 1 tablet by mouth at bedtime.  . DULoxetine (CYMBALTA) 60 MG capsule Take 60 mg by mouth daily.  . fluticasone (FLONASE) 50 MCG/ACT nasal spray Place 1-2 sprays into the nose daily as needed for allergies.   Marland Kitchen gabapentin (NEURONTIN) 400 MG capsule Take 1 capsule (400 mg total) by mouth 3 (three) times daily. (Patient taking differently: Take 400-800 mg by mouth See admin instructions. Take 400 mg in the morning, 400 mg at lunch, and 800 mg at supper)  . HYDROcodone-acetaminophen (NORCO/VICODIN) 5-325 MG tablet Take 2 tablets by mouth every 4 (four) hours as needed. (Patient taking differently: Take 0.5 tablets by mouth daily as needed for moderate pain. )  . LEVEMIR FLEXTOUCH 100 UNIT/ML Pen Inject 53 Units into the skin every morning.   Marland Kitchen levothyroxine (SYNTHROID) 175 MCG tablet Take 175 mcg by mouth daily before breakfast.  . losartan-hydrochlorothiazide (HYZAAR) 100-25 MG per tablet Take 1 tablet by mouth  daily. For Hypertension  . Melatonin 3 MG CAPS Take 3 mg by mouth at bedtime.   . mometasone (ELOCON) 0.1 % cream Apply 1 application topically daily as needed (rash).  . Multiple Vitamin (MULTIVITAMIN) tablet Take 1 tablet by mouth every evening.   . mupirocin ointment (BACTROBAN) 2 % Apply 1 application topically daily as needed (wound care).   . polyethylene glycol (MIRALAX / GLYCOLAX) packet Take 17 g by mouth daily as needed for mild constipation.   . Polyvinyl Alcohol-Povidone (REFRESH OP) Place 1 drop into both eyes daily as needed (dry eyes).  . pravastatin (PRAVACHOL) 80 MG tablet Take 80 mg by mouth daily.  . Probiotic Product (PROBIOTIC DAILY PO) Take 1 capsule by mouth daily.  Marland Kitchen tiZANidine (ZANAFLEX) 4 MG capsule Take 4 mg by mouth at bedtime as needed for muscle spasms.   . traMADol (ULTRAM) 50 MG tablet Take 1 tablet (50 mg total) by mouth every 6 (six) hours as needed. (Patient taking differently: Take 50 mg by mouth 2 (two) times daily as needed for moderate pain. )  . Vitamin D, Ergocalciferol, (DRISDOL) 50000 UNITS CAPS Take 50,000 Units by mouth every Wednesday.     Disposition: Discharge disposition: 01-Home or Self Care       Discharge Instructions    Call MD for:  temperature >100.4   Complete by: As directed    Diet - low sodium heart healthy   Complete by: As directed    Discharge instructions   Complete by: As directed    No driving on narcotics, no sexual  activity for 2 weeks.   Increase activity slowly   Complete by: As directed    May shower / Bathe   Complete by: As directed    Shower, no bath for 2 weeks.   Remove dressing in 24 hours   Complete by: As directed    Sexual Activity Restrictions   Complete by: As directed    No sexual activity for 2 weeks.       Follow-up Information    Abigail Charleston, MD Follow up in 2 week(s).   Specialty: Obstetrics and Gynecology Contact information: [address] [place]  [address] [phone]               Signed: Daria [name] 02/27/2020, 7:54 AM

## 2020-02-26 NOTE — Transfer of Care (Signed)
Immediate Anesthesia Transfer of Care Note  Patient: Abigail [name]  Procedure(s) Performed: HYSTERECTOMY VAGINAL (N/A Vagina ) TRANSVAGINAL TAPE (TVT) PROCEDURE (N/A Vagina ) ANTERIOR REPAIR (CYSTOCELE) (N/A Vagina ) CYSTOSCOPY (N/A Bladder)  Patient Location: PACU  Anesthesia Type:General  Level of Consciousness: awake, alert , oriented and patient cooperative  Airway & Oxygen Therapy: Patient Spontanous Breathing and Patient connected to nasal cannula oxygen  Post-op Assessment: Report given to RN, Post -op Vital signs reviewed and stable and Patient moving all extremities X 4  Post vital signs: Reviewed and stable  Last Vitals:  Vitals Value Taken Time  BP    Temp    Pulse 64 02/26/20 1210  Resp 13 02/26/20 1210  SpO2 96 % 02/26/20 1210  Vitals shown include unvalidated device data.  Last Pain:  Vitals:   02/26/20 0759  TempSrc: Oral  PainSc: 4       Patients Stated Pain Goal: 4 (65/99/35 7017)  Complications: No complications documented.

## 2020-02-26 NOTE — Brief Op Note (Signed)
02/26/2020  11:59 AM  PATIENT:  Abigail [name]  79 y.o. female  PRE-OPERATIVE DIAGNOSIS:  Incomplete prolapse; incontinence; frequency  POST-OPERATIVE DIAGNOSIS:  Incomplete prolapse; incontinence; frequen  PROCEDURE:  Procedure(s): HYSTERECTOMY VAGINAL (N/A) TRANSVAGINAL TAPE (TVT) PROCEDURE (N/A) ANTERIOR REPAIR (CYSTOCELE) (N/A) CYSTOSCOPY (N/A)  SURGEON:  Surgeon(s) and Role:    * Bobbye Charleston, MD - Primary    * Taam-Akelman, Lawrence Santiago, MD - Assisting  ANESTHESIA:   general  EBL:  200 mL   LOCAL MEDICATIONS USED:  LIDOCAINE with epi, estrace cream and surgiflo  SPECIMEN:  Source of Specimen:  uterus, cervix  DISPOSITION OF SPECIMEN:  PATHOLOGY  COUNTS:  YES  TOURNIQUET:  * No tourniquets in log *  DICTATION: .Note written in EPIC  PLAN OF CARE: Admit for overnight observation  PATIENT DISPOSITION:  PACU - hemodynamically stable.   Delay start of Pharmacological VTE agent (>24hrs) due to surgical blood loss or risk of bleeding: not applicable

## 2020-02-26 NOTE — Anesthesia Procedure Notes (Signed)
Procedure Name: LMA Insertion Date/Time: 02/26/2020 9:30 AM Performed by: Rogers Blocker, CRNA Pre-anesthesia Checklist: Patient identified, Emergency Drugs available, Suction available and Patient being monitored Patient Re-evaluated:Patient Re-evaluated prior to induction Oxygen Delivery Method: Circle System Utilized Preoxygenation: Pre-oxygenation with 100% oxygen Induction Type: IV induction Ventilation: Mask ventilation without difficulty LMA: LMA inserted LMA Size: 4.0 Number of attempts: 1 Airway Equipment and Method: Bite block Placement Confirmation: positive ETCO2 Tube secured with: Tape Dental Injury: Teeth and Oropharynx as per pre-operative assessment

## 2020-02-26 NOTE — Op Note (Signed)
02/26/2020  11:59 AM  PATIENT:  Abigail [name]  79 y.o. female  PRE-OPERATIVE DIAGNOSIS:  Incomplete prolapse; incontinence; frequency  POST-OPERATIVE DIAGNOSIS:  Incomplete prolapse; incontinence; frequen  PROCEDURE:  Procedure(s): HYSTERECTOMY VAGINAL (N/A) TRANSVAGINAL TAPE (TVT) PROCEDURE (N/A) ANTERIOR REPAIR (CYSTOCELE) (N/A) CYSTOSCOPY (N/A)  SURGEON:  Surgeon(s) and Role:    * Bobbye Charleston, MD - Primary    * Taam-Akelman, Lawrence Santiago, MD - Assisting  ANESTHESIA:   general  EBL:  200 mL   LOCAL MEDICATIONS USED:  LIDOCAINE with epi, estrace cream and surgiflo  SPECIMEN:  Source of Specimen:  uterus, cervix  DISPOSITION OF SPECIMEN:  PATHOLOGY  COUNTS:  YES  TOURNIQUET:  * No tourniquets in log *  DICTATION: .Note written in EPIC  PLAN OF CARE: Admit for overnight observation  PATIENT DISPOSITION:  PACU - hemodynamically stable.   Delay start of Pharmacological VTE agent (>24hrs) due to surgical blood loss or risk of bleeding: not applicable   Findings: cystocele to +1, 7 week size uterus and normal ovaries.  Prolapse to the introitus.  Technique:  After general anesthesia was achieved, the patient was prepped and draped in a sterile fashion.  The bladder was emptied with a foley. The cervix was grasped with a pair of Lahey clamps and injected circumferentially with lidocaine with epi. A circumferential incision was made around the cervix with the scalpel at the level of the reflection of the vagina onto the cervix and the posterior cul-de-sac was entered into with Mayo scissors. The long billed duckbill retractor was then placed and the bladder was removed off the cervix carefully with sharp dissection with the Metzenbaums. The the uterosacrals were grasped with a pair heney clamps on either side and secured with a Heaney stitch of 0 Vicryl. Cardinal ligament was then divided with alternating successive bites of the Heaney clamp followed by incision with the  Mayo scissors and secured with stitches of 0 Vicryl at the level of the cornua Heaneys were placed bilaterally around the entire pedicle and the uterus was able to be amputated.  The pedicles were secured with a free tie of 0 vicryl.  The ovaries and tubes on each side were successively brought down with a babcock and the IP ligaments identified.  Each IP was then clamped with a heaney close to the ovary.  The pedicles were each secured with a free hand stitch of 0 Vicryl followed by a stitch of 0 Vicryl bilaterally. The tubes were not easily moved into the surgical field and therefore left in place. Once hemostasis was achieved the peritoneum was closed in a pursestring fashion including the bilateral uterosacrals and posteriorly in and out of the vagina and a partial Halbans culdoplasty.  The stitch was pulled taut closing the peritoneum and pulling the uterosacrals together through the modified Halbans and through the vagina.   The anterior vaginal mucosa was then entered into in the midline with the help of Alice's after being injected with 0.5% marcaine with epi with the scalpel. The vaginal mucosa was then reflected off of the vesicouterine fascia with careful sharp dissection with the Metzenbaums until the pelvic floor could feel be felt underneath the pubic bone. 2 small stab incisions are made on 2 cm either side of the midline just above the pubic bone. The abdominal needles were placed carefully through the pelvic floor and out the vagina. The Foley was removed and the cystoscope placed inside the bladder; there were no needles in the bladder, there was  good spillage of indigo carmine from the bilateral ureteral orifices and the trigone and urethra were clear.  The foley was replaced. The abdominal needles were attached the vaginal needles and the tape pulled through and cut into place. A Claiborne Billings was used to ensure that the there was no tension placed on the tape. Once I was satisfied that the sling was  in the correct place and at no tension, the anterior repair was done with one mattress stitches of 0 Vicryl. Hemostasis was achieved with surgiflo. The vaginal mucosa was trimmed and closed with a running locked stitch of 2-0 Vicryl. The cuff was then closed with interrupted figure-of-eight stitches of 0 Vicryl. A vaginal pack was placed inside the vagina with Estrace cream and the patient tolerated the procedure was returned to recovery in stable condition.   Alphia Behanna A

## 2020-02-26 NOTE — Progress Notes (Signed)
Pt complaining of burning sensation to tongue and mouth and lips after eating dinner. Small amount of blood noted in mouth, but no abrasions found, petechia noted to hard palate, poor dentation with areas of bleeding noted, pt denies sore throat. Dr. Glennon Mac notified and discussed possible causes. Orders received for magic mouthwash. If oral injury is cause could take 7-10 days to heal. Will stick with soft foods and avoid acidic foods for now.

## 2020-02-26 NOTE — Anesthesia Postprocedure Evaluation (Signed)
Anesthesia Post Note  Patient: Abigail [name]  Procedure(s) Performed: HYSTERECTOMY VAGINAL (N/A Vagina ) TRANSVAGINAL TAPE (TVT) PROCEDURE (N/A Vagina ) ANTERIOR REPAIR (CYSTOCELE) (N/A Vagina ) CYSTOSCOPY (N/A Bladder)     Patient location during evaluation: PACU Anesthesia Type: General Level of consciousness: awake Pain management: pain level controlled Vital Signs Assessment: post-procedure vital signs reviewed and stable Respiratory status: spontaneous breathing, nonlabored ventilation, respiratory function stable and patient connected to nasal cannula oxygen Cardiovascular status: blood pressure returned to baseline and stable Postop Assessment: no apparent nausea or vomiting Anesthetic complications: no   No complications documented.  Last Vitals:  Vitals:   02/26/20 1326 02/26/20 1351  BP: (!) 137/48 131/65  Pulse: 61 67  Resp: 16 16  Temp: 36.9 C 36.8 C  SpO2: 96% 94%    Last Pain:  Vitals:   02/26/20 1351  TempSrc:   PainSc: 5                  Toshio Slusher P Obi Scrima

## 2020-02-27 ENCOUNTER — Encounter (HOSPITAL_BASED_OUTPATIENT_CLINIC_OR_DEPARTMENT_OTHER): Payer: Self-pay | Admitting: Obstetrics and Gynecology

## 2020-02-27 DIAGNOSIS — N72 Inflammatory disease of cervix uteri: Secondary | ICD-10-CM | POA: Diagnosis not present

## 2020-02-27 DIAGNOSIS — I129 Hypertensive chronic kidney disease with stage 1 through stage 4 chronic kidney disease, or unspecified chronic kidney disease: Secondary | ICD-10-CM | POA: Diagnosis not present

## 2020-02-27 DIAGNOSIS — N393 Stress incontinence (female) (male): Secondary | ICD-10-CM | POA: Diagnosis not present

## 2020-02-27 DIAGNOSIS — N814 Uterovaginal prolapse, unspecified: Secondary | ICD-10-CM | POA: Diagnosis not present

## 2020-02-27 DIAGNOSIS — N189 Chronic kidney disease, unspecified: Secondary | ICD-10-CM | POA: Diagnosis not present

## 2020-02-27 DIAGNOSIS — E1122 Type 2 diabetes mellitus with diabetic chronic kidney disease: Secondary | ICD-10-CM | POA: Diagnosis not present

## 2020-02-27 LAB — GLUCOSE, CAPILLARY
Glucose-Capillary: 120 mg/dL — ABNORMAL HIGH (ref 70–99)
Glucose-Capillary: 90 mg/dL (ref 70–99)
Glucose-Capillary: 120 mg/dL — ABNORMAL HIGH (ref 70–99)

## 2020-02-27 LAB — SURGICAL PATHOLOGY

## 2020-02-27 MED ORDER — MAGIC MOUTHWASH W/LIDOCAINE: 10.0000 mL | Freq: Four times a day (QID) | ORAL | 2 refills | Status: DC | PRN

## 2020-02-27 MED ORDER — GABAPENTIN 100 MG PO CAPS
ORAL_CAPSULE | ORAL | Status: AC
  Filled 2020-02-27: qty 4

## 2020-02-27 MED ORDER — HYDROCODONE-ACETAMINOPHEN 5-325 MG PO TABS
ORAL_TABLET | ORAL | Status: AC
  Filled 2020-02-27: qty 2

## 2020-02-27 MED ORDER — HYDROCODONE-ACETAMINOPHEN 5-325 MG PO TABS: 1.0000 | ORAL_TABLET | ORAL | 0 refills | Status: DC | PRN

## 2020-02-27 NOTE — Progress Notes (Addendum)
Doctor removed vaginal packing this AM. Doctor gave orders to instill 200 cc of NS into the bladder and then remove catheter and do a voiding trial. Catheter removed at 815. Waiting on patient to urinate

## 2020-02-27 NOTE — Discharge Instructions (Signed)
Anterior and Posterior Colporrhaphy and Sling Procedure, Care After This sheet gives you information about how to care for yourself after your procedure. Your health care provider may also give you more specific instructions. If you have problems or questions, contact your health care provider. What can I expect after the procedure? After the procedure, it is common to have:  Pain in the surgical area.  Vaginal discharge. You will need to use a sanitary pad during this time.  Fatigue. Follow these instructions at home: Incision care   Follow instructions from your health care provider about how to take care of your incision. Make sure you: ? Wash your hands with soap and water before touching the incision area. If soap and water are not available, use hand sanitizer. ? Clean your incision as told by your health care provider. ? Leave stitches (sutures), skin glue, or adhesive strips in place. These skin closures may need to stay in place for 2 weeks or longer. If adhesive strip edges start to loosen and curl up, you may trim the loose edges. Do not remove adhesive strips completely unless your health care provider tells you to do that.  Check your incision area every day for signs of infection. Check for: ? Redness, swelling, or pain. ? Fluid or blood. ? Warmth. ? Pus or a bad smell.  Check your incision every day to make sure the incision area is not separating or opening.  Do not take baths, swim, or use a hot tub until your health care provider approves. You may shower.  Keep the area between your vagina and rectum (perineal area) clean and dry. Make sure you clean the area after each bowel movement and each time you urinate.  Ask your health care provider if you can take a sitz bath or sit in a tub of clean, warm water. Activity  Do gentle, daily activity as told by your health care provider. You may be told to take short walks every day and go farther each time. Ask your health  care provider what activities are safe for you.  Limit stair climbing to once or twice a day in the first week, then slowly increase this activity.  Do not lift anything that is heavier than 10 lbs. (4.5 kg), or the limit that your health care provider tells you, until he or she says that it is safe. Avoid pushing or pulling motions.  Avoid standing for long periods of time.  Do not douche, use tampons, or have sex until your health care provider says it is okay.  Do not drive or use heavy machinery while taking prescription pain medicine. To prevent constipation  To prevent or treat constipation while you are taking prescription pain medicine, your health care provider may recommend that you: ? Take over-the-counter or prescription medicines. ? Eat foods that are high in fiber, such as fresh fruits and vegetables, whole grains, and beans. ? Drink enough fluid to keep your urine clear or pale yellow. ? Limit foods that are high in fat and processed sugars, such as fried and sweet foods. General instructions  You may be instructed to do pelvic floor exercises (kegels) as told by your health care provider.  Take over-the-counter and prescription medicines only as told by your health care provider.  Keep all follow-up visits as told by your health care provider. This is important. Contact a health care provider if:  Medicine does not help your pain.  You have frequent or urgent urination, or  you are unable to completely empty your bladder.  You feel a burning sensation when urinating.  You have fluid or blood coming from your incision.  You have pus or a bad smell coming from the incision.  Your incision feels warm to the touch.  You have redness, swelling, or pain around your incision. Get help right away if:  You have a fever or chills.  Your incision separates or opens.  You cannot urinate.  You have trouble breathing. Summary  After the procedure, it is common to  have pain, fatigue, and discharge from the vagina.  Keep the area between your vagina and rectum (perineal area) clean and dry. Make sure you clean the area after each bowel movement and each time you urinate.  Follow instructions from your health care provider on any activity restrictions after the procedure. This information is not intended to replace advice given to you by your health care provider. Make sure you discuss any questions you have with your health care provider. Document Revised: 04/28/2017 Document Reviewed: 05/16/2016 Elsevier Patient Education  [place]. Vaginal Hysterectomy, Care After Refer to this sheet in the next few weeks. These instructions provide you with information about caring for yourself after your procedure. Your health care provider may also give you more specific instructions. Your treatment has been planned according to current medical practices, but problems sometimes occur. Call your health care provider if you have any problems or questions after your procedure. What can I expect after the procedure? After the procedure, it is common to have:  Pain.  Soreness and numbness in your incision areas.  Vaginal bleeding and discharge.  Constipation.  Temporary problems emptying the bladder.  Feelings of sadness or other emotions. Follow these instructions at home: Medicines  Take over-the-counter and prescription medicines only as told by your health care provider.  If you were prescribed an antibiotic medicine, take it as told by your health care provider. Do not stop taking the antibiotic even if you start to feel better.  Do not drive or operate heavy machinery while taking prescription pain medicine. Activity  Return to your normal activities as told by your health care provider. Ask your health care provider what activities are safe for you.  Get regular exercise as told by your health care provider. You may be told to take short  walks every day and go farther each time.  Do not lift anything that is heavier than 10 lb (4.5 kg). General instructions   Do not put anything in your vagina for 6 weeks after your surgery or as told by your health care provider. This includes tampons and douches.  Do not have sex until your health care provider says you can.  Do not take baths, swim, or use a hot tub until your health care provider approves.  Drink enough fluid to keep your urine clear or pale yellow.  Do not drive for 24 hours if you were given a sedative.  Keep all follow-up visits as told by your health care provider. This is important. Contact a health care provider if:  Your pain medicine is not helping.  You have a fever.  You have redness, swelling, or pain at your incision site.  You have blood, pus, or a bad-smelling discharge from your vagina.  You continue to have difficulty urinating. Get help right away if:  You have severe abdominal or back pain.  You have heavy bleeding from your vagina.  You have chest pain  or shortness of breath. This information is not intended to replace advice given to you by your health care provider. Make sure you discuss any questions you have with your health care provider. Document Revised: 01/07/2016 Document Reviewed: 05/31/2015 Elsevier Patient Education  2020 [employer].

## 2020-02-27 NOTE — Progress Notes (Signed)
Patient DTV. Attempted to void without success after ambulation. Dr. Philis Pique notified, orders given to insert foley catheter if unable to void by 11:00 am today and D/C home with foley catheter. Also notified MD of patient's request for Rx for magic mouthwash with lidocaine. Will continue to monitor.

## 2020-02-27 NOTE — Progress Notes (Signed)
Pt voided 120 ml pink urine. Bladder scan performed, revealed 277 ml post void urine retention. Paged Dr. Philis Pique, waiting for response.

## 2020-02-27 NOTE — Progress Notes (Signed)
Patient is eating, ambulating, foley in for voiding.  Pain control is good.  Vitals:   02/26/20 1844 02/26/20 2115 02/27/20 0200 02/27/20 0615  BP: (!) 146/64 (!) 125/48 (!) 120/38 (!) 121/43  Pulse: 70 65 (!) 56 (!) 55  Resp: 18 20 18 18   Temp: 97.7 F (36.5 C) 99.3 F (37.4 C) 98.7 F (37.1 C) 98.2 F (36.8 C)  TempSrc:      SpO2: 96% 96% 93% 96%  Weight:      Height:        lungs:   clear to auscultation cor:    RRR Abdomen:  soft, appropriate tenderness, incisions intact and without erythema or exudate. ex:    no cords   Lab Results  Component Value Date   WBC 8.6 02/19/2020   HGB 13.4 02/19/2020   HCT 39.9 02/19/2020   MCV 90.7 02/19/2020   PLT 268 02/19/2020    A/P  Routine care.  Expect d/c per plan.  Vaginal pack removed.  Voiding trial before d/c.

## 2020-03-03 ENCOUNTER — Other Ambulatory Visit (HOSPITAL_COMMUNITY): Payer: Self-pay | Admitting: Obstetrics and Gynecology

## 2020-03-03 DIAGNOSIS — R309 Painful micturition, unspecified: Secondary | ICD-10-CM | POA: Diagnosis not present

## 2020-03-03 DIAGNOSIS — M79604 Pain in right leg: Secondary | ICD-10-CM

## 2020-03-03 DIAGNOSIS — Z9889 Other specified postprocedural states: Secondary | ICD-10-CM

## 2020-03-04 ENCOUNTER — Other Ambulatory Visit: Payer: Self-pay

## 2020-03-04 ENCOUNTER — Ambulatory Visit (HOSPITAL_COMMUNITY)
Admission: RE | Admit: 2020-03-04 | Discharge: 2020-03-04 | Disposition: A | Discharge status: Home / Self Care | Payer: Medicare Other | Source: Ambulatory Visit | Attending: Obstetrics and Gynecology | Admitting: Obstetrics and Gynecology

## 2020-03-04 DIAGNOSIS — M79604 Pain in right leg: Secondary | ICD-10-CM | POA: Diagnosis not present

## 2020-03-04 DIAGNOSIS — Z9889 Other specified postprocedural states: Secondary | ICD-10-CM | POA: Diagnosis not present

## 2020-03-12 DIAGNOSIS — N760 Acute vaginitis: Secondary | ICD-10-CM | POA: Diagnosis not present

## 2020-03-16 DIAGNOSIS — R197 Diarrhea, unspecified: Secondary | ICD-10-CM | POA: Diagnosis not present

## 2020-03-18 DIAGNOSIS — T148XXD Other injury of unspecified body region, subsequent encounter: Secondary | ICD-10-CM | POA: Diagnosis not present

## 2020-03-19 DIAGNOSIS — R634 Abnormal weight loss: Secondary | ICD-10-CM | POA: Diagnosis not present

## 2020-03-19 DIAGNOSIS — R197 Diarrhea, unspecified: Secondary | ICD-10-CM | POA: Diagnosis not present

## 2020-03-19 DIAGNOSIS — E1140 Type 2 diabetes mellitus with diabetic neuropathy, unspecified: Secondary | ICD-10-CM | POA: Diagnosis not present

## 2020-03-19 DIAGNOSIS — Z794 Long term (current) use of insulin: Secondary | ICD-10-CM | POA: Diagnosis not present

## 2020-04-07 ENCOUNTER — Other Ambulatory Visit: Payer: Self-pay | Admitting: Physician Assistant

## 2020-04-07 DIAGNOSIS — R1032 Left lower quadrant pain: Secondary | ICD-10-CM | POA: Diagnosis not present

## 2020-04-07 DIAGNOSIS — N760 Acute vaginitis: Secondary | ICD-10-CM | POA: Diagnosis not present

## 2020-04-07 DIAGNOSIS — K5900 Constipation, unspecified: Secondary | ICD-10-CM | POA: Diagnosis not present

## 2020-04-15 DIAGNOSIS — L293 Anogenital pruritus, unspecified: Secondary | ICD-10-CM | POA: Diagnosis not present

## 2020-04-15 DIAGNOSIS — R300 Dysuria: Secondary | ICD-10-CM | POA: Diagnosis not present

## 2020-04-27 ENCOUNTER — Ambulatory Visit
Admission: RE | Admit: 2020-04-27 | Discharge: 2020-04-27 | Disposition: A | Discharge status: Home / Self Care | Payer: Medicare Other | Source: Ambulatory Visit | Attending: Physician Assistant | Admitting: Physician Assistant

## 2020-04-27 DIAGNOSIS — Z981 Arthrodesis status: Secondary | ICD-10-CM | POA: Diagnosis not present

## 2020-04-27 DIAGNOSIS — N281 Cyst of kidney, acquired: Secondary | ICD-10-CM | POA: Diagnosis not present

## 2020-04-27 DIAGNOSIS — I700 Atherosclerosis of aorta: Secondary | ICD-10-CM | POA: Diagnosis not present

## 2020-04-27 DIAGNOSIS — E876 Hypokalemia: Secondary | ICD-10-CM | POA: Diagnosis not present

## 2020-04-27 DIAGNOSIS — K7689 Other specified diseases of liver: Secondary | ICD-10-CM | POA: Diagnosis not present

## 2020-04-27 DIAGNOSIS — R1032 Left lower quadrant pain: Secondary | ICD-10-CM

## 2020-04-27 MED ORDER — IOPAMIDOL (ISOVUE-300) INJECTION 61%
100.0000 mL | Freq: Once | INTRAVENOUS | Status: AC | PRN
  Administered 2020-04-27: 100 mL via INTRAVENOUS

## 2020-05-01 DIAGNOSIS — M25562 Pain in left knee: Secondary | ICD-10-CM | POA: Diagnosis not present

## 2020-05-07 DIAGNOSIS — Z981 Arthrodesis status: Secondary | ICD-10-CM | POA: Diagnosis not present

## 2020-05-07 DIAGNOSIS — G63 Polyneuropathy in diseases classified elsewhere: Secondary | ICD-10-CM | POA: Diagnosis not present

## 2020-05-07 DIAGNOSIS — M5417 Radiculopathy, lumbosacral region: Secondary | ICD-10-CM | POA: Diagnosis not present

## 2020-05-18 DIAGNOSIS — M1990 Unspecified osteoarthritis, unspecified site: Secondary | ICD-10-CM | POA: Diagnosis not present

## 2020-05-18 DIAGNOSIS — E1140 Type 2 diabetes mellitus with diabetic neuropathy, unspecified: Secondary | ICD-10-CM | POA: Diagnosis not present

## 2020-05-18 DIAGNOSIS — N183 Chronic kidney disease, stage 3 unspecified: Secondary | ICD-10-CM | POA: Diagnosis not present

## 2020-05-18 DIAGNOSIS — D510 Vitamin B12 deficiency anemia due to intrinsic factor deficiency: Secondary | ICD-10-CM | POA: Diagnosis not present

## 2020-05-18 DIAGNOSIS — G4700 Insomnia, unspecified: Secondary | ICD-10-CM | POA: Diagnosis not present

## 2020-05-18 DIAGNOSIS — E782 Mixed hyperlipidemia: Secondary | ICD-10-CM | POA: Diagnosis not present

## 2020-05-18 DIAGNOSIS — I10 Essential (primary) hypertension: Secondary | ICD-10-CM | POA: Diagnosis not present

## 2020-05-18 DIAGNOSIS — M25512 Pain in left shoulder: Secondary | ICD-10-CM | POA: Diagnosis not present

## 2020-05-18 DIAGNOSIS — E039 Hypothyroidism, unspecified: Secondary | ICD-10-CM | POA: Diagnosis not present

## 2020-05-18 DIAGNOSIS — M179 Osteoarthritis of knee, unspecified: Secondary | ICD-10-CM | POA: Diagnosis not present

## 2020-05-28 DIAGNOSIS — Z23 Encounter for immunization: Secondary | ICD-10-CM | POA: Diagnosis not present

## 2020-06-03 DIAGNOSIS — E876 Hypokalemia: Secondary | ICD-10-CM | POA: Diagnosis not present

## 2020-06-03 DIAGNOSIS — R350 Frequency of micturition: Secondary | ICD-10-CM | POA: Diagnosis not present

## 2020-06-03 DIAGNOSIS — E1140 Type 2 diabetes mellitus with diabetic neuropathy, unspecified: Secondary | ICD-10-CM | POA: Diagnosis not present

## 2020-06-03 DIAGNOSIS — E611 Iron deficiency: Secondary | ICD-10-CM | POA: Diagnosis not present

## 2020-06-03 DIAGNOSIS — E039 Hypothyroidism, unspecified: Secondary | ICD-10-CM | POA: Diagnosis not present

## 2020-06-05 DIAGNOSIS — I10 Essential (primary) hypertension: Secondary | ICD-10-CM | POA: Diagnosis not present

## 2020-06-05 DIAGNOSIS — D510 Vitamin B12 deficiency anemia due to intrinsic factor deficiency: Secondary | ICD-10-CM | POA: Diagnosis not present

## 2020-06-05 DIAGNOSIS — N183 Chronic kidney disease, stage 3 unspecified: Secondary | ICD-10-CM | POA: Diagnosis not present

## 2020-06-05 DIAGNOSIS — E782 Mixed hyperlipidemia: Secondary | ICD-10-CM | POA: Diagnosis not present

## 2020-06-05 DIAGNOSIS — D500 Iron deficiency anemia secondary to blood loss (chronic): Secondary | ICD-10-CM | POA: Diagnosis not present

## 2020-06-05 DIAGNOSIS — E1140 Type 2 diabetes mellitus with diabetic neuropathy, unspecified: Secondary | ICD-10-CM | POA: Diagnosis not present

## 2020-06-05 DIAGNOSIS — M179 Osteoarthritis of knee, unspecified: Secondary | ICD-10-CM | POA: Diagnosis not present

## 2020-06-05 DIAGNOSIS — M1990 Unspecified osteoarthritis, unspecified site: Secondary | ICD-10-CM | POA: Diagnosis not present

## 2020-06-05 DIAGNOSIS — G4700 Insomnia, unspecified: Secondary | ICD-10-CM | POA: Diagnosis not present

## 2020-06-05 DIAGNOSIS — E039 Hypothyroidism, unspecified: Secondary | ICD-10-CM | POA: Diagnosis not present

## 2020-06-29 DIAGNOSIS — D500 Iron deficiency anemia secondary to blood loss (chronic): Secondary | ICD-10-CM | POA: Diagnosis not present

## 2020-06-29 DIAGNOSIS — K5901 Slow transit constipation: Secondary | ICD-10-CM | POA: Diagnosis not present

## 2020-07-21 DIAGNOSIS — N760 Acute vaginitis: Secondary | ICD-10-CM | POA: Diagnosis not present

## 2020-07-21 DIAGNOSIS — N3281 Overactive bladder: Secondary | ICD-10-CM | POA: Diagnosis not present

## 2020-07-21 DIAGNOSIS — N3941 Urge incontinence: Secondary | ICD-10-CM | POA: Diagnosis not present

## 2020-08-07 DIAGNOSIS — J342 Deviated nasal septum: Secondary | ICD-10-CM | POA: Diagnosis not present

## 2020-08-07 DIAGNOSIS — J3489 Other specified disorders of nose and nasal sinuses: Secondary | ICD-10-CM | POA: Diagnosis not present

## 2020-08-07 DIAGNOSIS — K115 Sialolithiasis: Secondary | ICD-10-CM | POA: Diagnosis not present

## 2020-08-07 DIAGNOSIS — Z9009 Acquired absence of other part of head and neck: Secondary | ICD-10-CM | POA: Diagnosis not present

## 2020-08-07 DIAGNOSIS — H90A32 Mixed conductive and sensorineural hearing loss, unilateral, left ear with restricted hearing on the contralateral side: Secondary | ICD-10-CM | POA: Diagnosis not present

## 2020-08-07 DIAGNOSIS — R0982 Postnasal drip: Secondary | ICD-10-CM | POA: Diagnosis not present

## 2020-08-07 DIAGNOSIS — R682 Dry mouth, unspecified: Secondary | ICD-10-CM | POA: Diagnosis not present

## 2020-08-07 DIAGNOSIS — H9203 Otalgia, bilateral: Secondary | ICD-10-CM | POA: Diagnosis not present

## 2020-08-07 DIAGNOSIS — R0981 Nasal congestion: Secondary | ICD-10-CM | POA: Diagnosis not present

## 2020-08-07 DIAGNOSIS — R519 Headache, unspecified: Secondary | ICD-10-CM | POA: Diagnosis not present

## 2020-08-07 DIAGNOSIS — R221 Localized swelling, mass and lump, neck: Secondary | ICD-10-CM | POA: Diagnosis not present

## 2020-08-07 DIAGNOSIS — K0889 Other specified disorders of teeth and supporting structures: Secondary | ICD-10-CM | POA: Diagnosis not present

## 2020-08-07 DIAGNOSIS — H90A21 Sensorineural hearing loss, unilateral, right ear, with restricted hearing on the contralateral side: Secondary | ICD-10-CM | POA: Diagnosis not present

## 2020-08-12 ENCOUNTER — Emergency Department (INDEPENDENT_AMBULATORY_CARE_PROVIDER_SITE_OTHER)
Admission: RE | Admit: 2020-08-12 | Discharge: 2020-08-12 | Disposition: A | Discharge status: Home / Self Care | Payer: Medicare Other | Source: Ambulatory Visit | Attending: Family Medicine | Admitting: Family Medicine

## 2020-08-12 ENCOUNTER — Emergency Department (INDEPENDENT_AMBULATORY_CARE_PROVIDER_SITE_OTHER): Payer: Medicare Other

## 2020-08-12 ENCOUNTER — Other Ambulatory Visit: Payer: Self-pay

## 2020-08-12 VITALS — BP 126/72 | HR 63 | Temp 98.3°F | Resp 18 | Ht 62.0 in | Wt 208.0 lb

## 2020-08-12 DIAGNOSIS — R0781 Pleurodynia: Secondary | ICD-10-CM

## 2020-08-12 DIAGNOSIS — R3129 Other microscopic hematuria: Secondary | ICD-10-CM

## 2020-08-12 DIAGNOSIS — R1012 Left upper quadrant pain: Secondary | ICD-10-CM | POA: Diagnosis not present

## 2020-08-12 DIAGNOSIS — M940 Chondrocostal junction syndrome [Tietze]: Secondary | ICD-10-CM

## 2020-08-12 DIAGNOSIS — R109 Unspecified abdominal pain: Secondary | ICD-10-CM | POA: Diagnosis not present

## 2020-08-12 LAB — POCT URINALYSIS DIP (MANUAL ENTRY)
Bilirubin, UA: NEGATIVE
Glucose, UA: NEGATIVE mg/dL
Ketones, POC UA: NEGATIVE mg/dL
Nitrite, UA: NEGATIVE
Protein Ur, POC: 100 mg/dL — AB
Spec Grav, UA: 1.02 (ref 1.010–1.025)
Urobilinogen, UA: 0.2 E.U./dL
pH, UA: 6 (ref 5.0–8.0)

## 2020-08-12 MED ORDER — PREDNISONE 10 MG PO TABS: ORAL_TABLET | ORAL | 0 refills | Status: DC

## 2020-08-12 MED ORDER — KETOROLAC TROMETHAMINE 30 MG/ML IJ SOLN
30.0000 mg | Freq: Once | INTRAMUSCULAR | Status: AC
  Administered 2020-08-12: 30 mg via INTRAMUSCULAR

## 2020-08-12 NOTE — Discharge Instructions (Addendum)
Home to rest Take the prednisone as prescribed Take one dose today The low doses should not cause jittery feelings or insomnia Take your pain medication and tizanidine as needed Try a heating pad to chest on low heat See your doctor if not better in a few days

## 2020-08-12 NOTE — ED Triage Notes (Signed)
Pt mentioned she takes nitrofurantoin once daily for frequent UTIs

## 2020-08-12 NOTE — ED Triage Notes (Signed)
Pt had a fall ~ 3 weeks ago (fell on right side no loc). Pt now presents with Left abdominal pain extending to left flank and pain. Rates pain 3/10 sharp and nagging. Pt has had tylenol for pain intermittently. Of note, pt has a contract with pain clinic for chronic back pain.

## 2020-08-13 NOTE — ED Provider Notes (Signed)
[facility]    CSN: [id number] Arrival date & time: 08/12/20  1246      History   Chief Complaint Chief Complaint  Patient presents with  . Back Pain    HPI Abigail [name] is a 80 y.o. female.   HPI  Patient states that she did have a fall 2 weeks ago, she fell towards her right and landed sitting in a parking lot.  She states that she got up brushed off and continue shopping.  2 weeks later has developed this pain.  Patient thinks it is unrelated She has pain in her left rib cage that radiates around to her left back.  She points to the area under her breast.  She states that she has no shortness of breath, history of heart disease.  The pain is worse with certain movements.  Is not related to food.  She has taken some Tylenol for pain but this is not helped.  She is unable to take NSAID drugs because because of stage III kidney disease. Patient has widespread arthritis.  She is under contract with a chronic pain clinic and takes tramadol as needed, hydrocodone if pain is severe, and has a prescription for muscle relaxers that she takes infrequently at night  Past Medical History:  Diagnosis Date  . Arthritis    shoulder, knee,   . Boils    near vaginal area  . Chronic kidney disease    stage 3; low kidney function; BUN was high acc to pt  . Constipation due to pain medication   . Depression   . Diabetes mellitus    Type 2  . Family hx of colon cancer   . Fibromyalgia   . Frequency of urination   . Frequent urination at night   . GERD (gastroesophageal reflux disease)    pt denies  . Heart murmur   . History of bladder infections   . Hyperlipidemia   . Hypertension   . Hypothyroidism   . Itchy eyes   . Obesity   . Obesity   . PONV (postoperative nausea and vomiting)    with stapedectomy and   . Torn rotator cuff left   current    Patient Active Problem List   Diagnosis Date Noted  . Postoperative state 02/26/2020  . Furuncle of vulva  12/06/2016  . Bilateral carpal tunnel syndrome 11/23/2016  . Neck pain 11/23/2016  . Primary osteoarthritis of both first carpometacarpal joints 11/23/2016  . Diabetic nephropathy ([place]) 08/18/2016  . Right knee pain 01/03/2016  . Asymmetrical hearing loss of both ears 02/27/2015  . Pressure ulcer 11/10/2014  . Spondylolisthesis of lumbar region 11/07/2014  . CKD (chronic kidney disease), stage III ([place]) 07/24/2013  . Chest pain 07/23/2013  . Chronic pain 07/23/2013  . Fibromyalgia 07/23/2013  . Leukocytosis 02/07/2013  . Weakness generalized 02/07/2013  . Acute encephalopathy 02/05/2013  . Anemia 02/05/2013  . Acute renal failure ([place]) 02/05/2013  . Dehydration 09/12/2012  . Diabetes mellitus ([place]) 09/12/2012  . Hypertension 09/12/2012  . Hypothyroidism 09/12/2012  . Hyperlipidemia 09/12/2012    Past Surgical History:  Procedure Laterality Date  . BACK SURGERY     lumbar fusion  . BLADDER SUSPENSION N/A 02/26/2020   Procedure: TRANSVAGINAL TAPE (TVT) PROCEDURE;  Surgeon: Bobbye Charleston, MD;  Location: [facility];  Service: Gynecology;  Laterality: N/A;  . CARPAL TUNNEL RELEASE Right 06/29/2017   Procedure: RIGHT CARPAL TUNNEL RELEASE;  Surgeon: Daryll Brod, MD;  Location: [facility];  Service: Orthopedics;  Laterality: Right;  . CHOLECYSTECTOMY  1971  . COLONOSCOPY    . CYSTOCELE REPAIR N/A 02/26/2020   Procedure: ANTERIOR REPAIR (CYSTOCELE);  Surgeon: Bobbye Charleston, MD;  Location: [facility];  Service: Gynecology;  Laterality: N/A;  . CYSTOSCOPY N/A 02/26/2020   Procedure: CYSTOSCOPY;  Surgeon: Bobbye Charleston, MD;  Location[facility];  Service: Gynecology;  Laterality: N/A;  . DILATION AND CURETTAGE OF UTERUS    . EYE SURGERY Bilateral 2020  . MAXIMUM ACCESS (MAS)POSTERIOR LUMBAR INTERBODY FUSION (PLIF) 1 LEVEL N/A 11/07/2014   Procedure: Lumbar Four-Five Maximum access posterior lumbar interbody fusion;   Surgeon: Erline Levine, MD;  Location: [facility];  Service: Neurosurgery;  Laterality: N/A;  L4-5 Maximum access posterior lumbar interbody fusion  . SHOULDER SURGERY     bilateral rotator cuff repairs  . STAPEDECTOMY Left    ear  . TUBAL LIGATION  1971  . VAGINAL HYSTERECTOMY N/A 02/26/2020   Procedure: HYSTERECTOMY VAGINAL;  Surgeon: Bobbye Charleston, MD;  Location: [facility];  Service: Gynecology;  Laterality: N/A;    OB History   No obstetric history on file.      Home Medications    Prior to Admission medications   Medication Sig Start Date End Date Taking? Authorizing Provider  ipratropium-albuterol (DUONEB) 0.5-2.5 (3) MG/3ML SOLN Take 3 mLs by nebulization.   Yes [provider]  predniSONE (DELTASONE) 10 MG tablet Take 2 a day for 5 days then one a day for 5 days 08/12/20  Yes Raylene Everts, MD  acetaminophen (TYLENOL) 500 MG tablet Take 1,000 mg by mouth every 6 (six) hours as needed for moderate pain or headache.    [provider]  amitriptyline (ELAVIL) 10 MG tablet Take 20 mg by mouth at bedtime.     [provider]  amLODipine (NORVASC) 5 MG tablet Take 5 mg by mouth daily.     [provider]  B Complex-C (B-COMPLEX WITH VITAMIN C) tablet Take 1 tablet by mouth every evening.    [provider]  carvedilol (COREG) 6.25 MG tablet Take 6.25 mg by mouth 2 (two) times daily.     [provider]  CRANBERRY PO Take 1 tablet by mouth at bedtime.    [provider]  Cyanocobalamin (VITAMIN B-12 IJ) Inject 1,000 mg as directed every 28 (twenty-eight) days.     [provider]  DULoxetine (CYMBALTA) 60 MG capsule Take 60 mg by mouth daily.    [provider]  fluticasone (FLONASE) 50 MCG/ACT nasal spray Place 1-2 sprays into the nose daily as needed for allergies.     [provider]  gabapentin (NEURONTIN) 400 MG capsule Take 1 capsule (400 mg total) by mouth 3  (three) times daily. Patient taking differently: Take 400-800 mg by mouth See admin instructions. Take 400 mg in the morning, 400 mg at lunch, and 800 mg at supper 02/06/13   Domenic Polite, MD  HYDROcodone-acetaminophen (NORCO/VICODIN) 5-325 MG tablet Take 1-2 tablets by mouth every 4 (four) hours as needed for moderate pain. 02/27/20   Bobbye Charleston, MD  LEVEMIR FLEXTOUCH 100 UNIT/ML Pen Inject 53 Units into the skin every morning.  07/25/14   [provider]  levothyroxine (SYNTHROID) 175 MCG tablet Take 175 mcg by mouth daily before breakfast.    [provider]  losartan-hydrochlorothiazide (HYZAAR) 100-25 MG per tablet Take 1 tablet by mouth daily. For Hypertension  [provider]  magic mouthwash w/lidocaine SOLN Take 10 mLs by mouth 4 (four) times daily as needed for mouth pain. 02/27/20   Bobbye Charleston, MD  Melatonin 3 MG CAPS Take 3 mg by mouth at bedtime.     [provider]  mometasone (ELOCON) 0.1 % cream Apply 1 application topically daily as needed (rash).    [provider]  Multiple Vitamin (MULTIVITAMIN) tablet Take 1 tablet by mouth every evening.     [provider]  mupirocin ointment (BACTROBAN) 2 % Apply 1 application topically daily as needed (wound care).     [provider]  nitrofurantoin, macrocrystal-monohydrate, (MACROBID) 100 MG capsule nitrofurantoin monohydrate/macrocrystals 100 mg capsule  TAKE 1 CAPSULE BY MOUTH EVERY DAY    [provider]  polyethylene glycol (MIRALAX / GLYCOLAX) packet Take 17 g by mouth daily as needed for mild constipation.     [provider]  Polyvinyl Alcohol-Povidone (REFRESH OP) Place 1 drop into both eyes daily as needed (dry eyes).    [provider]  pravastatin (PRAVACHOL) 80 MG tablet Take 80 mg by mouth daily.    [provider]  Probiotic Product (PROBIOTIC DAILY PO) Take 1 capsule by mouth daily.    [provider]   tiZANidine (ZANAFLEX) 4 MG capsule Take 4 mg by mouth at bedtime as needed for muscle spasms.     [provider]    Family History Family History  Problem Relation Age of Onset  . Colon cancer Brother   . Diabetes Mother        and Sister   . Uterine cancer Sister     Social History Social History   Tobacco Use  . Smoking status: Never Smoker  . Smokeless tobacco: Never Used  Vaping Use  . Vaping Use: Never used  Substance Use Topics  . Alcohol use: No  . Drug use: No     Allergies   Nsaids, Onglyza [saxagliptin], Penicillins, Polysporin [bacitracin-polymyxin b], Cephalosporins, Cortisporin [bacitra-neomycin-polymyxin-hc], Ketoconazole, Neosporin plus max st, Other, Polymyxin b, Tioconazole, Corticotropin, Lisinopril, Mucinex [guaifenesin er], and Victoza [liraglutide]   Review of Systems Review of Systems See HPI  Physical Exam Triage Vital Signs ED Triage Vitals  Enc Vitals Group     BP 08/12/20 1300 126/72     Pulse Rate 08/12/20 1300 63     Resp 08/12/20 1300 18     Temp 08/12/20 1300 98.3 F (36.8 C)     Temp Source 08/12/20 1300 Oral     SpO2 08/12/20 1300 97 %     Weight 08/12/20 1256 208 lb (94.3 kg)     Height 08/12/20 1256 5\' 2"  (1.575 m)     Head Circumference --      Peak Flow --      Pain Score 08/12/20 1256 3     Pain Loc --      Pain Edu? --      Excl. in [place]? --    No data found.  Updated Vital Signs BP 126/72 (BP Location: Left Arm)   Pulse 63   Temp 98.3 F (36.8 C) (Oral)   Resp 18   Ht 5\' 2"  (1.575 m)   Wt 94.3 kg   SpO2 97%   BMI 38.04 kg/m      Physical Exam Constitutional:      General: She is not in acute distress.    Appearance: She is well-developed.     Comments: Overweight.  No acute distress  HENT:     Head: Normocephalic and atraumatic.     Mouth/Throat:     Comments: Mask is in place Eyes:     Conjunctiva/sclera: Conjunctivae normal.     Pupils: Pupils are equal, round, and reactive to light.   Cardiovascular:     Rate and Rhythm: Normal rate and regular rhythm.     Heart sounds: Normal heart sounds.  Pulmonary:     Effort: Pulmonary effort is normal. No respiratory distress.     Breath sounds: Normal breath sounds. No wheezing, rhonchi or rales.  Chest:     Chest wall: Tenderness present.    Abdominal:     General: There is no distension.     Palpations: Abdomen is soft.     Tenderness: There is no abdominal tenderness. There is no guarding or rebound.     Comments: Abdomen is soft.  Nontender.  No hepatosplenomegaly  Musculoskeletal:        General: Normal range of motion.     Cervical back: Normal range of motion and neck supple. No tenderness.  Skin:    General: Skin is warm and dry.  Neurological:     Mental Status: She is alert.     Gait: Gait abnormal.     Comments: Slow movements.  Slight antalgia  Psychiatric:        Behavior: Behavior normal.      UC Treatments / Results  Labs (all labs ordered are listed, but only abnormal results are displayed) Labs Reviewed  POCT URINALYSIS DIP (MANUAL ENTRY) - Abnormal; Notable for the following components:      Result Value   Blood, UA small (*)    Protein Ur, POC =100 (*)    Leukocytes, UA Small (1+) (*)    All other components within normal limits  URINE CULTURE    EKG   Radiology DG Abdomen Acute W/Chest  Result Date: 08/12/2020 CLINICAL DATA:  Acute left upper quadrant abdominal pain. EXAM: DG ABDOMEN ACUTE WITH 1 VIEW CHEST COMPARISON:  None. FINDINGS: There is no evidence of dilated bowel loops or free intraperitoneal air. Stool is noted throughout the colon. No radiopaque calculi or other significant radiographic abnormality is seen. Heart size and mediastinal contours are within normal limits. Both lungs are clear. IMPRESSION: No evidence of bowel obstruction or ileus. No acute cardiopulmonary disease. Electronically Signed   By: Marijo Conception M.D.   On: 08/12/2020 14:17     Procedures Procedures (including critical care time)  Medications Ordered in UC Medications  ketorolac (TORADOL) 30 MG/ML injection 30 mg (30 mg Intramuscular Given 08/12/20 1446)    Initial Impression / Assessment and Plan / UC Course  I have reviewed the triage vital signs and the nursing notes.  Pertinent labs & imaging results that were available during my care of the patient were reviewed by me and considered in my medical decision making (see chart for details).    I am doing a urine culture although she has no urinary symptoms or CVAT.  I am following up on abnormal urine results. I believe patient has costochondritis.  No evidence of any heart or lung disease.  We will treat with a few days of prednisone.  I am using a low-dose because she states the prednisone often causes her to feel jittery she has pain medicine muscle relaxers.  Follow-up with PCP Final Clinical Impressions(s) / UC Diagnoses   Final diagnoses:  Other microscopic hematuria  Costochondritis  Rib pain on left  side     Discharge Instructions     Home to rest Take the prednisone as prescribed Take one dose today The low doses should not cause jittery feelings or insomnia Take your pain medication and tizanidine as needed Try a heating pad to chest on low heat See your doctor if not better in a few days   ED Prescriptions    Medication Sig Dispense Auth. Provider   predniSONE (DELTASONE) 10 MG tablet Take 2 a day for 5 days then one a day for 5 days 10 tablet Raylene Everts, MD     PDMP not reviewed this encounter.   Raylene Everts, MD 08/13/20 [phone]

## 2020-08-14 LAB — URINE CULTURE
MICRO NUMBER:: 11657843
SPECIMEN QUALITY:: ADEQUATE

## 2020-08-24 ENCOUNTER — Emergency Department (INDEPENDENT_AMBULATORY_CARE_PROVIDER_SITE_OTHER)
Admission: RE | Admit: 2020-08-24 | Discharge: 2020-08-24 | Disposition: A | Discharge status: Home / Self Care | Payer: Medicare Other | Source: Ambulatory Visit

## 2020-08-24 ENCOUNTER — Other Ambulatory Visit: Payer: Self-pay

## 2020-08-24 VITALS — BP 147/81 | HR 58 | Temp 98.5°F | Resp 18 | Ht 63.0 in | Wt 208.0 lb

## 2020-08-24 DIAGNOSIS — M940 Chondrocostal junction syndrome [Tietze]: Secondary | ICD-10-CM

## 2020-08-24 DIAGNOSIS — J324 Chronic pansinusitis: Secondary | ICD-10-CM

## 2020-08-24 MED ORDER — LEVOFLOXACIN 500 MG PO TABS: 500.0000 mg | ORAL_TABLET | Freq: Every day | ORAL | 0 refills | Status: DC

## 2020-08-24 NOTE — ED Provider Notes (Signed)
[place]   [id number] 08/24/20 Arrival Time: 5956  LO:VFIE THROAT  SUBJECTIVE: History from: patient.  Abigail [name] is a 80 y.o. female who presents with gradual onset of nasal congestion, headache, sinus pain, nasal congestion, L rib pain, fatigue for the last few weeks. Was seen in this office and treated with steroids for costochondritis with negative Xray on 08/12/20. Denies sick exposure to Covid, strep, flu or mono, or precipitating event. Has negative history of Covid. Has had Covid vaccines. Has had Covid booster. Has had flu vaccine this season. Has tried steroids, round of doxycyline, flonase, netti pot without relief. There are no aggravating or alleviating factors. Reports previous symptoms in the past. Reports that she has appt with ENT on 09/15/20 but feels so badly that she cannot wait. Reports that she is the sole caregiver for her husband as well.  Denies fever, chills, ear pain, rhinorrhea, SOB, wheezing, chest pain, nausea, rash, changes in bowel or bladder habits.    ROS: As per HPI.  All other pertinent ROS negative.     Past Medical History:  Diagnosis Date  . Arthritis    shoulder, knee,   . Boils    near vaginal area  . Chronic kidney disease    stage 3; low kidney function; BUN was high acc to pt  . Constipation due to pain medication   . Depression   . Diabetes mellitus    Type 2  . Family hx of colon cancer   . Fibromyalgia   . Frequency of urination   . Frequent urination at night   . GERD (gastroesophageal reflux disease)    pt denies  . Heart murmur   . History of bladder infections   . Hyperlipidemia   . Hypertension   . Hypothyroidism   . Itchy eyes   . Obesity   . Obesity   . PONV (postoperative nausea and vomiting)    with stapedectomy and   . Torn rotator cuff left   current   Past Surgical History:  Procedure Laterality Date  . BACK SURGERY     lumbar fusion  . BLADDER SUSPENSION N/A 02/26/2020   Procedure:  TRANSVAGINAL TAPE (TVT) PROCEDURE;  Surgeon: Bobbye Charleston, MD;  Location: [facility];  Service: Gynecology;  Laterality: N/A;  . CARPAL TUNNEL RELEASE Right 06/29/2017   Procedure: RIGHT CARPAL TUNNEL RELEASE;  Surgeon: Daryll Brod, MD;  Location: [place];  Service: Orthopedics;  Laterality: Right;  . CHOLECYSTECTOMY  1971  . COLONOSCOPY    . CYSTOCELE REPAIR N/A 02/26/2020   Procedure: ANTERIOR REPAIR (CYSTOCELE);  Surgeon: Bobbye Charleston, MD;  Location: [facility];  Service: Gynecology;  Laterality: N/A;  . CYSTOSCOPY N/A 02/26/2020   Procedure: CYSTOSCOPY;  Surgeon: Bobbye Charleston, MD;  Location: [facility];  Service: Gynecology;  Laterality: N/A;  . DILATION AND CURETTAGE OF UTERUS    . EYE SURGERY Bilateral 2020  . MAXIMUM ACCESS (MAS)POSTERIOR LUMBAR INTERBODY FUSION (PLIF) 1 LEVEL N/A 11/07/2014   Procedure: Lumbar Four-Five Maximum access posterior lumbar interbody fusion;  Surgeon: Erline Levine, MD;  Location: [place] [facility];  Service: Neurosurgery;  Laterality: N/A;  L4-5 Maximum access posterior lumbar interbody fusion  . SHOULDER SURGERY     bilateral rotator cuff repairs  . STAPEDECTOMY Left    ear  . TUBAL LIGATION  1971  . VAGINAL HYSTERECTOMY N/A 02/26/2020   Procedure: HYSTERECTOMY VAGINAL;  Surgeon: Bobbye Charleston, MD;  Location: [facility];  Service: Gynecology;  Laterality: N/A;   Allergies  Allergen Reactions  . Nsaids Other (See Comments)    Stage 3 kidney disease Stage 3 kidney disease Stage 3 kidney disease  . Onglyza [Saxagliptin] Swelling    Made legs and ankles swell  . Penicillins Hives  . Polysporin [Bacitracin-Polymyxin B] Dermatitis and Other (See Comments)    Allergic to ointments such as polysporin, neosporin, cortisporin  . Cephalosporins Hives  . Cortisporin [Bacitra-Neomycin-Polymyxin-Hc]     Other reaction(s): rash  . Ketoconazole Hives  . Neosporin Plus  Max St     Other reaction(s): rash  . Other Other (See Comments)    Other reaction(s): hives  . Polymyxin B Other (See Comments)    Other reaction(s): rash  . Tioconazole Hives  . Corticotropin Rash  . Lisinopril Cough  . Mucinex [Guaifenesin Er] Rash  . Victoza [Liraglutide] Other (See Comments)    constipation   No current facility-administered medications on file prior to encounter.   Current Outpatient Medications on File Prior to Encounter  Medication Sig Dispense Refill  . acetaminophen (TYLENOL) 500 MG tablet Take 1,000 mg by mouth every 6 (six) hours as needed for moderate pain or headache.    Marland Kitchen amitriptyline (ELAVIL) 10 MG tablet Take 20 mg by mouth at bedtime.     Marland Kitchen amLODipine (NORVASC) 5 MG tablet Take 5 mg by mouth daily.     . B Complex-C (B-COMPLEX WITH VITAMIN C) tablet Take 1 tablet by mouth every evening.    . carvedilol (COREG) 6.25 MG tablet Take 6.25 mg by mouth 2 (two) times daily.     Marland Kitchen CRANBERRY PO Take 1 tablet by mouth at bedtime.    . Cyanocobalamin (VITAMIN B-12 IJ) Inject 1,000 mg as directed every 28 (twenty-eight) days.     . DULoxetine (CYMBALTA) 60 MG capsule Take 60 mg by mouth daily.    . fluticasone (FLONASE) 50 MCG/ACT nasal spray Place 1-2 sprays into the nose daily as needed for allergies.     Marland Kitchen gabapentin (NEURONTIN) 400 MG capsule Take 1 capsule (400 mg total) by mouth 3 (three) times daily. (Patient taking differently: Take 400-800 mg by mouth See admin instructions. Take 400 mg in the morning, 400 mg at lunch, and 800 mg at supper)    . HYDROcodone-acetaminophen (NORCO/VICODIN) 5-325 MG tablet Take 1-2 tablets by mouth every 4 (four) hours as needed for moderate pain. 30 tablet 0  . ipratropium-albuterol (DUONEB) 0.5-2.5 (3) MG/3ML SOLN Take 3 mLs by nebulization.    Marland Kitchen LEVEMIR FLEXTOUCH 100 UNIT/ML Pen Inject 53 Units into the skin every morning.   1  . levothyroxine (SYNTHROID) 175 MCG tablet Take 175 mcg by mouth daily before breakfast.    .  losartan-hydrochlorothiazide (HYZAAR) 100-25 MG per tablet Take 1 tablet by mouth daily. For Hypertension    . magic mouthwash w/lidocaine SOLN Take 10 mLs by mouth 4 (four) times daily as needed for mouth pain. 100 mL 2  . Melatonin 3 MG CAPS Take 3 mg by mouth at bedtime.     . mometasone (ELOCON) 0.1 % cream Apply 1 application topically daily as needed (rash).    . Multiple Vitamin (MULTIVITAMIN) tablet Take 1 tablet by mouth every evening.     . mupirocin ointment (BACTROBAN) 2 % Apply 1 application topically daily as needed (wound care).     . nitrofurantoin, macrocrystal-monohydrate, (MACROBID) 100 MG capsule nitrofurantoin monohydrate/macrocrystals 100 mg capsule  TAKE 1 CAPSULE BY MOUTH EVERY  DAY    . polyethylene glycol (MIRALAX / GLYCOLAX) packet Take 17 g by mouth daily as needed for mild constipation.     . Polyvinyl Alcohol-Povidone (REFRESH OP) Place 1 drop into both eyes daily as needed (dry eyes).    . pravastatin (PRAVACHOL) 80 MG tablet Take 80 mg by mouth daily.    . predniSONE (DELTASONE) 10 MG tablet Take 2 a day for 5 days then one a day for 5 days 10 tablet 0  . Probiotic Product (PROBIOTIC DAILY PO) Take 1 capsule by mouth daily.    Marland Kitchen tiZANidine (ZANAFLEX) 4 MG capsule Take 4 mg by mouth at bedtime as needed for muscle spasms.      Social History   Socioeconomic History  . Marital status: Married    Spouse name: Not on file  . Number of children: 3  . Years of education: Not on file  . Highest education level: Not on file  Occupational History  . Occupation: Retired  Tobacco Use  . Smoking status: Never Smoker  . Smokeless tobacco: Never Used  Vaping Use  . Vaping Use: Never used  Substance and Sexual Activity  . Alcohol use: No  . Drug use: No  . Sexual activity: Not on file  Other Topics Concern  . Not on file  Social History Narrative  . Not on file   Social Determinants of Health   Financial Resource Strain: Not on file  Food Insecurity: Not on  file  Transportation Needs: Not on file  Physical Activity: Not on file  Stress: Not on file  Social Connections: Not on file  Intimate Partner Violence: Not on file   Family History  Problem Relation Age of Onset  . Colon cancer Brother   . Diabetes Mother        and Sister   . Uterine cancer Sister     OBJECTIVE:  Vitals:   08/24/20 1306 08/24/20 1307  BP: (!) 147/81   Pulse: (!) 58   Resp: 18   Temp: 98.5 F (36.9 C)   TempSrc: Oral   SpO2: 97%   Weight:  208 lb (94.3 kg)  Height:  5\' 3"  (1.6 m)     General appearance: alert; appears fatigued, but nontoxic, speaking in full sentences and managing own secretions HEENT: NCAT; Ears: EACs clear, TMs pearly gray with visible cone of light, without erythema; Eyes: PERRL, EOMI grossly; Nose: no obvious rhinorrhea; Throat: oropharynx erythematous, tonsils 1+ and mildly erythematous without white tonsillar exudates, cobblestoning present, uvula midline; Sinuses: maxillary and frontal sinuses tender to palpation Neck: supple without LAD Lungs: CTA bilaterally without adventitious breath sounds; cough absent Heart: regular rate and rhythm.  Radial pulses 2+ symmetrical bilaterally Skin: warm and dry Psychological: alert and cooperative; normal mood and affect  LABS: No results found for this or any previous visit (from the past 24 hour(s)).   ASSESSMENT & PLAN:  1. Chronic pansinusitis   2. Costochondritis     Meds ordered this encounter  Medications  . levofloxacin (LEVAQUIN) 500 MG tablet    Sig: Take 1 tablet (500 mg total) by mouth daily.    Dispense:  5 tablet    Refill:  0    Order Specific Question:   Supervising Provider    Answer:   Chase Picket [id number]    Chronic Pansinusitis Push fluids and get rest Will treat Levaquin 500mg  once daily for 5 days at this time due to length of symptoms, medication allergies and  treatment failure previously with doxycycline Take as directed and to completion.   Drink warm or cool liquids, use throat lozenges, or popsicles to help alleviate symptoms Take OTC ibuprofen or tylenol as needed for pain May use Zyrtec D and flonase to help alleviate symptoms Follow up with ENT as scheduled Follow up with PCP if symptoms persist Return or go to ER if you have any new or worsening symptoms such as fever, chills, nausea, vomiting, worsening sore throat, cough, abdominal pain, chest pain, changes in bowel or bladder habits.   Reviewed expectations re: course of current medical issues. Questions answered. Outlined signs and symptoms indicating need for more acute intervention. Patient verbalized understanding. After Visit Summary given.          Faustino Congress, NP 08/24/20 1538

## 2020-08-24 NOTE — Discharge Instructions (Signed)
I have sent in Levaquin for you to take once a day for 5 days  Continue home medication regimen as prescribed  Follow up with ENT as scheduled  Follow up with PCP as needed if symptoms are persisting

## 2020-08-24 NOTE — ED Triage Notes (Signed)
Sinus pain, pressure, congestion, drainage. Has appt with ENT mid-April but needs help now. Using humidifier, Flonase, stopped netty pot.

## 2020-08-25 DIAGNOSIS — G4700 Insomnia, unspecified: Secondary | ICD-10-CM | POA: Diagnosis not present

## 2020-08-25 DIAGNOSIS — N183 Chronic kidney disease, stage 3 unspecified: Secondary | ICD-10-CM | POA: Diagnosis not present

## 2020-08-25 DIAGNOSIS — D500 Iron deficiency anemia secondary to blood loss (chronic): Secondary | ICD-10-CM | POA: Diagnosis not present

## 2020-08-25 DIAGNOSIS — E782 Mixed hyperlipidemia: Secondary | ICD-10-CM | POA: Diagnosis not present

## 2020-08-25 DIAGNOSIS — M1990 Unspecified osteoarthritis, unspecified site: Secondary | ICD-10-CM | POA: Diagnosis not present

## 2020-08-25 DIAGNOSIS — M179 Osteoarthritis of knee, unspecified: Secondary | ICD-10-CM | POA: Diagnosis not present

## 2020-08-25 DIAGNOSIS — E039 Hypothyroidism, unspecified: Secondary | ICD-10-CM | POA: Diagnosis not present

## 2020-08-25 DIAGNOSIS — D510 Vitamin B12 deficiency anemia due to intrinsic factor deficiency: Secondary | ICD-10-CM | POA: Diagnosis not present

## 2020-08-25 DIAGNOSIS — E1140 Type 2 diabetes mellitus with diabetic neuropathy, unspecified: Secondary | ICD-10-CM | POA: Diagnosis not present

## 2020-08-25 DIAGNOSIS — I10 Essential (primary) hypertension: Secondary | ICD-10-CM | POA: Diagnosis not present

## 2020-09-02 DIAGNOSIS — R350 Frequency of micturition: Secondary | ICD-10-CM | POA: Diagnosis not present

## 2020-09-02 DIAGNOSIS — M940 Chondrocostal junction syndrome [Tietze]: Secondary | ICD-10-CM | POA: Diagnosis not present

## 2020-09-02 DIAGNOSIS — E039 Hypothyroidism, unspecified: Secondary | ICD-10-CM | POA: Diagnosis not present

## 2020-09-02 DIAGNOSIS — J019 Acute sinusitis, unspecified: Secondary | ICD-10-CM | POA: Diagnosis not present

## 2020-09-03 DIAGNOSIS — Z961 Presence of intraocular lens: Secondary | ICD-10-CM | POA: Diagnosis not present

## 2020-09-03 DIAGNOSIS — H16102 Unspecified superficial keratitis, left eye: Secondary | ICD-10-CM | POA: Diagnosis not present

## 2020-09-03 DIAGNOSIS — H04123 Dry eye syndrome of bilateral lacrimal glands: Secondary | ICD-10-CM | POA: Diagnosis not present

## 2020-09-15 DIAGNOSIS — J321 Chronic frontal sinusitis: Secondary | ICD-10-CM | POA: Diagnosis not present

## 2020-09-15 DIAGNOSIS — J329 Chronic sinusitis, unspecified: Secondary | ICD-10-CM | POA: Diagnosis not present

## 2020-09-15 DIAGNOSIS — J0110 Acute frontal sinusitis, unspecified: Secondary | ICD-10-CM | POA: Diagnosis not present

## 2020-09-15 DIAGNOSIS — J320 Chronic maxillary sinusitis: Secondary | ICD-10-CM | POA: Diagnosis not present

## 2020-09-26 DIAGNOSIS — M1990 Unspecified osteoarthritis, unspecified site: Secondary | ICD-10-CM | POA: Diagnosis not present

## 2020-09-26 DIAGNOSIS — E1140 Type 2 diabetes mellitus with diabetic neuropathy, unspecified: Secondary | ICD-10-CM | POA: Diagnosis not present

## 2020-09-26 DIAGNOSIS — E782 Mixed hyperlipidemia: Secondary | ICD-10-CM | POA: Diagnosis not present

## 2020-09-26 DIAGNOSIS — N183 Chronic kidney disease, stage 3 unspecified: Secondary | ICD-10-CM | POA: Diagnosis not present

## 2020-09-26 DIAGNOSIS — E039 Hypothyroidism, unspecified: Secondary | ICD-10-CM | POA: Diagnosis not present

## 2020-09-26 DIAGNOSIS — D510 Vitamin B12 deficiency anemia due to intrinsic factor deficiency: Secondary | ICD-10-CM | POA: Diagnosis not present

## 2020-09-26 DIAGNOSIS — G4700 Insomnia, unspecified: Secondary | ICD-10-CM | POA: Diagnosis not present

## 2020-09-26 DIAGNOSIS — I10 Essential (primary) hypertension: Secondary | ICD-10-CM | POA: Diagnosis not present

## 2020-09-26 DIAGNOSIS — D500 Iron deficiency anemia secondary to blood loss (chronic): Secondary | ICD-10-CM | POA: Diagnosis not present

## 2020-10-07 DIAGNOSIS — H5211 Myopia, right eye: Secondary | ICD-10-CM | POA: Diagnosis not present

## 2020-10-07 DIAGNOSIS — Z961 Presence of intraocular lens: Secondary | ICD-10-CM | POA: Diagnosis not present

## 2020-10-07 DIAGNOSIS — H16102 Unspecified superficial keratitis, left eye: Secondary | ICD-10-CM | POA: Diagnosis not present

## 2020-10-07 DIAGNOSIS — H5202 Hypermetropia, left eye: Secondary | ICD-10-CM | POA: Diagnosis not present

## 2020-10-07 DIAGNOSIS — D3130 Benign neoplasm of unspecified choroid: Secondary | ICD-10-CM | POA: Diagnosis not present

## 2020-10-07 DIAGNOSIS — H524 Presbyopia: Secondary | ICD-10-CM | POA: Diagnosis not present

## 2020-10-07 DIAGNOSIS — H52223 Regular astigmatism, bilateral: Secondary | ICD-10-CM | POA: Diagnosis not present

## 2020-10-07 DIAGNOSIS — H353132 Nonexudative age-related macular degeneration, bilateral, intermediate dry stage: Secondary | ICD-10-CM | POA: Diagnosis not present

## 2020-10-07 DIAGNOSIS — E119 Type 2 diabetes mellitus without complications: Secondary | ICD-10-CM | POA: Diagnosis not present

## 2020-10-13 DIAGNOSIS — E1140 Type 2 diabetes mellitus with diabetic neuropathy, unspecified: Secondary | ICD-10-CM | POA: Diagnosis not present

## 2020-10-13 DIAGNOSIS — M179 Osteoarthritis of knee, unspecified: Secondary | ICD-10-CM | POA: Diagnosis not present

## 2020-10-13 DIAGNOSIS — E782 Mixed hyperlipidemia: Secondary | ICD-10-CM | POA: Diagnosis not present

## 2020-10-13 DIAGNOSIS — D510 Vitamin B12 deficiency anemia due to intrinsic factor deficiency: Secondary | ICD-10-CM | POA: Diagnosis not present

## 2020-10-13 DIAGNOSIS — D500 Iron deficiency anemia secondary to blood loss (chronic): Secondary | ICD-10-CM | POA: Diagnosis not present

## 2020-10-13 DIAGNOSIS — M1990 Unspecified osteoarthritis, unspecified site: Secondary | ICD-10-CM | POA: Diagnosis not present

## 2020-10-13 DIAGNOSIS — G4700 Insomnia, unspecified: Secondary | ICD-10-CM | POA: Diagnosis not present

## 2020-10-13 DIAGNOSIS — I10 Essential (primary) hypertension: Secondary | ICD-10-CM | POA: Diagnosis not present

## 2020-10-13 DIAGNOSIS — N183 Chronic kidney disease, stage 3 unspecified: Secondary | ICD-10-CM | POA: Diagnosis not present

## 2020-10-13 DIAGNOSIS — E039 Hypothyroidism, unspecified: Secondary | ICD-10-CM | POA: Diagnosis not present

## 2020-10-19 DIAGNOSIS — M25511 Pain in right shoulder: Secondary | ICD-10-CM | POA: Diagnosis not present

## 2020-10-19 DIAGNOSIS — M25561 Pain in right knee: Secondary | ICD-10-CM | POA: Diagnosis not present

## 2020-10-20 DIAGNOSIS — M5412 Radiculopathy, cervical region: Secondary | ICD-10-CM | POA: Diagnosis not present

## 2020-10-20 DIAGNOSIS — Z981 Arthrodesis status: Secondary | ICD-10-CM | POA: Diagnosis not present

## 2020-10-20 DIAGNOSIS — M4802 Spinal stenosis, cervical region: Secondary | ICD-10-CM | POA: Diagnosis not present

## 2020-10-20 DIAGNOSIS — M5417 Radiculopathy, lumbosacral region: Secondary | ICD-10-CM | POA: Diagnosis not present

## 2020-11-03 DIAGNOSIS — R61 Generalized hyperhidrosis: Secondary | ICD-10-CM | POA: Diagnosis not present

## 2020-11-03 DIAGNOSIS — Z794 Long term (current) use of insulin: Secondary | ICD-10-CM | POA: Diagnosis not present

## 2020-11-03 DIAGNOSIS — E039 Hypothyroidism, unspecified: Secondary | ICD-10-CM | POA: Diagnosis not present

## 2020-11-03 DIAGNOSIS — E162 Hypoglycemia, unspecified: Secondary | ICD-10-CM | POA: Diagnosis not present

## 2020-11-03 DIAGNOSIS — R8290 Unspecified abnormal findings in urine: Secondary | ICD-10-CM | POA: Diagnosis not present

## 2020-11-05 ENCOUNTER — Emergency Department (INDEPENDENT_AMBULATORY_CARE_PROVIDER_SITE_OTHER)
Admission: EM | Admit: 2020-11-05 | Discharge: 2020-11-05 | Disposition: A | Discharge status: Home / Self Care | Payer: Medicare Other | Source: Home / Self Care

## 2020-11-05 ENCOUNTER — Other Ambulatory Visit: Payer: Self-pay

## 2020-11-05 ENCOUNTER — Encounter: Payer: Self-pay | Admitting: Emergency Medicine

## 2020-11-05 DIAGNOSIS — J029 Acute pharyngitis, unspecified: Secondary | ICD-10-CM | POA: Diagnosis not present

## 2020-11-05 MED ORDER — AZITHROMYCIN 250 MG PO TABS: 250.0000 mg | ORAL_TABLET | Freq: Every day | ORAL | 0 refills | Status: DC

## 2020-11-05 NOTE — ED Triage Notes (Signed)
Patient c/o sore throat x 1 day, congestion from allergies.  Patient denies any OTC pain meds.  Patient is vaccinated for COVID.

## 2020-11-05 NOTE — ED Provider Notes (Signed)
[name]    CSN: [id number] Arrival date & time: 11/05/20  1031      History   Chief Complaint Chief Complaint  Patient presents with   Sore Throat    HPI Abigail [name] is a 80 y.o. female.   HPI 80 year old female presents with sore throat for 1 day.  Patient denies using OTC medications and is vaccinated for COVID-19.  Denies fever.  Patient reports that her husband has been hospitalized for pneumonia and rare respiratory virus.  Past Medical History:  Diagnosis Date   Arthritis    shoulder, knee,    Boils    near vaginal area   Chronic kidney disease    stage 3; low kidney function; BUN was high acc to pt   Constipation due to pain medication    Depression    Diabetes mellitus    Type 2   Family hx of colon cancer    Fibromyalgia    Frequency of urination    Frequent urination at night    GERD (gastroesophageal reflux disease)    pt denies   Heart murmur    History of bladder infections    Hyperlipidemia    Hypertension    Hypothyroidism    Itchy eyes    Obesity    Obesity    PONV (postoperative nausea and vomiting)    with stapedectomy and    Torn rotator cuff left   current    Patient Active Problem List   Diagnosis Date Noted   Postoperative state 02/26/2020   Furuncle of vulva 12/06/2016   Bilateral carpal tunnel syndrome 11/23/2016   Neck pain 11/23/2016   Primary osteoarthritis of both first carpometacarpal joints 11/23/2016   Diabetic nephropathy ([place]) 08/18/2016   Right knee pain 01/03/2016   Asymmetrical hearing loss of both ears 02/27/2015   Pressure ulcer 11/10/2014   Spondylolisthesis of lumbar region 11/07/2014   CKD (chronic kidney disease), stage III ([place]) 07/24/2013   Chest pain 07/23/2013   Chronic pain 07/23/2013   Fibromyalgia 07/23/2013   Leukocytosis 02/07/2013   Weakness generalized 02/07/2013   Acute encephalopathy 02/05/2013   Anemia 02/05/2013   Acute renal failure ([place]) 02/05/2013   Dehydration  09/12/2012   Diabetes mellitus ([place]) 09/12/2012   Hypertension 09/12/2012   Hypothyroidism 09/12/2012   Hyperlipidemia 09/12/2012    Past Surgical History:  Procedure Laterality Date   BACK SURGERY     lumbar fusion   BLADDER SUSPENSION N/A 02/26/2020   Procedure: TRANSVAGINAL TAPE (TVT) PROCEDURE;  Surgeon: Bobbye Charleston, MD;  Location: [place];  Service: Gynecology;  Laterality: N/A;   CARPAL TUNNEL RELEASE Right 06/29/2017   Procedure: RIGHT CARPAL TUNNEL RELEASE;  Surgeon: Daryll Brod, MD;  Location: [facility];  Service: Orthopedics;  Laterality: Right;   CHOLECYSTECTOMY  1971   COLONOSCOPY     CYSTOCELE REPAIR N/A 02/26/2020   Procedure: ANTERIOR REPAIR (CYSTOCELE);  Surgeon: Bobbye Charleston, MD;  [facility];  Service: Gynecology;  Laterality: N/A;   CYSTOSCOPY N/A 02/26/2020   Procedure: CYSTOSCOPY;  Surgeon: Bobbye Charleston, MD;  Location: [facility];  Service: Gynecology;  Laterality: N/A;   DILATION AND CURETTAGE OF UTERUS     EYE SURGERY Bilateral 2020   MAXIMUM ACCESS (MAS)POSTERIOR LUMBAR INTERBODY FUSION (PLIF) 1 LEVEL N/A 11/07/2014   Procedure: Lumbar Four-Five Maximum access posterior lumbar interbody fusion;  Surgeon: Erline Levine, MD;  Location: [facility];  Service: Neurosurgery;  Laterality: N/A;  L4-5 Maximum access posterior lumbar interbody fusion   SHOULDER SURGERY     bilateral rotator cuff repairs   STAPEDECTOMY Left    ear   TUBAL LIGATION  1971   VAGINAL HYSTERECTOMY N/A 02/26/2020   Procedure: HYSTERECTOMY VAGINAL;  Surgeon: Bobbye Charleston, MD;  Location[facility];  Service: Gynecology;  Laterality: N/A;    OB History   No obstetric history on file.      Home Medications    Prior to Admission medications   Medication Sig Start Date End Date Taking? Authorizing Provider  acetaminophen (TYLENOL) 500 MG tablet Take 1,000 mg by mouth every 6 (six)  hours as needed for moderate pain or headache.   Yes [provider]  amitriptyline (ELAVIL) 10 MG tablet Take 20 mg by mouth at bedtime.    Yes [provider]  amLODipine (NORVASC) 5 MG tablet Take 5 mg by mouth daily.    Yes [provider]  azithromycin (ZITHROMAX) 250 MG tablet Take 1 tablet (250 mg total) by mouth daily. Take first 2 tablets together, then 1 every day until finished. 11/05/20  Yes Eliezer Lofts, FNP  carvedilol (COREG) 6.25 MG tablet Take 6.25 mg by mouth 2 (two) times daily.    Yes [provider]  Cyanocobalamin (VITAMIN B-12 IJ) Inject 1,000 mg as directed every 28 (twenty-eight) days.    Yes [provider]  DULoxetine (CYMBALTA) 60 MG capsule Take 60 mg by mouth daily.   Yes [provider]  fluticasone (FLONASE) 50 MCG/ACT nasal spray Place 1-2 sprays into the nose daily as needed for allergies.    Yes [provider]  gabapentin (NEURONTIN) 400 MG capsule Take 1 capsule (400 mg total) by mouth 3 (three) times daily. Patient taking differently: Take 400-800 mg by mouth See admin instructions. Take 400 mg in the morning, 400 mg at lunch, and 800 mg at supper 02/06/13  Yes Domenic Polite, MD  HYDROcodone-acetaminophen (NORCO/VICODIN) 5-325 MG tablet Take 1-2 tablets by mouth every 4 (four) hours as needed for moderate pain. 02/27/20  Yes Bobbye Charleston, MD  LEVEMIR FLEXTOUCH 100 UNIT/ML Pen Inject 53 Units into the skin every morning.  07/25/14  Yes [provider]  levothyroxine (SYNTHROID) 175 MCG tablet Take 175 mcg by mouth daily before breakfast.   Yes [provider]  liothyronine (CYTOMEL) 5 MCG tablet 2 TABLETS ON AN EMPTY STOMACH ORALLY TWICE A DAY 30 DAY(S)   Yes [provider]  losartan-hydrochlorothiazide (HYZAAR) 100-25 MG per tablet Take 1 tablet by mouth daily. For Hypertension   Yes [provider]  Melatonin 3 MG CAPS Take 3 mg by mouth at bedtime.    Yes  [provider]  mometasone (ELOCON) 0.1 % cream Apply 1 application topically daily as needed (rash).   Yes [provider]  Multiple Vitamin (MULTIVITAMIN) tablet Take 1 tablet by mouth every evening.    Yes [provider]  mupirocin ointment (BACTROBAN) 2 % Apply 1 application topically daily as needed (wound care).    Yes [provider]  nitrofurantoin, macrocrystal-monohydrate, (MACROBID) 100 MG capsule nitrofurantoin monohydrate/macrocrystals 100 mg capsule  TAKE 1 CAPSULE BY MOUTH EVERY DAY   Yes [provider]  polyethylene glycol (MIRALAX / GLYCOLAX) packet Take 17 g by mouth daily as needed for mild constipation.    Yes [provider]  Polyvinyl Alcohol-Povidone (REFRESH OP) Place 1 drop into both eyes daily as needed (dry eyes).   Yes [provider]  pravastatin (PRAVACHOL) 80 MG tablet Take 80 mg by mouth daily.   Yes [provider]  Probiotic Product (PROBIOTIC DAILY PO) Take 1 capsule by mouth daily.   Yes [provider]  tiZANidine (ZANAFLEX) 4 MG capsule Take 4 mg by mouth at bedtime as needed for muscle spasms.    Yes [provider]  B Complex-C (B-COMPLEX WITH VITAMIN C) tablet Take 1 tablet by mouth every evening.    [provider]  CRANBERRY PO Take 1 tablet by mouth at bedtime.    [provider]  ipratropium-albuterol (DUONEB) 0.5-2.5 (3) MG/3ML SOLN Take 3 mLs by nebulization.    [provider]  levofloxacin (LEVAQUIN) 500 MG tablet Take 1 tablet (500 mg total) by mouth daily. 08/24/20   Faustino Congress, NP  magic mouthwash w/lidocaine SOLN Take 10 mLs by mouth 4 (four) times daily as needed for mouth pain. 02/27/20   Bobbye Charleston, MD  predniSONE (DELTASONE) 10 MG tablet Take 2 a day for 5 days then one a day for 5 days 08/12/20   Raylene Everts, MD    Family History Family History  Problem Relation Age of Onset   Colon cancer Brother     Diabetes Mother        and Sister    Uterine cancer Sister     Social History Social History   Tobacco Use   Smoking status: Never   Smokeless tobacco: Never  Vaping Use   Vaping Use: Never used  Substance Use Topics   Alcohol use: No   Drug use: No     Allergies   Nsaids, Onglyza [saxagliptin], Penicillins, Polysporin [bacitracin-polymyxin b], Cephalosporins, Cortisporin [bacitra-neomycin-polymyxin-hc], Ketoconazole, Neosporin plus max st, Other, Polymyxin b, Tioconazole, Corticotropin, Lisinopril, Mucinex [guaifenesin er], and Victoza [liraglutide]   Review of Systems Review of Systems  Constitutional: Negative.   HENT:  Positive for sore throat.   Eyes: Negative.   Respiratory: Negative.    Cardiovascular: Negative.   Gastrointestinal: Negative.   Genitourinary: Negative.   Musculoskeletal: Negative.   Skin: Negative.   Neurological: Negative.     Physical Exam Triage Vital Signs ED Triage Vitals  Enc Vitals Group     BP 11/05/20 1104 134/70     Pulse Rate 11/05/20 1104 (!) 52     Resp --      Temp 11/05/20 1104 98.6 F (37 C)     Temp Source 11/05/20 1104 Oral     SpO2 11/05/20 1104 96 %     Weight --      Height --      Head Circumference --      Peak Flow --      Pain Score 11/05/20 1105 0     Pain Loc --      Pain Edu? --      Excl. in [place]? --    No data found.  Updated Vital Signs BP 134/70 (BP Location: Right Arm)   Pulse (!) 52   Temp 98.6 F (37 C) (Oral)   SpO2 96%      Physical Exam Vitals and nursing note reviewed.  Constitutional:      General: She is not in acute distress.    Appearance: She is well-developed. She is obese. She is ill-appearing.  HENT:     Head: Normocephalic and atraumatic.     Right Ear: Tympanic membrane and ear canal normal.     Left Ear: Tympanic membrane and ear canal normal.  Mouth/Throat:     Mouth: Mucous membranes are moist.     Pharynx: Oropharynx is clear. Uvula midline. Posterior  oropharyngeal erythema present. No uvula swelling.     Tonsils: No tonsillar exudate or tonsillar abscesses. 0 on the right. 0 on the left.  Eyes:     Conjunctiva/sclera: Conjunctivae normal.     Pupils: Pupils are equal, round, and reactive to light.  Cardiovascular:     Rate and Rhythm: Normal rate and regular rhythm.     Heart sounds: Normal heart sounds.  Pulmonary:     Effort: Pulmonary effort is normal.     Breath sounds: Normal breath sounds. No wheezing, rhonchi or rales.  Musculoskeletal:     Cervical back: Normal range of motion and neck supple.  Lymphadenopathy:     Cervical: Cervical adenopathy present.  Skin:    General: Skin is warm and dry.  Neurological:     General: No focal deficit present.     Mental Status: She is alert and oriented to person, place, and time.  Psychiatric:        Mood and Affect: Mood normal.        Behavior: Behavior normal.     UC Treatments / Results  Labs (all labs ordered are listed, but only abnormal results are displayed) Labs Reviewed - No data to display  EKG   Radiology No results found.  Procedures Procedures (including critical care time)  Medications Ordered in UC Medications - No data to display  Initial Impression / Assessment and Plan / UC Course  I have reviewed the triage vital signs and the nursing notes.  Pertinent labs & imaging results that were available during my care of the patient were reviewed by me and considered in my medical decision making (see chart for details).    MDM: 1.  Acute pharyngitis.  Patient discharged home, hemodynamically stable. Final Clinical Impressions(s) / UC Diagnoses   Final diagnoses:  Acute pharyngitis, unspecified etiology     Discharge Instructions      Advised patient to take medication as directed with food to completion.  Advised patient may take Tylenol 1000 mg 1-2 times daily, as needed for sore throat pain.  Encourage patient increase daily water intake while  taking this medication.     ED Prescriptions     Medication Sig Dispense Auth. Provider   azithromycin (ZITHROMAX) 250 MG tablet Take 1 tablet (250 mg total) by mouth daily. Take first 2 tablets together, then 1 every day until finished. 6 tablet Eliezer Lofts, FNP      PDMP not reviewed this encounter.   Eliezer Lofts, [place] 11/05/20 1137

## 2020-11-05 NOTE — Discharge Instructions (Addendum)
Advised patient to take medication as directed with food to completion.  Advised patient may take Tylenol 1000 mg 1-2 times daily, as needed for sore throat pain.  Encourage patient increase daily water intake while taking this medication.

## 2020-11-20 DIAGNOSIS — E039 Hypothyroidism, unspecified: Secondary | ICD-10-CM | POA: Diagnosis not present

## 2020-11-20 DIAGNOSIS — D510 Vitamin B12 deficiency anemia due to intrinsic factor deficiency: Secondary | ICD-10-CM | POA: Diagnosis not present

## 2020-11-20 DIAGNOSIS — G4700 Insomnia, unspecified: Secondary | ICD-10-CM | POA: Diagnosis not present

## 2020-11-20 DIAGNOSIS — E782 Mixed hyperlipidemia: Secondary | ICD-10-CM | POA: Diagnosis not present

## 2020-11-20 DIAGNOSIS — N183 Chronic kidney disease, stage 3 unspecified: Secondary | ICD-10-CM | POA: Diagnosis not present

## 2020-11-20 DIAGNOSIS — D500 Iron deficiency anemia secondary to blood loss (chronic): Secondary | ICD-10-CM | POA: Diagnosis not present

## 2020-11-20 DIAGNOSIS — E1140 Type 2 diabetes mellitus with diabetic neuropathy, unspecified: Secondary | ICD-10-CM | POA: Diagnosis not present

## 2020-11-20 DIAGNOSIS — M1990 Unspecified osteoarthritis, unspecified site: Secondary | ICD-10-CM | POA: Diagnosis not present

## 2020-11-20 DIAGNOSIS — I10 Essential (primary) hypertension: Secondary | ICD-10-CM | POA: Diagnosis not present

## 2020-11-24 DIAGNOSIS — M5417 Radiculopathy, lumbosacral region: Secondary | ICD-10-CM | POA: Diagnosis not present

## 2020-11-26 DIAGNOSIS — G629 Polyneuropathy, unspecified: Secondary | ICD-10-CM | POA: Diagnosis not present

## 2020-11-26 DIAGNOSIS — R232 Flushing: Secondary | ICD-10-CM | POA: Diagnosis not present

## 2020-11-26 DIAGNOSIS — E039 Hypothyroidism, unspecified: Secondary | ICD-10-CM | POA: Diagnosis not present

## 2020-11-26 DIAGNOSIS — E1169 Type 2 diabetes mellitus with other specified complication: Secondary | ICD-10-CM | POA: Diagnosis not present

## 2020-11-26 DIAGNOSIS — R946 Abnormal results of thyroid function studies: Secondary | ICD-10-CM | POA: Diagnosis not present

## 2020-11-26 DIAGNOSIS — Z794 Long term (current) use of insulin: Secondary | ICD-10-CM | POA: Diagnosis not present

## 2020-11-26 DIAGNOSIS — Z6836 Body mass index (BMI) 36.0-36.9, adult: Secondary | ICD-10-CM | POA: Diagnosis not present

## 2020-12-02 DIAGNOSIS — E782 Mixed hyperlipidemia: Secondary | ICD-10-CM | POA: Diagnosis not present

## 2020-12-02 DIAGNOSIS — F411 Generalized anxiety disorder: Secondary | ICD-10-CM | POA: Diagnosis not present

## 2020-12-02 DIAGNOSIS — D510 Vitamin B12 deficiency anemia due to intrinsic factor deficiency: Secondary | ICD-10-CM | POA: Diagnosis not present

## 2020-12-02 DIAGNOSIS — Z0000 Encounter for general adult medical examination without abnormal findings: Secondary | ICD-10-CM | POA: Diagnosis not present

## 2020-12-02 DIAGNOSIS — E538 Deficiency of other specified B group vitamins: Secondary | ICD-10-CM | POA: Diagnosis not present

## 2020-12-02 DIAGNOSIS — R944 Abnormal results of kidney function studies: Secondary | ICD-10-CM | POA: Diagnosis not present

## 2020-12-02 DIAGNOSIS — E611 Iron deficiency: Secondary | ICD-10-CM | POA: Diagnosis not present

## 2020-12-02 DIAGNOSIS — E1140 Type 2 diabetes mellitus with diabetic neuropathy, unspecified: Secondary | ICD-10-CM | POA: Diagnosis not present

## 2020-12-02 DIAGNOSIS — R350 Frequency of micturition: Secondary | ICD-10-CM | POA: Diagnosis not present

## 2020-12-02 DIAGNOSIS — E559 Vitamin D deficiency, unspecified: Secondary | ICD-10-CM | POA: Diagnosis not present

## 2020-12-02 DIAGNOSIS — I10 Essential (primary) hypertension: Secondary | ICD-10-CM | POA: Diagnosis not present

## 2020-12-02 DIAGNOSIS — Z1389 Encounter for screening for other disorder: Secondary | ICD-10-CM | POA: Diagnosis not present

## 2020-12-18 DIAGNOSIS — Z23 Encounter for immunization: Secondary | ICD-10-CM | POA: Diagnosis not present

## 2020-12-28 DIAGNOSIS — R944 Abnormal results of kidney function studies: Secondary | ICD-10-CM | POA: Diagnosis not present

## 2020-12-28 DIAGNOSIS — E611 Iron deficiency: Secondary | ICD-10-CM | POA: Diagnosis not present

## 2020-12-28 DIAGNOSIS — E1140 Type 2 diabetes mellitus with diabetic neuropathy, unspecified: Secondary | ICD-10-CM | POA: Diagnosis not present

## 2020-12-28 DIAGNOSIS — E782 Mixed hyperlipidemia: Secondary | ICD-10-CM | POA: Diagnosis not present

## 2020-12-28 DIAGNOSIS — E559 Vitamin D deficiency, unspecified: Secondary | ICD-10-CM | POA: Diagnosis not present

## 2020-12-28 DIAGNOSIS — E538 Deficiency of other specified B group vitamins: Secondary | ICD-10-CM | POA: Diagnosis not present

## 2020-12-28 DIAGNOSIS — N390 Urinary tract infection, site not specified: Secondary | ICD-10-CM | POA: Diagnosis not present

## 2020-12-28 DIAGNOSIS — I10 Essential (primary) hypertension: Secondary | ICD-10-CM | POA: Diagnosis not present

## 2020-12-28 DIAGNOSIS — E876 Hypokalemia: Secondary | ICD-10-CM | POA: Diagnosis not present

## 2020-12-28 DIAGNOSIS — D510 Vitamin B12 deficiency anemia due to intrinsic factor deficiency: Secondary | ICD-10-CM | POA: Diagnosis not present

## 2020-12-28 DIAGNOSIS — R350 Frequency of micturition: Secondary | ICD-10-CM | POA: Diagnosis not present

## 2020-12-28 DIAGNOSIS — Z0000 Encounter for general adult medical examination without abnormal findings: Secondary | ICD-10-CM | POA: Diagnosis not present

## 2021-01-12 DIAGNOSIS — E039 Hypothyroidism, unspecified: Secondary | ICD-10-CM | POA: Diagnosis not present

## 2021-01-12 DIAGNOSIS — E782 Mixed hyperlipidemia: Secondary | ICD-10-CM | POA: Diagnosis not present

## 2021-01-12 DIAGNOSIS — M1990 Unspecified osteoarthritis, unspecified site: Secondary | ICD-10-CM | POA: Diagnosis not present

## 2021-01-12 DIAGNOSIS — E1140 Type 2 diabetes mellitus with diabetic neuropathy, unspecified: Secondary | ICD-10-CM | POA: Diagnosis not present

## 2021-01-12 DIAGNOSIS — I10 Essential (primary) hypertension: Secondary | ICD-10-CM | POA: Diagnosis not present

## 2021-01-12 DIAGNOSIS — N183 Chronic kidney disease, stage 3 unspecified: Secondary | ICD-10-CM | POA: Diagnosis not present

## 2021-01-12 DIAGNOSIS — D510 Vitamin B12 deficiency anemia due to intrinsic factor deficiency: Secondary | ICD-10-CM | POA: Diagnosis not present

## 2021-01-12 DIAGNOSIS — D500 Iron deficiency anemia secondary to blood loss (chronic): Secondary | ICD-10-CM | POA: Diagnosis not present

## 2021-01-12 DIAGNOSIS — G4700 Insomnia, unspecified: Secondary | ICD-10-CM | POA: Diagnosis not present

## 2021-01-18 DIAGNOSIS — C4441 Basal cell carcinoma of skin of scalp and neck: Secondary | ICD-10-CM | POA: Diagnosis not present

## 2021-01-18 DIAGNOSIS — L821 Other seborrheic keratosis: Secondary | ICD-10-CM | POA: Diagnosis not present

## 2021-01-18 DIAGNOSIS — D485 Neoplasm of uncertain behavior of skin: Secondary | ICD-10-CM | POA: Diagnosis not present

## 2021-01-20 DIAGNOSIS — M25511 Pain in right shoulder: Secondary | ICD-10-CM | POA: Diagnosis not present

## 2021-01-21 DIAGNOSIS — M5417 Radiculopathy, lumbosacral region: Secondary | ICD-10-CM | POA: Diagnosis not present

## 2021-01-21 DIAGNOSIS — M4802 Spinal stenosis, cervical region: Secondary | ICD-10-CM | POA: Diagnosis not present

## 2021-01-21 DIAGNOSIS — Z981 Arthrodesis status: Secondary | ICD-10-CM | POA: Diagnosis not present

## 2021-01-26 DIAGNOSIS — E099 Drug or chemical induced diabetes mellitus without complications: Secondary | ICD-10-CM | POA: Diagnosis not present

## 2021-02-03 DIAGNOSIS — E1169 Type 2 diabetes mellitus with other specified complication: Secondary | ICD-10-CM | POA: Diagnosis not present

## 2021-02-03 DIAGNOSIS — R946 Abnormal results of thyroid function studies: Secondary | ICD-10-CM | POA: Diagnosis not present

## 2021-02-03 DIAGNOSIS — Z6836 Body mass index (BMI) 36.0-36.9, adult: Secondary | ICD-10-CM | POA: Diagnosis not present

## 2021-02-03 DIAGNOSIS — G629 Polyneuropathy, unspecified: Secondary | ICD-10-CM | POA: Diagnosis not present

## 2021-02-03 DIAGNOSIS — R232 Flushing: Secondary | ICD-10-CM | POA: Diagnosis not present

## 2021-02-03 DIAGNOSIS — Z794 Long term (current) use of insulin: Secondary | ICD-10-CM | POA: Diagnosis not present

## 2021-02-03 DIAGNOSIS — E039 Hypothyroidism, unspecified: Secondary | ICD-10-CM | POA: Diagnosis not present

## 2021-02-04 DIAGNOSIS — Z23 Encounter for immunization: Secondary | ICD-10-CM | POA: Diagnosis not present

## 2021-02-11 DIAGNOSIS — C4441 Basal cell carcinoma of skin of scalp and neck: Secondary | ICD-10-CM | POA: Diagnosis not present

## 2021-02-11 DIAGNOSIS — Z85828 Personal history of other malignant neoplasm of skin: Secondary | ICD-10-CM | POA: Diagnosis not present

## 2021-02-12 DIAGNOSIS — R8271 Bacteriuria: Secondary | ICD-10-CM | POA: Diagnosis not present

## 2021-02-12 DIAGNOSIS — R350 Frequency of micturition: Secondary | ICD-10-CM | POA: Diagnosis not present

## 2021-02-25 DIAGNOSIS — M179 Osteoarthritis of knee, unspecified: Secondary | ICD-10-CM | POA: Diagnosis not present

## 2021-02-25 DIAGNOSIS — E039 Hypothyroidism, unspecified: Secondary | ICD-10-CM | POA: Diagnosis not present

## 2021-02-25 DIAGNOSIS — E782 Mixed hyperlipidemia: Secondary | ICD-10-CM | POA: Diagnosis not present

## 2021-02-25 DIAGNOSIS — D500 Iron deficiency anemia secondary to blood loss (chronic): Secondary | ICD-10-CM | POA: Diagnosis not present

## 2021-02-25 DIAGNOSIS — I10 Essential (primary) hypertension: Secondary | ICD-10-CM | POA: Diagnosis not present

## 2021-02-25 DIAGNOSIS — M1990 Unspecified osteoarthritis, unspecified site: Secondary | ICD-10-CM | POA: Diagnosis not present

## 2021-02-25 DIAGNOSIS — E1140 Type 2 diabetes mellitus with diabetic neuropathy, unspecified: Secondary | ICD-10-CM | POA: Diagnosis not present

## 2021-02-25 DIAGNOSIS — G4700 Insomnia, unspecified: Secondary | ICD-10-CM | POA: Diagnosis not present

## 2021-02-25 DIAGNOSIS — N183 Chronic kidney disease, stage 3 unspecified: Secondary | ICD-10-CM | POA: Diagnosis not present

## 2021-02-25 DIAGNOSIS — D510 Vitamin B12 deficiency anemia due to intrinsic factor deficiency: Secondary | ICD-10-CM | POA: Diagnosis not present

## 2021-03-03 DIAGNOSIS — L308 Other specified dermatitis: Secondary | ICD-10-CM | POA: Diagnosis not present

## 2021-03-04 DIAGNOSIS — N1831 Chronic kidney disease, stage 3a: Secondary | ICD-10-CM | POA: Diagnosis not present

## 2021-03-29 DIAGNOSIS — E1140 Type 2 diabetes mellitus with diabetic neuropathy, unspecified: Secondary | ICD-10-CM | POA: Diagnosis not present

## 2021-03-29 DIAGNOSIS — E782 Mixed hyperlipidemia: Secondary | ICD-10-CM | POA: Diagnosis not present

## 2021-03-29 DIAGNOSIS — N183 Chronic kidney disease, stage 3 unspecified: Secondary | ICD-10-CM | POA: Diagnosis not present

## 2021-03-29 DIAGNOSIS — I10 Essential (primary) hypertension: Secondary | ICD-10-CM | POA: Diagnosis not present

## 2021-03-29 DIAGNOSIS — D510 Vitamin B12 deficiency anemia due to intrinsic factor deficiency: Secondary | ICD-10-CM | POA: Diagnosis not present

## 2021-03-29 DIAGNOSIS — E039 Hypothyroidism, unspecified: Secondary | ICD-10-CM | POA: Diagnosis not present

## 2021-03-29 DIAGNOSIS — M1990 Unspecified osteoarthritis, unspecified site: Secondary | ICD-10-CM | POA: Diagnosis not present

## 2021-03-29 DIAGNOSIS — G4700 Insomnia, unspecified: Secondary | ICD-10-CM | POA: Diagnosis not present

## 2021-04-19 DIAGNOSIS — I10 Essential (primary) hypertension: Secondary | ICD-10-CM | POA: Diagnosis not present

## 2021-04-19 DIAGNOSIS — M1990 Unspecified osteoarthritis, unspecified site: Secondary | ICD-10-CM | POA: Diagnosis not present

## 2021-04-19 DIAGNOSIS — M179 Osteoarthritis of knee, unspecified: Secondary | ICD-10-CM | POA: Diagnosis not present

## 2021-04-19 DIAGNOSIS — E1140 Type 2 diabetes mellitus with diabetic neuropathy, unspecified: Secondary | ICD-10-CM | POA: Diagnosis not present

## 2021-04-19 DIAGNOSIS — D510 Vitamin B12 deficiency anemia due to intrinsic factor deficiency: Secondary | ICD-10-CM | POA: Diagnosis not present

## 2021-04-19 DIAGNOSIS — E039 Hypothyroidism, unspecified: Secondary | ICD-10-CM | POA: Diagnosis not present

## 2021-04-19 DIAGNOSIS — D500 Iron deficiency anemia secondary to blood loss (chronic): Secondary | ICD-10-CM | POA: Diagnosis not present

## 2021-04-19 DIAGNOSIS — N183 Chronic kidney disease, stage 3 unspecified: Secondary | ICD-10-CM | POA: Diagnosis not present

## 2021-04-19 DIAGNOSIS — E782 Mixed hyperlipidemia: Secondary | ICD-10-CM | POA: Diagnosis not present

## 2021-04-19 DIAGNOSIS — G4700 Insomnia, unspecified: Secondary | ICD-10-CM | POA: Diagnosis not present

## 2021-04-26 ENCOUNTER — Other Ambulatory Visit: Payer: Self-pay | Admitting: Obstetrics and Gynecology

## 2021-04-26 DIAGNOSIS — Z1231 Encounter for screening mammogram for malignant neoplasm of breast: Secondary | ICD-10-CM

## 2021-05-04 DIAGNOSIS — M542 Cervicalgia: Secondary | ICD-10-CM | POA: Diagnosis not present

## 2021-05-04 DIAGNOSIS — M4722 Other spondylosis with radiculopathy, cervical region: Secondary | ICD-10-CM | POA: Diagnosis not present

## 2021-05-04 DIAGNOSIS — R6889 Other general symptoms and signs: Secondary | ICD-10-CM | POA: Diagnosis not present

## 2021-05-05 DIAGNOSIS — E039 Hypothyroidism, unspecified: Secondary | ICD-10-CM | POA: Diagnosis not present

## 2021-05-14 DIAGNOSIS — M25511 Pain in right shoulder: Secondary | ICD-10-CM | POA: Diagnosis not present

## 2021-05-26 DIAGNOSIS — Z794 Long term (current) use of insulin: Secondary | ICD-10-CM | POA: Diagnosis not present

## 2021-05-26 DIAGNOSIS — R5382 Chronic fatigue, unspecified: Secondary | ICD-10-CM | POA: Diagnosis not present

## 2021-05-26 DIAGNOSIS — Z6836 Body mass index (BMI) 36.0-36.9, adult: Secondary | ICD-10-CM | POA: Diagnosis not present

## 2021-05-26 DIAGNOSIS — G629 Polyneuropathy, unspecified: Secondary | ICD-10-CM | POA: Diagnosis not present

## 2021-05-26 DIAGNOSIS — E039 Hypothyroidism, unspecified: Secondary | ICD-10-CM | POA: Diagnosis not present

## 2021-05-26 DIAGNOSIS — R232 Flushing: Secondary | ICD-10-CM | POA: Diagnosis not present

## 2021-05-26 DIAGNOSIS — E1169 Type 2 diabetes mellitus with other specified complication: Secondary | ICD-10-CM | POA: Diagnosis not present

## 2021-06-03 ENCOUNTER — Ambulatory Visit: Payer: Medicare Other

## 2021-06-08 DIAGNOSIS — U071 COVID-19: Secondary | ICD-10-CM | POA: Diagnosis not present

## 2021-06-24 ENCOUNTER — Ambulatory Visit
Admission: RE | Admit: 2021-06-24 | Discharge: 2021-06-24 | Disposition: A | Discharge status: Home / Self Care | Payer: Medicare Other | Source: Ambulatory Visit | Attending: Obstetrics and Gynecology | Admitting: Obstetrics and Gynecology

## 2021-06-24 DIAGNOSIS — M4802 Spinal stenosis, cervical region: Secondary | ICD-10-CM | POA: Diagnosis not present

## 2021-06-24 DIAGNOSIS — Z1231 Encounter for screening mammogram for malignant neoplasm of breast: Secondary | ICD-10-CM | POA: Diagnosis not present

## 2021-06-24 DIAGNOSIS — Z981 Arthrodesis status: Secondary | ICD-10-CM | POA: Diagnosis not present

## 2021-06-24 DIAGNOSIS — M5417 Radiculopathy, lumbosacral region: Secondary | ICD-10-CM | POA: Diagnosis not present

## 2021-07-01 DIAGNOSIS — H6691 Otitis media, unspecified, right ear: Secondary | ICD-10-CM | POA: Diagnosis not present

## 2021-07-01 DIAGNOSIS — R0981 Nasal congestion: Secondary | ICD-10-CM | POA: Diagnosis not present

## 2021-07-08 DIAGNOSIS — M4722 Other spondylosis with radiculopathy, cervical region: Secondary | ICD-10-CM | POA: Diagnosis not present

## 2021-07-11 ENCOUNTER — Ambulatory Visit: Payer: Self-pay

## 2021-07-12 ENCOUNTER — Emergency Department (INDEPENDENT_AMBULATORY_CARE_PROVIDER_SITE_OTHER)
Admission: RE | Admit: 2021-07-12 | Discharge: 2021-07-12 | Disposition: A | Discharge status: Home / Self Care | Payer: Medicare Other | Source: Ambulatory Visit | Attending: Family Medicine | Admitting: Family Medicine

## 2021-07-12 ENCOUNTER — Other Ambulatory Visit: Payer: Self-pay

## 2021-07-12 ENCOUNTER — Emergency Department (INDEPENDENT_AMBULATORY_CARE_PROVIDER_SITE_OTHER): Payer: Medicare Other

## 2021-07-12 VITALS — BP 131/73 | HR 60 | Temp 98.1°F | Resp 16 | Wt 202.0 lb

## 2021-07-12 DIAGNOSIS — M546 Pain in thoracic spine: Secondary | ICD-10-CM | POA: Diagnosis not present

## 2021-07-12 DIAGNOSIS — M545 Low back pain, unspecified: Secondary | ICD-10-CM | POA: Diagnosis not present

## 2021-07-12 DIAGNOSIS — S8000XA Contusion of unspecified knee, initial encounter: Secondary | ICD-10-CM

## 2021-07-12 DIAGNOSIS — W19XXXA Unspecified fall, initial encounter: Secondary | ICD-10-CM | POA: Diagnosis not present

## 2021-07-12 DIAGNOSIS — M40204 Unspecified kyphosis, thoracic region: Secondary | ICD-10-CM | POA: Diagnosis not present

## 2021-07-12 DIAGNOSIS — M549 Dorsalgia, unspecified: Secondary | ICD-10-CM

## 2021-07-12 NOTE — ED Provider Notes (Signed)
[facility]    CSN: [id number] Arrival date & time: 07/12/21  1047      History   Chief Complaint Chief Complaint  Patient presents with   Fall    HPI Abigail [name] is a 81 y.o. female.   HPI [place] states she has been having some sinus congestion and ear pressure.  She thinks that is throwing her balance off.  She had a fall in her home 3 days ago.  She states that she fell forward and landed on both knees.  She has pain in her knees but also pain in her mid back.  She points to her lower thoracic region just below the brassiere centrally.  She states that this pain is "severe".  Hurts with movement.  Hurts with breathing.  Also has sore muscles in her neck.  Is able to walk without assistance  Past Medical History:  Diagnosis Date   Arthritis    shoulder, knee,    Boils    near vaginal area   Chronic kidney disease    stage 3; low kidney function; BUN was high acc to pt   Constipation due to pain medication    Depression    Diabetes mellitus    Type 2   Family hx of colon cancer    Fibromyalgia    Frequency of urination    Frequent urination at night    GERD (gastroesophageal reflux disease)    pt denies   Heart murmur    History of bladder infections    Hyperlipidemia    Hypertension    Hypothyroidism    Itchy eyes    Obesity    Obesity    PONV (postoperative nausea and vomiting)    with stapedectomy and    Torn rotator cuff left   current    Patient Active Problem List   Diagnosis Date Noted   Postoperative state 02/26/2020   Furuncle of vulva 12/06/2016   Bilateral carpal tunnel syndrome 11/23/2016   Neck pain 11/23/2016   Primary osteoarthritis of both first carpometacarpal joints 11/23/2016   Diabetic nephropathy ([place]) 08/18/2016   Right knee pain 01/03/2016   Asymmetrical hearing loss of both ears 02/27/2015   Pressure ulcer 11/10/2014   Spondylolisthesis of lumbar region 11/07/2014   CKD (chronic kidney disease), stage III  ([place]) 07/24/2013   Chest pain 07/23/2013   Chronic pain 07/23/2013   Fibromyalgia 07/23/2013   Leukocytosis 02/07/2013   Weakness generalized 02/07/2013   Acute encephalopathy 02/05/2013   Anemia 02/05/2013   Acute renal failure ([place]) 02/05/2013   Dehydration 09/12/2012   Diabetes mellitus ([place]) 09/12/2012   Hypertension 09/12/2012   Hypothyroidism 09/12/2012   Hyperlipidemia 09/12/2012    Past Surgical History:  Procedure Laterality Date   BACK SURGERY     lumbar fusion   BLADDER SUSPENSION N/A 02/26/2020   Procedure: TRANSVAGINAL TAPE (TVT) PROCEDURE;  Surgeon: Bobbye Charleston, MD;  Location: [facility];  Service: Gynecology;  Laterality: N/A;   CARPAL TUNNEL RELEASE Right 06/29/2017   Procedure: RIGHT CARPAL TUNNEL RELEASE;  Surgeon: Daryll Brod, MD;  Location: [facility];  Service: Orthopedics;  Laterality: Right;   CHOLECYSTECTOMY  1971   COLONOSCOPY     CYSTOCELE REPAIR N/A 02/26/2020   Procedure: ANTERIOR REPAIR (CYSTOCELE);  Surgeon: Bobbye Charleston, MD;  Location: [facility];  Service: Gynecology;  Laterality: N/A;   CYSTOSCOPY N/A 02/26/2020   Procedure: CYSTOSCOPY;  Surgeon: Bobbye Charleston, MD;  Location: [facility];  Service: Gynecology;  Laterality: N/A;   DILATION AND CURETTAGE OF UTERUS     EYE SURGERY Bilateral 2020   MAXIMUM ACCESS (MAS)POSTERIOR LUMBAR INTERBODY FUSION (PLIF) 1 LEVEL N/A 11/07/2014   Procedure: Lumbar Four-Five Maximum access posterior lumbar interbody fusion;  Surgeon: Erline Levine, MD;  Location: [facility];  Service: Neurosurgery;  Laterality: N/A;  L4-5 Maximum access posterior lumbar interbody fusion   SHOULDER SURGERY     bilateral rotator cuff repairs   STAPEDECTOMY Left    ear   TUBAL LIGATION  1971   VAGINAL HYSTERECTOMY N/A 02/26/2020   Procedure: HYSTERECTOMY VAGINAL;  Surgeon: Bobbye Charleston, MD;  Location: [facility];  Service: Gynecology;   Laterality: N/A;    OB History   No obstetric history on file.      Home Medications    Prior to Admission medications   Medication Sig Start Date End Date Taking? Authorizing Provider  aspirin 81 MG chewable tablet Chew by mouth daily.   Yes [provider]  Cholecalciferol (VITAMIN D3) 1.25 MG (50000 UT) CAPS Take by mouth.   Yes [provider]  tolterodine (DETROL) 2 MG tablet Take 2 mg by mouth 2 (two) times daily.   Yes [provider]  traMADol (ULTRAM) 50 MG tablet Take by mouth every 6 (six) hours as needed.   Yes [provider]  acetaminophen (TYLENOL) 500 MG tablet Take 1,000 mg by mouth every 6 (six) hours as needed for moderate pain or headache.    [provider]  amitriptyline (ELAVIL) 10 MG tablet Take 20 mg by mouth at bedtime.     [provider]  amLODipine (NORVASC) 5 MG tablet Take 5 mg by mouth daily.     [provider]  B Complex-C (B-COMPLEX WITH VITAMIN C) tablet Take 1 tablet by mouth every evening.    [provider]  carvedilol (COREG) 6.25 MG tablet Take 6.25 mg by mouth 2 (two) times daily.     [provider]  Cyanocobalamin (VITAMIN B-12 IJ) Inject 1,000 mg as directed every 28 (twenty-eight) days.     [provider]  DULoxetine (CYMBALTA) 60 MG capsule Take 60 mg by mouth 2 (two) times daily.    [provider]  fluticasone (FLONASE) 50 MCG/ACT nasal spray Place 1-2 sprays into the nose daily as needed for allergies.     [provider]  gabapentin (NEURONTIN) 400 MG capsule Take 1 capsule (400 mg total) by mouth 3 (three) times daily. Patient taking differently: Take 400-800 mg by mouth See admin instructions. Take 400 mg in the morning, 400 mg at lunch, and 800 mg at supper 02/06/13   Domenic Polite, MD  LEVEMIR FLEXTOUCH 100 UNIT/ML Pen Inject 53 Units into the skin every morning.  07/25/14   [provider]  levothyroxine (SYNTHROID)  175 MCG tablet Take 175 mcg by mouth daily before breakfast.    [provider]  losartan-hydrochlorothiazide (HYZAAR) 100-25 MG per tablet Take 1 tablet by mouth daily. For Hypertension    [provider]  Melatonin 3 MG CAPS Take 3 mg by mouth at bedtime.     [provider]  mometasone (ELOCON) 0.1 % cream Apply 1 application topically daily as needed (rash).    [provider]  Multiple Vitamin (MULTIVITAMIN) tablet Take 1 tablet by mouth every evening.     [provider]  mupirocin ointment (BACTROBAN) 2 % Apply 1 application topically daily as needed (  wound care).     [provider]  polyethylene glycol (MIRALAX / GLYCOLAX) packet Take 17 g by mouth daily as needed for mild constipation.     [provider]  Polyvinyl Alcohol-Povidone (REFRESH OP) Place 1 drop into both eyes daily as needed (dry eyes).    [provider]  pravastatin (PRAVACHOL) 80 MG tablet Take 80 mg by mouth daily.    [provider]  Probiotic Product (PROBIOTIC DAILY PO) Take 1 capsule by mouth daily.    [provider]  tiZANidine (ZANAFLEX) 4 MG capsule Take 4 mg by mouth at bedtime as needed for muscle spasms.     [provider]    Family History Family History  Problem Relation Age of Onset   Colon cancer Brother    Diabetes Mother        and Sister    Uterine cancer Sister     Social History Social History   Tobacco Use   Smoking status: Never   Smokeless tobacco: Never  Vaping Use   Vaping Use: Never used  Substance Use Topics   Alcohol use: No   Drug use: No     Allergies   Nsaids, Onglyza [saxagliptin], Penicillins, Polysporin [bacitracin-polymyxin b], Cephalosporins, Cortisporin [bacitra-neomycin-polymyxin-hc], Ketoconazole, Neosporin plus max st, Other, Polymyxin b, Tioconazole, Corticotropin, Lisinopril, Mucinex [guaifenesin er], and Victoza [liraglutide]   Review of Systems Review of  Systems See HPI  Physical Exam Triage Vital Signs ED Triage Vitals  Enc Vitals Group     BP 07/12/21 1111 131/73     Pulse Rate 07/12/21 1111 60     Resp 07/12/21 1111 16     Temp 07/12/21 1111 98.1 F (36.7 C)     Temp Source 07/12/21 1111 Oral     SpO2 07/12/21 1111 98 %     Weight 07/12/21 1113 202 lb (91.6 kg)     Height --      Head Circumference --      Peak Flow --      Pain Score 07/12/21 1112 4     Pain Loc --      Pain Edu? --      Excl. in [place]? --    No data found.  Updated Vital Signs BP 131/73 (BP Location: Left Arm)    Pulse 60    Temp 98.1 F (36.7 C) (Oral)    Resp 16    Wt 91.6 kg    SpO2 98%    BMI 35.78 kg/m    Physical Exam Constitutional:      General: She is not in acute distress.    Appearance: She is well-developed. She is obese. She is ill-appearing.  HENT:     Head: Normocephalic and atraumatic.     Right Ear: Tympanic membrane and ear canal normal.     Left Ear: Tympanic membrane and ear canal normal.     Nose: Congestion present.     Mouth/Throat:     Pharynx: Posterior oropharyngeal erythema present.     Comments: Voice is hoarse Eyes:     Conjunctiva/sclera: Conjunctivae normal.     Pupils: Pupils are equal, round, and reactive to light.  Cardiovascular:     Rate and Rhythm: Normal rate.     Heart sounds: Normal heart sounds.  Pulmonary:     Effort: Pulmonary effort is normal. No respiratory distress.     Breath sounds: Normal breath sounds. No rhonchi.  Chest:     Chest wall: No  tenderness.  Abdominal:     General: There is no distension.     Palpations: Abdomen is soft.  Musculoskeletal:        General: Normal range of motion.     Cervical back: Normal range of motion.     Comments: Both knees have faint bruises anteriorly.  Palpably tender.  Good range of motion with no instability.  There is tenderness centrally at the T11-12 region of the back.  No bruising noted.  No rib pain  Skin:    General: Skin is warm and dry.   Neurological:     Mental Status: She is alert.     UC Treatments / Results  Labs (all labs ordered are listed, but only abnormal results are displayed) Labs Reviewed - No data to display  EKG   Radiology No results found.  Procedures Procedures (including critical care time)  Medications Ordered in UC Medications - No data to display  Initial Impression / Assessment and Plan / UC Course  I have reviewed the triage vital signs and the nursing notes.  Pertinent labs & imaging results that were available during my care of the patient were reviewed by me and considered in my medical decision making (see chart for details).     Patient has bruising of both knees.  Can ambulate without assistance, no limp.  There is tenderness anteriorly but there is no instability, effusion, joint line tenderness to suggest internal injury.  I had concern for compression fracture of her thoracic spine given the central back pain that she states was more severe than the knees.  Her x-rays are negative.  She talked about having a sinus flexion but tells me she is already taken a week of Ceftin twice daily.  Symptomatic cares reviewed.  Flonase recommended Final Clinical Impressions(s) / UC Diagnoses   Final diagnoses:  Contusion of knee, unspecified laterality, initial encounter  Mid-back pain, acute  Fall, initial encounter     Discharge Instructions      Continue taking your tramadol and gabapentin for pain Try ice or heat to painful areas Follow-up with your primary care doctor     ED Prescriptions   None    PDMP not reviewed this encounter.   Raylene Everts, MD 07/12/21 1535

## 2021-07-12 NOTE — ED Triage Notes (Signed)
Pt presents with bilateral knee, hip, rt arm, and back pain after a fall that occurred on Friday.

## 2021-07-12 NOTE — Discharge Instructions (Signed)
Continue taking your tramadol and gabapentin for pain Try ice or heat to painful areas Follow-up with your primary care doctor

## 2021-07-19 DIAGNOSIS — E1140 Type 2 diabetes mellitus with diabetic neuropathy, unspecified: Secondary | ICD-10-CM | POA: Diagnosis not present

## 2021-07-19 DIAGNOSIS — R300 Dysuria: Secondary | ICD-10-CM | POA: Diagnosis not present

## 2021-07-19 DIAGNOSIS — N390 Urinary tract infection, site not specified: Secondary | ICD-10-CM | POA: Diagnosis not present

## 2021-07-19 DIAGNOSIS — R531 Weakness: Secondary | ICD-10-CM | POA: Diagnosis not present

## 2021-07-19 DIAGNOSIS — R5381 Other malaise: Secondary | ICD-10-CM | POA: Diagnosis not present

## 2021-07-19 DIAGNOSIS — Z658 Other specified problems related to psychosocial circumstances: Secondary | ICD-10-CM | POA: Diagnosis not present

## 2021-07-26 DIAGNOSIS — M25562 Pain in left knee: Secondary | ICD-10-CM | POA: Diagnosis not present

## 2021-07-26 DIAGNOSIS — M25511 Pain in right shoulder: Secondary | ICD-10-CM | POA: Diagnosis not present

## 2021-08-06 DIAGNOSIS — H90A32 Mixed conductive and sensorineural hearing loss, unilateral, left ear with restricted hearing on the contralateral side: Secondary | ICD-10-CM | POA: Diagnosis not present

## 2021-08-06 DIAGNOSIS — H90A31 Mixed conductive and sensorineural hearing loss, unilateral, right ear with restricted hearing on the contralateral side: Secondary | ICD-10-CM | POA: Diagnosis not present

## 2021-08-10 DIAGNOSIS — H918X9 Other specified hearing loss, unspecified ear: Secondary | ICD-10-CM | POA: Diagnosis not present

## 2021-08-11 DIAGNOSIS — N390 Urinary tract infection, site not specified: Secondary | ICD-10-CM | POA: Diagnosis not present

## 2021-08-11 DIAGNOSIS — R300 Dysuria: Secondary | ICD-10-CM | POA: Diagnosis not present

## 2021-08-11 DIAGNOSIS — N3090 Cystitis, unspecified without hematuria: Secondary | ICD-10-CM | POA: Diagnosis not present

## 2021-08-12 DIAGNOSIS — M4802 Spinal stenosis, cervical region: Secondary | ICD-10-CM | POA: Diagnosis not present

## 2021-08-12 DIAGNOSIS — M50123 Cervical disc disorder at C6-C7 level with radiculopathy: Secondary | ICD-10-CM | POA: Diagnosis not present

## 2021-08-12 DIAGNOSIS — M5011 Cervical disc disorder with radiculopathy,  high cervical region: Secondary | ICD-10-CM | POA: Diagnosis not present

## 2021-08-12 DIAGNOSIS — M50121 Cervical disc disorder at C4-C5 level with radiculopathy: Secondary | ICD-10-CM | POA: Diagnosis not present

## 2021-08-12 DIAGNOSIS — M48061 Spinal stenosis, lumbar region without neurogenic claudication: Secondary | ICD-10-CM | POA: Diagnosis not present

## 2021-08-12 DIAGNOSIS — M47816 Spondylosis without myelopathy or radiculopathy, lumbar region: Secondary | ICD-10-CM | POA: Diagnosis not present

## 2021-08-12 DIAGNOSIS — Z981 Arthrodesis status: Secondary | ICD-10-CM | POA: Diagnosis not present

## 2021-08-12 DIAGNOSIS — M5010 Cervical disc disorder with radiculopathy, unspecified cervical region: Secondary | ICD-10-CM | POA: Diagnosis not present

## 2021-08-12 DIAGNOSIS — M50122 Cervical disc disorder at C5-C6 level with radiculopathy: Secondary | ICD-10-CM | POA: Diagnosis not present

## 2021-08-16 DIAGNOSIS — M25511 Pain in right shoulder: Secondary | ICD-10-CM | POA: Diagnosis not present

## 2021-08-24 DIAGNOSIS — U071 COVID-19: Secondary | ICD-10-CM | POA: Diagnosis not present

## 2021-08-27 DIAGNOSIS — E039 Hypothyroidism, unspecified: Secondary | ICD-10-CM | POA: Diagnosis not present

## 2021-09-02 DIAGNOSIS — R232 Flushing: Secondary | ICD-10-CM | POA: Diagnosis not present

## 2021-09-02 DIAGNOSIS — E1169 Type 2 diabetes mellitus with other specified complication: Secondary | ICD-10-CM | POA: Diagnosis not present

## 2021-09-02 DIAGNOSIS — G629 Polyneuropathy, unspecified: Secondary | ICD-10-CM | POA: Diagnosis not present

## 2021-09-02 DIAGNOSIS — R5382 Chronic fatigue, unspecified: Secondary | ICD-10-CM | POA: Diagnosis not present

## 2021-09-02 DIAGNOSIS — Z794 Long term (current) use of insulin: Secondary | ICD-10-CM | POA: Diagnosis not present

## 2021-09-02 DIAGNOSIS — Z6836 Body mass index (BMI) 36.0-36.9, adult: Secondary | ICD-10-CM | POA: Diagnosis not present

## 2021-09-02 DIAGNOSIS — E039 Hypothyroidism, unspecified: Secondary | ICD-10-CM | POA: Diagnosis not present

## 2021-09-07 DIAGNOSIS — Z23 Encounter for immunization: Secondary | ICD-10-CM | POA: Diagnosis not present

## 2021-09-16 DIAGNOSIS — M4722 Other spondylosis with radiculopathy, cervical region: Secondary | ICD-10-CM | POA: Diagnosis not present

## 2021-10-06 DIAGNOSIS — U071 COVID-19: Secondary | ICD-10-CM | POA: Diagnosis not present

## 2021-10-22 DIAGNOSIS — N1832 Chronic kidney disease, stage 3b: Secondary | ICD-10-CM | POA: Diagnosis not present

## 2021-10-22 DIAGNOSIS — E538 Deficiency of other specified B group vitamins: Secondary | ICD-10-CM | POA: Diagnosis not present

## 2021-10-22 DIAGNOSIS — E1140 Type 2 diabetes mellitus with diabetic neuropathy, unspecified: Secondary | ICD-10-CM | POA: Diagnosis not present

## 2021-10-22 DIAGNOSIS — D510 Vitamin B12 deficiency anemia due to intrinsic factor deficiency: Secondary | ICD-10-CM | POA: Diagnosis not present

## 2021-10-22 DIAGNOSIS — E039 Hypothyroidism, unspecified: Secondary | ICD-10-CM | POA: Diagnosis not present

## 2021-10-22 DIAGNOSIS — I10 Essential (primary) hypertension: Secondary | ICD-10-CM | POA: Diagnosis not present

## 2021-10-22 DIAGNOSIS — G4700 Insomnia, unspecified: Secondary | ICD-10-CM | POA: Diagnosis not present

## 2021-10-22 DIAGNOSIS — R5383 Other fatigue: Secondary | ICD-10-CM | POA: Diagnosis not present

## 2021-10-28 DIAGNOSIS — Z6835 Body mass index (BMI) 35.0-35.9, adult: Secondary | ICD-10-CM | POA: Diagnosis not present

## 2021-10-28 DIAGNOSIS — M4802 Spinal stenosis, cervical region: Secondary | ICD-10-CM | POA: Diagnosis not present

## 2021-10-28 DIAGNOSIS — M19019 Primary osteoarthritis, unspecified shoulder: Secondary | ICD-10-CM | POA: Diagnosis not present

## 2021-10-28 DIAGNOSIS — M5417 Radiculopathy, lumbosacral region: Secondary | ICD-10-CM | POA: Diagnosis not present

## 2021-11-04 DIAGNOSIS — H52223 Regular astigmatism, bilateral: Secondary | ICD-10-CM | POA: Diagnosis not present

## 2021-11-04 DIAGNOSIS — Z961 Presence of intraocular lens: Secondary | ICD-10-CM | POA: Diagnosis not present

## 2021-11-04 DIAGNOSIS — E119 Type 2 diabetes mellitus without complications: Secondary | ICD-10-CM | POA: Diagnosis not present

## 2021-11-04 DIAGNOSIS — H524 Presbyopia: Secondary | ICD-10-CM | POA: Diagnosis not present

## 2021-11-04 DIAGNOSIS — H353132 Nonexudative age-related macular degeneration, bilateral, intermediate dry stage: Secondary | ICD-10-CM | POA: Diagnosis not present

## 2021-11-04 DIAGNOSIS — D3132 Benign neoplasm of left choroid: Secondary | ICD-10-CM | POA: Diagnosis not present

## 2021-11-04 DIAGNOSIS — H5203 Hypermetropia, bilateral: Secondary | ICD-10-CM | POA: Diagnosis not present

## 2021-12-08 DIAGNOSIS — M180 Bilateral primary osteoarthritis of first carpometacarpal joints: Secondary | ICD-10-CM | POA: Diagnosis not present

## 2021-12-15 DIAGNOSIS — M25511 Pain in right shoulder: Secondary | ICD-10-CM | POA: Diagnosis not present

## 2021-12-16 DIAGNOSIS — E559 Vitamin D deficiency, unspecified: Secondary | ICD-10-CM | POA: Diagnosis not present

## 2021-12-16 DIAGNOSIS — D510 Vitamin B12 deficiency anemia due to intrinsic factor deficiency: Secondary | ICD-10-CM | POA: Diagnosis not present

## 2021-12-16 DIAGNOSIS — E1140 Type 2 diabetes mellitus with diabetic neuropathy, unspecified: Secondary | ICD-10-CM | POA: Diagnosis not present

## 2021-12-16 DIAGNOSIS — F324 Major depressive disorder, single episode, in partial remission: Secondary | ICD-10-CM | POA: Diagnosis not present

## 2021-12-16 DIAGNOSIS — E611 Iron deficiency: Secondary | ICD-10-CM | POA: Diagnosis not present

## 2021-12-16 DIAGNOSIS — E782 Mixed hyperlipidemia: Secondary | ICD-10-CM | POA: Diagnosis not present

## 2021-12-16 DIAGNOSIS — Z0000 Encounter for general adult medical examination without abnormal findings: Secondary | ICD-10-CM | POA: Diagnosis not present

## 2021-12-16 DIAGNOSIS — E538 Deficiency of other specified B group vitamins: Secondary | ICD-10-CM | POA: Diagnosis not present

## 2021-12-16 DIAGNOSIS — E039 Hypothyroidism, unspecified: Secondary | ICD-10-CM | POA: Diagnosis not present

## 2021-12-16 DIAGNOSIS — I10 Essential (primary) hypertension: Secondary | ICD-10-CM | POA: Diagnosis not present

## 2021-12-16 DIAGNOSIS — N1832 Chronic kidney disease, stage 3b: Secondary | ICD-10-CM | POA: Diagnosis not present

## 2022-01-12 DIAGNOSIS — Z23 Encounter for immunization: Secondary | ICD-10-CM | POA: Diagnosis not present

## 2022-01-25 DIAGNOSIS — E669 Obesity, unspecified: Secondary | ICD-10-CM | POA: Diagnosis not present

## 2022-01-25 DIAGNOSIS — H00015 Hordeolum externum left lower eyelid: Secondary | ICD-10-CM | POA: Diagnosis not present

## 2022-02-08 DIAGNOSIS — Z23 Encounter for immunization: Secondary | ICD-10-CM | POA: Diagnosis not present

## 2022-02-17 DIAGNOSIS — M19019 Primary osteoarthritis, unspecified shoulder: Secondary | ICD-10-CM | POA: Diagnosis not present

## 2022-02-17 DIAGNOSIS — Z6835 Body mass index (BMI) 35.0-35.9, adult: Secondary | ICD-10-CM | POA: Diagnosis not present

## 2022-02-17 DIAGNOSIS — M5417 Radiculopathy, lumbosacral region: Secondary | ICD-10-CM | POA: Diagnosis not present

## 2022-02-26 ENCOUNTER — Encounter: Payer: Self-pay | Admitting: Emergency Medicine

## 2022-02-26 ENCOUNTER — Ambulatory Visit
Admission: EM | Admit: 2022-02-26 | Discharge: 2022-02-26 | Disposition: A | Discharge status: Home / Self Care | Payer: Medicare Other | Attending: Family Medicine | Admitting: Family Medicine

## 2022-02-26 DIAGNOSIS — L03114 Cellulitis of left upper limb: Secondary | ICD-10-CM

## 2022-02-26 DIAGNOSIS — L089 Local infection of the skin and subcutaneous tissue, unspecified: Secondary | ICD-10-CM

## 2022-02-26 MED ORDER — SULFAMETHOXAZOLE-TRIMETHOPRIM 800-160 MG PO TABS: 1.0000 | ORAL_TABLET | Freq: Two times a day (BID) | ORAL | 0 refills | Status: AC

## 2022-02-26 MED ORDER — TRAMADOL HCL 50 MG PO TABS: 50.0000 mg | ORAL_TABLET | Freq: Four times a day (QID) | ORAL | 0 refills | Status: AC | PRN

## 2022-02-26 NOTE — ED Provider Notes (Signed)
Abigail [name] CARE    CSN: [id number] Arrival date & time: 02/26/22  1406      History   Chief Complaint Chief Complaint  Patient presents with   Cat Scratch    HPI Abigail [name] is a 81 y.o. female.   HPI  Patient had a cat scratch on her left forearm 4 days ago and now it is swollen, red, and increasingly painful.  No fever or chills.  No myalgias or body aches.  Past Medical History:  Diagnosis Date   Arthritis    shoulder, knee,    Boils    near vaginal area   Chronic kidney disease    stage 3; low kidney function; BUN was high acc to pt   Constipation due to pain medication    Depression    Diabetes mellitus    Type 2   Family hx of colon cancer    Fibromyalgia    Frequency of urination    Frequent urination at night    GERD (gastroesophageal reflux disease)    pt denies   Heart murmur    History of bladder infections    Hyperlipidemia    Hypertension    Hypothyroidism    Itchy eyes    Obesity    Obesity    PONV (postoperative nausea and vomiting)    with stapedectomy and    Torn rotator cuff left   current    Patient Active Problem List   Diagnosis Date Noted   Postoperative state 02/26/2020   Furuncle of vulva 12/06/2016   Bilateral carpal tunnel syndrome 11/23/2016   Neck pain 11/23/2016   Primary osteoarthritis of both first carpometacarpal joints 11/23/2016   Diabetic nephropathy ([place]) 08/18/2016   Right knee pain 01/03/2016   Asymmetrical hearing loss of both ears 02/27/2015   Pressure ulcer 11/10/2014   Spondylolisthesis of lumbar region 11/07/2014   CKD (chronic kidney disease), stage III ([place]) 07/24/2013   Chest pain 07/23/2013   Chronic pain 07/23/2013   Fibromyalgia 07/23/2013   Leukocytosis 02/07/2013   Weakness generalized 02/07/2013   Acute encephalopathy 02/05/2013   Anemia 02/05/2013   Acute renal failure ([place]) 02/05/2013   Dehydration 09/12/2012   Diabetes mellitus ([place]) 09/12/2012   Hypertension 09/12/2012    Hypothyroidism 09/12/2012   Hyperlipidemia 09/12/2012    Past Surgical History:  Procedure Laterality Date   BACK SURGERY     lumbar fusion   BLADDER SUSPENSION N/A 02/26/2020   Procedure: TRANSVAGINAL TAPE (TVT) PROCEDURE;  Surgeon: Bobbye Charleston, MD;  Location: [place];  Service: Gynecology;  Laterality: N/A;   CARPAL TUNNEL RELEASE Right 06/29/2017   Procedure: RIGHT CARPAL TUNNEL RELEASE;  Surgeon: Daryll Brod, MD;  Location: [place];  Service: Orthopedics;  Laterality: Right;   CHOLECYSTECTOMY  1971   COLONOSCOPY     CYSTOCELE REPAIR N/A 02/26/2020   Procedure: ANTERIOR REPAIR (CYSTOCELE);  Surgeon: Bobbye Charleston, MD;  Location: [facility];  Service: Gynecology;  Laterality: N/A;   CYSTOSCOPY N/A 02/26/2020   Procedure: CYSTOSCOPY;  Surgeon: Bobbye Charleston, MD;  Location: [facility];  Service: Gynecology;  Laterality: N/A;   DILATION AND CURETTAGE OF UTERUS     EYE SURGERY Bilateral 2020   MAXIMUM ACCESS (MAS)POSTERIOR LUMBAR INTERBODY FUSION (PLIF) 1 LEVEL N/A 11/07/2014   Procedure: Lumbar Four-Five Maximum access posterior lumbar interbody fusion;  Surgeon: Erline Levine, MD;  Location: [place] [facility];  Service: Neurosurgery;  Laterality: N/A;  L4-5 Maximum access posterior  lumbar interbody fusion   SHOULDER SURGERY     bilateral rotator cuff repairs   STAPEDECTOMY Left    ear   TUBAL LIGATION  1971   VAGINAL HYSTERECTOMY N/A 02/26/2020   Procedure: HYSTERECTOMY VAGINAL;  Surgeon: Bobbye Charleston, MD;  Location: [facility];  Service: Gynecology;  Laterality: N/A;    OB History   No obstetric history on file.      Home Medications    Prior to Admission medications   Medication Sig Start Date End Date Taking? Authorizing Provider  acetaminophen (TYLENOL) 500 MG tablet Take 1,000 mg by mouth every 6 (six) hours as needed for moderate pain or headache.   Yes [provider]   amitriptyline (ELAVIL) 10 MG tablet Take 20 mg by mouth at bedtime.    Yes [provider]  amLODipine (NORVASC) 5 MG tablet Take 5 mg by mouth daily.    Yes [provider]  aspirin 81 MG chewable tablet Chew by mouth daily.   Yes [provider]  B Complex-C (B-COMPLEX WITH VITAMIN C) tablet Take 1 tablet by mouth every evening.   Yes [provider]  carvedilol (COREG) 6.25 MG tablet Take 6.25 mg by mouth 2 (two) times daily.    Yes [provider]  Cholecalciferol (VITAMIN D3) 1.25 MG (50000 UT) CAPS Take by mouth.   Yes [provider]  Cyanocobalamin (VITAMIN B-12 IJ) Inject 1,000 mg as directed every 28 (twenty-eight) days.    Yes [provider]  DULoxetine (CYMBALTA) 60 MG capsule Take 60 mg by mouth 2 (two) times daily.   Yes [provider]  fluticasone (FLONASE) 50 MCG/ACT nasal spray Place 1-2 sprays into the nose daily as needed for allergies.    Yes [provider]  gabapentin (NEURONTIN) 400 MG capsule Take 1 capsule (400 mg total) by mouth 3 (three) times daily. Patient taking differently: Take 400-800 mg by mouth See admin instructions. Take 400 mg in the morning, 400 mg at lunch, and 800 mg at supper 02/06/13  Yes Domenic Polite, MD  LEVEMIR FLEXTOUCH 100 UNIT/ML Pen Inject 53 Units into the skin every morning.  07/25/14  Yes [provider]  levothyroxine (SYNTHROID) 175 MCG tablet Take 175 mcg by mouth daily before breakfast.   Yes [provider]  losartan-hydrochlorothiazide (HYZAAR) 100-25 MG per tablet Take 1 tablet by mouth daily. For Hypertension   Yes [provider]  Melatonin 3 MG CAPS Take 3 mg by mouth at bedtime.    Yes [provider]  mometasone (ELOCON) 0.1 % cream Apply 1 application topically daily as needed (rash).   Yes [provider]  Multiple Vitamin (MULTIVITAMIN) tablet Take 1 tablet by mouth every evening.    Yes [provider]  mupirocin ointment (BACTROBAN) 2 % Apply 1 application topically daily as needed (wound care).    Yes [provider]  polyethylene glycol (MIRALAX / GLYCOLAX) packet Take 17 g by mouth daily as needed for mild constipation.    Yes [provider]  Polyvinyl Alcohol-Povidone (REFRESH OP) Place 1 drop into both eyes daily as needed (dry eyes).   Yes [provider]  pravastatin (PRAVACHOL) 80 MG tablet Take 80 mg by mouth daily.   Yes [provider]  Probiotic Product (PROBIOTIC DAILY PO) Take 1 capsule by mouth daily.   Yes [provider]  sulfamethoxazole-trimethoprim (BACTRIM DS) 800-160 MG tablet Take 1 tablet by mouth 2 (two) times daily for 7  days. 02/26/22 03/05/22 Yes Raylene Everts, MD  tiZANidine (ZANAFLEX) 4 MG capsule Take 4 mg by mouth at bedtime as needed for muscle spasms.    Yes [provider]  tolterodine (DETROL) 2 MG tablet Take 2 mg by mouth 2 (two) times daily.   Yes [provider]  traMADol (ULTRAM) 50 MG tablet Take 1 tablet (50 mg total) by mouth every 6 (six) hours as needed. 02/26/22  Yes Raylene Everts, MD    Family History Family History  Problem Relation Age of Onset   Diabetes Mother        and Sister    Uterine cancer Sister    Colon cancer Brother     Social History Social History   Tobacco Use   Smoking status: Never   Smokeless tobacco: Never  Vaping Use   Vaping Use: Never used  Substance Use Topics   Alcohol use: No   Drug use: No     Allergies   Nsaids, Onglyza [saxagliptin], Penicillins, Polysporin [bacitracin-polymyxin b], Cephalosporins, Ketoconazole, Neo-bacit-poly-lidocaine, Tioconazole, Lisinopril, Mucinex [guaifenesin er], and Victoza [liraglutide]   Review of Systems Review of Systems  See HPI Physical Exam Triage Vital Signs ED Triage Vitals  Enc Vitals Group     BP 02/26/22 1424 111/64     Pulse Rate 02/26/22 1422 62     Resp 02/26/22  1422 18     Temp 02/26/22 1422 98.8 F (37.1 C)     Temp Source 02/26/22 1422 Oral     SpO2 02/26/22 1422 94 %     Weight --      Height --      Head Circumference --      Peak Flow --      Pain Score 02/26/22 1423 3     Pain Loc --      Pain Edu? --      Excl. in [place]? --    No data found.  Updated Vital Signs BP 111/64 (BP Location: Left Arm)   Pulse 62   Temp 98.8 F (37.1 C) (Oral)   Resp 18   SpO2 94%      Physical Exam Constitutional:      General: She is in acute distress.     Appearance: She is well-developed.     Comments: Side.  Patient is coming from her husband's funeral  HENT:     Head: Normocephalic and atraumatic.  Eyes:     Conjunctiva/sclera: Conjunctivae normal.     Pupils: Pupils are equal, round, and reactive to light.  Cardiovascular:     Rate and Rhythm: Normal rate.  Pulmonary:     Effort: Pulmonary effort is normal. No respiratory distress.  Abdominal:     General: There is no distension.     Palpations: Abdomen is soft.  Musculoskeletal:        General: Normal range of motion.       Arms:     Cervical back: Normal range of motion.  Skin:    General: Skin is warm and dry.  Neurological:     Mental Status: She is alert.      UC Treatments / Results  Labs (all labs ordered are listed, but only abnormal results are displayed) Labs Reviewed - No data to display  EKG   Radiology No results found.  Procedures Procedures (including critical care time)  Medications Ordered in UC Medications - No data to display  Initial Impression / Assessment and Plan / UC  Course  I have reviewed the triage vital signs and the nursing notes.  Pertinent labs & imaging results that were available during my care of the patient were reviewed by me and considered in my medical decision making (see chart for details).     Patient is penicillin allergic.  We will treat with sulfa antibiotics.  Importance of going to the emergency room if she  starts feeling worse instead of better is emphasized Final Clinical Impressions(s) / UC Diagnoses   Final diagnoses:  Cellulitis of left upper extremity  Post-traumatic wound infection     Discharge Instructions      Take the antibiotic 2 times a day Take 2 doses today 1 now, and then 1 at bedtime.  Take these with food Take Tylenol for mild pain, take tramadol if needed for severe pain.  Do not drive on tramadol See your doctor if not improving in 2 to 3 days Go to the ER if you start having high fevers or body aches   ED Prescriptions     Medication Sig Dispense Auth. Provider   sulfamethoxazole-trimethoprim (BACTRIM DS) 800-160 MG tablet Take 1 tablet by mouth 2 (two) times daily for 7 days. 14 tablet Raylene Everts, MD   traMADol (ULTRAM) 50 MG tablet Take 1 tablet (50 mg total) by mouth every 6 (six) hours as needed. 15 tablet Raylene Everts, MD      I have reviewed the PDMP during this encounter.   Raylene Everts, MD 02/26/22 [id number]

## 2022-02-26 NOTE — ED Triage Notes (Signed)
Patient states that her cat scratched her left forearm x 4 days ago.  Now her arm is red, swollen and some pain.

## 2022-02-26 NOTE — Discharge Instructions (Signed)
Take the antibiotic 2 times a day Take 2 doses today 1 now, and then 1 at bedtime.  Take these with food Take Tylenol for mild pain, take tramadol if needed for severe pain.  Do not drive on tramadol See your doctor if not improving in 2 to 3 days Go to the ER if you start having high fevers or body aches

## 2022-02-27 ENCOUNTER — Telehealth: Payer: Self-pay | Admitting: Emergency Medicine

## 2022-02-27 NOTE — Telephone Encounter (Signed)
LMTRC.  Advised if doing well to disregard the call, any questions or concerns feel free to contact the office. 

## 2022-03-01 DIAGNOSIS — M5412 Radiculopathy, cervical region: Secondary | ICD-10-CM | POA: Diagnosis not present

## 2022-03-02 DIAGNOSIS — Z800 Family history of malignant neoplasm of digestive organs: Secondary | ICD-10-CM | POA: Diagnosis not present

## 2022-03-02 DIAGNOSIS — Z1211 Encounter for screening for malignant neoplasm of colon: Secondary | ICD-10-CM | POA: Diagnosis not present

## 2022-03-03 DIAGNOSIS — Z23 Encounter for immunization: Secondary | ICD-10-CM | POA: Diagnosis not present

## 2022-03-07 DIAGNOSIS — N1831 Chronic kidney disease, stage 3a: Secondary | ICD-10-CM | POA: Diagnosis not present

## 2022-03-24 DIAGNOSIS — Z1211 Encounter for screening for malignant neoplasm of colon: Secondary | ICD-10-CM | POA: Diagnosis not present

## 2022-03-24 DIAGNOSIS — Z1212 Encounter for screening for malignant neoplasm of rectum: Secondary | ICD-10-CM | POA: Diagnosis not present

## 2022-04-11 DIAGNOSIS — Z411 Encounter for cosmetic surgery: Secondary | ICD-10-CM | POA: Diagnosis not present

## 2022-04-11 DIAGNOSIS — L814 Other melanin hyperpigmentation: Secondary | ICD-10-CM | POA: Diagnosis not present

## 2022-04-11 DIAGNOSIS — L728 Other follicular cysts of the skin and subcutaneous tissue: Secondary | ICD-10-CM | POA: Diagnosis not present

## 2022-04-11 DIAGNOSIS — D225 Melanocytic nevi of trunk: Secondary | ICD-10-CM | POA: Diagnosis not present

## 2022-04-14 DIAGNOSIS — Z6837 Body mass index (BMI) 37.0-37.9, adult: Secondary | ICD-10-CM | POA: Diagnosis not present

## 2022-04-14 DIAGNOSIS — N1832 Chronic kidney disease, stage 3b: Secondary | ICD-10-CM | POA: Diagnosis not present

## 2022-04-14 DIAGNOSIS — Z794 Long term (current) use of insulin: Secondary | ICD-10-CM | POA: Diagnosis not present

## 2022-04-14 DIAGNOSIS — I10 Essential (primary) hypertension: Secondary | ICD-10-CM | POA: Diagnosis not present

## 2022-04-14 DIAGNOSIS — M1990 Unspecified osteoarthritis, unspecified site: Secondary | ICD-10-CM | POA: Diagnosis not present

## 2022-04-14 DIAGNOSIS — E782 Mixed hyperlipidemia: Secondary | ICD-10-CM | POA: Diagnosis not present

## 2022-04-14 DIAGNOSIS — E1140 Type 2 diabetes mellitus with diabetic neuropathy, unspecified: Secondary | ICD-10-CM | POA: Diagnosis not present

## 2022-04-14 DIAGNOSIS — G4700 Insomnia, unspecified: Secondary | ICD-10-CM | POA: Diagnosis not present

## 2022-04-14 DIAGNOSIS — M5430 Sciatica, unspecified side: Secondary | ICD-10-CM | POA: Diagnosis not present

## 2022-04-14 DIAGNOSIS — E039 Hypothyroidism, unspecified: Secondary | ICD-10-CM | POA: Diagnosis not present

## 2022-04-14 DIAGNOSIS — F324 Major depressive disorder, single episode, in partial remission: Secondary | ICD-10-CM | POA: Diagnosis not present

## 2022-05-04 DIAGNOSIS — M19011 Primary osteoarthritis, right shoulder: Secondary | ICD-10-CM | POA: Diagnosis not present

## 2022-05-10 DIAGNOSIS — G4700 Insomnia, unspecified: Secondary | ICD-10-CM | POA: Diagnosis not present

## 2022-05-10 DIAGNOSIS — I10 Essential (primary) hypertension: Secondary | ICD-10-CM | POA: Diagnosis not present

## 2022-05-10 DIAGNOSIS — N1832 Chronic kidney disease, stage 3b: Secondary | ICD-10-CM | POA: Diagnosis not present

## 2022-05-10 DIAGNOSIS — E782 Mixed hyperlipidemia: Secondary | ICD-10-CM | POA: Diagnosis not present

## 2022-05-10 DIAGNOSIS — E1140 Type 2 diabetes mellitus with diabetic neuropathy, unspecified: Secondary | ICD-10-CM | POA: Diagnosis not present

## 2022-05-10 DIAGNOSIS — E039 Hypothyroidism, unspecified: Secondary | ICD-10-CM | POA: Diagnosis not present

## 2022-05-10 DIAGNOSIS — M1990 Unspecified osteoarthritis, unspecified site: Secondary | ICD-10-CM | POA: Diagnosis not present

## 2022-05-10 DIAGNOSIS — F324 Major depressive disorder, single episode, in partial remission: Secondary | ICD-10-CM | POA: Diagnosis not present

## 2022-05-12 DIAGNOSIS — E1140 Type 2 diabetes mellitus with diabetic neuropathy, unspecified: Secondary | ICD-10-CM | POA: Diagnosis not present

## 2022-05-26 DIAGNOSIS — J069 Acute upper respiratory infection, unspecified: Secondary | ICD-10-CM | POA: Diagnosis not present

## 2022-06-02 DIAGNOSIS — M5412 Radiculopathy, cervical region: Secondary | ICD-10-CM | POA: Diagnosis not present

## 2022-06-02 DIAGNOSIS — M5417 Radiculopathy, lumbosacral region: Secondary | ICD-10-CM | POA: Diagnosis not present

## 2022-06-16 DIAGNOSIS — M25561 Pain in right knee: Secondary | ICD-10-CM | POA: Diagnosis not present

## 2022-06-20 DIAGNOSIS — M1711 Unilateral primary osteoarthritis, right knee: Secondary | ICD-10-CM | POA: Diagnosis not present

## 2022-06-20 DIAGNOSIS — M25561 Pain in right knee: Secondary | ICD-10-CM | POA: Diagnosis not present

## 2022-06-22 DIAGNOSIS — M25561 Pain in right knee: Secondary | ICD-10-CM | POA: Diagnosis not present

## 2022-06-24 DIAGNOSIS — E782 Mixed hyperlipidemia: Secondary | ICD-10-CM | POA: Diagnosis not present

## 2022-06-24 DIAGNOSIS — E1140 Type 2 diabetes mellitus with diabetic neuropathy, unspecified: Secondary | ICD-10-CM | POA: Diagnosis not present

## 2022-06-24 DIAGNOSIS — I10 Essential (primary) hypertension: Secondary | ICD-10-CM | POA: Diagnosis not present

## 2022-06-24 DIAGNOSIS — F324 Major depressive disorder, single episode, in partial remission: Secondary | ICD-10-CM | POA: Diagnosis not present

## 2022-06-24 DIAGNOSIS — E039 Hypothyroidism, unspecified: Secondary | ICD-10-CM | POA: Diagnosis not present

## 2022-06-24 DIAGNOSIS — D500 Iron deficiency anemia secondary to blood loss (chronic): Secondary | ICD-10-CM | POA: Diagnosis not present

## 2022-06-24 DIAGNOSIS — M1990 Unspecified osteoarthritis, unspecified site: Secondary | ICD-10-CM | POA: Diagnosis not present

## 2022-06-24 DIAGNOSIS — N1832 Chronic kidney disease, stage 3b: Secondary | ICD-10-CM | POA: Diagnosis not present

## 2022-06-28 DIAGNOSIS — R5381 Other malaise: Secondary | ICD-10-CM | POA: Diagnosis not present

## 2022-06-28 DIAGNOSIS — J019 Acute sinusitis, unspecified: Secondary | ICD-10-CM | POA: Diagnosis not present

## 2022-06-28 DIAGNOSIS — F324 Major depressive disorder, single episode, in partial remission: Secondary | ICD-10-CM | POA: Diagnosis not present

## 2022-06-28 DIAGNOSIS — Z6839 Body mass index (BMI) 39.0-39.9, adult: Secondary | ICD-10-CM | POA: Diagnosis not present

## 2022-07-11 DIAGNOSIS — N1832 Chronic kidney disease, stage 3b: Secondary | ICD-10-CM | POA: Diagnosis not present

## 2022-07-11 DIAGNOSIS — M19049 Primary osteoarthritis, unspecified hand: Secondary | ICD-10-CM | POA: Diagnosis not present

## 2022-07-11 DIAGNOSIS — E1140 Type 2 diabetes mellitus with diabetic neuropathy, unspecified: Secondary | ICD-10-CM | POA: Diagnosis not present

## 2022-07-11 DIAGNOSIS — Z6839 Body mass index (BMI) 39.0-39.9, adult: Secondary | ICD-10-CM | POA: Diagnosis not present

## 2022-07-11 DIAGNOSIS — F324 Major depressive disorder, single episode, in partial remission: Secondary | ICD-10-CM | POA: Diagnosis not present

## 2022-07-20 ENCOUNTER — Other Ambulatory Visit: Payer: Self-pay | Admitting: Family Medicine

## 2022-07-20 DIAGNOSIS — R109 Unspecified abdominal pain: Secondary | ICD-10-CM

## 2022-08-10 ENCOUNTER — Ambulatory Visit
Admission: RE | Admit: 2022-08-10 | Discharge: 2022-08-10 | Disposition: A | Discharge status: Home / Self Care | Payer: Medicare Other | Source: Ambulatory Visit | Attending: Family Medicine | Admitting: Family Medicine

## 2022-08-10 DIAGNOSIS — N281 Cyst of kidney, acquired: Secondary | ICD-10-CM | POA: Diagnosis not present

## 2022-08-10 DIAGNOSIS — Z9049 Acquired absence of other specified parts of digestive tract: Secondary | ICD-10-CM | POA: Diagnosis not present

## 2022-08-10 DIAGNOSIS — R109 Unspecified abdominal pain: Secondary | ICD-10-CM

## 2022-08-10 DIAGNOSIS — K838 Other specified diseases of biliary tract: Secondary | ICD-10-CM | POA: Diagnosis not present

## 2022-08-15 DIAGNOSIS — M180 Bilateral primary osteoarthritis of first carpometacarpal joints: Secondary | ICD-10-CM | POA: Diagnosis not present

## 2022-08-17 DIAGNOSIS — M25511 Pain in right shoulder: Secondary | ICD-10-CM | POA: Diagnosis not present

## 2022-08-22 DIAGNOSIS — Z1231 Encounter for screening mammogram for malignant neoplasm of breast: Secondary | ICD-10-CM | POA: Diagnosis not present

## 2022-08-22 DIAGNOSIS — N760 Acute vaginitis: Secondary | ICD-10-CM | POA: Diagnosis not present

## 2022-08-22 DIAGNOSIS — N898 Other specified noninflammatory disorders of vagina: Secondary | ICD-10-CM | POA: Diagnosis not present

## 2022-08-29 ENCOUNTER — Encounter (HOSPITAL_COMMUNITY): Payer: Self-pay

## 2022-08-29 ENCOUNTER — Other Ambulatory Visit: Payer: Self-pay

## 2022-08-29 ENCOUNTER — Emergency Department (HOSPITAL_COMMUNITY): Payer: Medicare Other

## 2022-08-29 ENCOUNTER — Emergency Department (HOSPITAL_COMMUNITY)
Admission: EM | Admit: 2022-08-29 | Discharge: 2022-08-29 | Disposition: A | Discharge status: Home / Self Care | Payer: Medicare Other | Attending: Emergency Medicine | Admitting: Emergency Medicine

## 2022-08-29 DIAGNOSIS — S0093XA Contusion of unspecified part of head, initial encounter: Secondary | ICD-10-CM | POA: Diagnosis not present

## 2022-08-29 DIAGNOSIS — E876 Hypokalemia: Secondary | ICD-10-CM | POA: Diagnosis not present

## 2022-08-29 DIAGNOSIS — S60221A Contusion of right hand, initial encounter: Secondary | ICD-10-CM | POA: Diagnosis not present

## 2022-08-29 DIAGNOSIS — G4489 Other headache syndrome: Secondary | ICD-10-CM | POA: Diagnosis not present

## 2022-08-29 DIAGNOSIS — Z79899 Other long term (current) drug therapy: Secondary | ICD-10-CM | POA: Insufficient documentation

## 2022-08-29 DIAGNOSIS — S0083XA Contusion of other part of head, initial encounter: Secondary | ICD-10-CM

## 2022-08-29 DIAGNOSIS — R531 Weakness: Secondary | ICD-10-CM | POA: Diagnosis not present

## 2022-08-29 DIAGNOSIS — Y9301 Activity, walking, marching and hiking: Secondary | ICD-10-CM | POA: Diagnosis not present

## 2022-08-29 DIAGNOSIS — W19XXXA Unspecified fall, initial encounter: Secondary | ICD-10-CM | POA: Diagnosis not present

## 2022-08-29 DIAGNOSIS — M4312 Spondylolisthesis, cervical region: Secondary | ICD-10-CM | POA: Diagnosis not present

## 2022-08-29 DIAGNOSIS — I129 Hypertensive chronic kidney disease with stage 1 through stage 4 chronic kidney disease, or unspecified chronic kidney disease: Secondary | ICD-10-CM | POA: Diagnosis not present

## 2022-08-29 DIAGNOSIS — N1832 Chronic kidney disease, stage 3b: Secondary | ICD-10-CM | POA: Diagnosis not present

## 2022-08-29 DIAGNOSIS — S0003XA Contusion of scalp, initial encounter: Secondary | ICD-10-CM | POA: Diagnosis not present

## 2022-08-29 DIAGNOSIS — S022XXA Fracture of nasal bones, initial encounter for closed fracture: Secondary | ICD-10-CM

## 2022-08-29 DIAGNOSIS — S0992XA Unspecified injury of nose, initial encounter: Secondary | ICD-10-CM | POA: Diagnosis present

## 2022-08-29 DIAGNOSIS — W01198A Fall on same level from slipping, tripping and stumbling with subsequent striking against other object, initial encounter: Secondary | ICD-10-CM | POA: Diagnosis not present

## 2022-08-29 DIAGNOSIS — N189 Chronic kidney disease, unspecified: Secondary | ICD-10-CM | POA: Diagnosis not present

## 2022-08-29 DIAGNOSIS — M47812 Spondylosis without myelopathy or radiculopathy, cervical region: Secondary | ICD-10-CM | POA: Diagnosis not present

## 2022-08-29 DIAGNOSIS — W010XXA Fall on same level from slipping, tripping and stumbling without subsequent striking against object, initial encounter: Secondary | ICD-10-CM

## 2022-08-29 DIAGNOSIS — M79641 Pain in right hand: Secondary | ICD-10-CM | POA: Diagnosis not present

## 2022-08-29 LAB — CBC
HCT: 38.2 % (ref 36.0–46.0)
Hemoglobin: 12.2 g/dL (ref 12.0–15.0)
MCH: 30 pg (ref 26.0–34.0)
MCHC: 31.9 g/dL (ref 30.0–36.0)
MCV: 94.1 fL (ref 80.0–100.0)
Platelets: 251 10*3/uL (ref 150–400)
RBC: 4.06 MIL/uL (ref 3.87–5.11)
RDW: 12.8 % (ref 11.5–15.5)
WBC: 8 10*3/uL (ref 4.0–10.5)
nRBC: 0 % (ref 0.0–0.2)

## 2022-08-29 LAB — BASIC METABOLIC PANEL
Anion gap: 13 (ref 5–15)
BUN: 38 mg/dL — ABNORMAL HIGH (ref 8–23)
CO2: 24 mmol/L (ref 22–32)
Calcium: 8.9 mg/dL (ref 8.9–10.3)
Chloride: 101 mmol/L (ref 98–111)
Creatinine, Ser: 1.85 mg/dL — ABNORMAL HIGH (ref 0.44–1.00)
GFR, Estimated: 27 mL/min — ABNORMAL LOW (ref >60)
Glucose, Bld: 200 mg/dL — ABNORMAL HIGH (ref 70–99)
Potassium: 3.3 mmol/L — ABNORMAL LOW (ref 3.5–5.1)
Sodium: 138 mmol/L (ref 135–145)

## 2022-08-29 MED ORDER — POTASSIUM CHLORIDE CRYS ER 20 MEQ PO TBCR
40.0000 meq | EXTENDED_RELEASE_TABLET | Freq: Once | ORAL | Status: AC
  Administered 2022-08-29: 40 meq via ORAL
  Filled 2022-08-29: qty 2

## 2022-08-29 MED ORDER — SODIUM CHLORIDE 0.9 % IV BOLUS
500.0000 mL | Freq: Once | INTRAVENOUS | Status: AC
  Administered 2022-08-29: 500 mL via INTRAVENOUS

## 2022-08-29 MED ORDER — ACETAMINOPHEN 500 MG PO TABS
1000.0000 mg | ORAL_TABLET | Freq: Once | ORAL | Status: AC
  Administered 2022-08-29: 1000 mg via ORAL
  Filled 2022-08-29: qty 2

## 2022-08-29 NOTE — ED Notes (Addendum)
Trauma Response Nurse Documentation   Abigail [name] is a 82 y.o. female arriving to [facility] ED via EMS  On aspirin 81mg  daily. Trauma was activated as a Level 2 by ED Charge RN based on the following trauma criteria GCS 10-14 associated with trauma or AVPU < A. Trauma team at the bedside on patient arrival.   Patient cleared for CT by Dr. Ashok Cordia. Pt transported to CT with trauma response nurse present to monitor. RN remained with the patient throughout their absence from the department for clinical observation.   GCS 14.  History   Past Medical History:  Diagnosis Date   Arthritis    shoulder, knee,    Boils    near vaginal area   Chronic kidney disease    stage 3; low kidney function; BUN was high acc to pt   Constipation due to pain medication    Depression    Diabetes mellitus    Type 2   Family hx of colon cancer    Fibromyalgia    Frequency of urination    Frequent urination at night    GERD (gastroesophageal reflux disease)    pt denies   Heart murmur    History of bladder infections    Hyperlipidemia    Hypertension    Hypothyroidism    Itchy eyes    Obesity    Obesity    PONV (postoperative nausea and vomiting)    with stapedectomy and    Torn rotator cuff left   current     Past Surgical History:  Procedure Laterality Date   BACK SURGERY     lumbar fusion   BLADDER SUSPENSION N/A 02/26/2020   Procedure: TRANSVAGINAL TAPE (TVT) PROCEDURE;  Surgeon: Bobbye Charleston, MD;  Location: [place];  Service: Gynecology;  Laterality: N/A;   CARPAL TUNNEL RELEASE Right 06/29/2017   Procedure: RIGHT CARPAL TUNNEL RELEASE;  Surgeon: Daryll Brod, MD;  Location: [place];  Service: Orthopedics;  Laterality: Right;   CHOLECYSTECTOMY  1971   COLONOSCOPY     CYSTOCELE REPAIR N/A 02/26/2020   Procedure: ANTERIOR REPAIR (CYSTOCELE);  Surgeon: Bobbye Charleston, MD;  Location[facility];  Service: Gynecology;   Laterality: N/A;   CYSTOSCOPY N/A 02/26/2020   Procedure: CYSTOSCOPY;  Surgeon: Bobbye Charleston, MD;  Location: [facility];  Service: Gynecology;  Laterality: N/A;   DILATION AND CURETTAGE OF UTERUS     EYE SURGERY Bilateral 2020   MAXIMUM ACCESS (MAS)POSTERIOR LUMBAR INTERBODY FUSION (PLIF) 1 LEVEL N/A 11/07/2014   Procedure: Lumbar Four-Five Maximum access posterior lumbar interbody fusion;  Surgeon: Erline Levine, MD;  Location: [place] [facility];  Service: Neurosurgery;  Laterality: N/A;  L4-5 Maximum access posterior lumbar interbody fusion   SHOULDER SURGERY     bilateral rotator cuff repairs   STAPEDECTOMY Left    ear   TUBAL LIGATION  1971   VAGINAL HYSTERECTOMY N/A 02/26/2020   Procedure: HYSTERECTOMY VAGINAL;  Surgeon: Bobbye Charleston, MD;  Location: [facility];  Service: Gynecology;  Laterality: N/A;     Initial Focused Assessment (If applicable, or please see trauma documentation): - GCS 14 slight confusion - R pupil 3 brisk, L pupil 2 brisk - Bruising to L eye - Hematoma to L forehead - abrasions to nose - Abrasions to hands - PIV to L AC  CT's Completed:   CT Head, CT Maxillofacial, and CT C-Spine   Interventions:  - labs drawn - CT head,  C-spine and max face  Plan for disposition:  Other   Consults completed:  none at 1430.  Event Summary: Pt had a witnessed fall onto concrete after tripping.  Pt fell straight onto there face.  No LOC.  No thinners.  Pt c/o 3/10 HA and facial pain.  Abrasions to nose and both hands.  Also has hematoma to L forehead and L eye bruising.  MTP Summary (If applicable):   Bedside handoff with ED RN [place] Ar.    Clovis Cao  Trauma Response RN  Please call TRN at [phone] for further assistance.

## 2022-08-29 NOTE — ED Notes (Signed)
Pt able to ambulate to and from the bathroom with no complaints. Will proceeed with discharge. Instructions went over with pt and pt's son.

## 2022-08-29 NOTE — ED Notes (Signed)
Patient transported to CT by trauma RN on monitor

## 2022-08-29 NOTE — ED Triage Notes (Signed)
Pt brought in by EMS after pt had a fall after tripping on the sidewalk walking into her pain center. Pt has chronic knee/back pain. Abrasions to bilateral hands and face, no active bleeding noted. Pt has been a little slow to respond but Aox3, not oriented to the year

## 2022-08-29 NOTE — Progress Notes (Signed)
   08/29/22 1350  Spiritual Encounters  Type of Visit Initial  Care provided to: Pt not available  Referral source Trauma page  Reason for visit Trauma   Chaplain responded to a level two trauma. The patient, Abigail [name] was attended to by the medical team. No family is present. If a chaplain is requested someone will respond.   Danice Goltz [facility]  [phone]

## 2022-08-29 NOTE — Discharge Instructions (Addendum)
It was our pleasure to provide your ER care today - we hope that you feel better.  Your ct scan shows a nasal bone fracture.  Incidental note was also made of: 1. No evidence of acute fracture to the cervical spine. 2. Nonspecific straightening of the expected cervical lordosis. 3. Slight grade 1 anterolisthesis at C2-C3. 4. Cervical spondylosis as described. 5. 2 cm posterior paraspinal mass on the right at the C3-C4 level, as described and present on prior examinations dating back to at least 2011. Although indeterminate in etiology, this is almost certainly benign and could reflect a vascular malformation   From today's labs (and prior labs from October), your kidney function/creatinine test is mildly high. Drink plenty of fluids/stay well hydrated. Avoid taking nsaid-type meds such as ibuprofen/motrin or aleve/naprosyn - follow up with your doctor for recheck   Fall precautions. Use great care/caution, and/or cane or walker to help minimize risk of falling.   Take acetaminophen as need. Icepack to sore area.   For nasal fracture, follow up with ENT doctor. Also follow up with primary care doctor in 1-2 weeks. From the lab tests, your potassium level is mildly low - eat plenty of fruits and vegetables and follow up with your doctor.   Return to ER right away if worse, new symptoms, fevers, new/severe pain,  severe headache, chest pain, trouble breathing, weak/fainting, or other concern.

## 2022-08-29 NOTE — ED Provider Notes (Signed)
[facility] Provider Note   CSN: [id number] Arrival date & time: 08/29/22  1347     History {Add pertinent medical, surgical, social history, OB history to HPI:1} Chief Complaint  Patient presents with   Lytle Michaels    Abigail [name] is a 82 y.o. female.  Pt s/p fall. Was walking on sidewalk, states tripped over feet, fell forward, hit head. No loc. C/o dull headache since. EMS was called, indicates pt seems to have become more confused in transport, repetitive questioning. Pt denies any headache prior to fall. Denies any new numbness/weakness or problems w speech or vision. No faintness or dizziness prior to fall. Hit head/forehead. Contusion to area. Denies neck/back pain. No extremity pain or injury. On asa, no other anticoagulant use. Nose did bleed after hitting head/face on ground - stopped w pressure.  EMS notes cbg 200.   The history is provided by the patient, medical records and the EMS personnel.  Fall Associated symptoms include headaches. Pertinent negatives include no chest pain, no abdominal pain and no shortness of breath.       Home Medications Prior to Admission medications   Medication Sig Start Date End Date Taking? Authorizing Provider  acetaminophen (TYLENOL) 500 MG tablet Take 1,000 mg by mouth every 6 (six) hours as needed for moderate pain or headache.    [provider]  amitriptyline (ELAVIL) 10 MG tablet Take 20 mg by mouth at bedtime.     [provider]  amLODipine (NORVASC) 5 MG tablet Take 5 mg by mouth daily.     [provider]  aspirin 81 MG chewable tablet Chew by mouth daily.    [provider]  B Complex-C (B-COMPLEX WITH VITAMIN C) tablet Take 1 tablet by mouth every evening.    [provider]  carvedilol (COREG) 6.25 MG tablet Take 6.25 mg by mouth 2 (two) times daily.     [provider]  Cholecalciferol (VITAMIN D3) 1.25 MG (50000 UT) CAPS Take by  mouth.    [provider]  Cyanocobalamin (VITAMIN B-12 IJ) Inject 1,000 mg as directed every 28 (twenty-eight) days.     [provider]  DULoxetine (CYMBALTA) 60 MG capsule Take 60 mg by mouth 2 (two) times daily.    [provider]  fluticasone (FLONASE) 50 MCG/ACT nasal spray Place 1-2 sprays into the nose daily as needed for allergies.     [provider]  gabapentin (NEURONTIN) 400 MG capsule Take 1 capsule (400 mg total) by mouth 3 (three) times daily. Patient taking differently: Take 400-800 mg by mouth See admin instructions. Take 400 mg in the morning, 400 mg at lunch, and 800 mg at supper 02/06/13   Domenic Polite, MD  LEVEMIR FLEXTOUCH 100 UNIT/ML Pen Inject 53 Units into the skin every morning.  07/25/14   [provider]  levothyroxine (SYNTHROID) 175 MCG tablet Take 175 mcg by mouth daily before breakfast.    [provider]  losartan-hydrochlorothiazide (HYZAAR) 100-25 MG per tablet Take 1 tablet by mouth daily. For Hypertension    [provider]  Melatonin 3 MG CAPS Take 3 mg by mouth at bedtime.     [provider]  mometasone (ELOCON) 0.1 % cream Apply 1 application topically daily as needed (rash).    [provider]  Multiple Vitamin (MULTIVITAMIN) tablet Take 1 tablet by mouth every evening.     [provider]  mupirocin ointment (BACTROBAN) 2 % Apply  1 application topically daily as needed (wound care).     [provider]  polyethylene glycol (MIRALAX / GLYCOLAX) packet Take 17 g by mouth daily as needed for mild constipation.     [provider]  Polyvinyl Alcohol-Povidone (REFRESH OP) Place 1 drop into both eyes daily as needed (dry eyes).    [provider]  pravastatin (PRAVACHOL) 80 MG tablet Take 80 mg by mouth daily.    [provider]  Probiotic Product (PROBIOTIC DAILY PO) Take 1 capsule by mouth daily.    [provider]  tiZANidine  (ZANAFLEX) 4 MG capsule Take 4 mg by mouth at bedtime as needed for muscle spasms.     [provider]  tolterodine (DETROL) 2 MG tablet Take 2 mg by mouth 2 (two) times daily.    [provider]  traMADol (ULTRAM) 50 MG tablet Take 1 tablet (50 mg total) by mouth every 6 (six) hours as needed. 02/26/22   Raylene Everts, MD      Allergies    Nsaids, Onglyza [saxagliptin], Penicillins, Polysporin [bacitracin-polymyxin b], Cephalosporins, Ketoconazole, Neo-bacit-poly-lidocaine, Tioconazole, Lisinopril, Mucinex [guaifenesin er], and Victoza [liraglutide]    Review of Systems   Review of Systems  Constitutional:  Negative for fever.  HENT:  Positive for nosebleeds.   Eyes:  Negative for pain and visual disturbance.  Respiratory:  Negative for shortness of breath.   Cardiovascular:  Negative for chest pain, palpitations and leg swelling.  Gastrointestinal:  Negative for abdominal pain, nausea and vomiting.  Genitourinary:  Negative for flank pain.  Musculoskeletal:  Negative for back pain and neck pain.  Skin:  Negative for rash.  Neurological:  Positive for headaches. Negative for weakness and numbness.  Hematological:  Does not bruise/bleed easily.  Psychiatric/Behavioral:  Positive for confusion.     Physical Exam Updated Vital Signs BP (!) 129/49   Pulse (!) 52   Temp 97.7 F (36.5 C) (Oral)   Resp 16   Ht 1.6 m (5\' 3" )   Wt 97.5 kg   SpO2 94%   BMI 38.09 kg/m  Physical Exam Vitals and nursing note reviewed.  Constitutional:      Appearance: Normal appearance. She is well-developed.  HENT:     Head:     Comments: Contusion left forehead and eyebrow area as well as nose. No nasal septal hematoma noted, no active bleeding.     Nose: Nose normal.     Mouth/Throat:     Mouth: Mucous membranes are moist.  Eyes:     General: No scleral icterus.    Extraocular Movements: Extraocular movements intact.     Conjunctiva/sclera: Conjunctivae normal.      Pupils: Pupils are equal, round, and reactive to light.  Neck:     Vascular: No carotid bruit.     Trachea: No tracheal deviation.  Cardiovascular:     Rate and Rhythm: Normal rate and regular rhythm.     Pulses: Normal pulses.     Heart sounds: Normal heart sounds. No murmur heard.    No friction rub. No gallop.  Pulmonary:     Effort: Pulmonary effort is normal. No respiratory distress.     Breath sounds: Normal breath sounds.  Chest:     Chest wall: No tenderness.  Abdominal:     General: Bowel sounds are normal. There is no distension.     Palpations: Abdomen is soft.     Tenderness: There is no abdominal tenderness.     Comments:  No abd contusion or bruising.   Genitourinary:    Comments: No cva tenderness.  Musculoskeletal:        General: No swelling.     Cervical back: Normal range of motion and neck supple. No rigidity. No muscular tenderness.     Comments: Mid cervical tenderness, otherwise CTLS spine, non tender, aligned, no step off. Good rom bil extremities without pain or focal bony tenderness.   Skin:    General: Skin is warm and dry.     Findings: No rash.  Neurological:     Mental Status: She is alert.     Comments: Alert, speech normal. GCS 15. Motor/sens grossly intact bil.   Psychiatric:        Mood and Affect: Mood normal.     ED Results / Procedures / Treatments   Labs (all labs ordered are listed, but only abnormal results are displayed) Results for orders placed or performed during the hospital encounter of [id number]  Basic metabolic panel  Result Value Ref Range   Sodium 138 135 - 145 mmol/L   Potassium 3.3 (L) 3.5 - 5.1 mmol/L   Chloride 101 98 - 111 mmol/L   CO2 24 22 - 32 mmol/L   Glucose, Bld 200 (H) 70 - 99 mg/dL   BUN 38 (H) 8 - 23 mg/dL   Creatinine, Ser 1.85 (H) 0.44 - 1.00 mg/dL   Calcium 8.9 8.9 - 10.3 mg/dL   GFR, Estimated 27 (L) >60 mL/min   Anion gap 13 5 - 15  CBC  Result Value Ref Range   WBC 8.0 4.0 - 10.5 K/uL   RBC  4.06 3.87 - 5.11 MIL/uL   Hemoglobin 12.2 12.0 - 15.0 g/dL   HCT 38.2 36.0 - 46.0 %   MCV 94.1 80.0 - 100.0 fL   MCH 30.0 26.0 - 34.0 pg   MCHC 31.9 30.0 - 36.0 g/dL   RDW 12.8 11.5 - 15.5 %   Platelets 251 150 - 400 K/uL   nRBC 0.0 0.0 - 0.2 %   CT Head Wo Contrast  Result Date: 08/29/2022 CLINICAL DATA:  Provided history: Head trauma, moderate/severe. Fall. EXAM: CT HEAD WITHOUT CONTRAST CT MAXILLOFACIAL WITHOUT CONTRAST CT CERVICAL SPINE WITHOUT CONTRAST TECHNIQUE: Multidetector CT imaging of the head, cervical spine, and maxillofacial structures were performed using the standard protocol without intravenous contrast. Multiplanar CT image reconstructions of the cervical spine and maxillofacial structures were also generated. RADIATION DOSE REDUCTION: This exam was performed according to the departmental dose-optimization program which includes automated exposure control, adjustment of the mA and/or kV according to patient size and/or use of iterative reconstruction technique. COMPARISON:  Prior head CT examinations 02/05/2013 and earlier. Prior cervical spine CT examinations 12/16/2016 and earlier. Report from maxillofacial CT 08/22/2012 (images unavailable). FINDINGS: CT HEAD FINDINGS Brain: Cerebral atrophy. Patchy and ill-defined hypoattenuation within the cerebral white matter, nonspecific but compatible with mild chronic small vessel ischemic disease. There is no acute intracranial hemorrhage. No demarcated cortical infarct. No extra-axial fluid collection. No evidence of an intracranial mass. No midline shift. Vascular: No hyperdense vessel. Atherosclerotic calcifications. Skull: No fracture or aggressive osseous lesion. Other: Left anterior scalp/forehead hematoma. CT MAXILLOFACIAL FINDINGS Osseous: Subtle fracture deformity of the right nasal bone, new from the prior head CT of 08/22/2012 but otherwise age indeterminate. No acute maxillofacial fracture identified elsewhere. Orbits: No acute  orbital finding. Sinuses: Mucosal thickening, and tiny mucous retention cyst, within the right sphenoid sinus. Mild Soft tissues: Nasal soft tissue swelling. Left  anterior scalp/forehead hematoma. CT CERVICAL SPINE FINDINGS Alignment: Straightening of the expected cervical lordosis. Slight C2-C3 grade 1 anterolisthesis. Skull base and vertebrae: The basion-dental and atlanto-dental intervals are maintained.No evidence of acute fracture to the cervical spine. Soft tissues and spinal canal: No prevertebral fluid or swelling. No visible canal hematoma. Disc levels: Cervical spondylosis with multilevel disc space narrowing, disc bulges/central disc protrusions, posterior disc osteophyte complexes, uncovertebral hypertrophy and facet arthrosis. Disc space narrowing is advanced at C3-C4, C4-C5, C5-C6, C6-C7 and C7-T1. Multilevel spinal canal stenosis. Most notably at C3-C4, a posterior disc osteophyte complex eccentric to the left contributes to at least moderate spinal canal stenosis. No high-grade bony neural foraminal narrowing. Upper chest: No consolidation within the imaged lung apices. No visible pneumothorax. Other: 2 cm posterior paraspinal mass on the right at the C3-C4 level with internal foci of calcification. This has been present on prior examinations dating back to at least 2011. This is almost certainly benign and may reflect a vascular malformation. IMPRESSION: CT head: 1.  No evidence of an acute intracranial abnormality. 2. Left anterior scalp/forehead hematoma. 3. Mild chronic small vessel ischemic changes within the cerebral white matter. 4. Cerebral atrophy. CT maxillofacial: 1. Subtle fracture deformity of the right nasal bone, new from the prior head CT of 08/22/2012 but otherwise age indeterminate. 2. Nasal soft tissue swelling. 3. Left anterior scalp/forehead hematoma. CT cervical spine: 1. No evidence of acute fracture to the cervical spine. 2. Nonspecific straightening of the expected cervical  lordosis. 3. Slight grade 1 anterolisthesis at C2-C3. 4. Cervical spondylosis as described. 5. 2 cm posterior paraspinal mass on the right at the C3-C4 level, as described and present on prior examinations dating back to at least 2011. Although indeterminate in etiology, this is almost certainly benign and could reflect a vascular malformation. Electronically Signed   By: Kellie Simmering D.O.   On: 08/29/2022 15:07   CT Cervical Spine Wo Contrast  Result Date: 08/29/2022 CLINICAL DATA:  Provided history: Head trauma, moderate/severe. Fall. EXAM: CT HEAD WITHOUT CONTRAST CT MAXILLOFACIAL WITHOUT CONTRAST CT CERVICAL SPINE WITHOUT CONTRAST TECHNIQUE: Multidetector CT imaging of the head, cervical spine, and maxillofacial structures were performed using the standard protocol without intravenous contrast. Multiplanar CT image reconstructions of the cervical spine and maxillofacial structures were also generated. RADIATION DOSE REDUCTION: This exam was performed according to the departmental dose-optimization program which includes automated exposure control, adjustment of the mA and/or kV according to patient size and/or use of iterative reconstruction technique. COMPARISON:  Prior head CT examinations 02/05/2013 and earlier. Prior cervical spine CT examinations 12/16/2016 and earlier. Report from maxillofacial CT 08/22/2012 (images unavailable). FINDINGS: CT HEAD FINDINGS Brain: Cerebral atrophy. Patchy and ill-defined hypoattenuation within the cerebral white matter, nonspecific but compatible with mild chronic small vessel ischemic disease. There is no acute intracranial hemorrhage. No demarcated cortical infarct. No extra-axial fluid collection. No evidence of an intracranial mass. No midline shift. Vascular: No hyperdense vessel. Atherosclerotic calcifications. Skull: No fracture or aggressive osseous lesion. Other: Left anterior scalp/forehead hematoma. CT MAXILLOFACIAL FINDINGS Osseous: Subtle fracture deformity  of the right nasal bone, new from the prior head CT of 08/22/2012 but otherwise age indeterminate. No acute maxillofacial fracture identified elsewhere. Orbits: No acute orbital finding. Sinuses: Mucosal thickening, and tiny mucous retention cyst, within the right sphenoid sinus. Mild Soft tissues: Nasal soft tissue swelling. Left anterior scalp/forehead hematoma. CT CERVICAL SPINE FINDINGS Alignment: Straightening of the expected cervical lordosis. Slight C2-C3 grade 1 anterolisthesis. Skull base and vertebrae: The  basion-dental and atlanto-dental intervals are maintained.No evidence of acute fracture to the cervical spine. Soft tissues and spinal canal: No prevertebral fluid or swelling. No visible canal hematoma. Disc levels: Cervical spondylosis with multilevel disc space narrowing, disc bulges/central disc protrusions, posterior disc osteophyte complexes, uncovertebral hypertrophy and facet arthrosis. Disc space narrowing is advanced at C3-C4, C4-C5, C5-C6, C6-C7 and C7-T1. Multilevel spinal canal stenosis. Most notably at C3-C4, a posterior disc osteophyte complex eccentric to the left contributes to at least moderate spinal canal stenosis. No high-grade bony neural foraminal narrowing. Upper chest: No consolidation within the imaged lung apices. No visible pneumothorax. Other: 2 cm posterior paraspinal mass on the right at the C3-C4 level with internal foci of calcification. This has been present on prior examinations dating back to at least 2011. This is almost certainly benign and may reflect a vascular malformation. IMPRESSION: CT head: 1.  No evidence of an acute intracranial abnormality. 2. Left anterior scalp/forehead hematoma. 3. Mild chronic small vessel ischemic changes within the cerebral white matter. 4. Cerebral atrophy. CT maxillofacial: 1. Subtle fracture deformity of the right nasal bone, new from the prior head CT of 08/22/2012 but otherwise age indeterminate. 2. Nasal soft tissue swelling. 3.  Left anterior scalp/forehead hematoma. CT cervical spine: 1. No evidence of acute fracture to the cervical spine. 2. Nonspecific straightening of the expected cervical lordosis. 3. Slight grade 1 anterolisthesis at C2-C3. 4. Cervical spondylosis as described. 5. 2 cm posterior paraspinal mass on the right at the C3-C4 level, as described and present on prior examinations dating back to at least 2011. Although indeterminate in etiology, this is almost certainly benign and could reflect a vascular malformation. Electronically Signed   By: Kellie Simmering D.O.   On: 08/29/2022 15:07   CT MAXILLOFACIAL WO CONTRAST  Result Date: 08/29/2022 CLINICAL DATA:  Provided history: Head trauma, moderate/severe. Fall. EXAM: CT HEAD WITHOUT CONTRAST CT MAXILLOFACIAL WITHOUT CONTRAST CT CERVICAL SPINE WITHOUT CONTRAST TECHNIQUE: Multidetector CT imaging of the head, cervical spine, and maxillofacial structures were performed using the standard protocol without intravenous contrast. Multiplanar CT image reconstructions of the cervical spine and maxillofacial structures were also generated. RADIATION DOSE REDUCTION: This exam was performed according to the departmental dose-optimization program which includes automated exposure control, adjustment of the mA and/or kV according to patient size and/or use of iterative reconstruction technique. COMPARISON:  Prior head CT examinations 02/05/2013 and earlier. Prior cervical spine CT examinations 12/16/2016 and earlier. Report from maxillofacial CT 08/22/2012 (images unavailable). FINDINGS: CT HEAD FINDINGS Brain: Cerebral atrophy. Patchy and ill-defined hypoattenuation within the cerebral white matter, nonspecific but compatible with mild chronic small vessel ischemic disease. There is no acute intracranial hemorrhage. No demarcated cortical infarct. No extra-axial fluid collection. No evidence of an intracranial mass. No midline shift. Vascular: No hyperdense vessel. Atherosclerotic  calcifications. Skull: No fracture or aggressive osseous lesion. Other: Left anterior scalp/forehead hematoma. CT MAXILLOFACIAL FINDINGS Osseous: Subtle fracture deformity of the right nasal bone, new from the prior head CT of 08/22/2012 but otherwise age indeterminate. No acute maxillofacial fracture identified elsewhere. Orbits: No acute orbital finding. Sinuses: Mucosal thickening, and tiny mucous retention cyst, within the right sphenoid sinus. Mild Soft tissues: Nasal soft tissue swelling. Left anterior scalp/forehead hematoma. CT CERVICAL SPINE FINDINGS Alignment: Straightening of the expected cervical lordosis. Slight C2-C3 grade 1 anterolisthesis. Skull base and vertebrae: The basion-dental and atlanto-dental intervals are maintained.No evidence of acute fracture to the cervical spine. Soft tissues and spinal canal: No prevertebral fluid or swelling. No  visible canal hematoma. Disc levels: Cervical spondylosis with multilevel disc space narrowing, disc bulges/central disc protrusions, posterior disc osteophyte complexes, uncovertebral hypertrophy and facet arthrosis. Disc space narrowing is advanced at C3-C4, C4-C5, C5-C6, C6-C7 and C7-T1. Multilevel spinal canal stenosis. Most notably at C3-C4, a posterior disc osteophyte complex eccentric to the left contributes to at least moderate spinal canal stenosis. No high-grade bony neural foraminal narrowing. Upper chest: No consolidation within the imaged lung apices. No visible pneumothorax. Other: 2 cm posterior paraspinal mass on the right at the C3-C4 level with internal foci of calcification. This has been present on prior examinations dating back to at least 2011. This is almost certainly benign and may reflect a vascular malformation. IMPRESSION: CT head: 1.  No evidence of an acute intracranial abnormality. 2. Left anterior scalp/forehead hematoma. 3. Mild chronic small vessel ischemic changes within the cerebral white matter. 4. Cerebral atrophy. CT  maxillofacial: 1. Subtle fracture deformity of the right nasal bone, new from the prior head CT of 08/22/2012 but otherwise age indeterminate. 2. Nasal soft tissue swelling. 3. Left anterior scalp/forehead hematoma. CT cervical spine: 1. No evidence of acute fracture to the cervical spine. 2. Nonspecific straightening of the expected cervical lordosis. 3. Slight grade 1 anterolisthesis at C2-C3. 4. Cervical spondylosis as described. 5. 2 cm posterior paraspinal mass on the right at the C3-C4 level, as described and present on prior examinations dating back to at least 2011. Although indeterminate in etiology, this is almost certainly benign and could reflect a vascular malformation. Electronically Signed   By: Kellie Simmering D.O.   On: 08/29/2022 15:07     EKG EKG Interpretation  Date/Time:  Monday August 29 2022 15:10:06 EDT Ventricular Rate:  54 PR Interval:  158 QRS Duration: 104 QT Interval:  422 QTC Calculation: 400 R Axis:   24 Text Interpretation: Sinus rhythm Borderline repolarization abnormality Non-specific ST-t changes Confirmed by Lajean Saver [phone]) on 08/29/2022 3:32:14 PM  Radiology CT Head Wo Contrast  Result Date: 08/29/2022 CLINICAL DATA:  Provided history: Head trauma, moderate/severe. Fall. EXAM: CT HEAD WITHOUT CONTRAST CT MAXILLOFACIAL WITHOUT CONTRAST CT CERVICAL SPINE WITHOUT CONTRAST TECHNIQUE: Multidetector CT imaging of the head, cervical spine, and maxillofacial structures were performed using the standard protocol without intravenous contrast. Multiplanar CT image reconstructions of the cervical spine and maxillofacial structures were also generated. RADIATION DOSE REDUCTION: This exam was performed according to the departmental dose-optimization program which includes automated exposure control, adjustment of the mA and/or kV according to patient size and/or use of iterative reconstruction technique. COMPARISON:  Prior head CT examinations 02/05/2013 and earlier. Prior  cervical spine CT examinations 12/16/2016 and earlier. Report from maxillofacial CT 08/22/2012 (images unavailable). FINDINGS: CT HEAD FINDINGS Brain: Cerebral atrophy. Patchy and ill-defined hypoattenuation within the cerebral white matter, nonspecific but compatible with mild chronic small vessel ischemic disease. There is no acute intracranial hemorrhage. No demarcated cortical infarct. No extra-axial fluid collection. No evidence of an intracranial mass. No midline shift. Vascular: No hyperdense vessel. Atherosclerotic calcifications. Skull: No fracture or aggressive osseous lesion. Other: Left anterior scalp/forehead hematoma. CT MAXILLOFACIAL FINDINGS Osseous: Subtle fracture deformity of the right nasal bone, new from the prior head CT of 08/22/2012 but otherwise age indeterminate. No acute maxillofacial fracture identified elsewhere. Orbits: No acute orbital finding. Sinuses: Mucosal thickening, and tiny mucous retention cyst, within the right sphenoid sinus. Mild Soft tissues: Nasal soft tissue swelling. Left anterior scalp/forehead hematoma. CT CERVICAL SPINE FINDINGS Alignment: Straightening of the expected cervical lordosis. Slight C2-C3  grade 1 anterolisthesis. Skull base and vertebrae: The basion-dental and atlanto-dental intervals are maintained.No evidence of acute fracture to the cervical spine. Soft tissues and spinal canal: No prevertebral fluid or swelling. No visible canal hematoma. Disc levels: Cervical spondylosis with multilevel disc space narrowing, disc bulges/central disc protrusions, posterior disc osteophyte complexes, uncovertebral hypertrophy and facet arthrosis. Disc space narrowing is advanced at C3-C4, C4-C5, C5-C6, C6-C7 and C7-T1. Multilevel spinal canal stenosis. Most notably at C3-C4, a posterior disc osteophyte complex eccentric to the left contributes to at least moderate spinal canal stenosis. No high-grade bony neural foraminal narrowing. Upper chest: No consolidation within  the imaged lung apices. No visible pneumothorax. Other: 2 cm posterior paraspinal mass on the right at the C3-C4 level with internal foci of calcification. This has been present on prior examinations dating back to at least 2011. This is almost certainly benign and may reflect a vascular malformation. IMPRESSION: CT head: 1.  No evidence of an acute intracranial abnormality. 2. Left anterior scalp/forehead hematoma. 3. Mild chronic small vessel ischemic changes within the cerebral white matter. 4. Cerebral atrophy. CT maxillofacial: 1. Subtle fracture deformity of the right nasal bone, new from the prior head CT of 08/22/2012 but otherwise age indeterminate. 2. Nasal soft tissue swelling. 3. Left anterior scalp/forehead hematoma. CT cervical spine: 1. No evidence of acute fracture to the cervical spine. 2. Nonspecific straightening of the expected cervical lordosis. 3. Slight grade 1 anterolisthesis at C2-C3. 4. Cervical spondylosis as described. 5. 2 cm posterior paraspinal mass on the right at the C3-C4 level, as described and present on prior examinations dating back to at least 2011. Although indeterminate in etiology, this is almost certainly benign and could reflect a vascular malformation. Electronically Signed   By: Kellie Simmering D.O.   On: 08/29/2022 15:07   CT Cervical Spine Wo Contrast  Result Date: 08/29/2022 CLINICAL DATA:  Provided history: Head trauma, moderate/severe. Fall. EXAM: CT HEAD WITHOUT CONTRAST CT MAXILLOFACIAL WITHOUT CONTRAST CT CERVICAL SPINE WITHOUT CONTRAST TECHNIQUE: Multidetector CT imaging of the head, cervical spine, and maxillofacial structures were performed using the standard protocol without intravenous contrast. Multiplanar CT image reconstructions of the cervical spine and maxillofacial structures were also generated. RADIATION DOSE REDUCTION: This exam was performed according to the departmental dose-optimization program which includes automated exposure control,  adjustment of the mA and/or kV according to patient size and/or use of iterative reconstruction technique. COMPARISON:  Prior head CT examinations 02/05/2013 and earlier. Prior cervical spine CT examinations 12/16/2016 and earlier. Report from maxillofacial CT 08/22/2012 (images unavailable). FINDINGS: CT HEAD FINDINGS Brain: Cerebral atrophy. Patchy and ill-defined hypoattenuation within the cerebral white matter, nonspecific but compatible with mild chronic small vessel ischemic disease. There is no acute intracranial hemorrhage. No demarcated cortical infarct. No extra-axial fluid collection. No evidence of an intracranial mass. No midline shift. Vascular: No hyperdense vessel. Atherosclerotic calcifications. Skull: No fracture or aggressive osseous lesion. Other: Left anterior scalp/forehead hematoma. CT MAXILLOFACIAL FINDINGS Osseous: Subtle fracture deformity of the right nasal bone, new from the prior head CT of 08/22/2012 but otherwise age indeterminate. No acute maxillofacial fracture identified elsewhere. Orbits: No acute orbital finding. Sinuses: Mucosal thickening, and tiny mucous retention cyst, within the right sphenoid sinus. Mild Soft tissues: Nasal soft tissue swelling. Left anterior scalp/forehead hematoma. CT CERVICAL SPINE FINDINGS Alignment: Straightening of the expected cervical lordosis. Slight C2-C3 grade 1 anterolisthesis. Skull base and vertebrae: The basion-dental and atlanto-dental intervals are maintained.No evidence of acute fracture to the cervical spine. Soft tissues  and spinal canal: No prevertebral fluid or swelling. No visible canal hematoma. Disc levels: Cervical spondylosis with multilevel disc space narrowing, disc bulges/central disc protrusions, posterior disc osteophyte complexes, uncovertebral hypertrophy and facet arthrosis. Disc space narrowing is advanced at C3-C4, C4-C5, C5-C6, C6-C7 and C7-T1. Multilevel spinal canal stenosis. Most notably at C3-C4, a posterior disc  osteophyte complex eccentric to the left contributes to at least moderate spinal canal stenosis. No high-grade bony neural foraminal narrowing. Upper chest: No consolidation within the imaged lung apices. No visible pneumothorax. Other: 2 cm posterior paraspinal mass on the right at the C3-C4 level with internal foci of calcification. This has been present on prior examinations dating back to at least 2011. This is almost certainly benign and may reflect a vascular malformation. IMPRESSION: CT head: 1.  No evidence of an acute intracranial abnormality. 2. Left anterior scalp/forehead hematoma. 3. Mild chronic small vessel ischemic changes within the cerebral white matter. 4. Cerebral atrophy. CT maxillofacial: 1. Subtle fracture deformity of the right nasal bone, new from the prior head CT of 08/22/2012 but otherwise age indeterminate. 2. Nasal soft tissue swelling. 3. Left anterior scalp/forehead hematoma. CT cervical spine: 1. No evidence of acute fracture to the cervical spine. 2. Nonspecific straightening of the expected cervical lordosis. 3. Slight grade 1 anterolisthesis at C2-C3. 4. Cervical spondylosis as described. 5. 2 cm posterior paraspinal mass on the right at the C3-C4 level, as described and present on prior examinations dating back to at least 2011. Although indeterminate in etiology, this is almost certainly benign and could reflect a vascular malformation. Electronically Signed   By: Kellie Simmering D.O.   On: 08/29/2022 15:07   CT MAXILLOFACIAL WO CONTRAST  Result Date: 08/29/2022 CLINICAL DATA:  Provided history: Head trauma, moderate/severe. Fall. EXAM: CT HEAD WITHOUT CONTRAST CT MAXILLOFACIAL WITHOUT CONTRAST CT CERVICAL SPINE WITHOUT CONTRAST TECHNIQUE: Multidetector CT imaging of the head, cervical spine, and maxillofacial structures were performed using the standard protocol without intravenous contrast. Multiplanar CT image reconstructions of the cervical spine and maxillofacial structures  were also generated. RADIATION DOSE REDUCTION: This exam was performed according to the departmental dose-optimization program which includes automated exposure control, adjustment of the mA and/or kV according to patient size and/or use of iterative reconstruction technique. COMPARISON:  Prior head CT examinations 02/05/2013 and earlier. Prior cervical spine CT examinations 12/16/2016 and earlier. Report from maxillofacial CT 08/22/2012 (images unavailable). FINDINGS: CT HEAD FINDINGS Brain: Cerebral atrophy. Patchy and ill-defined hypoattenuation within the cerebral white matter, nonspecific but compatible with mild chronic small vessel ischemic disease. There is no acute intracranial hemorrhage. No demarcated cortical infarct. No extra-axial fluid collection. No evidence of an intracranial mass. No midline shift. Vascular: No hyperdense vessel. Atherosclerotic calcifications. Skull: No fracture or aggressive osseous lesion. Other: Left anterior scalp/forehead hematoma. CT MAXILLOFACIAL FINDINGS Osseous: Subtle fracture deformity of the right nasal bone, new from the prior head CT of 08/22/2012 but otherwise age indeterminate. No acute maxillofacial fracture identified elsewhere. Orbits: No acute orbital finding. Sinuses: Mucosal thickening, and tiny mucous retention cyst, within the right sphenoid sinus. Mild Soft tissues: Nasal soft tissue swelling. Left anterior scalp/forehead hematoma. CT CERVICAL SPINE FINDINGS Alignment: Straightening of the expected cervical lordosis. Slight C2-C3 grade 1 anterolisthesis. Skull base and vertebrae: The basion-dental and atlanto-dental intervals are maintained.No evidence of acute fracture to the cervical spine. Soft tissues and spinal canal: No prevertebral fluid or swelling. No visible canal hematoma. Disc levels: Cervical spondylosis with multilevel disc space narrowing, disc bulges/central disc protrusions,  posterior disc osteophyte complexes, uncovertebral hypertrophy  and facet arthrosis. Disc space narrowing is advanced at C3-C4, C4-C5, C5-C6, C6-C7 and C7-T1. Multilevel spinal canal stenosis. Most notably at C3-C4, a posterior disc osteophyte complex eccentric to the left contributes to at least moderate spinal canal stenosis. No high-grade bony neural foraminal narrowing. Upper chest: No consolidation within the imaged lung apices. No visible pneumothorax. Other: 2 cm posterior paraspinal mass on the right at the C3-C4 level with internal foci of calcification. This has been present on prior examinations dating back to at least 2011. This is almost certainly benign and may reflect a vascular malformation. IMPRESSION: CT head: 1.  No evidence of an acute intracranial abnormality. 2. Left anterior scalp/forehead hematoma. 3. Mild chronic small vessel ischemic changes within the cerebral white matter. 4. Cerebral atrophy. CT maxillofacial: 1. Subtle fracture deformity of the right nasal bone, new from the prior head CT of 08/22/2012 but otherwise age indeterminate. 2. Nasal soft tissue swelling. 3. Left anterior scalp/forehead hematoma. CT cervical spine: 1. No evidence of acute fracture to the cervical spine. 2. Nonspecific straightening of the expected cervical lordosis. 3. Slight grade 1 anterolisthesis at C2-C3. 4. Cervical spondylosis as described. 5. 2 cm posterior paraspinal mass on the right at the C3-C4 level, as described and present on prior examinations dating back to at least 2011. Although indeterminate in etiology, this is almost certainly benign and could reflect a vascular malformation. Electronically Signed   By: Kellie Simmering D.O.   On: 08/29/2022 15:07    Procedures Procedures  {Document cardiac monitor, telemetry assessment procedure when appropriate:1}  Medications Ordered in ED Medications  acetaminophen (TYLENOL) tablet 1,000 mg (has no administration in time range)  sodium chloride 0.9 % bolus 500 mL (has no administration in time range)   potassium chloride SA (KLOR-CON M) CR tablet 40 mEq (has no administration in time range)    ED Course/ Medical Decision Making/ A&P   {   Click here for ABCD2, HEART and other calculatorsREFRESH Note before signing :1}                          Medical Decision Making Amount and/or Complexity of Data Reviewed Labs: ordered. Radiology: ordered. ECG/medicine tests: ordered.  Risk OTC drugs.   Iv ns. Continuous pulse ox and cardiac monitoring. Labs ordered/sent. Imaging ordered.   Differential diagnosis includes SDH, head injury, concussion, etc . Dispo decision including potential need for admission considered - will get labs and imaging and reassess.   Reviewed nursing notes and prior charts for additional history. External reports reviewed. Additional history from: EMS.   Cardiac monitor: sinus rhythm, rate 56.  Labs reviewed/interpreted by me - cr 1.85. ns bolus. In careeverywhere, cr 1.8 in 02/2022.   K mildly low, kcl po. Wbc and hgb normal.   CT reviewed/interpreted by me - no hem. Incidental findings in c spine ct shared  w  pt.  Po fluids, food, acetaminophen po. Ambulate in hall.   Pt currently appears stable for d/c.   Rec close pcp f/u.  Return precautions provided.      {Document critical care time when appropriate:1} {Document review of labs and clinical decision tools ie heart score, Chads2Vasc2 etc:1}  {Document your independent review of radiology images, and any outside records:1} {Document your discussion with family members, caretakers, and with consultants:1} {Document social determinants of health affecting pt's care:1} {Document your decision making why or why not admission, treatments were  needed:1} Final Clinical Impression(s) / ED Diagnoses Final diagnoses:  None    Rx / DC Orders ED Discharge Orders     None

## 2022-09-05 DIAGNOSIS — S022XXD Fracture of nasal bones, subsequent encounter for fracture with routine healing: Secondary | ICD-10-CM | POA: Diagnosis not present

## 2022-09-07 DIAGNOSIS — S022XXD Fracture of nasal bones, subsequent encounter for fracture with routine healing: Secondary | ICD-10-CM | POA: Diagnosis not present

## 2022-09-07 DIAGNOSIS — R42 Dizziness and giddiness: Secondary | ICD-10-CM | POA: Diagnosis not present

## 2022-09-07 DIAGNOSIS — Z6841 Body Mass Index (BMI) 40.0 and over, adult: Secondary | ICD-10-CM | POA: Diagnosis not present

## 2022-09-07 DIAGNOSIS — S0083XD Contusion of other part of head, subsequent encounter: Secondary | ICD-10-CM | POA: Diagnosis not present

## 2022-09-07 DIAGNOSIS — Z09 Encounter for follow-up examination after completed treatment for conditions other than malignant neoplasm: Secondary | ICD-10-CM | POA: Diagnosis not present

## 2022-09-14 ENCOUNTER — Encounter: Payer: Self-pay | Admitting: Rehabilitative and Restorative Service Providers"

## 2022-09-14 ENCOUNTER — Other Ambulatory Visit: Payer: Self-pay

## 2022-09-14 ENCOUNTER — Ambulatory Visit: Payer: Medicare Other | Attending: Family Medicine | Admitting: Rehabilitative and Restorative Service Providers"

## 2022-09-14 DIAGNOSIS — R42 Dizziness and giddiness: Secondary | ICD-10-CM | POA: Diagnosis not present

## 2022-09-14 DIAGNOSIS — R2689 Other abnormalities of gait and mobility: Secondary | ICD-10-CM | POA: Insufficient documentation

## 2022-09-14 DIAGNOSIS — H8111 Benign paroxysmal vertigo, right ear: Secondary | ICD-10-CM | POA: Diagnosis not present

## 2022-09-14 NOTE — Therapy (Signed)
OUTPATIENT PHYSICAL THERAPY VESTIBULAR EVALUATION   Patient Name: Abigail [name] MRN: [id number] DOB:1941-02-28, 82 y.o., female Today's Date: 09/14/2022  END OF SESSION:  PT End of Session - 09/14/22 1029     Visit Number 1    Number of Visits 16    Date for PT Re-Evaluation 11/13/22    Authorization Type Medicare    PT Start Time 1025    PT Stop Time 1105    PT Time Calculation (min) 40 min    Activity Tolerance Patient tolerated treatment well    Behavior During Therapy WFL for tasks assessed/performed             Past Medical History:  Diagnosis Date   Arthritis    shoulder, knee,    Boils    near vaginal area   Chronic kidney disease    stage 3; low kidney function; BUN was high acc to pt   Constipation due to pain medication    Depression    Diabetes mellitus    Type 2   Family hx of colon cancer    Fibromyalgia    Frequency of urination    Frequent urination at night    GERD (gastroesophageal reflux disease)    pt denies   Heart murmur    History of bladder infections    Hyperlipidemia    Hypertension    Hypothyroidism    Itchy eyes    Obesity    Obesity    PONV (postoperative nausea and vomiting)    with stapedectomy and    Torn rotator cuff left   current   Past Surgical History:  Procedure Laterality Date   BACK SURGERY     lumbar fusion   BLADDER SUSPENSION N/A 02/26/2020   Procedure: TRANSVAGINAL TAPE (TVT) PROCEDURE;  Surgeon: Carrington Clamp, MD;  Location: [facility];  Service: Gynecology;  Laterality: N/A;   CARPAL TUNNEL RELEASE Right 06/29/2017   Procedure: RIGHT CARPAL TUNNEL RELEASE;  Surgeon: Cindee Salt, MD;  Location: [facility];  Service: Orthopedics;  Laterality: Right;   CHOLECYSTECTOMY  1971   COLONOSCOPY     CYSTOCELE REPAIR N/A 02/26/2020   Procedure: ANTERIOR REPAIR (CYSTOCELE);  Surgeon: Carrington Clamp, MD;  Location: [facility];  Service: Gynecology;  Laterality: N/A;    CYSTOSCOPY N/A 02/26/2020   Procedure: CYSTOSCOPY;  Surgeon: Carrington Clamp, MD;  Location: [facility];  Service: Gynecology;  Laterality: N/A;   DILATION AND CURETTAGE OF UTERUS     EYE SURGERY Bilateral 2020   MAXIMUM ACCESS (MAS)POSTERIOR LUMBAR INTERBODY FUSION (PLIF) 1 LEVEL N/A 11/07/2014   Procedure: Lumbar Four-Five Maximum access posterior lumbar interbody fusion;  Surgeon: Maeola Harman, MD;  Location: [facility];  Service: Neurosurgery;  Laterality: N/A;  L4-5 Maximum access posterior lumbar interbody fusion   SHOULDER SURGERY     bilateral rotator cuff repairs   STAPEDECTOMY Left    ear   TUBAL LIGATION  1971   VAGINAL HYSTERECTOMY N/A 02/26/2020   Procedure: HYSTERECTOMY VAGINAL;  Surgeon: Carrington Clamp, MD;  Location: [facility];  Service: Gynecology;  Laterality: N/A;   Patient Active Problem List   Diagnosis Date Noted   Postoperative state 02/26/2020   Furuncle of vulva 12/06/2016   Bilateral carpal tunnel syndrome 11/23/2016   Neck pain 11/23/2016   Primary osteoarthritis of both first carpometacarpal joints 11/23/2016   Diabetic nephropathy 08/18/2016   Right knee pain 01/03/2016   Asymmetrical hearing loss of both  ears 02/27/2015   Pressure ulcer 11/10/2014   Spondylolisthesis of lumbar region 11/07/2014   CKD (chronic kidney disease), stage III 07/24/2013   Chest pain 07/23/2013   Chronic pain 07/23/2013   Fibromyalgia 07/23/2013   Leukocytosis 02/07/2013   Weakness generalized 02/07/2013   Acute encephalopathy 02/05/2013   Anemia 02/05/2013   Acute renal failure 02/05/2013   Dehydration 09/12/2012   Diabetes mellitus 09/12/2012   Hypertension 09/12/2012   Hypothyroidism 09/12/2012   Hyperlipidemia 09/12/2012    PCP: Daisy Floro, MD REFERRING PROVIDER: Daisy Floro, MD REFERRING DIAG:  Diagnosis  R42 (ICD-10-CM) - Dizziness and giddiness   THERAPY DIAG:  BPPV (benign paroxysmal positional  vertigo), right  Dizziness and giddiness  Other abnormalities of gait and mobility  ONSET DATE: 08/29/22 Rationale for Evaluation and Treatment: Rehabilitation  SUBJECTIVE:  SUBJECTIVE STATEMENT: The patient reports vertigo began after a sudden fall on 08/29/22 in which she hit her head falling while stepping up a sidewalk. Vertigo began 1 week after the fall and is imbalance while walking and "everything spinning" and a sensation she will fall.  She denies vertigo with bed mobility, but notes she doesn't roll b/c of her back and her shoulder.  She also fractured her nasal bone during the fall. The patient reports her husband of 61 years passed away last year.  Pt accompanied by:  friend drove her to therapy PERTINENT HISTORY: Chronic pain (knees, back and shoulders), HTN. PAIN:  Are you having pain? Yes: NPRS scale: 2/10 Pain location: chronic pain back, shoulder, knees Pain description: chronic *PT will monitor, no goals to follow Aggravating factors: will monitor Relieving factors: will monitor PRECAUTIONS: Fall WEIGHT BEARING RESTRICTIONS: No FALLS: Has patient fallen in last 6 months? Yes. Number of falls 1 LIVING ENVIRONMENT: Lives with: lives alone Lives in: House/apartment Stairs: No Has following equipment at home: Single point cane PLOF: Independent PATIENT GOALS: feel more steady with walking  OBJECTIVE:  COGNITION: Overall cognitive status: Within functional limits for tasks assessed   SENSATION: WFL  Cervical ROM:  Limited mobility throughout/ modified testing positions  LOWER EXTREMITY MMT:   MMT Right eval Left eval  Hip flexion    Hip abduction    Hip adduction    Hip internal rotation    Hip external rotation    Knee flexion    Knee extension    Ankle dorsiflexion    Ankle plantarflexion    Ankle inversion    Ankle eversion    (Blank rows = not tested)  BED MOBILITY:  Limited in all mobility by chronic pain/ PT assisted  GAIT: Gait pattern:   slowed gait with SPC mod indep with dec'd sequencing of cane Distance walked: 100 ft  VESTIBULAR ASSESSMENT:  GENERAL OBSERVATION: Walks slowly into the clinic with SPC mod indep.   SYMPTOM BEHAVIOR:  Subjective history: spinning and imbalance  Non-Vestibular symptoms:  none  Type of dizziness: Spinning/Vertigo and Unsteady with head/body turns  Frequency: daily   Duration: seconds to minutes  Aggravating factors:  worse when walking and on feet moving  Relieving factors: head stationary  Progression of symptoms: worse  OCULOMOTOR EXAM:  Ocular Alignment: abnormal *swelling and bruising around eyes and nose; note L eye elevated (hypertropia)  Ocular ROM: No Limitations  Spontaneous Nystagmus: absent  Gaze-Induced Nystagmus: absent  Smooth Pursuits: intact  Saccades: intact  VESTIBULAR - OCULAR REFLEX:   Slow VOR: Normal and Comment: slow pace, limited ROM  Head-Impulse Test:  deferred due to guarding  POSITIONAL TESTING: Right Dix-Hallpike: unable to tolerate due to mobility issues Left Dix-Hallpike: unable to tolerate due to mobility issues Right Roll Test: geotropic nystagmus Left Roll Test: geotropic nystagmus Right Sidelying: reports mild sensation of dizziness moving into L sidelying  Left Sidelying: Note a horizontal beating nystagmus (think horizontal geotropic nystagmus) in L sidelying  *walls move with return to sitting  MOTION SENSITIVITY:  Motion Sensitivity Quotient Intensity: 0 = none, 1 = Lightheaded, 2 = Mild, 3 = Moderate, 4 = Severe, 5 = Vomiting  Intensity  1. Sitting to supine   2. Supine to L side   3. Supine to R side   4. Supine to sitting   5. L Hallpike-Dix   6. Up from L    7. R Hallpike-Dix   8. Up from R    9. Sitting, head tipped to L knee   10. Head up from L knee   11. Sitting, head tipped to R knee   12. Head up from R knee   13. Sitting head turns x5   14.Sitting head nods x5   15. In stance, 180 turn to L    16. In stance,  180 turn to R     OTHOSTATICS: not done  FUNCTIONAL GAIT: 5 times sit to stand: tba 10 meter walk test: tba Berg Balance Scale: tba  [facility] Adult PT Treatment:                                                DATE: 09/14/22  Self Care: Discussed home safety with vertigo and moving slowly Discussed proper sequencing with SPC NMR: Canalith Repositioning:  BBQ Roll Right: Number of Reps: 2, Response to Treatment: comment: remained at 2nd assessment of roll test, and Comment: Modified as patient cannot roll to prone. Had patient move to sidelying with nose pointing to the mat prior to sitting. PT providing mod A to move to sitting.  PATIENT EDUCATION: Education details: home safety, nature of condition Person educated: Patient Education method: Explanation Education comprehension: verbalized understanding  HOME EXERCISE PROGRAM: to be prescribed  GOALS: Goals reviewed with patient? Yes  SHORT TERM GOALS: Target date: 10/12/22  The patient will be indep with HEP. Baseline: Did not initiate due to BPPV Goal status: INITIAL  2.  The patient will have negative R horizontal roll test. Baseline:  + horizontal roll Goal status: INITIAL  3.  The patient will tolerate functional testing (5 time sit to stand, Berg, gait speed) and goals to follow for LTG, as appropriate. Baseline:  PT did not have time due to treating BPPV Goal status: INITIAL  LONG TERM GOALS: Target date: 11/09/22  The patient will be indep with HEP Baseline: to initiate after treating BPPV Goal status: INITIAL  2.  The patient will improve 5 points on Berg balance test. Baseline: To assess for STGs. Goal status: INITIAL  3.   The patient will improve walking speed by 0.5 ft/sec. Baseline:  To assess for STGs. Goal status: INITIAL  4.  The patient will improve 5 time sit to stand by 5 seconds. Baseline:  to assess for STGs. Goal status: INITIAL  ASSESSMENT: CLINICAL IMPRESSION: Patient is a 82 y.o. female  who was seen today for physical therapy evaluation and treatment for vertigo s/p a fall on 08/29/22. She presents with positive positional testing for R  horizontal canalithiasis per geotropic nystagmus lasting < 1 minute. Vertigo is leading to imbalance with functional mobility and further increasing risk for falls. PT to address deficits to promote improved mobility.  OBJECTIVE IMPAIRMENTS: Abnormal gait, decreased activity tolerance, decreased balance, decreased mobility, difficulty walking, decreased strength, dizziness, and pain.   ACTIVITY LIMITATIONS: sitting, standing, and locomotion level  PARTICIPATION LIMITATIONS: driving and community activity  PERSONAL FACTORS: Age and 1-2 comorbidities: falls, h/o HTN, chronic pain  are also affecting patient's functional outcome.   REHAB POTENTIAL: Good  CLINICAL DECISION MAKING: Stable/uncomplicated  EVALUATION COMPLEXITY: Low  PLAN:  PT FREQUENCY: 1-2x/week  PT DURATION: 8 weeks  PLANNED INTERVENTIONS: Therapeutic exercises, Therapeutic activity, Neuromuscular re-education, Balance training, Gait training, Patient/Family education, Self Care, Joint mobilization, Vestibular training, Canalith repositioning, and Manual therapy  PLAN FOR NEXT SESSION: recheck horizontal BPPV, treat as indicated. Begin LE strengthening and core stability to improve functional mobility. Assess Berg, gait speed and 5tsts.   Lamiracle Chaidez, PT 09/14/2022, 2:17 PM

## 2022-09-16 ENCOUNTER — Ambulatory Visit: Payer: Medicare Other | Admitting: Physical Therapy

## 2022-09-16 ENCOUNTER — Encounter: Payer: Self-pay | Admitting: Physical Therapy

## 2022-09-16 DIAGNOSIS — R2689 Other abnormalities of gait and mobility: Secondary | ICD-10-CM | POA: Diagnosis not present

## 2022-09-16 DIAGNOSIS — R42 Dizziness and giddiness: Secondary | ICD-10-CM | POA: Diagnosis not present

## 2022-09-16 DIAGNOSIS — H8111 Benign paroxysmal vertigo, right ear: Secondary | ICD-10-CM

## 2022-09-16 NOTE — Therapy (Signed)
OUTPATIENT PHYSICAL THERAPY VESTIBULAR EVALUATION   Patient Name: Abigail [name] MRN: [id number] DOB:June 27, 1940, 82 y.o., female Today's Date: 09/16/2022  END OF SESSION:  PT End of Session - 09/16/22 1100     Visit Number 2    Number of Visits 16    Date for PT Re-Evaluation 11/13/22    Authorization Type Medicare    PT Start Time 1100    PT Stop Time 1145    PT Time Calculation (min) 45 min    Activity Tolerance Patient tolerated treatment well    Behavior During Therapy WFL for tasks assessed/performed              Past Medical History:  Diagnosis Date   Arthritis    shoulder, knee,    Boils    near vaginal area   Chronic kidney disease    stage 3; low kidney function; BUN was high acc to pt   Constipation due to pain medication    Depression    Diabetes mellitus    Type 2   Family hx of colon cancer    Fibromyalgia    Frequency of urination    Frequent urination at night    GERD (gastroesophageal reflux disease)    pt denies   Heart murmur    History of bladder infections    Hyperlipidemia    Hypertension    Hypothyroidism    Itchy eyes    Obesity    Obesity    PONV (postoperative nausea and vomiting)    with stapedectomy and    Torn rotator cuff left   current   Past Surgical History:  Procedure Laterality Date   BACK SURGERY     lumbar fusion   BLADDER SUSPENSION N/A 02/26/2020   Procedure: TRANSVAGINAL TAPE (TVT) PROCEDURE;  Surgeon: Carrington Clamp, MD;  Location: [facility] [place];  Service: Gynecology;  Laterality: N/A;   CARPAL TUNNEL RELEASE Right 06/29/2017   Procedure: RIGHT CARPAL TUNNEL RELEASE;  Surgeon: Cindee Salt, MD;  Location: [facility];  Service: Orthopedics;  Laterality: Right;   CHOLECYSTECTOMY  1971   COLONOSCOPY     CYSTOCELE REPAIR N/A 02/26/2020   Procedure: ANTERIOR REPAIR (CYSTOCELE);  Surgeon: Carrington Clamp, MD;  Location: [facility];  Service: Gynecology;  Laterality:  N/A;   CYSTOSCOPY N/A 02/26/2020   Procedure: CYSTOSCOPY;  Surgeon: Carrington Clamp, MD;  Location: [facility];  Service: Gynecology;  Laterality: N/A;   DILATION AND CURETTAGE OF UTERUS     EYE SURGERY Bilateral 2020   MAXIMUM ACCESS (MAS)POSTERIOR LUMBAR INTERBODY FUSION (PLIF) 1 LEVEL N/A 11/07/2014   Procedure: Lumbar Four-Five Maximum access posterior lumbar interbody fusion;  Surgeon: Maeola Harman, MD;  Location: [facility];  Service: Neurosurgery;  Laterality: N/A;  L4-5 Maximum access posterior lumbar interbody fusion   SHOULDER SURGERY     bilateral rotator cuff repairs   STAPEDECTOMY Left    ear   TUBAL LIGATION  1971   VAGINAL HYSTERECTOMY N/A 02/26/2020   Procedure: HYSTERECTOMY VAGINAL;  Surgeon: Carrington Clamp, MD;  Location: [facility];  Service: Gynecology;  Laterality: N/A;   Patient Active Problem List   Diagnosis Date Noted   Postoperative state 02/26/2020   Furuncle of vulva 12/06/2016   Bilateral carpal tunnel syndrome 11/23/2016   Neck pain 11/23/2016   Primary osteoarthritis of both first carpometacarpal joints 11/23/2016   Diabetic nephropathy 08/18/2016   Right knee pain 01/03/2016   Asymmetrical hearing loss of  both ears 02/27/2015   Pressure ulcer 11/10/2014   Spondylolisthesis of lumbar region 11/07/2014   CKD (chronic kidney disease), stage III 07/24/2013   Chest pain 07/23/2013   Chronic pain 07/23/2013   Fibromyalgia 07/23/2013   Leukocytosis 02/07/2013   Weakness generalized 02/07/2013   Acute encephalopathy 02/05/2013   Anemia 02/05/2013   Acute renal failure 02/05/2013   Dehydration 09/12/2012   Diabetes mellitus 09/12/2012   Hypertension 09/12/2012   Hypothyroidism 09/12/2012   Hyperlipidemia 09/12/2012    PCP: Daisy Floro, MD REFERRING PROVIDER: Daisy Floro, MD REFERRING DIAG:  Diagnosis  R42 (ICD-10-CM) - Dizziness and giddiness   THERAPY DIAG:  BPPV (benign paroxysmal positional  vertigo), right  Dizziness and giddiness  Other abnormalities of gait and mobility  ONSET DATE: 08/29/22 Rationale for Evaluation and Treatment: Rehabilitation  SUBJECTIVE:  SUBJECTIVE STATEMENT: Pt reports dizziness isn't bad this morning. States she felt okay after last treatment session.  Pt accompanied by:  friend drove her to therapy  PERTINENT HISTORY: Chronic pain (knees, back and shoulders), HTN. From eval: The patient reports vertigo began after a sudden fall on 08/29/22 in which she hit her head falling while stepping up a sidewalk. Vertigo began 1 week after the fall and is imbalance while walking and "everything spinning" and a sensation she will fall.  She denies vertigo with bed mobility, but notes she doesn't roll b/c of her back and her shoulder.  She also fractured her nasal bone during the fall. The patient reports her husband of 61 years passed away last year.   PAIN:  Are you having pain? Yes: NPRS scale: 2/10 Pain location: chronic pain back, shoulder, knees Pain description: chronic *PT will monitor, no goals to follow Aggravating factors: will monitor Relieving factors: will monitor PRECAUTIONS: Fall WEIGHT BEARING RESTRICTIONS: No FALLS: Has patient fallen in last 6 months? Yes. Number of falls 1 LIVING ENVIRONMENT: Lives with: lives alone Lives in: House/apartment Stairs: No Has following equipment at home: Single point cane PLOF: Independent PATIENT GOALS: feel more steady with walking  OBJECTIVE: (Measures in this section from initial evaluation unless otherwise noted)  Cervical ROM:  Limited mobility throughout/ modified testing positions  LOWER EXTREMITY MMT:   MMT Right eval Left eval  Hip flexion    Hip abduction    Hip adduction    Hip internal rotation    Hip external rotation    Knee flexion    Knee extension    Ankle dorsiflexion    Ankle plantarflexion    Ankle inversion    Ankle eversion    (Blank rows = not tested)  BED  MOBILITY:  Limited in all mobility by chronic pain/ PT assisted  GAIT: Gait pattern:  slowed gait with SPC mod indep with dec'd sequencing of cane Distance walked: 100 ft  VESTIBULAR ASSESSMENT:  GENERAL OBSERVATION: Walks slowly into the clinic with SPC mod indep.   SYMPTOM BEHAVIOR:  Subjective history: spinning and imbalance  Non-Vestibular symptoms:  none  Type of dizziness: Spinning/Vertigo and Unsteady with head/body turns  Frequency: daily   Duration: seconds to minutes  Aggravating factors:  worse when walking and on feet moving  Relieving factors: head stationary  Progression of symptoms: worse  OCULOMOTOR EXAM:  Ocular Alignment: abnormal *swelling and bruising around eyes and nose; note L eye elevated (hypertropia)  Ocular ROM: No Limitations  Spontaneous Nystagmus: absent  Gaze-Induced Nystagmus: absent  Smooth Pursuits: intact  Saccades: intact  VESTIBULAR - OCULAR REFLEX:   Slow VOR:  Normal and Comment: slow pace, limited ROM  Head-Impulse Test:  deferred due to guarding    POSITIONAL TESTING: Right Dix-Hallpike: unable to tolerate due to mobility issues Left Dix-Hallpike: unable to tolerate due to mobility issues Right Roll Test: geotropic nystagmus Left Roll Test: geotropic nystagmus Right Sidelying: reports mild sensation of dizziness moving into L sidelying  Left Sidelying: Note a horizontal beating nystagmus (think horizontal geotropic nystagmus) in L sidelying  *walls move with return to sitting  09/16/22 Right sidelying: 1st rep ageotropic nystagmus ~1 min 30 sec; 2nd rep no nystagmus or symptoms Left sidelying:  1st rep slow geotropic nystagmus ~10 sec; 2nd rep no nystagmus but "felt like it's going to come on but didn't" Sidelying R Dix-hallpike: Slow R torsional nystagmus ~12 sec Sidelying L Dix-hallike: Slow torsional nystagmus but unable to visualize direction <3 sec   MOTION SENSITIVITY:  Motion Sensitivity Quotient Intensity: 0 = none, 1 =  Lightheaded, 2 = Mild, 3 = Moderate, 4 = Severe, 5 = Vomiting  Intensity  1. Sitting to supine   2. Supine to L side   3. Supine to R side   4. Supine to sitting   5. L Hallpike-Dix   6. Up from L    7. R Hallpike-Dix   8. Up from R    9. Sitting, head tipped to L knee   10. Head up from L knee   11. Sitting, head tipped to R knee   12. Head up from R knee   13. Sitting head turns x5   14.Sitting head nods x5   15. In stance, 180 turn to L    16. In stance, 180 turn to R     OTHOSTATICS: not done  FUNCTIONAL TESTS:  5 times sit to stand: 09/16/22 -- 31.05 sec 10 meter walk test: 09/16/22 -- 0.37 m/s  Sharlene Motts Balance Scale: tba  [facility] Adult PT Treatment:                                                DATE: 09/16/22 Canalith repositioning: Appiani/cassani R&L x 2 Semont maneuver R&L x1 Therapeutic Activity: See functional tests  [facility] Adult PT Treatment:                                                DATE: 09/14/22  Self Care: Discussed home safety with vertigo and moving slowly Discussed proper sequencing with SPC NMR: Canalith Repositioning:  BBQ Roll Right: Number of Reps: 2, Response to Treatment: comment: remained at 2nd assessment of roll test, and Comment: Modified as patient cannot roll to prone. Had patient move to sidelying with nose pointing to the mat prior to sitting. PT providing mod A to move to sitting.  PATIENT EDUCATION: Education details: home safety, nature of condition Person educated: Patient Education method: Explanation Education comprehension: verbalized understanding  HOME EXERCISE PROGRAM: to be prescribed  GOALS: Goals reviewed with patient? Yes  SHORT TERM GOALS: Target date: 10/12/22  The patient will be indep with HEP. Baseline: Did not initiate due to BPPV Goal status: INITIAL  2.  The patient will have negative R horizontal roll test. Baseline:  + horizontal roll Goal status: INITIAL  3.  The patient will  tolerate functional  testing (5 time sit to stand, Berg, gait speed) and goals to follow for LTG, as appropriate. Baseline:  PT did not have time due to treating BPPV Goal status: INITIAL  LONG TERM GOALS: Target date: 11/09/22  The patient will be indep with HEP Baseline: to initiate after treating BPPV Goal status: INITIAL  2.  The patient will improve 5 points on Berg balance test. Baseline: To assess for STGs. Goal status: INITIAL  3.   The patient will improve walking speed by 0.5 ft/sec. Baseline:  To assess for STGs. Goal status: INITIAL  4.  The patient will improve 5 time sit to stand by 5 seconds. Baseline:  to assess for STGs. Goal status: INITIAL  ASSESSMENT: CLINICAL IMPRESSION: Re-checked pt for BPPV. Much more obvious ageotropic nystagmus with right sidelying testing this session vs left sidelying demonstrating continued R horizontal canal involvement. Addressed with cassani/appiani with improved pt tolerance. Mild slow torsional nystagmus when testing R sidelying Dix-hallpike significant for likely R posterior canalithiasis. Checked pt's 5x STS and walking speed -- both indicate high fall risk.   From eval: Patient is a 82 y.o. female who was seen today for physical therapy evaluation and treatment for vertigo s/p a fall on 08/29/22. She presents with positive positional testing for R horizontal canalithiasis per geotropic nystagmus lasting < 1 minute. Vertigo is leading to imbalance with functional mobility and further increasing risk for falls. PT to address deficits to promote improved mobility.  OBJECTIVE IMPAIRMENTS: Abnormal gait, decreased activity tolerance, decreased balance, decreased mobility, difficulty walking, decreased strength, dizziness, and pain.   PLAN:  PT FREQUENCY: 1-2x/week  PT DURATION: 8 weeks  PLANNED INTERVENTIONS: Therapeutic exercises, Therapeutic activity, Neuromuscular re-education, Balance training, Gait training, Patient/Family education, Self Care, Joint  mobilization, Vestibular training, Canalith repositioning, and Manual therapy  PLAN FOR NEXT SESSION: recheck horizontal BPPV, treat as indicated. Begin LE strengthening and core stability to improve functional mobility. Assess Berg, gait speed and 5tsts.   Leyani Gargus April Ma L Rajah Lamba, PT 09/16/2022, 11:00 AM

## 2022-09-19 ENCOUNTER — Ambulatory Visit: Payer: Medicare Other | Admitting: Rehabilitative and Restorative Service Providers"

## 2022-09-19 ENCOUNTER — Encounter: Payer: Self-pay | Admitting: Rehabilitative and Restorative Service Providers"

## 2022-09-19 DIAGNOSIS — R42 Dizziness and giddiness: Secondary | ICD-10-CM | POA: Diagnosis not present

## 2022-09-19 DIAGNOSIS — H8111 Benign paroxysmal vertigo, right ear: Secondary | ICD-10-CM

## 2022-09-19 DIAGNOSIS — R2689 Other abnormalities of gait and mobility: Secondary | ICD-10-CM

## 2022-09-19 NOTE — Therapy (Signed)
OUTPATIENT PHYSICAL THERAPY VESTIBULAR EVALUATION   Patient Name: Abigail [name] MRN: [id number] DOB:05/05/1941, 82 y.o., female Today's Date: 09/19/2022  END OF SESSION:  PT End of Session - 09/19/22 1400     Visit Number 3    Number of Visits 16    Date for PT Re-Evaluation 11/13/22    Authorization Type Medicare    PT Start Time 1405    PT Stop Time 1445    PT Time Calculation (min) 40 min    Activity Tolerance Patient tolerated treatment well    Behavior During Therapy WFL for tasks assessed/performed            Past Medical History:  Diagnosis Date   Arthritis    shoulder, knee,    Boils    near vaginal area   Chronic kidney disease    stage 3; low kidney function; BUN was high acc to pt   Constipation due to pain medication    Depression    Diabetes mellitus    Type 2   Family hx of colon cancer    Fibromyalgia    Frequency of urination    Frequent urination at night    GERD (gastroesophageal reflux disease)    pt denies   Heart murmur    History of bladder infections    Hyperlipidemia    Hypertension    Hypothyroidism    Itchy eyes    Obesity    Obesity    PONV (postoperative nausea and vomiting)    with stapedectomy and    Torn rotator cuff left   current   Past Surgical History:  Procedure Laterality Date   BACK SURGERY     lumbar fusion   BLADDER SUSPENSION N/A 02/26/2020   Procedure: TRANSVAGINAL TAPE (TVT) PROCEDURE;  Surgeon: Carrington Clamp, MD;  Location: [facility];  Service: Gynecology;  Laterality: N/A;   CARPAL TUNNEL RELEASE Right 06/29/2017   Procedure: RIGHT CARPAL TUNNEL RELEASE;  Surgeon: Cindee Salt, MD;  Location: [facility];  Service: Orthopedics;  Laterality: Right;   CHOLECYSTECTOMY  1971   COLONOSCOPY     CYSTOCELE REPAIR N/A 02/26/2020   Procedure: ANTERIOR REPAIR (CYSTOCELE);  Surgeon: Carrington Clamp, MD;  Location: [facility];  Service: Gynecology;  Laterality: N/A;    CYSTOSCOPY N/A 02/26/2020   Procedure: CYSTOSCOPY;  Surgeon: Carrington Clamp, MD;  Location: [facility];  Service: Gynecology;  Laterality: N/A;   DILATION AND CURETTAGE OF UTERUS     EYE SURGERY Bilateral 2020   MAXIMUM ACCESS (MAS)POSTERIOR LUMBAR INTERBODY FUSION (PLIF) 1 LEVEL N/A 11/07/2014   Procedure: Lumbar Four-Five Maximum access posterior lumbar interbody fusion;  Surgeon: Maeola Harman, MD;  Location: [facility];  Service: Neurosurgery;  Laterality: N/A;  L4-5 Maximum access posterior lumbar interbody fusion   SHOULDER SURGERY     bilateral rotator cuff repairs   STAPEDECTOMY Left    ear   TUBAL LIGATION  1971   VAGINAL HYSTERECTOMY N/A 02/26/2020   Procedure: HYSTERECTOMY VAGINAL;  Surgeon: Carrington Clamp, MD;  [facility];  Service: Gynecology;  Laterality: N/A;   Patient Active Problem List   Diagnosis Date Noted   Postoperative state 02/26/2020   Furuncle of vulva 12/06/2016   Bilateral carpal tunnel syndrome 11/23/2016   Neck pain 11/23/2016   Primary osteoarthritis of both first carpometacarpal joints 11/23/2016   Diabetic nephropathy 08/18/2016   Right knee pain 01/03/2016   Asymmetrical hearing loss of both ears  02/27/2015   Pressure ulcer 11/10/2014   Spondylolisthesis of lumbar region 11/07/2014   CKD (chronic kidney disease), stage III 07/24/2013   Chest pain 07/23/2013   Chronic pain 07/23/2013   Fibromyalgia 07/23/2013   Leukocytosis 02/07/2013   Weakness generalized 02/07/2013   Acute encephalopathy 02/05/2013   Anemia 02/05/2013   Acute renal failure 02/05/2013   Dehydration 09/12/2012   Diabetes mellitus 09/12/2012   Hypertension 09/12/2012   Hypothyroidism 09/12/2012   Hyperlipidemia 09/12/2012    PCP: Daisy Floro, MD REFERRING PROVIDER: Daisy Floro, MD REFERRING DIAG:  Diagnosis  R42 (ICD-10-CM) - Dizziness and giddiness   THERAPY DIAG:  BPPV (benign paroxysmal positional  vertigo), right  Dizziness and giddiness  Other abnormalities of gait and mobility  ONSET DATE: 08/29/22 Rationale for Evaluation and Treatment: Rehabilitation  SUBJECTIVE:  SUBJECTIVE STATEMENT: Pt reports dizziness was still there on Saturday; improved since then. She doesn't roll in bed due to limited mobility.  Pt accompanied by:  friend drove her to therapy PERTINENT HISTORY: Chronic pain (knees, back and shoulders), HTN. From eval: The patient reports vertigo began after a sudden fall on 08/29/22 in which she hit her head falling while stepping up a sidewalk. Vertigo began 1 week after the fall and is imbalance while walking and "everything spinning" and a sensation she will fall.  She denies vertigo with bed mobility, but notes she doesn't roll b/c of her back and her shoulder.  She also fractured her nasal bone during the fall. The patient reports her husband of 61 years passed away last year.  PAIN:  Are you having pain? Yes: NPRS scale: 2/10 Pain location: chronic pain back, shoulder, knees Pain description: chronic *PT will monitor, no goals to follow Aggravating factors: will monitor Relieving factors: will monitor PRECAUTIONS: Fall WEIGHT BEARING RESTRICTIONS: No FALLS: Has patient fallen in last 6 months? Yes. Number of falls 1 LIVING ENVIRONMENT: Lives with: lives alone Lives in: House/apartment Stairs: No Has following equipment at home: Single point cane PLOF: Independent PATIENT GOALS: feel more steady with walking  OBJECTIVE: (Measures in this section from initial evaluation unless otherwise noted)  Cervical ROM:  Limited mobility throughout/ modified testing positions  LOWER EXTREMITY MMT:   MMT Right eval Left eval  Hip flexion    Hip abduction    Hip adduction    Hip internal rotation    Hip external rotation    Knee flexion    Knee extension    Ankle dorsiflexion    Ankle plantarflexion    Ankle inversion    Ankle eversion    (Blank rows = not  tested)  BED MOBILITY:  Limited in all mobility by chronic pain/ PT assisted  GAIT: Gait pattern:  slowed gait with SPC mod indep with dec'd sequencing of cane Distance walked: 100 ft  VESTIBULAR ASSESSMENT:  GENERAL OBSERVATION: Walks slowly into the clinic with SPC mod indep.   SYMPTOM BEHAVIOR:  Subjective history: spinning and imbalance  Non-Vestibular symptoms:  none  Type of dizziness: Spinning/Vertigo and Unsteady with head/body turns  Frequency: daily   Duration: seconds to minutes  Aggravating factors:  worse when walking and on feet moving  Relieving factors: head stationary  Progression of symptoms: worse  OCULOMOTOR EXAM:  Ocular Alignment: abnormal *swelling and bruising around eyes and nose; note L eye elevated (hypertropia)  Ocular ROM: No Limitations  Spontaneous Nystagmus: absent  Gaze-Induced Nystagmus: absent  Smooth Pursuits: intact  Saccades: intact  VESTIBULAR - OCULAR REFLEX:   Slow  VOR: Normal and Comment: slow pace, limited ROM  Head-Impulse Test:  deferred due to guarding    POSITIONAL TESTING: Right Dix-Hallpike: unable to tolerate due to mobility issues Left Dix-Hallpike: unable to tolerate due to mobility issues Right Roll Test: geotropic nystagmus Left Roll Test: geotropic nystagmus Right Sidelying: reports mild sensation of dizziness moving into L sidelying  Left Sidelying: Note a horizontal beating nystagmus (think horizontal geotropic nystagmus) in L sidelying  *walls move with return to sitting  09/16/22 Right sidelying: 1st rep ageotropic nystagmus ~1 min 30 sec; 2nd rep no nystagmus or symptoms Left sidelying:  1st rep slow geotropic nystagmus ~10 sec; 2nd rep no nystagmus but "felt like it's going to come on but didn't" Sidelying R Dix-hallpike: Slow R torsional nystagmus ~12 sec Sidelying L Dix-hallike: Slow torsional nystagmus but unable to visualize direction <3 sec  FUNCTIONAL TESTS:  5 times sit to stand: 09/16/22 -- 31.05  sec 10 meter walk test: 09/16/22 -- 0.37 m/s (1.21 ft/sec)  Sharlene Motts Balance Scale: tba   [facility] Adult PT Treatment:                                                DATE: 09/19/22 Therapeutic Exercise: Sit to stand for HEP Heel raises Neuromuscular re-ed: Horizontal roll test (-) to both sides Berg balance scale (interrupted mid Eva due to notification on smartphone her blood sugar was low) Rested and provided with granola bar and patient waited to allow blood sugar to come up Berg=34/56 Standing with eyes closed Standing head rotation with UE support Gait: Gait with cues for proper sequencing  [facility] Adult PT Treatment:                                                DATE: 09/16/22 Canalith repositioning: Appiani/cassani R&L x 2 Semont maneuver R&L x1 Therapeutic Activity: See functional tests  PATIENT EDUCATION: Education details: HEP Person educated: Patient Education method: Explanation, handout, demonstration Education comprehension: verbalized understanding and return demo  HOME EXERCISE PROGRAM:  Access Code: [email or web address] URL: [email or web address] Date: 09/19/2022 Prepared by: Margretta Ditty  Exercises - Standing with Head Rotation  - 1 x daily - 7 x weekly - 1 sets - 10 reps - Standing Heel Raise with Support  - 1 x daily - 7 x weekly - 1 sets - 15-20 reps - Sit to Stand with Armchair  - 1 x daily - 7 x weekly - 1 sets - 5 reps - Standing Balance with Eyes Closed  - 1 x daily - 7 x weekly - 1 sets - 2 reps - 10 seconds hold  GOALS: Goals reviewed with patient? Yes  SHORT TERM GOALS: Target date: 10/12/22  The patient will be indep with HEP. Baseline: Did not initiate due to BPPV Goal status: INITIAL  2.  The patient will have negative R horizontal roll test. Baseline:  + horizontal roll Goal status: MET   3.  The patient will tolerate functional testing (5 time sit to stand, Berg, gait speed) and goals to follow for LTG, as appropriate. Baseline:  PT  did not have time due to treating BPPV Goal status: INITIAL  LONG TERM GOALS: Target date: 11/09/22  The patient will be indep with  HEP Baseline: to initiate after treating BPPV Goal status: INITIAL  2.  The patient will improve 5 points on Berg balance test. Baseline: To assess for STGs. Goal status: INITIAL  3.   The patient will improve walking speed by 0.5 ft/sec. Baseline:  To assess for STGs. Goal status: INITIAL  4.  The patient will improve 5 time sit to stand by 5 seconds. Baseline:  to assess for STGs. Goal status: INITIAL  ASSESSMENT: CLINICAL IMPRESSION: Re-checked pt for BPPV. Much more obvious ageotropic nystagmus with right sidelying testing this session vs left sidelying demonstrating continued R horizontal canal involvement. Addressed with cassani/appiani with improved pt tolerance. Mild slow torsional nystagmus when testing R sidelying Dix-hallpike significant for likely R posterior canalithiasis. Checked pt's 5x STS and walking speed -- both indicate high fall risk.   From eval: Patient is a 82 y.o. female who was seen today for physical therapy evaluation and treatment for vertigo s/p a fall on 08/29/22. She presents with positive positional testing for R horizontal canalithiasis per geotropic nystagmus lasting < 1 minute. Vertigo is leading to imbalance with functional mobility and further increasing risk for falls. PT to address deficits to promote improved mobility.  OBJECTIVE IMPAIRMENTS: Abnormal gait, decreased activity tolerance, decreased balance, decreased mobility, difficulty walking, decreased strength, dizziness, and pain.   PLAN:  PT FREQUENCY: 1-2x/week  PT DURATION: 8 weeks  PLANNED INTERVENTIONS: Therapeutic exercises, Therapeutic activity, Neuromuscular re-education, Balance training, Gait training, Patient/Family education, Self Care, Joint mobilization, Vestibular training, Canalith repositioning, and Manual therapy  PLAN FOR NEXT SESSION:  recheck horizontal BPPV, treat as indicated. Begin LE strengthening and core stability to improve functional mobility. Assess Berg, gait speed and 5tsts.  Ivyana Locey, PT 09/19/2022, 2:01 PM

## 2022-09-21 DIAGNOSIS — F324 Major depressive disorder, single episode, in partial remission: Secondary | ICD-10-CM | POA: Diagnosis not present

## 2022-09-21 DIAGNOSIS — E1140 Type 2 diabetes mellitus with diabetic neuropathy, unspecified: Secondary | ICD-10-CM | POA: Diagnosis not present

## 2022-09-21 DIAGNOSIS — N1832 Chronic kidney disease, stage 3b: Secondary | ICD-10-CM | POA: Diagnosis not present

## 2022-09-21 DIAGNOSIS — E782 Mixed hyperlipidemia: Secondary | ICD-10-CM | POA: Diagnosis not present

## 2022-09-21 DIAGNOSIS — I10 Essential (primary) hypertension: Secondary | ICD-10-CM | POA: Diagnosis not present

## 2022-09-21 DIAGNOSIS — E039 Hypothyroidism, unspecified: Secondary | ICD-10-CM | POA: Diagnosis not present

## 2022-09-26 ENCOUNTER — Ambulatory Visit: Payer: Medicare Other | Admitting: Rehabilitative and Restorative Service Providers"

## 2022-09-26 ENCOUNTER — Encounter: Payer: Self-pay | Admitting: Rehabilitative and Restorative Service Providers"

## 2022-09-26 DIAGNOSIS — R42 Dizziness and giddiness: Secondary | ICD-10-CM | POA: Diagnosis not present

## 2022-09-26 DIAGNOSIS — H8111 Benign paroxysmal vertigo, right ear: Secondary | ICD-10-CM

## 2022-09-26 DIAGNOSIS — R2689 Other abnormalities of gait and mobility: Secondary | ICD-10-CM | POA: Diagnosis not present

## 2022-09-26 NOTE — Patient Instructions (Signed)
[facility] Exercise Resources   Community Programs Location and contact info description of services cost  [facility] [employer] [employer] [facility] [address] [place], [place] [phone]  [employer], silver sneakers classes, yoga, exercise equipment $18/month for 55+ $25/month individual $40-$60/month family  [facility] [address] [place], [place] [phone] Transportation to appointments. Activities (stretching, dancing, Tai Chi)  Mostly free  [facility] [address] [place], [place] [phone]  [employer] [facility] Fee based  [employer] exercise program Various locations [phone]  [facility] exercise free  [facility] Various locations [phone] Yoga, Tai Chi, Step Aerobics, Bocce, Table tennis, chair volleyball, petanque  free  [facility] [address] [place], [place]  [phone] Various activities (drum exercise, yoga, line dancing, etc)  free    

## 2022-09-26 NOTE — Therapy (Signed)
OUTPATIENT PHYSICAL THERAPY VESTIBULAR TREATMENT   Patient Name: Abigail [name] MRN: [id number] DOB:1941-04-07, 82 y.o., female Today's Date: 09/26/2022  END OF SESSION:  PT End of Session - 09/26/22 1408     Visit Number 4    Number of Visits 16    Date for PT Re-Evaluation 11/13/22    Authorization Type Medicare    PT Start Time 1406    PT Stop Time 1446    PT Time Calculation (min) 40 min    Activity Tolerance Patient tolerated treatment well    Behavior During Therapy WFL for tasks assessed/performed            Past Medical History:  Diagnosis Date   Arthritis    shoulder, knee,    Boils    near vaginal area   Chronic kidney disease    stage 3; low kidney function; BUN was high acc to pt   Constipation due to pain medication    Depression    Diabetes mellitus    Type 2   Family hx of colon cancer    Fibromyalgia    Frequency of urination    Frequent urination at night    GERD (gastroesophageal reflux disease)    pt denies   Heart murmur    History of bladder infections    Hyperlipidemia    Hypertension    Hypothyroidism    Itchy eyes    Obesity    Obesity    PONV (postoperative nausea and vomiting)    with stapedectomy and    Torn rotator cuff left   current   Past Surgical History:  Procedure Laterality Date   BACK SURGERY     lumbar fusion   BLADDER SUSPENSION N/A 02/26/2020   Procedure: TRANSVAGINAL TAPE (TVT) PROCEDURE;  Surgeon: Carrington Clamp, MD;  Location: [facility];  Service: Gynecology;  Laterality: N/A;   CARPAL TUNNEL RELEASE Right 06/29/2017   Procedure: RIGHT CARPAL TUNNEL RELEASE;  Surgeon: Cindee Salt, MD;  Location: [facility];  Service: Orthopedics;  Laterality: Right;   CHOLECYSTECTOMY  1971   COLONOSCOPY     CYSTOCELE REPAIR N/A 02/26/2020   Procedure: ANTERIOR REPAIR (CYSTOCELE);  Surgeon: Carrington Clamp, MD;  Location: [facility];  Service: Gynecology;  Laterality: N/A;    CYSTOSCOPY N/A 02/26/2020   Procedure: CYSTOSCOPY;  Surgeon: Carrington Clamp, MD;  Location: [facility];  Service: Gynecology;  Laterality: N/A;   DILATION AND CURETTAGE OF UTERUS     EYE SURGERY Bilateral 2020   MAXIMUM ACCESS (MAS)POSTERIOR LUMBAR INTERBODY FUSION (PLIF) 1 LEVEL N/A 11/07/2014   Procedure: Lumbar Four-Five Maximum access posterior lumbar interbody fusion;  Surgeon: Maeola Harman, MD;  Location: [facility];  Service: Neurosurgery;  Laterality: N/A;  L4-5 Maximum access posterior lumbar interbody fusion   SHOULDER SURGERY     bilateral rotator cuff repairs   STAPEDECTOMY Left    ear   TUBAL LIGATION  1971   VAGINAL HYSTERECTOMY N/A 02/26/2020   Procedure: HYSTERECTOMY VAGINAL;  Surgeon: Carrington Clamp, MD;  Location: [facility];  Service: Gynecology;  Laterality: N/A;   Patient Active Problem List   Diagnosis Date Noted   Postoperative state 02/26/2020   Furuncle of vulva 12/06/2016   Bilateral carpal tunnel syndrome 11/23/2016   Neck pain 11/23/2016   Primary osteoarthritis of both first carpometacarpal joints 11/23/2016   Diabetic nephropathy (HCC) 08/18/2016   Right knee pain 01/03/2016   Asymmetrical hearing loss of both  ears 02/27/2015   Pressure ulcer 11/10/2014   Spondylolisthesis of lumbar region 11/07/2014   CKD (chronic kidney disease), stage III (HCC) 07/24/2013   Chest pain 07/23/2013   Chronic pain 07/23/2013   Fibromyalgia 07/23/2013   Leukocytosis 02/07/2013   Weakness generalized 02/07/2013   Acute encephalopathy 02/05/2013   Anemia 02/05/2013   Acute renal failure (HCC) 02/05/2013   Dehydration 09/12/2012   Diabetes mellitus (HCC) 09/12/2012   Hypertension 09/12/2012   Hypothyroidism 09/12/2012   Hyperlipidemia 09/12/2012    PCP: Daisy Floro, MD REFERRING PROVIDER: Daisy Floro, MD REFERRING DIAG:  Diagnosis  R42 (ICD-10-CM) - Dizziness and giddiness   THERAPY DIAG:  BPPV (benign  paroxysmal positional vertigo), right  Dizziness and giddiness  Other abnormalities of gait and mobility  ONSET DATE: 08/29/22 Rationale for Evaluation and Treatment: Rehabilitation  SUBJECTIVE:  SUBJECTIVE STATEMENT: Pt reports she still has some dizziness when bending forward. She is doing more around her home. Pt accompanied by:  friend drove her to therapy PERTINENT HISTORY: Chronic pain (knees, back and shoulders), HTN. From eval: The patient reports vertigo began after a sudden fall on 08/29/22 in which she hit her head falling while stepping up a sidewalk. Vertigo began 1 week after the fall and is imbalance while walking and "everything spinning" and a sensation she will fall.  She denies vertigo with bed mobility, but notes she doesn't roll b/c of her back and her shoulder.  She also fractured her nasal bone during the fall. The patient reports her husband of 61 years passed away last year.  PAIN:  Are you having pain? Yes: NPRS scale: 2/10 Pain location: chronic pain back, shoulder, knees Pain description: chronic *PT will monitor, no goals to follow Aggravating factors: will monitor Relieving factors: will monitor PRECAUTIONS: Fall WEIGHT BEARING RESTRICTIONS: No FALLS: Has patient fallen in last 6 months? Yes. Number of falls 1 LIVING ENVIRONMENT: Lives with: lives alone Lives in: House/apartment Stairs: No Has following equipment at home: Single point cane PLOF: Independent PATIENT GOALS: feel more steady with walking  OBJECTIVE: (Measures in this section from initial evaluation unless otherwise noted)  Cervical ROM:  Limited mobility throughout/ modified testing positions LOWER EXTREMITY MMT:   MMT Right eval Left eval  Hip flexion    Hip abduction    Hip adduction    Hip internal rotation    Hip external rotation    Knee flexion    Knee extension    Ankle dorsiflexion    Ankle plantarflexion    Ankle inversion    Ankle eversion    (Blank rows = not  tested) BED MOBILITY:  Limited in all mobility by chronic pain/ PT assisted GAIT: Gait pattern:  slowed gait with SPC mod indep with dec'd sequencing of cane Distance walked: 100 ft VESTIBULAR ASSESSMENT: GENERAL OBSERVATION: Walks slowly into the clinic with SPC mod indep.  SYMPTOM BEHAVIOR:  Subjective history: spinning and imbalance  Non-Vestibular symptoms:  none  Type of dizziness: Spinning/Vertigo and Unsteady with head/body turns  Frequency: daily   Duration: seconds to minutes  Aggravating factors:  worse when walking and on feet moving  Relieving factors: head stationary  Progression of symptoms: worse OCULOMOTOR EXAM:  Ocular Alignment: abnormal *swelling and bruising around eyes and nose; note L eye elevated (hypertropia)  Ocular ROM: No Limitations  Spontaneous Nystagmus: absent  Gaze-Induced Nystagmus: absent  Smooth Pursuits: intact  Saccades: intact VESTIBULAR - OCULAR REFLEX:   Slow VOR: Normal and Comment: slow pace, limited ROM  Head-Impulse Test:  deferred due to guarding  POSITIONAL TESTING: Right Dix-Hallpike: unable to tolerate due to mobility issues Left Dix-Hallpike: unable to tolerate due to mobility issues Right Roll Test: geotropic nystagmus Left Roll Test: geotropic nystagmus Right Sidelying: reports mild sensation of dizziness moving into L sidelying  Left Sidelying: Note a horizontal beating nystagmus (think horizontal geotropic nystagmus) in L sidelying  *walls move with return to sitting FUNCTIONAL TESTS:  5 times sit to stand: 09/16/22 -- 31.05 sec 10 meter walk test: 09/16/22 -- 0.37 m/s (1.21 ft/sec)  Sharlene Motts Balance Scale: tba  [facility] Adult PT Treatment:                                                DATE: 09/26/22 Therapeutic Exercise: Heel raises x 10 reps Standing marching Neuromuscular re-ed: Sit<>sidelying R (-) for nystagmus and dizziness Sit<>sidelying L (-) for nystagmus and dizziness Sit to stand Berg 42/56 (improved today as  compared to last week) Standing single leg stance at countertop Head turns near countertop x 10 reps horiz and vertical Standing with eyes closed Therapeutic Activity: 5 TIME SIT TO STAND= 28 seconds Gait: X 180 feet x cues for heel strike 1.53 FT/SEC (0.47 m/s) Self Care: Community exercise class handout provided with emphasis on increasing activities safely   [facility] Adult PT Treatment:                                                DATE: 09/19/22 Therapeutic Exercise: Sit to stand for HEP Heel raises Neuromuscular re-ed: Horizontal roll test (-) to both sides Berg balance scale (interrupted mid Berg due to notification on smartphone her blood sugar was low) Rested and provided with granola bar and patient waited to allow blood sugar to come up Berg=34/56 Standing with eyes closed Standing head rotation with UE support Gait: Gait with cues for proper sequencing  [facility] Adult PT Treatment:                                                DATE: 09/16/22 Canalith repositioning: Appiani/cassani R&L x 2 Semont maneuver R&L x1 Therapeutic Activity: See functional tests  PATIENT EDUCATION: Education details: HEP Person educated: Patient Education method: Explanation, handout, demonstration Education comprehension: verbalized understanding and return demo  HOME EXERCISE PROGRAM:  Access Code: [email or web address] URL: [email or web address] Date: 09/26/2022 Prepared by: Margretta Ditty  Exercises - Standing with Head Rotation  - 1 x daily - 7 x weekly - 1 sets - 10 reps - Sit to Stand with Armchair  - 1 x daily - 7 x weekly - 1 sets - 5 reps - Standing Balance with Eyes Closed  - 1 x daily - 7 x weekly - 1 sets - 2 reps - 10 seconds hold - Standing Heel Raise with Support  - 1 x daily - 7 x weekly - 1 sets - 15-20 reps - Standing Single Leg Stance with Counter Support  - 2 x daily - 7 x weekly - 1 sets - 10 reps  GOALS: Goals reviewed with patient? Yes  SHORT TERM GOALS:  Target date:  10/12/22  The patient will be indep with HEP. Baseline: Did not initiate due to BPPV Goal status: MET  2.  The patient will have negative R horizontal roll test. Baseline:  + horizontal roll Goal status: MET   3.  The patient will tolerate functional testing (5 time sit to stand, Berg, gait speed) and goals to follow for LTG, as appropriate. Baseline:  PT did not have time due to treating BPPV Goal status: MET  LONG TERM GOALS: Target date: 11/09/22  The patient will be indep with HEP Baseline: to initiate after treating BPPV Goal status: MET  2.  The patient will improve 5 points on Berg balance test. Baseline: To assess for STGs. Goal status:MET  3.   The patient will improve walking speed by 0.5 ft/sec. Baseline:  To assess for STGs. Goal status: PARTIALLY MET (1.2 UP TO 1.5 FT/SEC  4.  The patient will improve 5 time sit to stand by 5 seconds. Baseline:  to assess for STGs. Goal status:Improved from 31 to 28 seconds PARTIALLY MET  ASSESSMENT: CLINICAL IMPRESSION: The patient met 2 LTGS and partially met 2 LTGs.  She has already made progress with HEP since vertigo improved and PT provided community exercise handout. Plan to follow up in 1 week and assess progress and determine if further therapy needed.   From eval: Patient is a 82 y.o. female who was seen today for physical therapy evaluation and treatment for vertigo s/p a fall on 08/29/22. She presents with positive positional testing for R horizontal canalithiasis per geotropic nystagmus lasting < 1 minute. Vertigo is leading to imbalance with functional mobility and further increasing risk for falls. PT to address deficits to promote improved mobility.  OBJECTIVE IMPAIRMENTS: Abnormal gait, decreased activity tolerance, decreased balance, decreased mobility, difficulty walking, decreased strength, dizziness, and pain.   PLAN:  PT FREQUENCY: 1-2x/week  PT DURATION: 8 weeks  PLANNED INTERVENTIONS:  Therapeutic exercises, Therapeutic activity, Neuromuscular re-education, Balance training, Gait training, Patient/Family education, Self Care, Joint mobilization, Vestibular training, Canalith repositioning, and Manual therapy  PLAN FOR NEXT SESSION: check on HEP and recheck unmet LTGs.   Emilianna Barlowe, PT 09/26/2022, 2:08 PM

## 2022-09-30 ENCOUNTER — Ambulatory Visit: Payer: Medicare Other | Admitting: Rehabilitative and Restorative Service Providers"

## 2022-09-30 DIAGNOSIS — M180 Bilateral primary osteoarthritis of first carpometacarpal joints: Secondary | ICD-10-CM | POA: Diagnosis not present

## 2022-09-30 DIAGNOSIS — Z789 Other specified health status: Secondary | ICD-10-CM | POA: Diagnosis not present

## 2022-10-07 ENCOUNTER — Ambulatory Visit: Payer: Medicare Other | Attending: Family Medicine | Admitting: Rehabilitative and Restorative Service Providers"

## 2022-10-07 ENCOUNTER — Encounter: Payer: Self-pay | Admitting: Rehabilitative and Restorative Service Providers"

## 2022-10-07 DIAGNOSIS — R2689 Other abnormalities of gait and mobility: Secondary | ICD-10-CM | POA: Insufficient documentation

## 2022-10-07 DIAGNOSIS — R42 Dizziness and giddiness: Secondary | ICD-10-CM | POA: Diagnosis not present

## 2022-10-07 DIAGNOSIS — H8111 Benign paroxysmal vertigo, right ear: Secondary | ICD-10-CM

## 2022-10-07 NOTE — Therapy (Signed)
OUTPATIENT PHYSICAL THERAPY VESTIBULAR TREATMENT   Patient Name: Abigail [name] MRN: [id number] DOB:1941-05-23, 82 y.o., female Today's Date: 10/07/2022  END OF SESSION:  PT End of Session - 10/07/22 1107     Visit Number 5    Number of Visits 16    Date for PT Re-Evaluation 11/13/22    Authorization Type Medicare    PT Start Time 1106    PT Stop Time 1150    PT Time Calculation (min) 44 min    Activity Tolerance Patient tolerated treatment well    Behavior During Therapy WFL for tasks assessed/performed            Past Medical History:  Diagnosis Date   Arthritis    shoulder, knee,    Boils    near vaginal area   Chronic kidney disease    stage 3; low kidney function; BUN was high acc to pt   Constipation due to pain medication    Depression    Diabetes mellitus    Type 2   Family hx of colon cancer    Fibromyalgia    Frequency of urination    Frequent urination at night    GERD (gastroesophageal reflux disease)    pt denies   Heart murmur    History of bladder infections    Hyperlipidemia    Hypertension    Hypothyroidism    Itchy eyes    Obesity    Obesity    PONV (postoperative nausea and vomiting)    with stapedectomy and    Torn rotator cuff left   current   Past Surgical History:  Procedure Laterality Date   BACK SURGERY     lumbar fusion   BLADDER SUSPENSION N/A 02/26/2020   Procedure: TRANSVAGINAL TAPE (TVT) PROCEDURE;  Surgeon: Carrington Clamp, MD;  Location: [facility];  Service: Gynecology;  Laterality: N/A;   CARPAL TUNNEL RELEASE Right 06/29/2017   Procedure: RIGHT CARPAL TUNNEL RELEASE;  Surgeon: Cindee Salt, MD;  Location: [facility];  Service: Orthopedics;  Laterality: Right;   CHOLECYSTECTOMY  1971   COLONOSCOPY     CYSTOCELE REPAIR N/A 02/26/2020   Procedure: ANTERIOR REPAIR (CYSTOCELE);  Surgeon: Carrington Clamp, MD;  Location: [facility];  Service: Gynecology;  Laterality: N/A;    CYSTOSCOPY N/A 02/26/2020   Procedure: CYSTOSCOPY;  Surgeon: Carrington Clamp, MD;  Location: [facility];  Service: Gynecology;  Laterality: N/A;   DILATION AND CURETTAGE OF UTERUS     EYE SURGERY Bilateral 2020   MAXIMUM ACCESS (MAS)POSTERIOR LUMBAR INTERBODY FUSION (PLIF) 1 LEVEL N/A 11/07/2014   Procedure: Lumbar Four-Five Maximum access posterior lumbar interbody fusion;  Surgeon: Maeola Harman, MD;  Location: [facility];  Service: Neurosurgery;  Laterality: N/A;  L4-5 Maximum access posterior lumbar interbody fusion   SHOULDER SURGERY     bilateral rotator cuff repairs   STAPEDECTOMY Left    ear   TUBAL LIGATION  1971   VAGINAL HYSTERECTOMY N/A 02/26/2020   Procedure: HYSTERECTOMY VAGINAL;  Surgeon: Carrington Clamp, MD;  Location: [facility];  Service: Gynecology;  Laterality: N/A;   Patient Active Problem List   Diagnosis Date Noted   Postoperative state 02/26/2020   Furuncle of vulva 12/06/2016   Bilateral carpal tunnel syndrome 11/23/2016   Neck pain 11/23/2016   Primary osteoarthritis of both first carpometacarpal joints 11/23/2016   Diabetic nephropathy (HCC) 08/18/2016   Right knee pain 01/03/2016   Asymmetrical hearing loss of both  ears 02/27/2015   Pressure ulcer 11/10/2014   Spondylolisthesis of lumbar region 11/07/2014   CKD (chronic kidney disease), stage III (HCC) 07/24/2013   Chest pain 07/23/2013   Chronic pain 07/23/2013   Fibromyalgia 07/23/2013   Leukocytosis 02/07/2013   Weakness generalized 02/07/2013   Acute encephalopathy 02/05/2013   Anemia 02/05/2013   Acute renal failure (HCC) 02/05/2013   Dehydration 09/12/2012   Diabetes mellitus (HCC) 09/12/2012   Hypertension 09/12/2012   Hypothyroidism 09/12/2012   Hyperlipidemia 09/12/2012    PCP: Daisy Floro, MD REFERRING PROVIDER: Daisy Floro, MD REFERRING DIAG:  Diagnosis  R42 (ICD-10-CM) - Dizziness and giddiness   THERAPY DIAG:  BPPV (benign  paroxysmal positional vertigo), right  Dizziness and giddiness  Other abnormalities of gait and mobility  ONSET DATE: 08/29/22 Rationale for Evaluation and Treatment: Rehabilitation  SUBJECTIVE:  SUBJECTIVE STATEMENT: Pt reports she is still having a hard time with balance. She catches her foot on the ground when walking in and takes multiple steps to recover.  Pt accompanied by:  friend drove her to therapy PERTINENT HISTORY: Chronic pain (knees, back and shoulders), HTN. From eval: The patient reports vertigo began after a sudden fall on 08/29/22 in which she hit her head falling while stepping up a sidewalk. Vertigo began 1 week after the fall and is imbalance while walking and "everything spinning" and a sensation she will fall.  She denies vertigo with bed mobility, but notes she doesn't roll b/c of her back and her shoulder.  She also fractured her nasal bone during the fall. The patient reports her husband of 61 years passed away last year.  PAIN:  Are you having pain? Yes: NPRS scale: "midway"/10 Pain location: chronic pain back, shoulder, knees Pain description: chronic *PT will monitor, no goals to follow Aggravating factors: will monitor Relieving factors: will monitor PRECAUTIONS: Fall WEIGHT BEARING RESTRICTIONS: No FALLS: Has patient fallen in last 6 months? Yes. Number of falls 1 PATIENT GOALS: feel more steady with walking  OBJECTIVE: (Measures in this section from initial evaluation unless otherwise noted) Cervical ROM:  Limited mobility throughout/ modified testing positions LOWER EXTREMITY MMT:   MMT Right eval Left eval  Hip flexion    Hip abduction    Hip adduction    Hip internal rotation    Hip external rotation    Knee flexion    Knee extension    Ankle dorsiflexion    Ankle plantarflexion    Ankle inversion    Ankle eversion    (Blank rows = not tested) BED MOBILITY:  Limited in all mobility by chronic pain/ PT assisted GAIT: Gait pattern:  slowed  gait with SPC mod indep with dec'd sequencing of cane Distance walked: 100 ft VESTIBULAR ASSESSMENT: GENERAL OBSERVATION: Walks slowly into the clinic with SPC mod indep.  SYMPTOM BEHAVIOR:  Subjective history: spinning and imbalance  Non-Vestibular symptoms:  none  Type of dizziness: Spinning/Vertigo and Unsteady with head/body turns  Frequency: daily   Duration: seconds to minutes  Aggravating factors:  worse when walking and on feet moving  Relieving factors: head stationary  Progression of symptoms: worse OCULOMOTOR EXAM:  Ocular Alignment: abnormal *swelling and bruising around eyes and nose; note L eye elevated (hypertropia)  Ocular ROM: No Limitations  Spontaneous Nystagmus: absent  Gaze-Induced Nystagmus: absent  Smooth Pursuits: intact  Saccades: intact VESTIBULAR - OCULAR REFLEX:   Slow VOR: Normal and Comment: slow pace, limited ROM  Head-Impulse Test:  deferred due to guarding  POSITIONAL TESTING:  Right Dix-Hallpike: unable to tolerate due to mobility issues Left Dix-Hallpike: unable to tolerate due to mobility issues Right Roll Test: geotropic nystagmus Left Roll Test: geotropic nystagmus Right Sidelying: reports mild sensation of dizziness moving into L sidelying  Left Sidelying: Note a horizontal beating nystagmus (think horizontal geotropic nystagmus) in L sidelying  *walls move with return to sitting FUNCTIONAL TESTS:  5 times sit to stand: 09/16/22 -- 31.05 sec 10 meter walk test: 09/16/22 -- 0.37 m/s (1.21 ft/sec)  Sharlene Motts Balance Scale: tba   Sharlene Motts assessed 42/56 on 09/26/22  [facility] Adult PT Treatment:                                                DATE: 10/07/22 Therapeutic Exercise: Standing Sit<>stand x 5 reps dec'ing UE support Step taps to 4" step R and L with UE support, dec'ing UE support as able with CGA Side stepping with intermittent UE support Marching with one UE support on rail Supine Marching Bridging x 2 reps SLR x 10 reps R And L  sides Sidelying Hip abduction x 12 reps R And L Upon return to sitting patient felt pulled posteriorly and noted dizziness/vertigo Neuromuscular re-ed: Single leg standing with intermittent UE Support. Retested L sidelying- no nystagmus. Able to help patient position to modify a R dix hallpike with + upbeating, rotary nystagmus for posterior canalithiasis BPPV. Canolith repositioning: R Epley's maneuver with min to mod A for help to transition to sitting position. Patient needed help to stabilize. Walked patient out to her car after procedure since she did not bring her cane in today.  [facility] Adult PT Treatment:                                                DATE: 09/26/22 Therapeutic Exercise: Heel raises x 10 reps Standing marching Neuromuscular re-ed: Sit<>sidelying R (-) for nystagmus and dizziness Sit<>sidelying L (-) for nystagmus and dizziness Sit to stand Berg 42/56 (improved today as compared to last week) Standing single leg stance at countertop Head turns near countertop x 10 reps horiz and vertical Standing with eyes closed Therapeutic Activity: 5 TIME SIT TO STAND= 28 seconds Gait: X 180 feet x cues for heel strike 1.53 FT/SEC (0.47 m/s) Self Care: Community exercise class handout provided with emphasis on increasing activities safely   PATIENT EDUCATION: Education details: handout on local tai chi (community classes at [place] and [employer]) Person educated: Patient Education method: Programmer, multimedia, handout, demonstration Education comprehension: verbalized understanding and return demo  HOME EXERCISE PROGRAM:  Access Code: [email or web address] URL: [email or web address] Date: 09/26/2022 Prepared by: Margretta Ditty  Exercises - Standing with Head Rotation  - 1 x daily - 7 x weekly - 1 sets - 10 reps - Sit to Stand with Armchair  - 1 x daily - 7 x weekly - 1 sets - 5 reps - Standing Balance with Eyes Closed  - 1 x daily - 7 x weekly - 1 sets - 2  reps - 10 seconds hold - Standing Heel Raise with Support  - 1 x daily - 7 x weekly - 1 sets - 15-20 reps - Standing Single Leg Stance with Counter Support  - 2 x daily - 7  x weekly - 1 sets - 10 reps  GOALS: Goals reviewed with patient? Yes  SHORT TERM GOALS: Target date: 10/12/22  The patient will be indep with HEP. Baseline: Did not initiate due to BPPV Goal status: MET  2.  The patient will have negative R horizontal roll test. Baseline:  + horizontal roll Goal status: MET   3.  The patient will tolerate functional testing (5 time sit to stand, Berg, gait speed) and goals to follow for LTG, as appropriate. Baseline:  PT did not have time due to treating BPPV Goal status: MET  LONG TERM GOALS: Target date: 11/09/22  The patient will be indep with HEP Baseline: to initiate after treating BPPV Goal status: MET  2.  The patient will improve 5 points on Berg balance test. Baseline: To assess for STGs. Goal status:MET  3.   The patient will improve walking speed by 0.5 ft/sec. Baseline:  To assess for STGs. Goal status: PARTIALLY MET (1.2 UP TO 1.5 FT/SEC  4.  The patient will improve 5 time sit to stand by 5 seconds. Baseline:  to assess for STGs. Goal status:Improved from 31 to 28 seconds PARTIALLY MET  ASSESSMENT: CLINICAL IMPRESSION: The patient has not had episodes of vertigo at home, however, after supine and sidelying exercise in clinic, she had a return of vertigo when sitting (needed assist). She is + for R dixhallpike and PT treated today. Plan to continue to treat BPPV as indicated and progress LE strengthening and balance activities in clinic. We discussed community exercise and provided handout for local resources.  From eval: Patient is a 82 y.o. female who was seen today for physical therapy evaluation and treatment for vertigo s/p a fall on 08/29/22. She presents with positive positional testing for R horizontal canalithiasis per geotropic nystagmus lasting < 1  minute. Vertigo is leading to imbalance with functional mobility and further increasing risk for falls. PT to address deficits to promote improved mobility.  OBJECTIVE IMPAIRMENTS: Abnormal gait, decreased activity tolerance, decreased balance, decreased mobility, difficulty walking, decreased strength, dizziness, and pain.   PLAN:  PT FREQUENCY: 1-2x/week  PT DURATION: 8 weeks  PLANNED INTERVENTIONS: Therapeutic exercises, Therapeutic activity, Neuromuscular re-education, Balance training, Gait training, Patient/Family education, Self Care, Joint mobilization, Vestibular training, Canalith repositioning, and Manual therapy  PLAN FOR NEXT SESSION: check on HEP and recheck unmet LTGs. Treat BPPV and focus on balance for improved functional mobility.   Gwyn Mehring, PT 10/07/2022, 1:16 PM

## 2022-10-11 DIAGNOSIS — D3132 Benign neoplasm of left choroid: Secondary | ICD-10-CM | POA: Diagnosis not present

## 2022-10-11 DIAGNOSIS — Z961 Presence of intraocular lens: Secondary | ICD-10-CM | POA: Diagnosis not present

## 2022-10-11 DIAGNOSIS — E113293 Type 2 diabetes mellitus with mild nonproliferative diabetic retinopathy without macular edema, bilateral: Secondary | ICD-10-CM | POA: Diagnosis not present

## 2022-10-11 DIAGNOSIS — H16223 Keratoconjunctivitis sicca, not specified as Sjogren's, bilateral: Secondary | ICD-10-CM | POA: Diagnosis not present

## 2022-10-11 DIAGNOSIS — H353132 Nonexudative age-related macular degeneration, bilateral, intermediate dry stage: Secondary | ICD-10-CM | POA: Diagnosis not present

## 2022-10-11 DIAGNOSIS — H16103 Unspecified superficial keratitis, bilateral: Secondary | ICD-10-CM | POA: Diagnosis not present

## 2022-10-12 ENCOUNTER — Ambulatory Visit: Payer: Medicare Other | Admitting: Rehabilitative and Restorative Service Providers"

## 2022-10-12 ENCOUNTER — Encounter: Payer: Self-pay | Admitting: Rehabilitative and Restorative Service Providers"

## 2022-10-12 DIAGNOSIS — H8111 Benign paroxysmal vertigo, right ear: Secondary | ICD-10-CM | POA: Diagnosis not present

## 2022-10-12 DIAGNOSIS — R2689 Other abnormalities of gait and mobility: Secondary | ICD-10-CM

## 2022-10-12 DIAGNOSIS — R42 Dizziness and giddiness: Secondary | ICD-10-CM | POA: Diagnosis not present

## 2022-10-12 NOTE — Therapy (Signed)
OUTPATIENT PHYSICAL THERAPY VESTIBULAR TREATMENT   Patient Name: Abigail [name] MRN: [id number] DOB:June 20, 1940, 82 y.o., female Today's Date: 10/12/2022  END OF SESSION:  PT End of Session - 10/12/22 1152     Visit Number 6    Number of Visits 16    Date for PT Re-Evaluation 11/13/22    Authorization Type Medicare    PT Start Time 1152    PT Stop Time 1223    PT Time Calculation (min) 31 min    Activity Tolerance Patient tolerated treatment well    Behavior During Therapy WFL for tasks assessed/performed            Past Medical History:  Diagnosis Date   Arthritis    shoulder, knee,    Boils    near vaginal area   Chronic kidney disease    stage 3; low kidney function; BUN was high acc to pt   Constipation due to pain medication    Depression    Diabetes mellitus    Type 2   Family hx of colon cancer    Fibromyalgia    Frequency of urination    Frequent urination at night    GERD (gastroesophageal reflux disease)    pt denies   Heart murmur    History of bladder infections    Hyperlipidemia    Hypertension    Hypothyroidism    Itchy eyes    Obesity    Obesity    PONV (postoperative nausea and vomiting)    with stapedectomy and    Torn rotator cuff left   current   Past Surgical History:  Procedure Laterality Date   BACK SURGERY     lumbar fusion   BLADDER SUSPENSION N/A 02/26/2020   Procedure: TRANSVAGINAL TAPE (TVT) PROCEDURE;  Surgeon: Carrington Clamp, MD;  Location: [facility];  Service: Gynecology;  Laterality: N/A;   CARPAL TUNNEL RELEASE Right 06/29/2017   Procedure: RIGHT CARPAL TUNNEL RELEASE;  Surgeon: Cindee Salt, MD;  Location: [facility];  Service: Orthopedics;  Laterality: Right;   CHOLECYSTECTOMY  1971   COLONOSCOPY     CYSTOCELE REPAIR N/A 02/26/2020   Procedure: ANTERIOR REPAIR (CYSTOCELE);  Surgeon: Carrington Clamp, MD;  Location: [facility];  Service: Gynecology;  Laterality: N/A;    CYSTOSCOPY N/A 02/26/2020   Procedure: CYSTOSCOPY;  Surgeon: Carrington Clamp, MD;  Location: [facility];  Service: Gynecology;  Laterality: N/A;   DILATION AND CURETTAGE OF UTERUS     EYE SURGERY Bilateral 2020   MAXIMUM ACCESS (MAS)POSTERIOR LUMBAR INTERBODY FUSION (PLIF) 1 LEVEL N/A 11/07/2014   Procedure: Lumbar Four-Five Maximum access posterior lumbar interbody fusion;  Surgeon: Maeola Harman, MD;  Location: [facility];  Service: Neurosurgery;  Laterality: N/A;  L4-5 Maximum access posterior lumbar interbody fusion   SHOULDER SURGERY     bilateral rotator cuff repairs   STAPEDECTOMY Left    ear   TUBAL LIGATION  1971   VAGINAL HYSTERECTOMY N/A 02/26/2020   Procedure: HYSTERECTOMY VAGINAL;  Surgeon: Carrington Clamp, MD;  Location: [facility];  Service: Gynecology;  Laterality: N/A;   Patient Active Problem List   Diagnosis Date Noted   Postoperative state 02/26/2020   Furuncle of vulva 12/06/2016   Bilateral carpal tunnel syndrome 11/23/2016   Neck pain 11/23/2016   Primary osteoarthritis of both first carpometacarpal joints 11/23/2016   Diabetic nephropathy (HCC) 08/18/2016   Right knee pain 01/03/2016   Asymmetrical hearing loss of both  ears 02/27/2015   Pressure ulcer 11/10/2014   Spondylolisthesis of lumbar region 11/07/2014   CKD (chronic kidney disease), stage III (HCC) 07/24/2013   Chest pain 07/23/2013   Chronic pain 07/23/2013   Fibromyalgia 07/23/2013   Leukocytosis 02/07/2013   Weakness generalized 02/07/2013   Acute encephalopathy 02/05/2013   Anemia 02/05/2013   Acute renal failure (HCC) 02/05/2013   Dehydration 09/12/2012   Diabetes mellitus (HCC) 09/12/2012   Hypertension 09/12/2012   Hypothyroidism 09/12/2012   Hyperlipidemia 09/12/2012    PCP: Daisy Floro, MD REFERRING PROVIDER: Daisy Floro, MD REFERRING DIAG:  Diagnosis  R42 (ICD-10-CM) - Dizziness and giddiness   THERAPY DIAG:  BPPV (benign  paroxysmal positional vertigo), right  Dizziness and giddiness  Other abnormalities of gait and mobility  ONSET DATE: 08/29/22 Rationale for Evaluation and Treatment: Rehabilitation  SUBJECTIVE:  SUBJECTIVE STATEMENT: Pt reports she is still having a hard time with balance. She catches her foot on the ground when walking in and takes multiple steps to recover.  Pt accompanied by:  friend drove her to therapy PERTINENT HISTORY: Chronic pain (knees, back and shoulders), HTN. From eval: The patient reports vertigo began after a sudden fall on 08/29/22 in which she hit her head falling while stepping up a sidewalk. Vertigo began 1 week after the fall and is imbalance while walking and "everything spinning" and a sensation she will fall.  She denies vertigo with bed mobility, but notes she doesn't roll b/c of her back and her shoulder.  She also fractured her nasal bone during the fall. The patient reports her husband of 61 years passed away last year.  PAIN:  Are you having pain? Yes: NPRS scale: "midway"/10 Pain location: chronic pain back, shoulder, knees Pain description: chronic *PT will monitor, no goals to follow Aggravating factors: will monitor Relieving factors: will monitor PRECAUTIONS: Fall WEIGHT BEARING RESTRICTIONS: No FALLS: Has patient fallen in last 6 months? Yes. Number of falls 1 PATIENT GOALS: feel more steady with walking  OBJECTIVE: (Measures in this section from initial evaluation unless otherwise noted) Cervical ROM:  Limited mobility throughout/ modified testing positions LOWER EXTREMITY MMT:   MMT Right eval Left eval  Hip flexion    Hip abduction    Hip adduction    Hip internal rotation    Hip external rotation    Knee flexion    Knee extension    Ankle dorsiflexion    Ankle plantarflexion    Ankle inversion    Ankle eversion    (Blank rows = not tested) BED MOBILITY:  Limited in all mobility by chronic pain/ PT assisted GAIT: Gait pattern:  slowed  gait with SPC mod indep with dec'd sequencing of cane Distance walked: 100 ft VESTIBULAR ASSESSMENT: GENERAL OBSERVATION: Walks slowly into the clinic with SPC mod indep.  SYMPTOM BEHAVIOR:  Subjective history: spinning and imbalance  Non-Vestibular symptoms:  none  Type of dizziness: Spinning/Vertigo and Unsteady with head/body turns  Frequency: daily   Duration: seconds to minutes  Aggravating factors:  worse when walking and on feet moving  Relieving factors: head stationary  Progression of symptoms: worse OCULOMOTOR EXAM:  Ocular Alignment: abnormal *swelling and bruising around eyes and nose; note L eye elevated (hypertropia)  Ocular ROM: No Limitations  Spontaneous Nystagmus: absent  Gaze-Induced Nystagmus: absent  Smooth Pursuits: intact  Saccades: intact VESTIBULAR - OCULAR REFLEX:   Slow VOR: Normal and Comment: slow pace, limited ROM  Head-Impulse Test:  deferred due to guarding  POSITIONAL TESTING:  Right Dix-Hallpike: unable to tolerate due to mobility issues Left Dix-Hallpike: unable to tolerate due to mobility issues Right Roll Test: geotropic nystagmus Left Roll Test: geotropic nystagmus Right Sidelying: reports mild sensation of dizziness moving into L sidelying  Left Sidelying: Note a horizontal beating nystagmus (think horizontal geotropic nystagmus) in L sidelying  *walls move with return to sitting FUNCTIONAL TESTS:  5 times sit to stand: 09/16/22 -- 31.05 sec 10 meter walk test: 09/16/22 -- 0.37 m/s (1.21 ft/sec)  Sharlene Motts Balance Scale: tba   Sharlene Motts assessed 42/56 on 09/26/22  [facility] Adult PT Treatment:                                                DATE: 10/12/22 Neuromuscular re-ed: Reviewed HEP for balance modifying by removing head rotation there ex at thistime. Retested positional symptoms: R sidelying= positive for large amplitude upbeating, rotary nystagmus L sidelying=no nystagmus or dizziness After CRT, retested right sidelying with no nystagmus Canolith  repositioning: Modified R epley using pillows behind back and holding positions longer due to severity of symptoms when lying back to the right    [facility] Adult PT Treatment:                                                DATE: 10/07/22 Therapeutic Exercise: Standing Sit<>stand x 5 reps dec'ing UE support Step taps to 4" step R and L with UE support, dec'ing UE support as able with CGA Side stepping with intermittent UE support Marching with one UE support on rail Supine Marching Bridging x 2 reps SLR x 10 reps R And L sides Sidelying Hip abduction x 12 reps R And L Upon return to sitting patient felt pulled posteriorly and noted dizziness/vertigo Neuromuscular re-ed: Single leg standing with intermittent UE Support. Retested L sidelying- no nystagmus. Able to help patient position to modify a R dix hallpike with + upbeating, rotary nystagmus for posterior canalithiasis BPPV. Canolith repositioning: R Epley's maneuver with min to mod A for help to transition to sitting position. Patient needed help to stabilize. Walked patient out to her car after procedure since she did not bring her cane in today.  [facility] Adult PT Treatment:                                                DATE: 09/26/22 Therapeutic Exercise: Heel raises x 10 reps Standing marching Neuromuscular re-ed: Sit<>sidelying R (-) for nystagmus and dizziness Sit<>sidelying L (-) for nystagmus and dizziness Sit to stand Berg 42/56 (improved today as compared to last week) Standing single leg stance at countertop Head turns near countertop x 10 reps horiz and vertical Standing with eyes closed Therapeutic Activity: 5 TIME SIT TO STAND= 28 seconds Gait: X 180 feet x cues for heel strike 1.53 FT/SEC (0.47 m/s) Self Care: Community exercise class handout provided with emphasis on increasing activities safely   PATIENT EDUCATION: Education details: handout on local tai chi (community classes at [place] and [employer] [place]  and rec) Person educated: Patient Education method: Programmer, multimedia, handout, demonstration Education comprehension: verbalized understanding and return demo  HOME EXERCISE PROGRAM:  Access Code: [email or web address] URL: [email or web address] Date: 10/12/2022 Prepared by: Margretta Ditty  Exercises - Sit to Stand with Armchair  - 1 x daily - 7 x weekly - 1 sets - 5 reps - Standing Balance with Eyes Closed  - 1 x daily - 7 x weekly - 1 sets - 2 reps - 10 seconds hold - Standing Heel Raise with Support  - 1 x daily - 7 x weekly - 1 sets - 15-20 reps - Standing Single Leg Stance with Counter Support  - 2 x daily - 7 x weekly - 1 sets - 10 reps  GOALS: Goals reviewed with patient? Yes  SHORT TERM GOALS: Target date: 10/12/22  The patient will be indep with HEP. Baseline: Did not initiate due to BPPV Goal status: MET  2.  The patient will have negative R horizontal roll test. Baseline:  + horizontal roll Goal status: MET   3.  The patient will tolerate functional testing (5 time sit to stand, Berg, gait speed) and goals to follow for LTG, as appropriate. Baseline:  PT did not have time due to treating BPPV Goal status: MET  LONG TERM GOALS: Target date: 11/09/22  The patient will be indep with HEP Baseline: to initiate after treating BPPV Goal status: MET  2.  The patient will improve 5 points on Berg balance test. Baseline: To assess for STGs. Goal status:MET  3.   The patient will improve walking speed by 0.5 ft/sec. Baseline:  To assess for STGs. Goal status: PARTIALLY MET (1.2 UP TO 1.5 FT/SEC  4.  The patient will improve 5 time sit to stand by 5 seconds. Baseline:  to assess for STGs. Goal status:Improved from 31 to 28 seconds PARTIALLY MET  ASSESSMENT: CLINICAL IMPRESSION: The patient has negative sidelying test after epley's maneuver. Modified HEP for balance. Plan to progress to LTG for walking speed and 5 time sit to stand. Also plan to progress balance as  patient feels she is not at baseline (prior to fall).   From eval: Patient is a 82 y.o. female who was seen today for physical therapy evaluation and treatment for vertigo s/p a fall on 08/29/22. She presents with positive positional testing for R horizontal canalithiasis per geotropic nystagmus lasting < 1 minute. Vertigo is leading to imbalance with functional mobility and further increasing risk for falls. PT to address deficits to promote improved mobility.  OBJECTIVE IMPAIRMENTS: Abnormal gait, decreased activity tolerance, decreased balance, decreased mobility, difficulty walking, decreased strength, dizziness, and pain.   PLAN:  PT FREQUENCY: 1-2x/week  PT DURATION: 8 weeks  PLANNED INTERVENTIONS: Therapeutic exercises, Therapeutic activity, Neuromuscular re-education, Balance training, Gait training, Patient/Family education, Self Care, Joint mobilization, Vestibular training, Canalith repositioning, and Manual therapy  PLAN FOR NEXT SESSION: check BPPV and treat if indicated. Progress balance and mobility activities.   Kyllie Pettijohn, PT 10/12/2022, 12:23 PM

## 2022-10-13 DIAGNOSIS — R42 Dizziness and giddiness: Secondary | ICD-10-CM | POA: Diagnosis not present

## 2022-10-13 DIAGNOSIS — R609 Edema, unspecified: Secondary | ICD-10-CM | POA: Diagnosis not present

## 2022-10-13 DIAGNOSIS — Z6841 Body Mass Index (BMI) 40.0 and over, adult: Secondary | ICD-10-CM | POA: Diagnosis not present

## 2022-10-13 DIAGNOSIS — Z09 Encounter for follow-up examination after completed treatment for conditions other than malignant neoplasm: Secondary | ICD-10-CM | POA: Diagnosis not present

## 2022-10-13 DIAGNOSIS — R2689 Other abnormalities of gait and mobility: Secondary | ICD-10-CM | POA: Diagnosis not present

## 2022-10-13 DIAGNOSIS — S0083XD Contusion of other part of head, subsequent encounter: Secondary | ICD-10-CM | POA: Diagnosis not present

## 2022-10-19 ENCOUNTER — Encounter: Payer: Self-pay | Admitting: Physical Therapy

## 2022-10-19 ENCOUNTER — Ambulatory Visit: Payer: Medicare Other | Admitting: Physical Therapy

## 2022-10-19 DIAGNOSIS — R42 Dizziness and giddiness: Secondary | ICD-10-CM | POA: Diagnosis not present

## 2022-10-19 DIAGNOSIS — R2689 Other abnormalities of gait and mobility: Secondary | ICD-10-CM

## 2022-10-19 DIAGNOSIS — H8111 Benign paroxysmal vertigo, right ear: Secondary | ICD-10-CM | POA: Diagnosis not present

## 2022-10-19 NOTE — Therapy (Signed)
OUTPATIENT PHYSICAL THERAPY VESTIBULAR TREATMENT   Patient Name: Abigail [name] MRN: [id number] DOB:17-Jun-1940, 82 y.o., female Today's Date: 10/19/2022  END OF SESSION:  PT End of Session - 10/19/22 1321     Visit Number 7    Number of Visits 16    Date for PT Re-Evaluation 11/13/22    Authorization Type Medicare    PT Start Time 1317    PT Stop Time 1400    PT Time Calculation (min) 43 min    Activity Tolerance Patient tolerated treatment well    Behavior During Therapy WFL for tasks assessed/performed            Past Medical History:  Diagnosis Date   Arthritis    shoulder, knee,    Boils    near vaginal area   Chronic kidney disease    stage 3; low kidney function; BUN was high acc to pt   Constipation due to pain medication    Depression    Diabetes mellitus    Type 2   Family hx of colon cancer    Fibromyalgia    Frequency of urination    Frequent urination at night    GERD (gastroesophageal reflux disease)    pt denies   Heart murmur    History of bladder infections    Hyperlipidemia    Hypertension    Hypothyroidism    Itchy eyes    Obesity    Obesity    PONV (postoperative nausea and vomiting)    with stapedectomy and    Torn rotator cuff left   current   Past Surgical History:  Procedure Laterality Date   BACK SURGERY     lumbar fusion   BLADDER SUSPENSION N/A 02/26/2020   Procedure: TRANSVAGINAL TAPE (TVT) PROCEDURE;  Surgeon: Carrington Clamp, MD;  Location: [facility];  Service: Gynecology;  Laterality: N/A;   CARPAL TUNNEL RELEASE Right 06/29/2017   Procedure: RIGHT CARPAL TUNNEL RELEASE;  Surgeon: Cindee Salt, MD;  Location: [facility];  Service: Orthopedics;  Laterality: Right;   CHOLECYSTECTOMY  1971   COLONOSCOPY     CYSTOCELE REPAIR N/A 02/26/2020   Procedure: ANTERIOR REPAIR (CYSTOCELE);  Surgeon: Carrington Clamp, MD;  Location: [facility];  Service: Gynecology;  Laterality: N/A;    CYSTOSCOPY N/A 02/26/2020   Procedure: CYSTOSCOPY;  Surgeon: Carrington Clamp, MD;  Location: [facility];  Service: Gynecology;  Laterality: N/A;   DILATION AND CURETTAGE OF UTERUS     EYE SURGERY Bilateral 2020   MAXIMUM ACCESS (MAS)POSTERIOR LUMBAR INTERBODY FUSION (PLIF) 1 LEVEL N/A 11/07/2014   Procedure: Lumbar Four-Five Maximum access posterior lumbar interbody fusion;  Surgeon: Maeola Harman, MD;  Location: [facility];  Service: Neurosurgery;  Laterality: N/A;  L4-5 Maximum access posterior lumbar interbody fusion   SHOULDER SURGERY     bilateral rotator cuff repairs   STAPEDECTOMY Left    ear   TUBAL LIGATION  1971   VAGINAL HYSTERECTOMY N/A 02/26/2020   Procedure: HYSTERECTOMY VAGINAL;  Surgeon: Carrington Clamp, MD;  Location: [facility];  Service: Gynecology;  Laterality: N/A;   Patient Active Problem List   Diagnosis Date Noted   Postoperative state 02/26/2020   Furuncle of vulva 12/06/2016   Bilateral carpal tunnel syndrome 11/23/2016   Neck pain 11/23/2016   Primary osteoarthritis of both first carpometacarpal joints 11/23/2016   Diabetic nephropathy (HCC) 08/18/2016   Right knee pain 01/03/2016   Asymmetrical hearing loss of both  ears 02/27/2015   Pressure ulcer 11/10/2014   Spondylolisthesis of lumbar region 11/07/2014   CKD (chronic kidney disease), stage III (HCC) 07/24/2013   Chest pain 07/23/2013   Chronic pain 07/23/2013   Fibromyalgia 07/23/2013   Leukocytosis 02/07/2013   Weakness generalized 02/07/2013   Acute encephalopathy 02/05/2013   Anemia 02/05/2013   Acute renal failure (HCC) 02/05/2013   Dehydration 09/12/2012   Diabetes mellitus (HCC) 09/12/2012   Hypertension 09/12/2012   Hypothyroidism 09/12/2012   Hyperlipidemia 09/12/2012    PCP: Daisy Floro, MD REFERRING PROVIDER: Daisy Floro, MD REFERRING DIAG:  Diagnosis  R42 (ICD-10-CM) - Dizziness and giddiness   THERAPY DIAG:  BPPV (benign  paroxysmal positional vertigo), right  Dizziness and giddiness  Other abnormalities of gait and mobility  ONSET DATE: 08/29/22 Rationale for Evaluation and Treatment: Rehabilitation  SUBJECTIVE:  SUBJECTIVE STATEMENT: Pt reports her dizziness has not been too bad. Defers checking for BPPV today as she has felt maintained. Agreeable to work on strength and balance.  Pt accompanied by:  friend drove her to therapy PERTINENT HISTORY: Chronic pain (knees, back and shoulders), HTN. From eval: The patient reports vertigo began after a sudden fall on 08/29/22 in which she hit her head falling while stepping up a sidewalk. Vertigo began 1 week after the fall and is imbalance while walking and "everything spinning" and a sensation she will fall.  She denies vertigo with bed mobility, but notes she doesn't roll b/c of her back and her shoulder.  She also fractured her nasal bone during the fall. The patient reports her husband of 61 years passed away last year.  PAIN:  Are you having pain? Yes: NPRS scale: "midway"/10 Pain location: chronic pain back, shoulder, knees Pain description: chronic *PT will monitor, no goals to follow Aggravating factors: will monitor Relieving factors: will monitor PRECAUTIONS: Fall WEIGHT BEARING RESTRICTIONS: No FALLS: Has patient fallen in last 6 months? Yes. Number of falls 1 PATIENT GOALS: feel more steady with walking  OBJECTIVE: (Measures in this section from initial evaluation unless otherwise noted) Cervical ROM:  Limited mobility throughout/ modified testing positions LOWER EXTREMITY MMT:   MMT Right eval Left eval  Hip flexion    Hip abduction    Hip adduction    Hip internal rotation    Hip external rotation    Knee flexion    Knee extension    Ankle dorsiflexion    Ankle plantarflexion    Ankle inversion    Ankle eversion    (Blank rows = not tested) BED MOBILITY:  Limited in all mobility by chronic pain/ PT assisted GAIT: Gait pattern:   slowed gait with SPC mod indep with dec'd sequencing of cane Distance walked: 100 ft VESTIBULAR ASSESSMENT: GENERAL OBSERVATION: Walks slowly into the clinic with SPC mod indep.  SYMPTOM BEHAVIOR:  Subjective history: spinning and imbalance  Non-Vestibular symptoms:  none  Type of dizziness: Spinning/Vertigo and Unsteady with head/body turns  Frequency: daily   Duration: seconds to minutes  Aggravating factors:  worse when walking and on feet moving  Relieving factors: head stationary  Progression of symptoms: worse OCULOMOTOR EXAM:  Ocular Alignment: abnormal *swelling and bruising around eyes and nose; note L eye elevated (hypertropia)  Ocular ROM: No Limitations  Spontaneous Nystagmus: absent  Gaze-Induced Nystagmus: absent  Smooth Pursuits: intact  Saccades: intact VESTIBULAR - OCULAR REFLEX:   Slow VOR: Normal and Comment: slow pace, limited ROM  Head-Impulse Test:  deferred due to guarding  POSITIONAL TESTING: Right  Dix-Hallpike: unable to tolerate due to mobility issues Left Dix-Hallpike: unable to tolerate due to mobility issues Right Roll Test: geotropic nystagmus Left Roll Test: geotropic nystagmus Right Sidelying: reports mild sensation of dizziness moving into L sidelying  Left Sidelying: Note a horizontal beating nystagmus (think horizontal geotropic nystagmus) in L sidelying  *walls move with return to sitting FUNCTIONAL TESTS:  5 times sit to stand: 09/16/22 -- 31.05 sec 10 meter walk test: 09/16/22 -- 0.37 m/s (1.21 ft/sec)  Sharlene Motts Balance Scale:     Sharlene Motts assessed 42/56 on 09/26/22  [facility] Adult PT Treatment:                                                DATE: 10/19/22 Therapeutic Exercise: Nustep L5 x 6 min LEs/UEs Step taps to 6" step R and L with UE support, dec'ing UE support as able with CGA Step tap sideways 6" step R&L Heel/toe raise on foam 2x10 Standing hip abduction 2x10 Standing glute set on step 10x3 sec Neuromuscular re-ed: Feet together eyes open  and then eyes closed with head turns x10 Feet together eyes open and then closed with head nods x10 Feet apart on foam 2x30 sec Marching on foam x10 with UE support, x10 without UE support    [facility] Adult PT Treatment:                                                DATE: 10/12/22 Neuromuscular re-ed: Reviewed HEP for balance modifying by removing head rotation there ex at thistime. Retested positional symptoms: R sidelying= positive for large amplitude upbeating, rotary nystagmus L sidelying=no nystagmus or dizziness After CRT, retested right sidelying with no nystagmus Canolith repositioning: Modified R epley using pillows behind back and holding positions longer due to severity of symptoms when lying back to the right    [facility] Adult PT Treatment:                                                DATE: 10/07/22 Therapeutic Exercise: Standing Sit<>stand x 5 reps dec'ing UE support Step taps to 4" step R and L with UE support, dec'ing UE support as able with CGA Side stepping with intermittent UE support Marching with one UE support on rail Supine Marching Bridging x 2 reps SLR x 10 reps R And L sides Sidelying Hip abduction x 12 reps R And L Upon return to sitting patient felt pulled posteriorly and noted dizziness/vertigo Neuromuscular re-ed: Single leg standing with intermittent UE Support. Retested L sidelying- no nystagmus. Able to help patient position to modify a R dix hallpike with + upbeating, rotary nystagmus for posterior canalithiasis BPPV. Canolith repositioning: R Epley's maneuver with min to mod A for help to transition to sitting position. Patient needed help to stabilize. Walked patient out to her car after procedure since she did not bring her cane in today.   PATIENT EDUCATION: Education details: handout on local tai chi (community classes at [place] and [employer] and rec) Person educated: Patient Education method: Explanation, handout,  demonstration Education comprehension: verbalized understanding and return demo  HOME EXERCISE PROGRAM:  Access Code: [email or web address] URL: [email or web address] Date: 10/12/2022 Prepared by: Margretta Ditty  Exercises - Sit to Stand with Armchair  - 1 x daily - 7 x weekly - 1 sets - 5 reps - Standing Balance with Eyes Closed  - 1 x daily - 7 x weekly - 1 sets - 2 reps - 10 seconds hold - Standing Heel Raise with Support  - 1 x daily - 7 x weekly - 1 sets - 15-20 reps - Standing Single Leg Stance with Counter Support  - 2 x daily - 7 x weekly - 1 sets - 10 reps  GOALS: Goals reviewed with patient? Yes  SHORT TERM GOALS: Target date: 10/12/22  The patient will be indep with HEP. Baseline: Did not initiate due to BPPV Goal status: MET  2.  The patient will have negative R horizontal roll test. Baseline:  + horizontal roll Goal status: MET   3.  The patient will tolerate functional testing (5 time sit to stand, Berg, gait speed) and goals to follow for LTG, as appropriate. Baseline:  PT did not have time due to treating BPPV Goal status: MET  LONG TERM GOALS: Target date: 11/09/22  The patient will be indep with HEP Baseline: to initiate after treating BPPV Goal status: MET  2.  The patient will improve 5 points on Berg balance test. Baseline: To assess for STGs. Goal status:MET  3.   The patient will improve walking speed by 0.5 ft/sec. Baseline:  To assess for STGs. Goal status: PARTIALLY MET (1.2 UP TO 1.5 FT/SEC  4.  The patient will improve 5 time sit to stand by 5 seconds. Baseline:  to assess for STGs. Goal status:Improved from 31 to 28 seconds PARTIALLY MET  ASSESSMENT: CLINICAL IMPRESSION: Pt states dizziness has been well maintained. Continued to work on hip strengthening and balance this session. Improving stability noted.   From eval: Patient is a 82 y.o. female who was seen today for physical therapy evaluation and treatment for vertigo s/p a fall  on 08/29/22. She presents with positive positional testing for R horizontal canalithiasis per geotropic nystagmus lasting < 1 minute. Vertigo is leading to imbalance with functional mobility and further increasing risk for falls. PT to address deficits to promote improved mobility.  OBJECTIVE IMPAIRMENTS: Abnormal gait, decreased activity tolerance, decreased balance, decreased mobility, difficulty walking, decreased strength, dizziness, and pain.   PLAN:  PT FREQUENCY: 1-2x/week  PT DURATION: 8 weeks  PLANNED INTERVENTIONS: Therapeutic exercises, Therapeutic activity, Neuromuscular re-education, Balance training, Gait training, Patient/Family education, Self Care, Joint mobilization, Vestibular training, Canalith repositioning, and Manual therapy  PLAN FOR NEXT SESSION: check BPPV and treat if indicated. Progress balance and mobility activities.   Dominiqua Cooner April Ma L Joselito Fieldhouse, PT 10/19/2022, 1:22 PM

## 2022-10-26 ENCOUNTER — Ambulatory Visit: Payer: Medicare Other | Admitting: Physical Therapy

## 2022-10-28 DIAGNOSIS — M25511 Pain in right shoulder: Secondary | ICD-10-CM | POA: Diagnosis not present

## 2022-11-01 ENCOUNTER — Ambulatory Visit: Payer: Medicare Other | Admitting: Physical Therapy

## 2022-11-02 DIAGNOSIS — Z794 Long term (current) use of insulin: Secondary | ICD-10-CM | POA: Diagnosis not present

## 2022-11-02 DIAGNOSIS — Z880 Allergy status to penicillin: Secondary | ICD-10-CM | POA: Diagnosis not present

## 2022-11-02 DIAGNOSIS — W19XXXA Unspecified fall, initial encounter: Secondary | ICD-10-CM | POA: Diagnosis not present

## 2022-11-02 DIAGNOSIS — Z7982 Long term (current) use of aspirin: Secondary | ICD-10-CM | POA: Diagnosis not present

## 2022-11-02 DIAGNOSIS — Z888 Allergy status to other drugs, medicaments and biological substances status: Secondary | ICD-10-CM | POA: Diagnosis not present

## 2022-11-02 DIAGNOSIS — I4510 Unspecified right bundle-branch block: Secondary | ICD-10-CM | POA: Diagnosis not present

## 2022-11-02 DIAGNOSIS — I129 Hypertensive chronic kidney disease with stage 1 through stage 4 chronic kidney disease, or unspecified chronic kidney disease: Secondary | ICD-10-CM | POA: Diagnosis not present

## 2022-11-02 DIAGNOSIS — S0101XA Laceration without foreign body of scalp, initial encounter: Secondary | ICD-10-CM | POA: Diagnosis not present

## 2022-11-02 DIAGNOSIS — R001 Bradycardia, unspecified: Secondary | ICD-10-CM | POA: Diagnosis not present

## 2022-11-02 DIAGNOSIS — M25519 Pain in unspecified shoulder: Secondary | ICD-10-CM | POA: Diagnosis not present

## 2022-11-02 DIAGNOSIS — N189 Chronic kidney disease, unspecified: Secondary | ICD-10-CM | POA: Diagnosis not present

## 2022-11-02 DIAGNOSIS — M5031 Other cervical disc degeneration,  high cervical region: Secondary | ICD-10-CM | POA: Diagnosis not present

## 2022-11-02 DIAGNOSIS — R58 Hemorrhage, not elsewhere classified: Secondary | ICD-10-CM | POA: Diagnosis not present

## 2022-11-02 DIAGNOSIS — S0990XA Unspecified injury of head, initial encounter: Secondary | ICD-10-CM | POA: Diagnosis not present

## 2022-11-02 DIAGNOSIS — G8911 Acute pain due to trauma: Secondary | ICD-10-CM | POA: Diagnosis not present

## 2022-11-02 DIAGNOSIS — R9431 Abnormal electrocardiogram [ECG] [EKG]: Secondary | ICD-10-CM | POA: Diagnosis not present

## 2022-11-02 DIAGNOSIS — Z79899 Other long term (current) drug therapy: Secondary | ICD-10-CM | POA: Diagnosis not present

## 2022-11-02 DIAGNOSIS — D649 Anemia, unspecified: Secondary | ICD-10-CM | POA: Diagnosis not present

## 2022-11-02 DIAGNOSIS — E1140 Type 2 diabetes mellitus with diabetic neuropathy, unspecified: Secondary | ICD-10-CM | POA: Diagnosis not present

## 2022-11-02 DIAGNOSIS — M1990 Unspecified osteoarthritis, unspecified site: Secondary | ICD-10-CM | POA: Diagnosis not present

## 2022-11-02 DIAGNOSIS — I959 Hypotension, unspecified: Secondary | ICD-10-CM | POA: Diagnosis not present

## 2022-11-02 DIAGNOSIS — Z881 Allergy status to other antibiotic agents status: Secondary | ICD-10-CM | POA: Diagnosis not present

## 2022-11-02 DIAGNOSIS — E1122 Type 2 diabetes mellitus with diabetic chronic kidney disease: Secondary | ICD-10-CM | POA: Diagnosis not present

## 2022-11-02 DIAGNOSIS — E039 Hypothyroidism, unspecified: Secondary | ICD-10-CM | POA: Diagnosis not present

## 2022-11-03 ENCOUNTER — Ambulatory Visit: Payer: Medicare Other | Admitting: Physical Therapy

## 2022-11-07 DIAGNOSIS — Z6841 Body Mass Index (BMI) 40.0 and over, adult: Secondary | ICD-10-CM | POA: Diagnosis not present

## 2022-11-07 DIAGNOSIS — W19XXXD Unspecified fall, subsequent encounter: Secondary | ICD-10-CM | POA: Diagnosis not present

## 2022-11-07 DIAGNOSIS — Z09 Encounter for follow-up examination after completed treatment for conditions other than malignant neoplasm: Secondary | ICD-10-CM | POA: Diagnosis not present

## 2022-11-07 DIAGNOSIS — S0101XD Laceration without foreign body of scalp, subsequent encounter: Secondary | ICD-10-CM | POA: Diagnosis not present

## 2022-11-10 DIAGNOSIS — I10 Essential (primary) hypertension: Secondary | ICD-10-CM | POA: Diagnosis not present

## 2022-11-10 DIAGNOSIS — Z79899 Other long term (current) drug therapy: Secondary | ICD-10-CM | POA: Diagnosis not present

## 2022-11-10 DIAGNOSIS — E119 Type 2 diabetes mellitus without complications: Secondary | ICD-10-CM | POA: Diagnosis not present

## 2022-11-10 DIAGNOSIS — Z01812 Encounter for preprocedural laboratory examination: Secondary | ICD-10-CM | POA: Diagnosis not present

## 2022-11-10 DIAGNOSIS — Z01818 Encounter for other preprocedural examination: Secondary | ICD-10-CM | POA: Diagnosis not present

## 2022-11-10 DIAGNOSIS — M25511 Pain in right shoulder: Secondary | ICD-10-CM | POA: Diagnosis not present

## 2022-11-16 DIAGNOSIS — S0181XA Laceration without foreign body of other part of head, initial encounter: Secondary | ICD-10-CM | POA: Diagnosis not present

## 2022-11-16 DIAGNOSIS — S0101XA Laceration without foreign body of scalp, initial encounter: Secondary | ICD-10-CM | POA: Diagnosis not present

## 2022-11-16 DIAGNOSIS — G8929 Other chronic pain: Secondary | ICD-10-CM | POA: Diagnosis not present

## 2022-11-16 DIAGNOSIS — M25511 Pain in right shoulder: Secondary | ICD-10-CM | POA: Diagnosis not present

## 2022-11-16 DIAGNOSIS — W19XXXA Unspecified fall, initial encounter: Secondary | ICD-10-CM | POA: Diagnosis not present

## 2022-11-16 DIAGNOSIS — R42 Dizziness and giddiness: Secondary | ICD-10-CM | POA: Diagnosis not present

## 2022-11-16 DIAGNOSIS — S0990XA Unspecified injury of head, initial encounter: Secondary | ICD-10-CM | POA: Diagnosis not present

## 2022-11-21 DIAGNOSIS — Z6838 Body mass index (BMI) 38.0-38.9, adult: Secondary | ICD-10-CM | POA: Diagnosis not present

## 2022-11-21 DIAGNOSIS — M5417 Radiculopathy, lumbosacral region: Secondary | ICD-10-CM | POA: Diagnosis not present

## 2022-11-21 DIAGNOSIS — M19019 Primary osteoarthritis, unspecified shoulder: Secondary | ICD-10-CM | POA: Diagnosis not present

## 2022-11-22 DIAGNOSIS — E782 Mixed hyperlipidemia: Secondary | ICD-10-CM | POA: Diagnosis not present

## 2022-11-22 DIAGNOSIS — N1832 Chronic kidney disease, stage 3b: Secondary | ICD-10-CM | POA: Diagnosis not present

## 2022-11-22 DIAGNOSIS — F324 Major depressive disorder, single episode, in partial remission: Secondary | ICD-10-CM | POA: Diagnosis not present

## 2022-11-22 DIAGNOSIS — I10 Essential (primary) hypertension: Secondary | ICD-10-CM | POA: Diagnosis not present

## 2022-11-22 DIAGNOSIS — E1140 Type 2 diabetes mellitus with diabetic neuropathy, unspecified: Secondary | ICD-10-CM | POA: Diagnosis not present

## 2022-11-22 DIAGNOSIS — E039 Hypothyroidism, unspecified: Secondary | ICD-10-CM | POA: Diagnosis not present

## 2022-11-28 DIAGNOSIS — Z6841 Body Mass Index (BMI) 40.0 and over, adult: Secondary | ICD-10-CM | POA: Diagnosis not present

## 2022-11-28 DIAGNOSIS — D510 Vitamin B12 deficiency anemia due to intrinsic factor deficiency: Secondary | ICD-10-CM | POA: Diagnosis not present

## 2022-11-28 DIAGNOSIS — E611 Iron deficiency: Secondary | ICD-10-CM | POA: Diagnosis not present

## 2022-11-28 DIAGNOSIS — R296 Repeated falls: Secondary | ICD-10-CM | POA: Diagnosis not present

## 2022-11-28 DIAGNOSIS — E559 Vitamin D deficiency, unspecified: Secondary | ICD-10-CM | POA: Diagnosis not present

## 2022-11-28 DIAGNOSIS — E1140 Type 2 diabetes mellitus with diabetic neuropathy, unspecified: Secondary | ICD-10-CM | POA: Diagnosis not present

## 2022-11-28 DIAGNOSIS — R531 Weakness: Secondary | ICD-10-CM | POA: Diagnosis not present

## 2022-11-28 DIAGNOSIS — E039 Hypothyroidism, unspecified: Secondary | ICD-10-CM | POA: Diagnosis not present

## 2022-11-28 DIAGNOSIS — E538 Deficiency of other specified B group vitamins: Secondary | ICD-10-CM | POA: Diagnosis not present

## 2022-11-28 DIAGNOSIS — E782 Mixed hyperlipidemia: Secondary | ICD-10-CM | POA: Diagnosis not present

## 2022-11-28 DIAGNOSIS — I10 Essential (primary) hypertension: Secondary | ICD-10-CM | POA: Diagnosis not present

## 2022-11-28 DIAGNOSIS — Z79899 Other long term (current) drug therapy: Secondary | ICD-10-CM | POA: Diagnosis not present

## 2022-11-30 ENCOUNTER — Ambulatory Visit: Payer: Medicare Other | Attending: Family Medicine | Admitting: Physical Therapy

## 2022-11-30 ENCOUNTER — Encounter: Payer: Self-pay | Admitting: Physical Therapy

## 2022-11-30 DIAGNOSIS — R42 Dizziness and giddiness: Secondary | ICD-10-CM | POA: Diagnosis not present

## 2022-11-30 DIAGNOSIS — R2689 Other abnormalities of gait and mobility: Secondary | ICD-10-CM | POA: Diagnosis not present

## 2022-11-30 DIAGNOSIS — H8111 Benign paroxysmal vertigo, right ear: Secondary | ICD-10-CM | POA: Diagnosis not present

## 2022-11-30 NOTE — Therapy (Signed)
OUTPATIENT PHYSICAL THERAPY VESTIBULAR TREATMENT AND RE-ASSESSMENT   Patient Name: Abigail [name] MRN: [id number] DOB:[birth year], 82 y.o., female Today's Date: 11/30/2022  END OF SESSION:  PT End of Session - 11/30/22 1422     Visit Number 8    Number of Visits 16    Date for PT Re-Evaluation 11/13/22    Authorization Type Medicare    PT Start Time 1425    PT Stop Time 1510    PT Time Calculation (min) 45 min    Activity Tolerance Patient tolerated treatment well    Behavior During Therapy WFL for tasks assessed/performed            Past Medical History:  Diagnosis Date   Arthritis    shoulder, knee,    Boils    near vaginal area   Chronic kidney disease    stage 3; low kidney function; BUN was high acc to pt   Constipation due to pain medication    Depression    Diabetes mellitus    Type 2   Family hx of colon cancer    Fibromyalgia    Frequency of urination    Frequent urination at night    GERD (gastroesophageal reflux disease)    pt denies   Heart murmur    History of bladder infections    Hyperlipidemia    Hypertension    Hypothyroidism    Itchy eyes    Obesity    Obesity    PONV (postoperative nausea and vomiting)    with stapedectomy and    Torn rotator cuff left   current   Past Surgical History:  Procedure Laterality Date   BACK SURGERY     lumbar fusion   BLADDER SUSPENSION N/A 02/26/2020   Procedure: TRANSVAGINAL TAPE (TVT) PROCEDURE;  Surgeon: Carrington Clamp, MD;  Location: [facility];  Service: Gynecology;  Laterality: N/A;   CARPAL TUNNEL RELEASE Right 06/29/2017   Procedure: RIGHT CARPAL TUNNEL RELEASE;  Surgeon: Cindee Salt, MD;  Location: [facility];  Service: Orthopedics;  Laterality: Right;   CHOLECYSTECTOMY  1971   COLONOSCOPY     CYSTOCELE REPAIR N/A 02/26/2020   Procedure: ANTERIOR REPAIR (CYSTOCELE);  Surgeon: Carrington Clamp, MD;  Location: [facility];  Service: Gynecology;   Laterality: N/A;   CYSTOSCOPY N/A 02/26/2020   Procedure: CYSTOSCOPY;  Surgeon: Carrington Clamp, MD;  Location: [facility];  Service: Gynecology;  Laterality: N/A;   DILATION AND CURETTAGE OF UTERUS     EYE SURGERY Bilateral 2020   MAXIMUM ACCESS (MAS)POSTERIOR LUMBAR INTERBODY FUSION (PLIF) 1 LEVEL N/A 11/07/2014   Procedure: Lumbar Four-Five Maximum access posterior lumbar interbody fusion;  Surgeon: Maeola Harman, MD;  Location: [facility];  Service: Neurosurgery;  Laterality: N/A;  L4-5 Maximum access posterior lumbar interbody fusion   SHOULDER SURGERY     bilateral rotator cuff repairs   STAPEDECTOMY Left    ear   TUBAL LIGATION  1971   VAGINAL HYSTERECTOMY N/A 02/26/2020   Procedure: HYSTERECTOMY VAGINAL;  Surgeon: Carrington Clamp, MD;  Location: [facility];  Service: Gynecology;  Laterality: N/A;   Patient Active Problem List   Diagnosis Date Noted   Postoperative state 02/26/2020   Furuncle of vulva 12/06/2016   Bilateral carpal tunnel syndrome 11/23/2016   Neck pain 11/23/2016   Primary osteoarthritis of both first carpometacarpal joints 11/23/2016   Diabetic nephropathy (HCC) 08/18/2016   Right knee pain 01/03/2016   Asymmetrical hearing loss  of both ears 02/27/2015   Pressure ulcer 11/10/2014   Spondylolisthesis of lumbar region 11/07/2014   CKD (chronic kidney disease), stage III (HCC) 07/24/2013   Chest pain 07/23/2013   Chronic pain 07/23/2013   Fibromyalgia 07/23/2013   Leukocytosis 02/07/2013   Weakness generalized 02/07/2013   Acute encephalopathy 02/05/2013   Anemia 02/05/2013   Acute renal failure (HCC) 02/05/2013   Dehydration 09/12/2012   Diabetes mellitus (HCC) 09/12/2012   Hypertension 09/12/2012   Hypothyroidism 09/12/2012   Hyperlipidemia 09/12/2012    PCP: Daisy Floro, MD REFERRING PROVIDER: Daisy Floro, MD REFERRING DIAG:  Diagnosis  R42 (ICD-10-CM) - Dizziness and giddiness   THERAPY DIAG:   BPPV (benign paroxysmal positional vertigo), right  Dizziness and giddiness  Other abnormalities of gait and mobility  ONSET DATE: 08/29/22 Rationale for Evaluation and Treatment: Rehabilitation  SUBJECTIVE:  SUBJECTIVE STATEMENT: Pt reports 2 falls since last visit. Pt states she hit her head during one of them. First fall happened while in the shower. Second fall happened while turning in the bathroom. Pt reports plans to get her R shoulder replacement in August.  Pt accompanied by:  friend drove her to therapy PERTINENT HISTORY: Chronic pain (knees, back and shoulders), HTN. From eval: The patient reports vertigo began after a sudden fall on 08/29/22 in which she hit her head falling while stepping up a sidewalk. Vertigo began 1 week after the fall and is imbalance while walking and "everything spinning" and a sensation she will fall.  She denies vertigo with bed mobility, but notes she doesn't roll b/c of her back and her shoulder.  She also fractured her nasal bone during the fall. The patient reports her husband of 61 years passed away last year.  PAIN:  Are you having pain? Yes: NPRS scale: "midway"/10 Pain location: chronic pain back, shoulder, knees Pain description: chronic *PT will monitor, no goals to follow Aggravating factors: will monitor Relieving factors: will monitor PRECAUTIONS: Fall WEIGHT BEARING RESTRICTIONS: No FALLS: Has patient fallen in last 6 months? Yes. Number of falls 1 PATIENT GOALS: feel more steady with walking  OBJECTIVE: (Measures in this section from initial evaluation unless otherwise noted) Cervical ROM:  Limited mobility throughout/ modified testing positions LOWER EXTREMITY MMT:   MMT Right eval Left eval  Hip flexion    Hip abduction    Hip adduction    Hip internal rotation    Hip external rotation    Knee flexion    Knee extension    Ankle dorsiflexion    Ankle plantarflexion    Ankle inversion    Ankle eversion    (Blank rows = not  tested) BED MOBILITY:  Limited in all mobility by chronic pain/ PT assisted GAIT: Gait pattern:  slowed gait with SPC mod indep with dec'd sequencing of cane Distance walked: 100 ft VESTIBULAR ASSESSMENT: GENERAL OBSERVATION: Walks slowly into the clinic with SPC mod indep.  SYMPTOM BEHAVIOR:  Subjective history: spinning and imbalance  Non-Vestibular symptoms:  none  Type of dizziness: Spinning/Vertigo and Unsteady with head/body turns  Frequency: daily   Duration: seconds to minutes  Aggravating factors:  worse when walking and on feet moving  Relieving factors: head stationary  Progression of symptoms: worse OCULOMOTOR EXAM:  Ocular Alignment: abnormal *swelling and bruising around eyes and nose; note L eye elevated (hypertropia)  Ocular ROM: No Limitations  Spontaneous Nystagmus: absent  Gaze-Induced Nystagmus: absent  Smooth Pursuits: intact  Saccades: intact VESTIBULAR - OCULAR REFLEX:   Slow VOR:  Normal and Comment: slow pace, limited ROM  Head-Impulse Test:  deferred due to guarding  POSITIONAL TESTING: Right Dix-Hallpike: unable to tolerate due to mobility issues Left Dix-Hallpike: unable to tolerate due to mobility issues Right Roll Test: geotropic nystagmus Left Roll Test: geotropic nystagmus Right Sidelying: reports mild sensation of dizziness moving into L sidelying  Left Sidelying: Note a horizontal beating nystagmus (think horizontal geotropic nystagmus) in L sidelying  *walls move with return to sitting  11/30/22 Right sidelying (-) for horizontal and posterior canalith Left sidelying (+) for horizontal canalith, (-) posterior canalith   FUNCTIONAL TESTS:  5 times sit to stand: 09/16/22 -- 31.05 sec, 11/30/22 -- 28.55 sec 10 meter walk test: 09/16/22 -- 0.37 m/s (1.21 ft/sec), 11/30/22 -- 0.4 m/s (1.33 ft/sec)  Sharlene Motts Balance Scale:     Sharlene Motts assessed 42/56 on 09/26/22, 45/56 on 11/30/22  [facility] Adult PT Treatment:                                                DATE:  11/30/22 Therapeutic Exercise: Nustep L4 x 5 min LEs Therapeutic Activity: Rechecked pt's goals Canalith Repositioning: L side appiani/cassani x1 each Haskell Riling   [facility] Adult PT Treatment:                                                DATE: 10/19/22 Therapeutic Exercise: Nustep L5 x 6 min LEs/UEs Step taps to 6" step R and L with UE support, dec'ing UE support as able with CGA Step tap sideways 6" step R&L Heel/toe raise on foam 2x10 Standing hip abduction 2x10 Standing glute set on step 10x3 sec Neuromuscular re-ed: Feet together eyes open and then eyes closed with head turns x10 Feet together eyes open and then closed with head nods x10 Feet apart on foam 2x30 sec Marching on foam x10 with UE support, x10 without UE support    [facility] Adult PT Treatment:                                                DATE: 10/12/22 Neuromuscular re-ed: Reviewed HEP for balance modifying by removing head rotation there ex at thistime. Retested positional symptoms: R sidelying= positive for large amplitude upbeating, rotary nystagmus L sidelying=no nystagmus or dizziness After CRT, retested right sidelying with no nystagmus Canolith repositioning: Modified R epley using pillows behind back and holding positions longer due to severity of symptoms when lying back to the right    PATIENT EDUCATION: Education details: handout on local tai chi (community classes at [employer] and [employer] and rec) Person educated: Patient Education method: Programmer, multimedia, handout, demonstration Education comprehension: verbalized understanding and return demo  HOME EXERCISE PROGRAM:  Access Code: [email or web address] URL: [email or web address] Date: 10/12/2022 Prepared by: Margretta Ditty  Exercises - Sit to Stand with Armchair  - 1 x daily - 7 x weekly - 1 sets - 5 reps - Standing Balance with Eyes Closed  - 1 x daily - 7 x weekly - 1 sets - 2 reps - 10 seconds hold - Standing Heel Raise with  Support  -  1 x daily - 7 x weekly - 1 sets - 15-20 reps - Standing Single Leg Stance with Counter Support  - 2 x daily - 7 x weekly - 1 sets - 10 reps  GOALS: Goals reviewed with patient? Yes  SHORT TERM GOALS: Target date: 10/12/22  The patient will be indep with HEP. Baseline: Did not initiate due to BPPV Goal status: MET  2.  The patient will have negative R horizontal roll test. Baseline:  + horizontal roll Goal status: MET   3.  The patient will tolerate functional testing (5 time sit to stand, Berg, gait speed) and goals to follow for LTG, as appropriate. Baseline:  PT did not have time due to treating BPPV Goal status: MET  LONG TERM GOALS: Target date: 11/09/22  The patient will be indep with HEP Baseline: to initiate after treating BPPV Goal status: MET  2.  The patient will improve 5 points on Berg balance test. Baseline: To assess for STGs. Goal status:MET  3.   The patient will improve walking speed by 0.5 ft/sec. Baseline:  To assess for STGs. 11/30/22: drop to 1.33 ft/sec s/p 2 falls Goal status: PARTIALLY MET (1.2 UP TO 1.5 FT/SEC)  4.  The patient will improve 5 time sit to stand by 5 seconds. Baseline:  to assess for STGs. 11/30/22: remains at 28 sec s/p 2 falls Goal status:Improved from 31 to 28 seconds PARTIALLY MET  REVISED LONG TERM GOALS: Target date: 01/11/2023  The patient will be indep with HEP for strengthening and balance Baseline: to initiate after treating BPPV Goal status: IN PROGRESS  2.  The patient will improve to at least 50/56 to demo decreased fall risk. 11/30/22: 45/56 Goal status: INITIAL  3.   The patient will improve walking speed to >/=1.7 ft/sec to demo MCID. Baseline:  To assess for STGs. 11/30/22: initially 1.2 ft sec, improved to 1.5 ft/sec, drop to 1.33 ft/sec s/p 2 falls Goal status: INITIAL  4.  The patient will improve 5 time sit to stand to </=20 sec to demo increased functional LE strength Baseline:  to assess for  STGs. 11/30/22: remains at 28 sec s/p 2 falls Goal status: INITIAL  ASSESSMENT: CLINICAL IMPRESSION: Pt returns to clinic s/p 2 falls within the last month. Pt reports no increased dizziness. Re-assessment found mild horizontal canalithiasis. Balance reassessment demos mild improvement but not to goal level to indicate decreased fall risk. Pt appears generally weak and will benefit from continued PT for conditioning especially with pt's plans for R shoulder replacement. Discussed using RW for safety at home until her balance improves and she is safe to return to no a/d.   From eval: Patient is a 82 y.o. female who was seen today for physical therapy evaluation and treatment for vertigo s/p a fall on 08/29/22. She presents with positive positional testing for R horizontal canalithiasis per geotropic nystagmus lasting < 1 minute. Vertigo is leading to imbalance with functional mobility and further increasing risk for falls. PT to address deficits to promote improved mobility.  OBJECTIVE IMPAIRMENTS: Abnormal gait, decreased activity tolerance, decreased balance, decreased mobility, difficulty walking, decreased strength, dizziness, and pain.   PLAN:  PT FREQUENCY: 2x/week  PT DURATION: 8 weeks  PLANNED INTERVENTIONS: Therapeutic exercises, Therapeutic activity, Neuromuscular re-education, Balance training, Gait training, Patient/Family education, Self Care, Joint mobilization, Vestibular training, Canalith repositioning, and Manual therapy  PLAN FOR NEXT SESSION: check BPPV and treat if indicated. General strengthening! Progress balance and mobility activities.  Andrez Lieurance April Ma L Mardel Grudzien, PT 11/30/2022, 2:22 PM

## 2022-12-08 ENCOUNTER — Ambulatory Visit: Payer: Medicare Other | Admitting: Physical Therapy

## 2022-12-08 DIAGNOSIS — R2689 Other abnormalities of gait and mobility: Secondary | ICD-10-CM

## 2022-12-08 DIAGNOSIS — R42 Dizziness and giddiness: Secondary | ICD-10-CM

## 2022-12-08 DIAGNOSIS — H8111 Benign paroxysmal vertigo, right ear: Secondary | ICD-10-CM | POA: Diagnosis not present

## 2022-12-08 NOTE — Therapy (Signed)
OUTPATIENT PHYSICAL THERAPY VESTIBULAR TREATMENT   Patient Name: Abigail [name] MRN: [id number] DOB:Oct 01, 1940, 82 y.o., female Today's Date: 12/08/2022  END OF SESSION:  PT End of Session - 12/08/22 1148     Visit Number 9    Number of Visits 24    Date for PT Re-Evaluation 01/11/23    Authorization Type Medicare    PT Start Time 1149    PT Stop Time 1230    PT Time Calculation (min) 41 min    Activity Tolerance Patient tolerated treatment well    Behavior During Therapy WFL for tasks assessed/performed            Past Medical History:  Diagnosis Date   Arthritis    shoulder, knee,    Boils    near vaginal area   Chronic kidney disease    stage 3; low kidney function; BUN was high acc to pt   Constipation due to pain medication    Depression    Diabetes mellitus    Type 2   Family hx of colon cancer    Fibromyalgia    Frequency of urination    Frequent urination at night    GERD (gastroesophageal reflux disease)    pt denies   Heart murmur    History of bladder infections    Hyperlipidemia    Hypertension    Hypothyroidism    Itchy eyes    Obesity    Obesity    PONV (postoperative nausea and vomiting)    with stapedectomy and    Torn rotator cuff left   current   Past Surgical History:  Procedure Laterality Date   BACK SURGERY     lumbar fusion   BLADDER SUSPENSION N/A 02/26/2020   Procedure: TRANSVAGINAL TAPE (TVT) PROCEDURE;  Surgeon: Carrington Clamp, MD;  Location: [facility];  Service: Gynecology;  Laterality: N/A;   CARPAL TUNNEL RELEASE Right 06/29/2017   Procedure: RIGHT CARPAL TUNNEL RELEASE;  Surgeon: Cindee Salt, MD;  Location: [facility];  Service: Orthopedics;  Laterality: Right;   CHOLECYSTECTOMY  1971   COLONOSCOPY     CYSTOCELE REPAIR N/A 02/26/2020   Procedure: ANTERIOR REPAIR (CYSTOCELE);  Surgeon: Carrington Clamp, MD;  Location: [facility];  Service: Gynecology;  Laterality: N/A;    CYSTOSCOPY N/A 02/26/2020   Procedure: CYSTOSCOPY;  Surgeon: Carrington Clamp, MD;  Location[facility];  Service: Gynecology;  Laterality: N/A;   DILATION AND CURETTAGE OF UTERUS     EYE SURGERY Bilateral 2020   MAXIMUM ACCESS (MAS)POSTERIOR LUMBAR INTERBODY FUSION (PLIF) 1 LEVEL N/A 11/07/2014   Procedure: Lumbar Four-Five Maximum access posterior lumbar interbody fusion;  Surgeon: Maeola Harman, MD;  Location: [facility];  Service: Neurosurgery;  Laterality: N/A;  L4-5 Maximum access posterior lumbar interbody fusion   SHOULDER SURGERY     bilateral rotator cuff repairs   STAPEDECTOMY Left    ear   TUBAL LIGATION  1971   VAGINAL HYSTERECTOMY N/A 02/26/2020   Procedure: HYSTERECTOMY VAGINAL;  Surgeon: Carrington Clamp, MD;  Location: [facility];  Service: Gynecology;  Laterality: N/A;   Patient Active Problem List   Diagnosis Date Noted   Postoperative state 02/26/2020   Furuncle of vulva 12/06/2016   Bilateral carpal tunnel syndrome 11/23/2016   Neck pain 11/23/2016   Primary osteoarthritis of both first carpometacarpal joints 11/23/2016   Diabetic nephropathy (HCC) 08/18/2016   Right knee pain 01/03/2016   Asymmetrical hearing loss of both  ears 02/27/2015   Pressure ulcer 11/10/2014   Spondylolisthesis of lumbar region 11/07/2014   CKD (chronic kidney disease), stage III (HCC) 07/24/2013   Chest pain 07/23/2013   Chronic pain 07/23/2013   Fibromyalgia 07/23/2013   Leukocytosis 02/07/2013   Weakness generalized 02/07/2013   Acute encephalopathy 02/05/2013   Anemia 02/05/2013   Acute renal failure (HCC) 02/05/2013   Dehydration 09/12/2012   Diabetes mellitus (HCC) 09/12/2012   Hypertension 09/12/2012   Hypothyroidism 09/12/2012   Hyperlipidemia 09/12/2012    PCP: Daisy Floro, MD REFERRING PROVIDER: Daisy Floro, MD REFERRING DIAG:  Diagnosis  R42 (ICD-10-CM) - Dizziness and giddiness   THERAPY DIAG:  No diagnosis  found.  ONSET DATE: 08/29/22 Rationale for Evaluation and Treatment: Rehabilitation  SUBJECTIVE:  SUBJECTIVE STATEMENT: Pt states she is feeling it more in her back today. Legs are already a little sore.  Pt accompanied by:  friend drove her to therapy PERTINENT HISTORY: Chronic pain (knees, back and shoulders), HTN. From eval: The patient reports vertigo began after a sudden fall on 08/29/22 in which she hit her head falling while stepping up a sidewalk. Vertigo began 1 week after the fall and is imbalance while walking and "everything spinning" and a sensation she will fall.  She denies vertigo with bed mobility, but notes she doesn't roll b/c of her back and her shoulder.  She also fractured her nasal bone during the fall. The patient reports her husband of 61 years passed away last year.  PAIN:  Are you having pain? Yes: NPRS scale: 5/10 Pain location: chronic pain back, shoulder, knees Pain description: chronic *PT will monitor, no goals to follow Aggravating factors: will monitor Relieving factors: will monitor PRECAUTIONS: Fall WEIGHT BEARING RESTRICTIONS: No FALLS: Has patient fallen in last 6 months? Yes. Number of falls 1 PATIENT GOALS: feel more steady with walking  OBJECTIVE: (Measures in this section from initial evaluation unless otherwise noted) Cervical ROM:  Limited mobility throughout/ modified testing positions LOWER EXTREMITY MMT:   MMT Right eval Left eval  Hip flexion    Hip abduction    Hip adduction    Hip internal rotation    Hip external rotation    Knee flexion    Knee extension    Ankle dorsiflexion    Ankle plantarflexion    Ankle inversion    Ankle eversion    (Blank rows = not tested) BED MOBILITY:  Limited in all mobility by chronic pain/ PT assisted GAIT: Gait pattern:  slowed gait with SPC mod indep with dec'd sequencing of cane Distance walked: 100 ft VESTIBULAR ASSESSMENT: GENERAL OBSERVATION: Walks slowly into the clinic with SPC mod  indep.  SYMPTOM BEHAVIOR:  Subjective history: spinning and imbalance  Non-Vestibular symptoms:  none  Type of dizziness: Spinning/Vertigo and Unsteady with head/body turns  Frequency: daily   Duration: seconds to minutes  Aggravating factors:  worse when walking and on feet moving  Relieving factors: head stationary  Progression of symptoms: worse OCULOMOTOR EXAM:  Ocular Alignment: abnormal *swelling and bruising around eyes and nose; note L eye elevated (hypertropia)  Ocular ROM: No Limitations  Spontaneous Nystagmus: absent  Gaze-Induced Nystagmus: absent  Smooth Pursuits: intact  Saccades: intact VESTIBULAR - OCULAR REFLEX:   Slow VOR: Normal and Comment: slow pace, limited ROM  Head-Impulse Test:  deferred due to guarding  POSITIONAL TESTING: Right Dix-Hallpike: unable to tolerate due to mobility issues Left Dix-Hallpike: unable to tolerate due to mobility issues Right Roll Test: geotropic nystagmus Left  Roll Test: geotropic nystagmus Right Sidelying: reports mild sensation of dizziness moving into L sidelying  Left Sidelying: Note a horizontal beating nystagmus (think horizontal geotropic nystagmus) in L sidelying  *walls move with return to sitting  11/30/22 Right sidelying (-) for horizontal and posterior canalith Left sidelying (+) for horizontal canalith, (-) posterior canalith   FUNCTIONAL TESTS:  5 times sit to stand: 09/16/22 -- 31.05 sec, 11/30/22 -- 28.55 sec 10 meter walk test: 09/16/22 -- 0.37 m/s (1.21 ft/sec), 11/30/22 -- 0.4 m/s (1.33 ft/sec)  Sharlene Motts Balance Scale:     Sharlene Motts assessed 42/56 on 09/26/22, 45/56 on 11/30/22  [facility] Adult PT Treatment:                                                DATE: 12/08/22 Therapeutic Exercise: Nustep L4 x 5 min LEs/UEs Supine LTR 5x10 sec Figure 4 stretch 2x30 sec Piriformis stretch x 30 sec PPT + diaphragmatic breaths x10 Bridge + diaphragmatic breaths 2x10 Standing Side step at counter 2x10 Backwards walking at counter  2x10 Glute iso press down on step 10x3 sec Heel/toe raise 2x10 Butt kicks no UE support 2x10    [facility] Adult PT Treatment:                                                DATE: 11/30/22 Therapeutic Exercise: Nustep L4 x 5 min LEs Therapeutic Activity: Rechecked pt's goals Canalith Repositioning: L side appiani/cassani x1 each Austin Miles x1   [facility] Adult PT Treatment:                                                DATE: 10/19/22 Therapeutic Exercise: Nustep L5 x 6 min LEs/UEs Step taps to 6" step R and L with UE support, dec'ing UE support as able with CGA Step tap sideways 6" step R&L Heel/toe raise on foam 2x10 Standing hip abduction 2x10 Standing glute set on step 10x3 sec Neuromuscular re-ed: Feet together eyes open and then eyes closed with head turns x10 Feet together eyes open and then closed with head nods x10 Feet apart on foam 2x30 sec Marching on foam x10 with UE support, x10 without UE support   PATIENT EDUCATION: Education details: handout on local tai chi (community classes at [place] and [employer]) Person educated: Patient Education method: Programmer, multimedia, handout, demonstration Education comprehension: verbalized understanding and return demo  HOME EXERCISE PROGRAM:  Access Code: [email or web address] URL: [email or web address] Date: 10/12/2022 Prepared by: Margretta Ditty  Exercises - Sit to Stand with Armchair  - 1 x daily - 7 x weekly - 1 sets - 5 reps - Standing Balance with Eyes Closed  - 1 x daily - 7 x weekly - 1 sets - 2 reps - 10 seconds hold - Standing Heel Raise with Support  - 1 x daily - 7 x weekly - 1 sets - 15-20 reps - Standing Single Leg Stance with Counter Support  - 2 x daily - 7 x weekly - 1 sets - 10 reps  GOALS: Goals reviewed with patient? Yes  SHORT TERM GOALS: Target  date: 10/12/22  The patient will be indep with HEP. Baseline: Did not initiate due to BPPV Goal status: MET  2.  The patient will have negative  R horizontal roll test. Baseline:  + horizontal roll Goal status: MET   3.  The patient will tolerate functional testing (5 time sit to stand, Berg, gait speed) and goals to follow for LTG, as appropriate. Baseline:  PT did not have time due to treating BPPV Goal status: MET   REVISED LONG TERM GOALS: Target date: 01/11/2023  The patient will be indep with HEP for strengthening and balance Baseline: to initiate after treating BPPV Goal status: IN PROGRESS  2.  The patient will improve to at least 50/56 to demo decreased fall risk. 11/30/22: 45/56 Goal status: INITIAL  3.   The patient will improve walking speed to >/=1.7 ft/sec to demo MCID. Baseline:  To assess for STGs. 11/30/22: initially 1.2 ft sec, improved to 1.5 ft/sec, drop to 1.33 ft/sec s/p 2 falls Goal status: INITIAL  4.  The patient will improve 5 time sit to stand to </=20 sec to demo increased functional LE strength Baseline:  to assess for STGs. 11/30/22: remains at 28 sec s/p 2 falls Goal status: INITIAL  ASSESSMENT: CLINICAL IMPRESSION: Treatment focused primarily on strengthening to improve balance and stability. Pt endorses no dizziness or vertiginous symptoms.  From eval: Patient is a 82 y.o. female who was seen today for physical therapy evaluation and treatment for vertigo s/p a fall on 08/29/22. She presents with positive positional testing for R horizontal canalithiasis per geotropic nystagmus lasting < 1 minute. Vertigo is leading to imbalance with functional mobility and further increasing risk for falls. PT to address deficits to promote improved mobility.  OBJECTIVE IMPAIRMENTS: Abnormal gait, decreased activity tolerance, decreased balance, decreased mobility, difficulty walking, decreased strength, dizziness, and pain.   PLAN:  PT FREQUENCY: 2x/week  PT DURATION: 8 weeks  PLANNED INTERVENTIONS: Therapeutic exercises, Therapeutic activity, Neuromuscular re-education, Balance training, Gait training,  Patient/Family education, Self Care, Joint mobilization, Vestibular training, Canalith repositioning, and Manual therapy  PLAN FOR NEXT SESSION: check BPPV and treat if indicated. General strengthening! Progress balance and mobility activities.   Yerick Eggebrecht April Ma L Rennie Rouch, PT 12/08/2022, 11:49 AM

## 2022-12-12 ENCOUNTER — Encounter: Payer: Self-pay | Admitting: Rehabilitative and Restorative Service Providers"

## 2022-12-12 ENCOUNTER — Ambulatory Visit: Payer: Medicare Other | Admitting: Rehabilitative and Restorative Service Providers"

## 2022-12-12 DIAGNOSIS — R2689 Other abnormalities of gait and mobility: Secondary | ICD-10-CM | POA: Diagnosis not present

## 2022-12-12 DIAGNOSIS — H8111 Benign paroxysmal vertigo, right ear: Secondary | ICD-10-CM

## 2022-12-12 DIAGNOSIS — R42 Dizziness and giddiness: Secondary | ICD-10-CM

## 2022-12-12 NOTE — Therapy (Addendum)
OUTPATIENT PHYSICAL THERAPY VESTIBULAR TREATMENT AND 10TH VISIT PROGRESS NOTE   Patient Name: Abigail [name] MRN: [id number] DOB:Sep 20, 1940, 82 y.o., female Today's Date: 12/12/2022   Physical Therapy Progress Note   Dates of Reporting Period:09/14/22 TO 12/12/22   Objective Measurements:  Sharlene Motts 45/56  Reason Skilled Services are Required: Patient had another fall, and vertigo returned. Returned to PT to continue balance training, strengthening and treatment for BPPV. PT continuing to progress mobility activities for reduced fall risk.   Thank you for the referral of this patient.  END OF SESSION:  PT End of Session - 12/12/22 1405     Visit Number 10    Number of Visits 24    Date for PT Re-Evaluation 01/11/23    Authorization Type Medicare    PT Start Time 1403    PT Stop Time 1445    PT Time Calculation (min) 42 min    Activity Tolerance Patient tolerated treatment well    Behavior During Therapy WFL for tasks assessed/performed            Past Medical History:  Diagnosis Date   Arthritis    shoulder, knee,    Boils    near vaginal area   Chronic kidney disease    stage 3; low kidney function; BUN was high acc to pt   Constipation due to pain medication    Depression    Diabetes mellitus    Type 2   Family hx of colon cancer    Fibromyalgia    Frequency of urination    Frequent urination at night    GERD (gastroesophageal reflux disease)    pt denies   Heart murmur    History of bladder infections    Hyperlipidemia    Hypertension    Hypothyroidism    Itchy eyes    Obesity    Obesity    PONV (postoperative nausea and vomiting)    with stapedectomy and    Torn rotator cuff left   current   Past Surgical History:  Procedure Laterality Date   BACK SURGERY     lumbar fusion   BLADDER SUSPENSION N/A 02/26/2020   Procedure: TRANSVAGINAL TAPE (TVT) PROCEDURE;  Surgeon: Carrington Clamp, MD;  Location: [facility];  Service: Gynecology;   Laterality: N/A;   CARPAL TUNNEL RELEASE Right 06/29/2017   Procedure: RIGHT CARPAL TUNNEL RELEASE;  Surgeon: Cindee Salt, MD;  Location: [facility];  Service: Orthopedics;  Laterality: Right;   CHOLECYSTECTOMY  1971   COLONOSCOPY     CYSTOCELE REPAIR N/A 02/26/2020   Procedure: ANTERIOR REPAIR (CYSTOCELE);  Surgeon: Carrington Clamp, MD;  Location: [facility];  Service: Gynecology;  Laterality: N/A;   CYSTOSCOPY N/A 02/26/2020   Procedure: CYSTOSCOPY;  Surgeon: Carrington Clamp, MD;  Location: [facility];  Service: Gynecology;  Laterality: N/A;   DILATION AND CURETTAGE OF UTERUS     EYE SURGERY Bilateral 2020   MAXIMUM ACCESS (MAS)POSTERIOR LUMBAR INTERBODY FUSION (PLIF) 1 LEVEL N/A 11/07/2014   Procedure: Lumbar Four-Five Maximum access posterior lumbar interbody fusion;  Surgeon: Maeola Harman, MD;  Location: [facility];  Service: Neurosurgery;  Laterality: N/A;  L4-5 Maximum access posterior lumbar interbody fusion   SHOULDER SURGERY     bilateral rotator cuff repairs   STAPEDECTOMY Left    ear   TUBAL LIGATION  1971   VAGINAL HYSTERECTOMY N/A 02/26/2020   Procedure: HYSTERECTOMY VAGINAL;  Surgeon: Carrington Clamp, MD;  Location:   SUR[facility]ERY CENTER;  Service: Gynecology;  Laterality: N/A;   Patient Active Problem List   Diagnosis Date Noted   Postoperative state 02/26/2020   Furuncle of vulva 12/06/2016   Bilateral carpal tunnel syndrome 11/23/2016   Neck pain 11/23/2016   Primary osteoarthritis of both first carpometacarpal joints 11/23/2016   Diabetic nephropathy (HCC) 08/18/2016   Right knee pain 01/03/2016   Asymmetrical hearing loss of both ears 02/27/2015   Pressure ulcer 11/10/2014   Spondylolisthesis of lumbar region 11/07/2014   CKD (chronic kidney disease), stage III (HCC) 07/24/2013   Chest pain 07/23/2013   Chronic pain 07/23/2013   Fibromyalgia 07/23/2013   Leukocytosis 02/07/2013   Weakness generalized  02/07/2013   Acute encephalopathy 02/05/2013   Anemia 02/05/2013   Acute renal failure (HCC) 02/05/2013   Dehydration 09/12/2012   Diabetes mellitus (HCC) 09/12/2012   Hypertension 09/12/2012   Hypothyroidism 09/12/2012   Hyperlipidemia 09/12/2012    PCP: Daisy Floro, MD REFERRING PROVIDER: Daisy Floro, MD REFERRING DIAG:  Diagnosis  R42 (ICD-10-CM) - Dizziness and giddiness   THERAPY DIAG:  BPPV (benign paroxysmal positional vertigo), right  Dizziness and giddiness  Other abnormalities of gait and mobility  ONSET DATE: 08/29/22 Rationale for Evaluation and Treatment: Rehabilitation  SUBJECTIVE:  SUBJECTIVE STATEMENT: Pt states she feels occasional sensations of being pulled to the side, but no real sensation of spinning or vertigo.  Pt accompanied by:  self PERTINENT HISTORY: Chronic pain (knees, back and shoulders), HTN. From eval: The patient reports vertigo began after a sudden fall on 08/29/22 in which she hit her head falling while stepping up a sidewalk. Vertigo began 1 week after the fall and is imbalance while walking and "everything spinning" and a sensation she will fall.  She denies vertigo with bed mobility, but notes she doesn't roll b/c of her back and her shoulder.  She also fractured her nasal bone during the fall. The patient reports her husband of 61 years passed away last year.  PAIN:  Are you having pain? Yes: NPRS scale: 5/10 Pain location: chronic pain back, shoulder, knees Pain description: chronic *PT will monitor, no goals to follow Aggravating factors: will monitor Relieving factors: will monitor PRECAUTIONS: Fall WEIGHT BEARING RESTRICTIONS: No FALLS: Has patient fallen in last 6 months? Yes. Number of falls 1 PATIENT GOALS: feel more steady with walking  OBJECTIVE: (Measures in this section from initial evaluation unless otherwise noted) Cervical ROM:  Limited mobility throughout/ modified testing positions LOWER EXTREMITY MMT:   MMT Right eval Left eval  Hip flexion    Hip abduction    Hip adduction    Hip internal rotation    Hip external rotation    Knee flexion    Knee extension    Ankle dorsiflexion    Ankle plantarflexion    Ankle inversion    Ankle eversion    (Blank rows = not tested) BED MOBILITY:  Limited in all mobility by chronic pain/ PT assisted GAIT: Gait pattern:  slowed gait with SPC mod indep with dec'd sequencing of cane Distance walked: 100 ft VESTIBULAR ASSESSMENT: GENERAL OBSERVATION: Walks slowly into the clinic with SPC mod indep.  SYMPTOM BEHAVIOR:  Subjective history: spinning and imbalance  Non-Vestibular symptoms:  none  Type of dizziness: Spinning/Vertigo and Unsteady with head/body turns  Frequency: daily   Duration: seconds to minutes  Aggravating factors:  worse when walking and on feet moving  Relieving factors: head stationary  Progression of symptoms: worse OCULOMOTOR EXAM:  Ocular Alignment:  abnormal *swelling and bruising around eyes and nose; note L eye elevated (hypertropia)  Ocular ROM: No Limitations  Spontaneous Nystagmus: absent  Gaze-Induced Nystagmus: absent  Smooth Pursuits: intact  Saccades: intact VESTIBULAR - OCULAR REFLEX:   Slow VOR: Normal and Comment: slow pace, limited ROM  Head-Impulse Test:  deferred due to guarding  POSITIONAL TESTING: Right Dix-Hallpike: unable to tolerate due to mobility issues Left Dix-Hallpike: unable to tolerate due to mobility issues Right Roll Test: geotropic nystagmus Left Roll Test: geotropic nystagmus Right Sidelying: reports mild sensation of dizziness moving into L sidelying  Left Sidelying: Note a horizontal beating nystagmus (think horizontal geotropic nystagmus) in L sidelying  *walls move with return to sitting  11/30/22 Right sidelying (-) for horizontal and posterior canalith Left sidelying (+) for horizontal canalith, (-) posterior canalith   FUNCTIONAL TESTS:  5 times sit to stand: 09/16/22 --  31.05 sec, 11/30/22 -- 28.55 sec 10 meter walk test: 09/16/22 -- 0.37 m/s (1.21 ft/sec), 11/30/22 -- 0.4 m/s (1.33 ft/sec)  Sharlene Motts Balance Scale:     Sharlene Motts assessed 42/56 on 09/26/22, 45/56 on 11/30/22   [facility] Adult PT Treatment:                                                DATE: 12/12/22 Therapeutic Exercise: Standing Sit <> stand x 5 reps  Neck rotation AROM at countertop Mini squats x 3 reps-- stopped due to knee pain Side stepping 10 feet x 4 reps R and L sides Backwards walking x 2 reps  Heel/toe raises x 10 reps Marching in place x 10 reps Knee flexion x 10 reps Neuromuscular re-ed: Bilateral sidelying tests negative for nystagmus or dizziness Horizontal rolling for BPPV-- patient without nystagmus viewed in room light when rolling to the L and then back to the R, however she then reported a sensation of the ceiling moving downward direction Habituation forward bend and return to sitting ---- no dizziness after 5 reps Up/up, down/down x 5 reps R and L Alternating foot taps to 4" step with intermittent UE support.  [facility] Adult PT Treatment:                                                DATE: 12/08/22 Therapeutic Exercise: Nustep L4 x 5 min LEs/UEs Supine LTR 5x10 sec Figure 4 stretch 2x30 sec Piriformis stretch x 30 sec PPT + diaphragmatic breaths x10 Bridge + diaphragmatic breaths 2x10 Standing Side step at counter 2x10 Backwards walking at counter 2x10 Glute iso press down on step 10x3 sec Heel/toe raise 2x10 Butt kicks no UE support 2x10   [facility] Adult PT Treatment:                                                DATE: 11/30/22 Therapeutic Exercise: Nustep L4 x 5 min LEs Therapeutic Activity: Rechecked pt's goals Canalith Repositioning: L side appiani/cassani x1 each Austin Miles x1  PATIENT EDUCATION: Education details: handout on local tai chi (community classes at [place] and [employer]) Person educated: Patient Education method: Programmer, multimedia, handout,  demonstration Education comprehension:  verbalized understanding and return demo  HOME EXERCISE PROGRAM:  Access Code: [email or web address] URL: [email or web address] Date: 10/12/2022 Prepared by: Margretta Ditty  Exercises - Sit to Stand with Armchair  - 1 x daily - 7 x weekly - 1 sets - 5 reps - Standing Balance with Eyes Closed  - 1 x daily - 7 x weekly - 1 sets - 2 reps - 10 seconds hold - Standing Heel Raise with Support  - 1 x daily - 7 x weekly - 1 sets - 15-20 reps - Standing Single Leg Stance with Counter Support  - 2 x daily - 7 x weekly - 1 sets - 10 reps  GOALS: Goals reviewed with patient? Yes  SHORT TERM GOALS: Target date: 10/12/22  The patient will be indep with HEP. Baseline: Did not initiate due to BPPV Goal status: MET  2.  The patient will have negative R horizontal roll test. Baseline:  + horizontal roll Goal status: MET   3.  The patient will tolerate functional testing (5 time sit to stand, Berg, gait speed) and goals to follow for LTG, as appropriate. Baseline:  PT did not have time due to treating BPPV Goal status: MET   REVISED LONG TERM GOALS: Target date: 01/11/2023  The patient will be indep with HEP for strengthening and balance Baseline: to initiate after treating BPPV Goal status: IN PROGRESS  2.  The patient will improve to at least 50/56 to demo decreased fall risk. 11/30/22: 45/56 Goal status: INITIAL  3.   The patient will improve walking speed to >/=1.7 ft/sec to demo MCID. Baseline:  To assess for STGs. 11/30/22: initially 1.2 ft sec, improved to 1.5 ft/sec, drop to 1.33 ft/sec s/p 2 falls Goal status: INITIAL  4.  The patient will improve 5 time sit to stand to </=20 sec to demo increased functional LE strength Baseline:  to assess for STGs. 11/30/22: remains at 28 sec s/p 2 falls Goal status: INITIAL  ASSESSMENT: CLINICAL IMPRESSION: The patient notes some occasional sensations of pulling to the side. Positional testing was  without nystagmus viewed in room light, however, patient did report downward movement of ceiling tiles when moving L sidelying to roll to R sidelying.  We then focused on standing strengthening activities to improve balance and stability. Pt notes fear of falling after upcoming shoulder surgery scheduled for 8/8--- we discussed potential options to go to rehab hospital post surgery as she is a high fall risk and with her R arm in a sling, her balance may feel worse. PT to continue to work on strengthening and mobility.  From eval: Patient is a 82 y.o. female who was seen today for physical therapy evaluation and treatment for vertigo s/p a fall on 08/29/22. She presents with positive positional testing for R horizontal canalithiasis per geotropic nystagmus lasting < 1 minute. Vertigo is leading to imbalance with functional mobility and further increasing risk for falls. PT to address deficits to promote improved mobility.  OBJECTIVE IMPAIRMENTS: Abnormal gait, decreased activity tolerance, decreased balance, decreased mobility, difficulty walking, decreased strength, dizziness, and pain.   PLAN:  PT FREQUENCY: 2x/week  PT DURATION: 8 weeks  PLANNED INTERVENTIONS: Therapeutic exercises, Therapeutic activity, Neuromuscular re-education, Balance training, Gait training, Patient/Family education, Self Care, Joint mobilization, Vestibular training, Canalith repositioning, and Manual therapy  PLAN FOR NEXT SESSION: Check BPPV and treat if indicated. General strengthening! Progress balance and mobility activities.   Juneau Doughman, PT 12/12/2022, 2:05 PM

## 2022-12-14 ENCOUNTER — Ambulatory Visit: Payer: Medicare Other | Admitting: Physical Therapy

## 2022-12-14 DIAGNOSIS — R42 Dizziness and giddiness: Secondary | ICD-10-CM

## 2022-12-14 DIAGNOSIS — R2689 Other abnormalities of gait and mobility: Secondary | ICD-10-CM | POA: Diagnosis not present

## 2022-12-14 DIAGNOSIS — H8111 Benign paroxysmal vertigo, right ear: Secondary | ICD-10-CM

## 2022-12-14 NOTE — Therapy (Signed)
OUTPATIENT PHYSICAL THERAPY VESTIBULAR TREATMENT AND 10TH VISIT PROGRESS NOTE   Patient Name: LOY LITTLE MRN: [id number] DOB:1940-07-03, 82 y.o., female Today's Date: 12/14/2022   Physical Therapy Progress Note   Dates of Reporting Period:09/14/22 TO 12/12/22   Objective Measurements:  Sharlene Motts 45/56  Reason Skilled Services are Required: Patient had another fall, and vertigo returned. Returned to PT to continue balance training, strengthening and treatment for BPPV. PT continuing to progress mobility activities for reduced fall risk.   Thank you for the referral of this patient.  END OF SESSION:  PT End of Session - 12/14/22 1409     Visit Number 11    Number of Visits 24    Date for PT Re-Evaluation 01/11/23    Authorization Type Medicare    PT Start Time 1407    PT Stop Time 1445    PT Time Calculation (min) 38 min    Activity Tolerance Patient tolerated treatment well    Behavior During Therapy WFL for tasks assessed/performed            Past Medical History:  Diagnosis Date   Arthritis    shoulder, knee,    Boils    near vaginal area   Chronic kidney disease    stage 3; low kidney function; BUN was high acc to pt   Constipation due to pain medication    Depression    Diabetes mellitus    Type 2   Family hx of colon cancer    Fibromyalgia    Frequency of urination    Frequent urination at night    GERD (gastroesophageal reflux disease)    pt denies   Heart murmur    History of bladder infections    Hyperlipidemia    Hypertension    Hypothyroidism    Itchy eyes    Obesity    Obesity    PONV (postoperative nausea and vomiting)    with stapedectomy and    Torn rotator cuff left   current   Past Surgical History:  Procedure Laterality Date   BACK SURGERY     lumbar fusion   BLADDER SUSPENSION N/A 02/26/2020   Procedure: TRANSVAGINAL TAPE (TVT) PROCEDURE;  Surgeon: Carrington Clamp, MD;  Location: [facility];  Service: Gynecology;   Laterality: N/A;   CARPAL TUNNEL RELEASE Right 06/29/2017   Procedure: RIGHT CARPAL TUNNEL RELEASE;  Surgeon: Cindee Salt, MD;  Location: [facility];  Service: Orthopedics;  Laterality: Right;   CHOLECYSTECTOMY  1971   COLONOSCOPY     CYSTOCELE REPAIR N/A 02/26/2020   Procedure: ANTERIOR REPAIR (CYSTOCELE);  Surgeon: Carrington Clamp, MD;  Location: [facility];  Service: Gynecology;  Laterality: N/A;   CYSTOSCOPY N/A 02/26/2020   Procedure: CYSTOSCOPY;  Surgeon: Carrington Clamp, MD;  Location: [facility];  Service: Gynecology;  Laterality: N/A;   DILATION AND CURETTAGE OF UTERUS     EYE SURGERY Bilateral 2020   MAXIMUM ACCESS (MAS)POSTERIOR LUMBAR INTERBODY FUSION (PLIF) 1 LEVEL N/A 11/07/2014   Procedure: Lumbar Four-Five Maximum access posterior lumbar interbody fusion;  Surgeon: Maeola Harman, MD;  Location: [facility];  Service: Neurosurgery;  Laterality: N/A;  L4-5 Maximum access posterior lumbar interbody fusion   SHOULDER SURGERY     bilateral rotator cuff repairs   STAPEDECTOMY Left    ear   TUBAL LIGATION  1971   VAGINAL HYSTERECTOMY N/A 02/26/2020   Procedure: HYSTERECTOMY VAGINAL;  Surgeon: Carrington Clamp, MD;  Location: [facility];  Service: Gynecology;  Laterality: N/A;   Patient Active Problem List   Diagnosis Date Noted   Postoperative state 02/26/2020   Furuncle of vulva 12/06/2016   Bilateral carpal tunnel syndrome 11/23/2016   Neck pain 11/23/2016   Primary osteoarthritis of both first carpometacarpal joints 11/23/2016   Diabetic nephropathy (HCC) 08/18/2016   Right knee pain 01/03/2016   Asymmetrical hearing loss of both ears 02/27/2015   Pressure ulcer 11/10/2014   Spondylolisthesis of lumbar region 11/07/2014   CKD (chronic kidney disease), stage III (HCC) 07/24/2013   Chest pain 07/23/2013   Chronic pain 07/23/2013   Fibromyalgia 07/23/2013   Leukocytosis 02/07/2013   Weakness generalized  02/07/2013   Acute encephalopathy 02/05/2013   Anemia 02/05/2013   Acute renal failure (HCC) 02/05/2013   Dehydration 09/12/2012   Diabetes mellitus (HCC) 09/12/2012   Hypertension 09/12/2012   Hypothyroidism 09/12/2012   Hyperlipidemia 09/12/2012    PCP: Daisy Floro, MD REFERRING PROVIDER: Daisy Floro, MD REFERRING DIAG:  Diagnosis  R42 (ICD-10-CM) - Dizziness and giddiness   THERAPY DIAG:  No diagnosis found.  ONSET DATE: 08/29/22 Rationale for Evaluation and Treatment: Rehabilitation  SUBJECTIVE:  SUBJECTIVE STATEMENT: Pt endorses no falls. Just a little soreness from last session but not too bad.  Pt accompanied by:  self PERTINENT HISTORY: Chronic pain (knees, back and shoulders), HTN. From eval: The patient reports vertigo began after a sudden fall on 08/29/22 in which she hit her head falling while stepping up a sidewalk. Vertigo began 1 week after the fall and is imbalance while walking and "everything spinning" and a sensation she will fall.  She denies vertigo with bed mobility, but notes she doesn't roll b/c of her back and her shoulder.  She also fractured her nasal bone during the fall. The patient reports her husband of 61 years passed away last year.  PAIN:  Are you having pain? Yes: NPRS scale: 5/10 Pain location: chronic pain back, shoulder, knees Pain description: chronic *PT will monitor, no goals to follow Aggravating factors: will monitor Relieving factors: will monitor PRECAUTIONS: Fall WEIGHT BEARING RESTRICTIONS: No FALLS: Has patient fallen in last 6 months? Yes. Number of falls 1 PATIENT GOALS: feel more steady with walking  OBJECTIVE: (Measures in this section from initial evaluation unless otherwise noted) Cervical ROM:  Limited mobility throughout/ modified testing positions LOWER EXTREMITY MMT:  MMT Right eval Left eval  Hip flexion    Hip abduction    Hip adduction    Hip internal rotation    Hip external rotation    Knee  flexion    Knee extension    Ankle dorsiflexion    Ankle plantarflexion    Ankle inversion    Ankle eversion    (Blank rows = not tested) BED MOBILITY:  Limited in all mobility by chronic pain/ PT assisted GAIT: Gait pattern:  slowed gait with SPC mod indep with dec'd sequencing of cane Distance walked: 100 ft VESTIBULAR ASSESSMENT: GENERAL OBSERVATION: Walks slowly into the clinic with SPC mod indep.  SYMPTOM BEHAVIOR:  Subjective history: spinning and imbalance  Non-Vestibular symptoms:  none  Type of dizziness: Spinning/Vertigo and Unsteady with head/body turns  Frequency: daily   Duration: seconds to minutes  Aggravating factors:  worse when walking and on feet moving  Relieving factors: head stationary  Progression of symptoms: worse OCULOMOTOR EXAM:  Ocular Alignment: abnormal *swelling and bruising around eyes and nose; note L eye elevated (hypertropia)  Ocular ROM: No Limitations  Spontaneous Nystagmus: absent  Gaze-Induced Nystagmus: absent  Smooth Pursuits: intact  Saccades: intact VESTIBULAR - OCULAR REFLEX:   Slow VOR: Normal and Comment: slow pace, limited ROM  Head-Impulse Test:  deferred due to guarding  POSITIONAL TESTING: Right Dix-Hallpike: unable to tolerate due to mobility issues Left Dix-Hallpike: unable to tolerate due to mobility issues Right Roll Test: geotropic nystagmus Left Roll Test: geotropic nystagmus Right Sidelying: reports mild sensation of dizziness moving into L sidelying  Left Sidelying: Note a horizontal beating nystagmus (think horizontal geotropic nystagmus) in L sidelying  *walls move with return to sitting  11/30/22 Right sidelying (-) for horizontal and posterior canalith Left sidelying (+) for horizontal canalith, (-) posterior canalith   FUNCTIONAL TESTS:  5 times sit to stand: 09/16/22 -- 31.05 sec, 11/30/22 -- 28.55 sec 10 meter walk test: 09/16/22 -- 0.37 m/s (1.21 ft/sec), 11/30/22 -- 0.4 m/s (1.33 ft/sec)  Sharlene Motts Balance Scale:      Sharlene Motts assessed 42/56 on 09/26/22, 45/56 on 11/30/22  [facility] Adult PT Treatment:                                                DATE: 12/14/22 Therapeutic Exercise: Nustep L6 x 5 min UEs/LEs Standing Heel/toe raise 2x10 Butt kick 2x10 Side step at counter x4 laps Backwards walking at counter x4 laps Forwards stepping large steps x 4 laps Stair ascent/descent x 2 laps Neuromuscular re-ed: Clock reach x8 Static standing on foam 2x30 sec Standing on foam head turns 2x30 sec Standing on foam head nods 2x30 sec Standing on foam static standing eyes closed x30 sec    [facility] Adult PT Treatment:                                                DATE: 12/12/22 Therapeutic Exercise: Standing Sit <> stand x 5 reps  Neck rotation AROM at countertop Mini squats x 3 reps-- stopped due to knee pain Side stepping 10 feet x 4 reps R and L sides Backwards walking x 2 reps  Heel/toe raises x 10 reps Marching in place x 10 reps Knee flexion x 10 reps Neuromuscular re-ed: Bilateral sidelying tests negative for nystagmus or dizziness Horizontal rolling for BPPV-- patient without nystagmus viewed in room light when rolling to the L and then back to the R, however she then reported a sensation of the ceiling moving downward direction Habituation forward bend and return to sitting ---- no dizziness after 5 reps Up/up, down/down x 5 reps R and L Alternating foot taps to 4" step with intermittent UE support.  [facility] Adult PT Treatment:                                                DATE: 12/08/22 Therapeutic Exercise: Nustep L4 x 5 min LEs/UEs Supine LTR 5x10 sec Figure 4 stretch 2x30 sec Piriformis stretch x 30 sec PPT + diaphragmatic breaths x10 Bridge + diaphragmatic breaths 2x10 Standing Side step at counter 2x10 Backwards walking at counter 2x10 Glute iso press down on step 10x3 sec Heel/toe raise 2x10 Butt kicks no UE support  2x10   [facility] Adult PT Treatment:                                                 DATE: 11/30/22 Therapeutic Exercise: Nustep L4 x 5 min LEs Therapeutic Activity: Rechecked pt's goals Canalith Repositioning: L side appiani/cassani x1 each Austin Miles x1  PATIENT EDUCATION: Education details: handout on local tai chi (community classes at [place] and [employer] and rec) Person educated: Patient Education method: Programmer, multimedia, handout, demonstration Education comprehension: verbalized understanding and return demo  HOME EXERCISE PROGRAM:  Access Code: [email or web address] URL: [email or web address] Date: 10/12/2022 Prepared by: Margretta Ditty  Exercises - Sit to Stand with Armchair  - 1 x daily - 7 x weekly - 1 sets - 5 reps - Standing Balance with Eyes Closed  - 1 x daily - 7 x weekly - 1 sets - 2 reps - 10 seconds hold - Standing Heel Raise with Support  - 1 x daily - 7 x weekly - 1 sets - 15-20 reps - Standing Single Leg Stance with Counter Support  - 2 x daily - 7 x weekly - 1 sets - 10 reps  GOALS: Goals reviewed with patient? Yes  SHORT TERM GOALS: Target date: 10/12/22  The patient will be indep with HEP. Baseline: Did not initiate due to BPPV Goal status: MET  2.  The patient will have negative R horizontal roll test. Baseline:  + horizontal roll Goal status: MET   3.  The patient will tolerate functional testing (5 time sit to stand, Berg, gait speed) and goals to follow for LTG, as appropriate. Baseline:  PT did not have time due to treating BPPV Goal status: MET   REVISED LONG TERM GOALS: Target date: 01/11/2023  The patient will be indep with HEP for strengthening and balance Baseline: to initiate after treating BPPV Goal status: IN PROGRESS  2.  The patient will improve to at least 50/56 to demo decreased fall risk. 11/30/22: 45/56 Goal status: INITIAL  3.   The patient will improve walking speed to >/=1.7 ft/sec to demo MCID. Baseline:  To assess for STGs. 11/30/22: initially 1.2 ft sec, improved to 1.5 ft/sec, drop  to 1.33 ft/sec s/p 2 falls Goal status: INITIAL  4.  The patient will improve 5 time sit to stand to </=20 sec to demo increased functional LE strength Baseline:  to assess for STGs. 11/30/22: remains at 28 sec s/p 2 falls Goal status: INITIAL  ASSESSMENT: CLINICAL IMPRESSION: Continuing to work on standing stability, strength and balance this session. Pt with greater difficulty stabilizing on her L LE. Performed balance on foam today with some minor LOBs.   From eval: Patient is a 82 y.o. female who was seen today for physical therapy evaluation and treatment for vertigo s/p a fall on 08/29/22. She presents with positive positional testing for R horizontal canalithiasis per geotropic nystagmus lasting < 1 minute. Vertigo is leading to imbalance with functional mobility and further increasing risk for falls. PT to address deficits to promote improved mobility.  OBJECTIVE IMPAIRMENTS: Abnormal gait, decreased activity tolerance, decreased balance, decreased mobility, difficulty walking, decreased strength, dizziness, and pain.   PLAN:  PT FREQUENCY: 2x/week  PT DURATION: 8 weeks  PLANNED INTERVENTIONS: Therapeutic exercises, Therapeutic activity, Neuromuscular re-education, Balance training, Gait training, Patient/Family education, Self Care, Joint mobilization, Vestibular training, Canalith repositioning, and  Manual therapy  PLAN FOR NEXT SESSION: Check BPPV and treat if indicated. General strengthening! Progress balance and mobility activities.   Coryn Mosso April Ma L Indiyah Paone, PT 12/14/2022, 2:10 PM

## 2022-12-19 ENCOUNTER — Ambulatory Visit: Payer: Medicare Other | Admitting: Rehabilitative and Restorative Service Providers"

## 2022-12-19 ENCOUNTER — Encounter: Payer: Self-pay | Admitting: Rehabilitative and Restorative Service Providers"

## 2022-12-19 DIAGNOSIS — R42 Dizziness and giddiness: Secondary | ICD-10-CM | POA: Diagnosis not present

## 2022-12-19 DIAGNOSIS — R2689 Other abnormalities of gait and mobility: Secondary | ICD-10-CM | POA: Diagnosis not present

## 2022-12-19 DIAGNOSIS — H8111 Benign paroxysmal vertigo, right ear: Secondary | ICD-10-CM | POA: Diagnosis not present

## 2022-12-19 NOTE — Therapy (Signed)
OUTPATIENT PHYSICAL THERAPY VESTIBULAR TREATMENT   Patient Name: Abigail [name] MRN: [id number] DOB:Aug 25, 1940, 82 y.o., female Today's Date: 12/19/2022  END OF SESSION:  PT End of Session - 12/19/22 1501     Visit Number 12    Number of Visits 24    Date for PT Re-Evaluation 01/11/23    Authorization Type Medicare    PT Start Time 1458    PT Stop Time 1537    PT Time Calculation (min) 39 min    Activity Tolerance Patient tolerated treatment well    Behavior During Therapy WFL for tasks assessed/performed            Past Medical History:  Diagnosis Date   Arthritis    shoulder, knee,    Boils    near vaginal area   Chronic kidney disease    stage 3; low kidney function; BUN was high acc to pt   Constipation due to pain medication    Depression    Diabetes mellitus    Type 2   Family hx of colon cancer    Fibromyalgia    Frequency of urination    Frequent urination at night    GERD (gastroesophageal reflux disease)    pt denies   Heart murmur    History of bladder infections    Hyperlipidemia    Hypertension    Hypothyroidism    Itchy eyes    Obesity    Obesity    PONV (postoperative nausea and vomiting)    with stapedectomy and    Torn rotator cuff left   current   Past Surgical History:  Procedure Laterality Date   BACK SURGERY     lumbar fusion   BLADDER SUSPENSION N/A 02/26/2020   Procedure: TRANSVAGINAL TAPE (TVT) PROCEDURE;  Surgeon: Carrington Clamp, MD;  Location: [facility];  Service: Gynecology;  Laterality: N/A;   CARPAL TUNNEL RELEASE Right 06/29/2017   Procedure: RIGHT CARPAL TUNNEL RELEASE;  Surgeon: Cindee Salt, MD;  Location: [facility];  Service: Orthopedics;  Laterality: Right;   CHOLECYSTECTOMY  1971   COLONOSCOPY     CYSTOCELE REPAIR N/A 02/26/2020   Procedure: ANTERIOR REPAIR (CYSTOCELE);  Surgeon: Carrington Clamp, MD;  Location: [facility];  Service: Gynecology;  Laterality: N/A;    CYSTOSCOPY N/A 02/26/2020   Procedure: CYSTOSCOPY;  Surgeon: Carrington Clamp, MD;  Location: [facility];  Service: Gynecology;  Laterality: N/A;   DILATION AND CURETTAGE OF UTERUS     EYE SURGERY Bilateral 2020   MAXIMUM ACCESS (MAS)POSTERIOR LUMBAR INTERBODY FUSION (PLIF) 1 LEVEL N/A 11/07/2014   Procedure: Lumbar Four-Five Maximum access posterior lumbar interbody fusion;  Surgeon: Maeola Harman, MD;  Location: [facility];  Service: Neurosurgery;  Laterality: N/A;  L4-5 Maximum access posterior lumbar interbody fusion   SHOULDER SURGERY     bilateral rotator cuff repairs   STAPEDECTOMY Left    ear   TUBAL LIGATION  1971   VAGINAL HYSTERECTOMY N/A 02/26/2020   Procedure: HYSTERECTOMY VAGINAL;  Surgeon: Carrington Clamp, MD;  Location: [facility];  Service: Gynecology;  Laterality: N/A;   Patient Active Problem List   Diagnosis Date Noted   Postoperative state 02/26/2020   Furuncle of vulva 12/06/2016   Bilateral carpal tunnel syndrome 11/23/2016   Neck pain 11/23/2016   Primary osteoarthritis of both first carpometacarpal joints 11/23/2016   Diabetic nephropathy (HCC) 08/18/2016   Right knee pain 01/03/2016   Asymmetrical hearing loss of both  ears 02/27/2015   Pressure ulcer 11/10/2014   Spondylolisthesis of lumbar region 11/07/2014   CKD (chronic kidney disease), stage III (HCC) 07/24/2013   Chest pain 07/23/2013   Chronic pain 07/23/2013   Fibromyalgia 07/23/2013   Leukocytosis 02/07/2013   Weakness generalized 02/07/2013   Acute encephalopathy 02/05/2013   Anemia 02/05/2013   Acute renal failure (HCC) 02/05/2013   Dehydration 09/12/2012   Diabetes mellitus (HCC) 09/12/2012   Hypertension 09/12/2012   Hypothyroidism 09/12/2012   Hyperlipidemia 09/12/2012    PCP: Daisy Floro, MD REFERRING PROVIDER: Daisy Floro, MD REFERRING DIAG:  Diagnosis  R42 (ICD-10-CM) - Dizziness and giddiness   THERAPY DIAG:  Other abnormalities  of gait and mobility  Dizziness and giddiness  ONSET DATE: 08/29/22 Rationale for Evaluation and Treatment: Rehabilitation  SUBJECTIVE:  SUBJECTIVE STATEMENT: Pt reports she is tired today-- noting she is feeling bad. Her shoulder hurts more today (R shoulder-- she is due for surgery 8/8).  Her neck has been sore since last session-- thinks the head turns for balance were painful.  Pt accompanied by:  self PERTINENT HISTORY: Chronic pain (knees, back and shoulders), HTN. From eval: The patient reports vertigo began after a sudden fall on 08/29/22 in which she hit her head falling while stepping up a sidewalk. Vertigo began 1 week after the fall and is imbalance while walking and "everything spinning" and a sensation she will fall.  She denies vertigo with bed mobility, but notes she doesn't roll b/c of her back and her shoulder.  She also fractured her nasal bone during the fall. The patient reports her husband of 61 years passed away last year.  PAIN:  Are you having pain? Yes: NPRS scale: 5/10 Pain location: chronic pain back, shoulder, knees Pain description: chronic *PT will monitor, no goals to follow Aggravating factors: will monitor Relieving factors: will monitor PRECAUTIONS: Fall WEIGHT BEARING RESTRICTIONS: No FALLS: Has patient fallen in last 6 months? Yes. Number of falls 1 PATIENT GOALS: feel more steady with walking  OBJECTIVE: (Measures in this section from initial evaluation unless otherwise noted) Cervical ROM:  Limited mobility throughout/ modified testing positions LOWER EXTREMITY MMT:  MMT Right eval Left eval  Hip flexion    Hip abduction    Hip adduction    Hip internal rotation    Hip external rotation    Knee flexion    Knee extension    Ankle dorsiflexion    Ankle plantarflexion    Ankle inversion    Ankle eversion    (Blank rows = not tested) BED MOBILITY:  Limited in all mobility by chronic pain/ PT assisted GAIT: Gait pattern:  slowed gait with SPC  mod indep with dec'd sequencing of cane Distance walked: 100 ft VESTIBULAR ASSESSMENT: GENERAL OBSERVATION: Walks slowly into the clinic with SPC mod indep.  SYMPTOM BEHAVIOR:  Subjective history: spinning and imbalance  Non-Vestibular symptoms:  none  Type of dizziness: Spinning/Vertigo and Unsteady with head/body turns  Frequency: daily   Duration: seconds to minutes  Aggravating factors:  worse when walking and on feet moving  Relieving factors: head stationary  Progression of symptoms: worse OCULOMOTOR EXAM:  Ocular Alignment: abnormal *swelling and bruising around eyes and nose; note L eye elevated (hypertropia)  Ocular ROM: No Limitations  Spontaneous Nystagmus: absent  Gaze-Induced Nystagmus: absent  Smooth Pursuits: intact  Saccades: intact VESTIBULAR - OCULAR REFLEX:   Slow VOR: Normal and Comment: slow pace, limited ROM  Head-Impulse Test:  deferred due to guarding  POSITIONAL TESTING: Right Dix-Hallpike: unable to tolerate due to mobility issues Left Dix-Hallpike: unable to tolerate due to mobility issues Right Roll Test: geotropic nystagmus Left Roll Test: geotropic nystagmus Right Sidelying: reports mild sensation of dizziness moving into L sidelying  Left Sidelying: Note a horizontal beating nystagmus (think horizontal geotropic nystagmus) in L sidelying  *walls move with return to sitting  FUNCTIONAL TESTS:  5 times sit to stand: 09/16/22 -- 31.05 sec, 11/30/22 -- 28.55 sec 10 meter walk test: 09/16/22 -- 0.37 m/s (1.21 ft/sec), 11/30/22 -- 0.4 m/s (1.33 ft/sec)  Sharlene Motts Balance Scale:     Sharlene Motts assessed 42/56 on 09/26/22, 45/56 on 11/30/22  [facility] Adult PT Treatment:                                                DATE: 12/19/22 Therapeutic Exercise: Standing Sit<>stand x 5 reps Sidestepping for hip abduction 10 feet x 6 reps R and L Heel/toe raises without UE support x 10 reps Step ups onto 6" step R and L x 5 reps each with bilat handrails Neuromuscular re-ed: Compliant  surface standing with lateral step downs and posterior steps downs with UE support Gait: With SPC with cues for sequencing x 420 feet, 160 feet x 2 reps trialing with cane in R and then L hands Cues provided included auditory, tactile; patient has difficulty with sequencing Stairs with one handrail and reciprocal pattern x 6 steps (2 steps x 3 reps)   [facility] Adult PT Treatment:                                                DATE: 12/14/22 Therapeutic Exercise: Nustep L6 x 5 min UEs/LEs Standing Heel/toe raise 2x10 Butt kick 2x10 Side step at counter x4 laps Backwards walking at counter x4 laps Forwards stepping large steps x 4 laps Stair ascent/descent x 2 laps Neuromuscular re-ed: Clock reach x8 Static standing on foam 2x30 sec Standing on foam head turns 2x30 sec Standing on foam head nods 2x30 sec Standing on foam static standing eyes closed x30 sec    [facility] Adult PT Treatment:                                                DATE: 12/12/22 Therapeutic Exercise: Standing Sit <> stand x 5 reps  Neck rotation AROM at countertop Mini squats x 3 reps-- stopped due to knee pain Side stepping 10 feet x 4 reps R and L sides Backwards walking x 2 reps  Heel/toe raises x 10 reps Marching in place x 10 reps Knee flexion x 10 reps Neuromuscular re-ed: Bilateral sidelying tests negative for nystagmus or dizziness Horizontal rolling for BPPV-- patient without nystagmus viewed in room light when rolling to the L and then back to the R, however she then reported a sensation of the ceiling moving downward direction Habituation forward bend and return to sitting ---- no dizziness after 5 reps Up/up, down/down x 5 reps R and L Alternating foot taps to 4" step with intermittent UE support.  PATIENT EDUCATION: Education details: handout on local tai  chi (community classes at [employer] and [employer] parks and rec) Person educated: Patient Education method: Explanation, handout,  demonstration Education comprehension: verbalized understanding and return demo  HOME EXERCISE PROGRAM:  Access Code: [email or web address] URL: [email or web address] Date: 10/12/2022 Prepared by: Margretta Ditty  Exercises - Sit to Stand with Armchair  - 1 x daily - 7 x weekly - 1 sets - 5 reps - Standing Balance with Eyes Closed  - 1 x daily - 7 x weekly - 1 sets - 2 reps - 10 seconds hold - Standing Heel Raise with Support  - 1 x daily - 7 x weekly - 1 sets - 15-20 reps - Standing Single Leg Stance with Counter Support  - 2 x daily - 7 x weekly - 1 sets - 10 reps  GOALS: Goals reviewed with patient? Yes  SHORT TERM GOALS: Target date: 10/12/22  The patient will be indep with HEP. Baseline: Did not initiate due to BPPV Goal status: MET  2.  The patient will have negative R horizontal roll test. Baseline:  + horizontal roll Goal status: MET   3.  The patient will tolerate functional testing (5 time sit to stand, Berg, gait speed) and goals to follow for LTG, as appropriate. Baseline:  PT did not have time due to treating BPPV Goal status: MET   REVISED LONG TERM GOALS: Target date: 01/11/2023  The patient will be indep with HEP for strengthening and balance Baseline: to initiate after treating BPPV Goal status: IN PROGRESS  2.  The patient will improve to at least 50/56 to demo decreased fall risk. 11/30/22: 45/56 Goal status: INITIAL  3.   The patient will improve walking speed to >/=1.7 ft/sec to demo MCID. Baseline:  To assess for STGs. 11/30/22: initially 1.2 ft sec, improved to 1.5 ft/sec, drop to 1.33 ft/sec s/p 2 falls Goal status: INITIAL  4.  The patient will improve 5 time sit to stand to </=20 sec to demo increased functional LE strength Baseline:  to assess for STGs. 11/30/22: remains at 28 sec s/p 2 falls Goal status: INITIAL  ASSESSMENT: CLINICAL IMPRESSION: Patient notes R shoulder painful today. With gait, she switches hands with the cane and has  variable use of the cane (carries it and at times kicks it). PT focused on safer gait pattern and we discussed working to use it in the L hand due to upcoming R shoulder surgery. Patient needs assistance with sequencing and cane placement.   From eval: Patient is a 82 y.o. female who was seen today for physical therapy evaluation and treatment for vertigo s/p a fall on 08/29/22. She presents with positive positional testing for R horizontal canalithiasis per geotropic nystagmus lasting < 1 minute. Vertigo is leading to imbalance with functional mobility and further increasing risk for falls. PT to address deficits to promote improved mobility.  OBJECTIVE IMPAIRMENTS: Abnormal gait, decreased activity tolerance, decreased balance, decreased mobility, difficulty walking, decreased strength, dizziness, and pain.   PLAN:  PT FREQUENCY: 2x/week  PT DURATION: 8 weeks  PLANNED INTERVENTIONS: Therapeutic exercises, Therapeutic activity, Neuromuscular re-education, Balance training, Gait training, Patient/Family education, Self Care, Joint mobilization, Vestibular training, Canalith repositioning, and Manual therapy  PLAN FOR NEXT SESSION: General strengthening! Progress balance and mobility activities. Work on consistent use of SPC in L UE (scheduled for R shoulder surgery).    Gerardo Territo, PT 12/19/2022, 3:02 PM

## 2022-12-21 ENCOUNTER — Ambulatory Visit: Payer: Medicare Other | Admitting: Physical Therapy

## 2022-12-21 DIAGNOSIS — R2689 Other abnormalities of gait and mobility: Secondary | ICD-10-CM

## 2022-12-21 DIAGNOSIS — R42 Dizziness and giddiness: Secondary | ICD-10-CM | POA: Diagnosis not present

## 2022-12-21 DIAGNOSIS — H8111 Benign paroxysmal vertigo, right ear: Secondary | ICD-10-CM

## 2022-12-21 NOTE — Therapy (Signed)
OUTPATIENT PHYSICAL THERAPY VESTIBULAR TREATMENT   Patient Name: Abigail [name] MRN: [id number] DOB:September 19, 1940, 82 y.o., female Today's Date: 12/21/2022  END OF SESSION:  PT End of Session - 12/21/22 1409     Visit Number 13    Number of Visits 24    Date for PT Re-Evaluation 01/11/23    Authorization Type Medicare    PT Start Time 1403    PT Stop Time 1445    PT Time Calculation (min) 42 min    Activity Tolerance Patient tolerated treatment well    Behavior During Therapy WFL for tasks assessed/performed            Past Medical History:  Diagnosis Date   Arthritis    shoulder, knee,    Boils    near vaginal area   Chronic kidney disease    stage 3; low kidney function; BUN was high acc to pt   Constipation due to pain medication    Depression    Diabetes mellitus    Type 2   Family hx of colon cancer    Fibromyalgia    Frequency of urination    Frequent urination at night    GERD (gastroesophageal reflux disease)    pt denies   Heart murmur    History of bladder infections    Hyperlipidemia    Hypertension    Hypothyroidism    Itchy eyes    Obesity    Obesity    PONV (postoperative nausea and vomiting)    with stapedectomy and    Torn rotator cuff left   current   Past Surgical History:  Procedure Laterality Date   BACK SURGERY     lumbar fusion   BLADDER SUSPENSION N/A 02/26/2020   Procedure: TRANSVAGINAL TAPE (TVT) PROCEDURE;  Surgeon: Carrington Clamp, MD;  Location: [facility] [place];  Service: Gynecology;  Laterality: N/A;   CARPAL TUNNEL RELEASE Right 06/29/2017   Procedure: RIGHT CARPAL TUNNEL RELEASE;  Surgeon: Cindee Salt, MD;  Location: [facility];  Service: Orthopedics;  Laterality: Right;   CHOLECYSTECTOMY  1971   COLONOSCOPY     CYSTOCELE REPAIR N/A 02/26/2020   Procedure: ANTERIOR REPAIR (CYSTOCELE);  Surgeon: Carrington Clamp, MD;  Location: [facility];  Service: Gynecology;  Laterality: N/A;    CYSTOSCOPY N/A 02/26/2020   Procedure: CYSTOSCOPY;  Surgeon: Carrington Clamp, MD;  Location: [facility];  Service: Gynecology;  Laterality: N/A;   DILATION AND CURETTAGE OF UTERUS     EYE SURGERY Bilateral 2020   MAXIMUM ACCESS (MAS)POSTERIOR LUMBAR INTERBODY FUSION (PLIF) 1 LEVEL N/A 11/07/2014   Procedure: Lumbar Four-Five Maximum access posterior lumbar interbody fusion;  Surgeon: Maeola Harman, MD;  Location: [facility];  Service: Neurosurgery;  Laterality: N/A;  L4-5 Maximum access posterior lumbar interbody fusion   SHOULDER SURGERY     bilateral rotator cuff repairs   STAPEDECTOMY Left    ear   TUBAL LIGATION  1971   VAGINAL HYSTERECTOMY N/A 02/26/2020   Procedure: HYSTERECTOMY VAGINAL;  Surgeon: Carrington Clamp, MD;  Location: [facility];  Service: Gynecology;  Laterality: N/A;   Patient Active Problem List   Diagnosis Date Noted   Postoperative state 02/26/2020   Furuncle of vulva 12/06/2016   Bilateral carpal tunnel syndrome 11/23/2016   Neck pain 11/23/2016   Primary osteoarthritis of both first carpometacarpal joints 11/23/2016   Diabetic nephropathy (HCC) 08/18/2016   Right knee pain 01/03/2016   Asymmetrical hearing loss of both  ears 02/27/2015   Pressure ulcer 11/10/2014   Spondylolisthesis of lumbar region 11/07/2014   CKD (chronic kidney disease), stage III (HCC) 07/24/2013   Chest pain 07/23/2013   Chronic pain 07/23/2013   Fibromyalgia 07/23/2013   Leukocytosis 02/07/2013   Weakness generalized 02/07/2013   Acute encephalopathy 02/05/2013   Anemia 02/05/2013   Acute renal failure (HCC) 02/05/2013   Dehydration 09/12/2012   Diabetes mellitus (HCC) 09/12/2012   Hypertension 09/12/2012   Hypothyroidism 09/12/2012   Hyperlipidemia 09/12/2012    PCP: Daisy Floro, MD REFERRING PROVIDER: Daisy Floro, MD REFERRING DIAG:  Diagnosis  R42 (ICD-10-CM) - Dizziness and giddiness   THERAPY DIAG:  No diagnosis  found.  ONSET DATE: 08/29/22 Rationale for Evaluation and Treatment: Rehabilitation  SUBJECTIVE:  SUBJECTIVE STATEMENT: Pt states she is tired today. No falls at home Pt accompanied by:  self PERTINENT HISTORY: Chronic pain (knees, back and shoulders), HTN. From eval: The patient reports vertigo began after a sudden fall on 08/29/22 in which she hit her head falling while stepping up a sidewalk. Vertigo began 1 week after the fall and is imbalance while walking and "everything spinning" and a sensation she will fall.  She denies vertigo with bed mobility, but notes she doesn't roll b/c of her back and her shoulder.  She also fractured her nasal bone during the fall. The patient reports her husband of 61 years passed away last year.  PAIN:  Are you having pain? Yes: NPRS scale: 5/10 Pain location: chronic pain back, shoulder, knees Pain description: chronic *PT will monitor, no goals to follow Aggravating factors: will monitor Relieving factors: will monitor PRECAUTIONS: Fall WEIGHT BEARING RESTRICTIONS: No FALLS: Has patient fallen in last 6 months? Yes. Number of falls 1 PATIENT GOALS: feel more steady with walking  OBJECTIVE: (Measures in this section from initial evaluation unless otherwise noted) Cervical ROM:  Limited mobility throughout/ modified testing positions LOWER EXTREMITY MMT:  MMT Right eval Left eval  Hip flexion    Hip abduction    Hip adduction    Hip internal rotation    Hip external rotation    Knee flexion    Knee extension    Ankle dorsiflexion    Ankle plantarflexion    Ankle inversion    Ankle eversion    (Blank rows = not tested) BED MOBILITY:  Limited in all mobility by chronic pain/ PT assisted GAIT: Gait pattern:  slowed gait with SPC mod indep with dec'd sequencing of cane Distance walked: 100 ft VESTIBULAR ASSESSMENT: GENERAL OBSERVATION: Walks slowly into the clinic with SPC mod indep.  SYMPTOM BEHAVIOR:  Subjective history: spinning and  imbalance  Non-Vestibular symptoms:  none  Type of dizziness: Spinning/Vertigo and Unsteady with head/body turns  Frequency: daily   Duration: seconds to minutes  Aggravating factors:  worse when walking and on feet moving  Relieving factors: head stationary  Progression of symptoms: worse OCULOMOTOR EXAM:  Ocular Alignment: abnormal *swelling and bruising around eyes and nose; note L eye elevated (hypertropia)  Ocular ROM: No Limitations  Spontaneous Nystagmus: absent  Gaze-Induced Nystagmus: absent  Smooth Pursuits: intact  Saccades: intact VESTIBULAR - OCULAR REFLEX:   Slow VOR: Normal and Comment: slow pace, limited ROM  Head-Impulse Test:  deferred due to guarding  POSITIONAL TESTING: Right Dix-Hallpike: unable to tolerate due to mobility issues Left Dix-Hallpike: unable to tolerate due to mobility issues Right Roll Test: geotropic nystagmus Left Roll Test: geotropic nystagmus Right Sidelying: reports mild sensation of dizziness moving into  L sidelying  Left Sidelying: Note a horizontal beating nystagmus (think horizontal geotropic nystagmus) in L sidelying  *walls move with return to sitting  FUNCTIONAL TESTS:  5 times sit to stand: 09/16/22 -- 31.05 sec, 11/30/22 -- 28.55 sec 10 meter walk test: 09/16/22 -- 0.37 m/s (1.21 ft/sec), 11/30/22 -- 0.4 m/s (1.33 ft/sec)  Sharlene Motts Balance Scale:     Sharlene Motts assessed 42/56 on 09/26/22, 45/56 on 11/30/22  [facility] Adult PT Treatment:                                                DATE: 12/21/22 Therapeutic Exercise: Standing Hip abduction 2x10 Hip ext 2x10 Heel/toe raises without UE support x 10 reps Supine Marching x10 Hip/knees 90/90 2x20 sec Bridge 2x10 Neuromuscular re-ed: Tandem stance 2x30 sec R&L Therapeutic Activity: Supine<>sit transfer x2 min A Seated BP 101/57 HR 65 Supine after exercises BP 145/71 HR 55 Seated after session 126/72   [facility] Adult PT Treatment:                                                DATE:  12/19/22 Therapeutic Exercise: Standing Sit<>stand x 5 reps Sidestepping for hip abduction 10 feet x 6 reps R and L Heel/toe raises without UE support x 10 reps Step ups onto 6" step R and L x 5 reps each with bilat handrails Neuromuscular re-ed: Compliant surface standing with lateral step downs and posterior steps downs with UE support Gait: With SPC with cues for sequencing x 420 feet, 160 feet x 2 reps trialing with cane in R and then L hands Cues provided included auditory, tactile; patient has difficulty with sequencing Stairs with one handrail and reciprocal pattern x 6 steps (2 steps x 3 reps)   [facility] Adult PT Treatment:                                                DATE: 12/14/22 Therapeutic Exercise: Nustep L6 x 5 min UEs/LEs Standing Heel/toe raise 2x10 Butt kick 2x10 Side step at counter x4 laps Backwards walking at counter x4 laps Forwards stepping large steps x 4 laps Stair ascent/descent x 2 laps Neuromuscular re-ed: Clock reach x8 Static standing on foam 2x30 sec Standing on foam head turns 2x30 sec Standing on foam head nods 2x30 sec Standing on foam static standing eyes closed x30 sec   PATIENT EDUCATION: Education details: handout on local tai chi (community classes at [place] and [employer]) Person educated: Patient Education method: Programmer, multimedia, handout, demonstration Education comprehension: verbalized understanding and return demo  HOME EXERCISE PROGRAM:  Access Code: [email or web address] URL: [email or web address] Date: 10/12/2022 Prepared by: Margretta Ditty  Exercises - Sit to Stand with Armchair  - 1 x daily - 7 x weekly - 1 sets - 5 reps - Standing Balance with Eyes Closed  - 1 x daily - 7 x weekly - 1 sets - 2 reps - 10 seconds hold - Standing Heel Raise with Support  - 1 x daily - 7 x weekly - 1 sets - 15-20 reps - Standing Single Leg Stance with  Counter Support  - 2 x daily - 7 x weekly - 1 sets - 10 reps  GOALS: Goals  reviewed with patient? Yes  SHORT TERM GOALS: Target date: 10/12/22  The patient will be indep with HEP. Baseline: Did not initiate due to BPPV Goal status: MET  2.  The patient will have negative R horizontal roll test. Baseline:  + horizontal roll Goal status: MET   3.  The patient will tolerate functional testing (5 time sit to stand, Berg, gait speed) and goals to follow for LTG, as appropriate. Baseline:  PT did not have time due to treating BPPV Goal status: MET   REVISED LONG TERM GOALS: Target date: 01/11/2023  The patient will be indep with HEP for strengthening and balance Baseline: to initiate after treating BPPV Goal status: IN PROGRESS  2.  The patient will improve to at least 50/56 to demo decreased fall risk. 11/30/22: 45/56 Goal status: INITIAL  3.   The patient will improve walking speed to >/=1.7 ft/sec to demo MCID. Baseline:  To assess for STGs. 11/30/22: initially 1.2 ft sec, improved to 1.5 ft/sec, drop to 1.33 ft/sec s/p 2 falls Goal status: INITIAL  4.  The patient will improve 5 time sit to stand to </=20 sec to demo increased functional LE strength Baseline:  to assess for STGs. 11/30/22: remains at 28 sec s/p 2 falls Goal status: INITIAL  ASSESSMENT: CLINICAL IMPRESSION: Pt reports not feeling quite right today while performing standing and balance exercises. Found pt's BP to be lower than her norm. Performed rest of exercises in supine for pt safety working on core and hip strengthening. Continues to require min A for bed transfers due to R shoulder. BP stable by end of session; however, pt with minor LOBs requiring min A.   From eval: Patient is a 82 y.o. female who was seen today for physical therapy evaluation and treatment for vertigo s/p a fall on 08/29/22. She presents with positive positional testing for R horizontal canalithiasis per geotropic nystagmus lasting < 1 minute. Vertigo is leading to imbalance with functional mobility and further  increasing risk for falls. PT to address deficits to promote improved mobility.  OBJECTIVE IMPAIRMENTS: Abnormal gait, decreased activity tolerance, decreased balance, decreased mobility, difficulty walking, decreased strength, dizziness, and pain.   PLAN:  PT FREQUENCY: 2x/week  PT DURATION: 8 weeks  PLANNED INTERVENTIONS: Therapeutic exercises, Therapeutic activity, Neuromuscular re-education, Balance training, Gait training, Patient/Family education, Self Care, Joint mobilization, Vestibular training, Canalith repositioning, and Manual therapy  PLAN FOR NEXT SESSION: General strengthening! Progress balance and mobility activities. Work on consistent use of SPC in L UE (scheduled for R shoulder surgery).    [place] April Ma L Kortlyn Koltz, PT 12/21/2022, 2:09 PM

## 2022-12-27 ENCOUNTER — Ambulatory Visit: Payer: Medicare Other | Admitting: Physical Therapy

## 2022-12-27 DIAGNOSIS — R2689 Other abnormalities of gait and mobility: Secondary | ICD-10-CM | POA: Diagnosis not present

## 2022-12-27 DIAGNOSIS — R42 Dizziness and giddiness: Secondary | ICD-10-CM

## 2022-12-27 DIAGNOSIS — H8111 Benign paroxysmal vertigo, right ear: Secondary | ICD-10-CM

## 2022-12-27 NOTE — Therapy (Addendum)
 OUTPATIENT PHYSICAL THERAPY VESTIBULAR TREATMENT AND DISCHARGE SUMMARY  Patient Name: Abigail [name] MRN: [id number] DOB:September 13, 1940, 82 y.o., female Today's Date: 12/27/2022   Patient did not return to PT.  Please refer to the below note for last known patient status. Thank you for the referral of this patient. Margretta Ditty, MPT   END OF SESSION:  PT End of Session - 12/27/22 1357     Visit Number 14    Number of Visits 24    Date for PT Re-Evaluation 01/11/23    Authorization Type Medicare    PT Start Time 1400    PT Stop Time 1440    PT Time Calculation (min) 40 min    Activity Tolerance Patient tolerated treatment well    Behavior During Therapy WFL for tasks assessed/performed            Past Medical History:  Diagnosis Date   Arthritis    shoulder, knee,    Boils    near vaginal area   Chronic kidney disease    stage 3; low kidney function; BUN was high acc to pt   Constipation due to pain medication    Depression    Diabetes mellitus    Type 2   Family hx of colon cancer    Fibromyalgia    Frequency of urination    Frequent urination at night    GERD (gastroesophageal reflux disease)    pt denies   Heart murmur    History of bladder infections    Hyperlipidemia    Hypertension    Hypothyroidism    Itchy eyes    Obesity    Obesity    PONV (postoperative nausea and vomiting)    with stapedectomy and    Torn rotator cuff left   current   Past Surgical History:  Procedure Laterality Date   BACK SURGERY     lumbar fusion   BLADDER SUSPENSION N/A 02/26/2020   Procedure: TRANSVAGINAL TAPE (TVT) PROCEDURE;  Surgeon: Carrington Clamp, MD;  Location: [facility];  Service: Gynecology;  Laterality: N/A;   CARPAL TUNNEL RELEASE Right 06/29/2017   Procedure: RIGHT CARPAL TUNNEL RELEASE;  Surgeon: Cindee Salt, MD;  Location: [facility];  Service: Orthopedics;  Laterality: Right;   CHOLECYSTECTOMY  1971   COLONOSCOPY      CYSTOCELE REPAIR N/A 02/26/2020   Procedure: ANTERIOR REPAIR (CYSTOCELE);  Surgeon: Carrington Clamp, MD;  Location: [facility];  Service: Gynecology;  Laterality: N/A;   CYSTOSCOPY N/A 02/26/2020   Procedure: CYSTOSCOPY;  Surgeon: Carrington Clamp, MD;  Location: [facility];  Service: Gynecology;  Laterality: N/A;   DILATION AND CURETTAGE OF UTERUS     EYE SURGERY Bilateral 2020   MAXIMUM ACCESS (MAS)POSTERIOR LUMBAR INTERBODY FUSION (PLIF) 1 LEVEL N/A 11/07/2014   Procedure: Lumbar Four-Five Maximum access posterior lumbar interbody fusion;  Surgeon: Maeola Harman, MD;  Location: [facility];  Service: Neurosurgery;  Laterality: N/A;  L4-5 Maximum access posterior lumbar interbody fusion   SHOULDER SURGERY     bilateral rotator cuff repairs   STAPEDECTOMY Left    ear   TUBAL LIGATION  1971   VAGINAL HYSTERECTOMY N/A 02/26/2020   Procedure: HYSTERECTOMY VAGINAL;  Surgeon: Carrington Clamp, MD;  Location: [facility];  Service: Gynecology;  Laterality: N/A;   Patient Active Problem List   Diagnosis Date Noted   Postoperative state 02/26/2020   Furuncle of vulva 12/06/2016   Bilateral carpal tunnel syndrome 11/23/2016  Neck pain 11/23/2016   Primary osteoarthritis of both first carpometacarpal joints 11/23/2016   Diabetic nephropathy (HCC) 08/18/2016   Right knee pain 01/03/2016   Asymmetrical hearing loss of both ears 02/27/2015   Pressure ulcer 11/10/2014   Spondylolisthesis of lumbar region 11/07/2014   CKD (chronic kidney disease), stage III (HCC) 07/24/2013   Chest pain 07/23/2013   Chronic pain 07/23/2013   Fibromyalgia 07/23/2013   Leukocytosis 02/07/2013   Weakness generalized 02/07/2013   Acute encephalopathy 02/05/2013   Anemia 02/05/2013   Acute renal failure (HCC) 02/05/2013   Dehydration 09/12/2012   Diabetes mellitus (HCC) 09/12/2012   Hypertension 09/12/2012   Hypothyroidism 09/12/2012   Hyperlipidemia 09/12/2012     PCP: Daisy Floro, MD REFERRING PROVIDER: Daisy Floro, MD REFERRING DIAG:  Diagnosis  R42 (ICD-10-CM) - Dizziness and giddiness   THERAPY DIAG:  No diagnosis found.  ONSET DATE: 08/29/22 Rationale for Evaluation and Treatment: Rehabilitation  SUBJECTIVE:  SUBJECTIVE STATEMENT: Pt states she is doing better today. Pt reports she missed her yearly doctor's appointment.  Pt accompanied by:  self PERTINENT HISTORY: Chronic pain (knees, back and shoulders), HTN. From eval: The patient reports vertigo began after a sudden fall on 08/29/22 in which she hit her head falling while stepping up a sidewalk. Vertigo began 1 week after the fall and is imbalance while walking and "everything spinning" and a sensation she will fall.  She denies vertigo with bed mobility, but notes she doesn't roll b/c of her back and her shoulder.  She also fractured her nasal bone during the fall. The patient reports her husband of 61 years passed away last year.  PAIN:  Are you having pain? Yes: NPRS scale: 5/10 Pain location: R knee & R shoulder Pain description: chronic *PT will monitor, no goals to follow Aggravating factors: will monitor Relieving factors: will monitor PRECAUTIONS: Fall WEIGHT BEARING RESTRICTIONS: No FALLS: Has patient fallen in last 6 months? Yes. Number of falls 1 PATIENT GOALS: feel more steady with walking  OBJECTIVE: (Measures in this section from initial evaluation unless otherwise noted) Cervical ROM:  Limited mobility throughout/ modified testing positions LOWER EXTREMITY MMT:  MMT Right eval Left eval  Hip flexion    Hip abduction    Hip adduction    Hip internal rotation    Hip external rotation    Knee flexion    Knee extension    Ankle dorsiflexion    Ankle plantarflexion    Ankle inversion    Ankle eversion    (Blank rows = not tested) BED MOBILITY:  Limited in all mobility by chronic pain/ PT assisted GAIT: Gait pattern:  slowed gait with SPC mod  indep with dec'd sequencing of cane Distance walked: 100 ft VESTIBULAR ASSESSMENT: GENERAL OBSERVATION: Walks slowly into the clinic with SPC mod indep.  SYMPTOM BEHAVIOR:  Subjective history: spinning and imbalance  Non-Vestibular symptoms:  none  Type of dizziness: Spinning/Vertigo and Unsteady with head/body turns  Frequency: daily   Duration: seconds to minutes  Aggravating factors:  worse when walking and on feet moving  Relieving factors: head stationary  Progression of symptoms: worse OCULOMOTOR EXAM:  Ocular Alignment: abnormal *swelling and bruising around eyes and nose; note L eye elevated (hypertropia)  Ocular ROM: No Limitations  Spontaneous Nystagmus: absent  Gaze-Induced Nystagmus: absent  Smooth Pursuits: intact  Saccades: intact VESTIBULAR - OCULAR REFLEX:   Slow VOR: Normal and Comment: slow pace, limited ROM  Head-Impulse Test:  deferred due to guarding  POSITIONAL  TESTING: Right Dix-Hallpike: unable to tolerate due to mobility issues Left Dix-Hallpike: unable to tolerate due to mobility issues Right Roll Test: geotropic nystagmus Left Roll Test: geotropic nystagmus Right Sidelying: reports mild sensation of dizziness moving into L sidelying  Left Sidelying: Note a horizontal beating nystagmus (think horizontal geotropic nystagmus) in L sidelying  *walls move with return to sitting  FUNCTIONAL TESTS:  5 times sit to stand: 09/16/22 -- 31.05 sec, 11/30/22 -- 28.55 sec 10 meter walk test: 09/16/22 -- 0.37 m/s (1.21 ft/sec), 11/30/22 -- 0.4 m/s (1.33 ft/sec)  Sharlene Motts Balance Scale:     Sharlene Motts assessed 42/56 on 09/26/22, 45/56 on 11/30/22  [facility] Adult PT Treatment:                                                DATE: 12/27/22 Therapeutic Exercise: Nustep L6 x 5 min UEs/LEs Mini squat 2x10 Neuromuscular re-ed: Slow Marching x10 Walking backwards 2x10 Tandem stance 2x30 sec Tandem walking 2x10' SLS with UE support 2x30 sec Alternating step tap 6" step 2x30 sec Big  staggered walking 2x20' Reach forward 2x10 Gait: Amb forward with head turns and then head nods With SPC on L with cues for sequencing x 4 laps    [facility] Adult PT Treatment:                                                DATE: 12/21/22 Therapeutic Exercise: Standing Hip abduction 2x10 Hip ext 2x10 Heel/toe raises without UE support x 10 reps Supine Marching x10 Hip/knees 90/90 2x20 sec Bridge 2x10 Neuromuscular re-ed: Tandem stance 2x30 sec R&L Therapeutic Activity: Supine<>sit transfer x2 min A Seated BP 101/57 HR 65 Supine after exercises BP 145/71 HR 55 Seated after session 126/72   [facility] Adult PT Treatment:                                                DATE: 12/19/22 Therapeutic Exercise: Standing Sit<>stand x 5 reps Sidestepping for hip abduction 10 feet x 6 reps R and L Heel/toe raises without UE support x 10 reps Step ups onto 6" step R and L x 5 reps each with bilat handrails Neuromuscular re-ed: Compliant surface standing with lateral step downs and posterior steps downs with UE support Gait: With SPC with cues for sequencing x 420 feet, 160 feet x 2 reps trialing with cane in R and then L hands Cues provided included auditory, tactile; patient has difficulty with sequencing Stairs with one handrail and reciprocal pattern x 6 steps (2 steps x 3 reps)   [facility] Adult PT Treatment:                                                DATE: 12/14/22 Therapeutic Exercise: Nustep L6 x 5 min UEs/LEs Standing Heel/toe raise 2x10 Butt kick 2x10 Side step at counter x4 laps Backwards walking at counter x4 laps Forwards stepping large steps x 4 laps Stair ascent/descent x 2  laps Neuromuscular re-ed: Clock reach x8 Static standing on foam 2x30 sec Standing on foam head turns 2x30 sec Standing on foam head nods 2x30 sec Standing on foam static standing eyes closed x30 sec   PATIENT EDUCATION: Education details: handout on local tai chi (community classes at [employer] and  [employer] and rec) Person educated: Patient Education method: Programmer, multimedia, handout, demonstration Education comprehension: verbalized understanding and return demo  HOME EXERCISE PROGRAM:  Access Code: [email or web address] URL: [email or web address] Date: 10/12/2022 Prepared by: Margretta Ditty  Exercises - Sit to Stand with Armchair  - 1 x daily - 7 x weekly - 1 sets - 5 reps - Standing Balance with Eyes Closed  - 1 x daily - 7 x weekly - 1 sets - 2 reps - 10 seconds hold - Standing Heel Raise with Support  - 1 x daily - 7 x weekly - 1 sets - 15-20 reps - Standing Single Leg Stance with Counter Support  - 2 x daily - 7 x weekly - 1 sets - 10 reps  GOALS: Goals reviewed with patient? Yes  SHORT TERM GOALS: Target date: 10/12/22  The patient will be indep with HEP. Baseline: Did not initiate due to BPPV Goal status: MET  2.  The patient will have negative R horizontal roll test. Baseline:  + horizontal roll Goal status: MET   3.  The patient will tolerate functional testing (5 time sit to stand, Berg, gait speed) and goals to follow for LTG, as appropriate. Baseline:  PT did not have time due to treating BPPV Goal status: MET   REVISED LONG TERM GOALS: Target date: 01/11/2023  The patient will be indep with HEP for strengthening and balance Baseline: to initiate after treating BPPV Goal status: IN PROGRESS  2.  The patient will improve to at least 50/56 to demo decreased fall risk. 11/30/22: 45/56 Goal status: INITIAL  3.   The patient will improve walking speed to >/=1.7 ft/sec to demo MCID. Baseline:  To assess for STGs. 11/30/22: initially 1.2 ft sec, improved to 1.5 ft/sec, drop to 1.33 ft/sec s/p 2 falls Goal status: INITIAL  4.  The patient will improve 5 time sit to stand to </=20 sec to demo increased functional LE strength Baseline:  to assess for STGs. 11/30/22: remains at 28 sec s/p 2 falls Goal status: INITIAL  ASSESSMENT: CLINICAL  IMPRESSION: Worked on static and dynamic balance today. Less LOBs today. Able to tolerate more single leg stabilization exercises. Worked on stability with gait.   From eval: Patient is a 82 y.o. female who was seen today for physical therapy evaluation and treatment for vertigo s/p a fall on 08/29/22. She presents with positive positional testing for R horizontal canalithiasis per geotropic nystagmus lasting < 1 minute. Vertigo is leading to imbalance with functional mobility and further increasing risk for falls. PT to address deficits to promote improved mobility.  OBJECTIVE IMPAIRMENTS: Abnormal gait, decreased activity tolerance, decreased balance, decreased mobility, difficulty walking, decreased strength, dizziness, and pain.   PLAN:  PT FREQUENCY: 2x/week  PT DURATION: 8 weeks  PLANNED INTERVENTIONS: Therapeutic exercises, Therapeutic activity, Neuromuscular re-education, Balance training, Gait training, Patient/Family education, Self Care, Joint mobilization, Vestibular training, Canalith repositioning, and Manual therapy  PLAN FOR NEXT SESSION: General strengthening! Progress balance and mobility activities. Work on consistent use of SPC in L UE (scheduled for R shoulder surgery).    Chakara Bognar April Ma L Virdell Hoiland, PT 12/27/2022, 1:58 PM

## 2022-12-28 DIAGNOSIS — E871 Hypo-osmolality and hyponatremia: Secondary | ICD-10-CM | POA: Diagnosis not present

## 2022-12-28 DIAGNOSIS — Z01812 Encounter for preprocedural laboratory examination: Secondary | ICD-10-CM | POA: Diagnosis not present

## 2022-12-28 DIAGNOSIS — Z794 Long term (current) use of insulin: Secondary | ICD-10-CM | POA: Diagnosis not present

## 2022-12-28 DIAGNOSIS — N182 Chronic kidney disease, stage 2 (mild): Secondary | ICD-10-CM | POA: Diagnosis not present

## 2022-12-28 DIAGNOSIS — G894 Chronic pain syndrome: Secondary | ICD-10-CM | POA: Diagnosis not present

## 2022-12-28 DIAGNOSIS — E039 Hypothyroidism, unspecified: Secondary | ICD-10-CM | POA: Diagnosis not present

## 2022-12-28 DIAGNOSIS — Z01818 Encounter for other preprocedural examination: Secondary | ICD-10-CM | POA: Diagnosis not present

## 2022-12-28 DIAGNOSIS — Z6839 Body mass index (BMI) 39.0-39.9, adult: Secondary | ICD-10-CM | POA: Diagnosis not present

## 2022-12-28 DIAGNOSIS — E1122 Type 2 diabetes mellitus with diabetic chronic kidney disease: Secondary | ICD-10-CM | POA: Diagnosis not present

## 2022-12-28 DIAGNOSIS — Z79890 Hormone replacement therapy (postmenopausal): Secondary | ICD-10-CM | POA: Diagnosis not present

## 2022-12-28 DIAGNOSIS — Z79899 Other long term (current) drug therapy: Secondary | ICD-10-CM | POA: Diagnosis not present

## 2022-12-28 DIAGNOSIS — E876 Hypokalemia: Secondary | ICD-10-CM | POA: Diagnosis not present

## 2022-12-28 DIAGNOSIS — E785 Hyperlipidemia, unspecified: Secondary | ICD-10-CM | POA: Diagnosis not present

## 2022-12-28 DIAGNOSIS — I10 Essential (primary) hypertension: Secondary | ICD-10-CM | POA: Diagnosis not present

## 2022-12-28 DIAGNOSIS — E669 Obesity, unspecified: Secondary | ICD-10-CM | POA: Diagnosis not present

## 2022-12-28 DIAGNOSIS — Z86718 Personal history of other venous thrombosis and embolism: Secondary | ICD-10-CM | POA: Diagnosis not present

## 2022-12-28 DIAGNOSIS — M25511 Pain in right shoulder: Secondary | ICD-10-CM | POA: Diagnosis not present

## 2022-12-28 DIAGNOSIS — Z9181 History of falling: Secondary | ICD-10-CM | POA: Diagnosis not present

## 2022-12-28 DIAGNOSIS — I129 Hypertensive chronic kidney disease with stage 1 through stage 4 chronic kidney disease, or unspecified chronic kidney disease: Secondary | ICD-10-CM | POA: Diagnosis not present

## 2022-12-28 DIAGNOSIS — G8929 Other chronic pain: Secondary | ICD-10-CM | POA: Diagnosis not present

## 2022-12-28 DIAGNOSIS — N189 Chronic kidney disease, unspecified: Secondary | ICD-10-CM | POA: Diagnosis not present

## 2022-12-28 DIAGNOSIS — Z7982 Long term (current) use of aspirin: Secondary | ICD-10-CM | POA: Diagnosis not present

## 2022-12-29 ENCOUNTER — Encounter: Payer: Medicare Other | Admitting: Physical Therapy

## 2022-12-29 DIAGNOSIS — F324 Major depressive disorder, single episode, in partial remission: Secondary | ICD-10-CM | POA: Diagnosis not present

## 2022-12-29 DIAGNOSIS — M549 Dorsalgia, unspecified: Secondary | ICD-10-CM | POA: Diagnosis not present

## 2022-12-29 DIAGNOSIS — M5430 Sciatica, unspecified side: Secondary | ICD-10-CM | POA: Diagnosis not present

## 2022-12-29 DIAGNOSIS — Z6841 Body Mass Index (BMI) 40.0 and over, adult: Secondary | ICD-10-CM | POA: Diagnosis not present

## 2022-12-29 DIAGNOSIS — R5381 Other malaise: Secondary | ICD-10-CM | POA: Diagnosis not present

## 2022-12-29 DIAGNOSIS — R296 Repeated falls: Secondary | ICD-10-CM | POA: Diagnosis not present

## 2023-01-04 ENCOUNTER — Ambulatory Visit: Payer: Medicare Other | Admitting: Physical Therapy

## 2023-01-05 DIAGNOSIS — Z881 Allergy status to other antibiotic agents status: Secondary | ICD-10-CM | POA: Diagnosis not present

## 2023-01-05 DIAGNOSIS — Z96612 Presence of left artificial shoulder joint: Secondary | ICD-10-CM | POA: Diagnosis not present

## 2023-01-05 DIAGNOSIS — E669 Obesity, unspecified: Secondary | ICD-10-CM | POA: Diagnosis not present

## 2023-01-05 DIAGNOSIS — E1122 Type 2 diabetes mellitus with diabetic chronic kidney disease: Secondary | ICD-10-CM | POA: Diagnosis not present

## 2023-01-05 DIAGNOSIS — E039 Hypothyroidism, unspecified: Secondary | ICD-10-CM | POA: Diagnosis not present

## 2023-01-05 DIAGNOSIS — M170 Bilateral primary osteoarthritis of knee: Secondary | ICD-10-CM | POA: Diagnosis not present

## 2023-01-05 DIAGNOSIS — E785 Hyperlipidemia, unspecified: Secondary | ICD-10-CM | POA: Diagnosis not present

## 2023-01-05 DIAGNOSIS — Z9049 Acquired absence of other specified parts of digestive tract: Secondary | ICD-10-CM | POA: Diagnosis not present

## 2023-01-05 DIAGNOSIS — M797 Fibromyalgia: Secondary | ICD-10-CM | POA: Diagnosis not present

## 2023-01-05 DIAGNOSIS — I129 Hypertensive chronic kidney disease with stage 1 through stage 4 chronic kidney disease, or unspecified chronic kidney disease: Secondary | ICD-10-CM | POA: Diagnosis not present

## 2023-01-05 DIAGNOSIS — D649 Anemia, unspecified: Secondary | ICD-10-CM | POA: Diagnosis not present

## 2023-01-05 DIAGNOSIS — I10 Essential (primary) hypertension: Secondary | ICD-10-CM | POA: Diagnosis not present

## 2023-01-05 DIAGNOSIS — M6281 Muscle weakness (generalized): Secondary | ICD-10-CM | POA: Diagnosis not present

## 2023-01-05 DIAGNOSIS — M19011 Primary osteoarthritis, right shoulder: Secondary | ICD-10-CM | POA: Diagnosis not present

## 2023-01-05 DIAGNOSIS — N390 Urinary tract infection, site not specified: Secondary | ICD-10-CM | POA: Diagnosis not present

## 2023-01-05 DIAGNOSIS — Z8249 Family history of ischemic heart disease and other diseases of the circulatory system: Secondary | ICD-10-CM | POA: Diagnosis not present

## 2023-01-05 DIAGNOSIS — G8918 Other acute postprocedural pain: Secondary | ICD-10-CM | POA: Diagnosis not present

## 2023-01-05 DIAGNOSIS — R2689 Other abnormalities of gait and mobility: Secondary | ICD-10-CM | POA: Diagnosis not present

## 2023-01-05 DIAGNOSIS — E0840 Diabetes mellitus due to underlying condition with diabetic neuropathy, unspecified: Secondary | ICD-10-CM | POA: Diagnosis not present

## 2023-01-05 DIAGNOSIS — G9340 Encephalopathy, unspecified: Secondary | ICD-10-CM | POA: Diagnosis not present

## 2023-01-05 DIAGNOSIS — Z471 Aftercare following joint replacement surgery: Secondary | ICD-10-CM | POA: Diagnosis not present

## 2023-01-05 DIAGNOSIS — Z981 Arthrodesis status: Secondary | ICD-10-CM | POA: Diagnosis not present

## 2023-01-05 DIAGNOSIS — Z86718 Personal history of other venous thrombosis and embolism: Secondary | ICD-10-CM | POA: Diagnosis not present

## 2023-01-05 DIAGNOSIS — Z888 Allergy status to other drugs, medicaments and biological substances status: Secondary | ICD-10-CM | POA: Diagnosis not present

## 2023-01-05 DIAGNOSIS — E119 Type 2 diabetes mellitus without complications: Secondary | ICD-10-CM | POA: Diagnosis not present

## 2023-01-05 DIAGNOSIS — Z886 Allergy status to analgesic agent status: Secondary | ICD-10-CM | POA: Diagnosis not present

## 2023-01-05 DIAGNOSIS — N183 Chronic kidney disease, stage 3 unspecified: Secondary | ICD-10-CM | POA: Diagnosis not present

## 2023-01-05 DIAGNOSIS — N182 Chronic kidney disease, stage 2 (mild): Secondary | ICD-10-CM | POA: Diagnosis not present

## 2023-01-05 DIAGNOSIS — Z96611 Presence of right artificial shoulder joint: Secondary | ICD-10-CM | POA: Diagnosis not present

## 2023-01-05 DIAGNOSIS — R2681 Unsteadiness on feet: Secondary | ICD-10-CM | POA: Diagnosis not present

## 2023-01-05 DIAGNOSIS — M75101 Unspecified rotator cuff tear or rupture of right shoulder, not specified as traumatic: Secondary | ICD-10-CM | POA: Diagnosis not present

## 2023-01-05 DIAGNOSIS — E109 Type 1 diabetes mellitus without complications: Secondary | ICD-10-CM | POA: Diagnosis not present

## 2023-01-05 DIAGNOSIS — Z79890 Hormone replacement therapy (postmenopausal): Secondary | ICD-10-CM | POA: Diagnosis not present

## 2023-01-05 DIAGNOSIS — Z794 Long term (current) use of insulin: Secondary | ICD-10-CM | POA: Diagnosis not present

## 2023-01-05 DIAGNOSIS — M00811 Arthritis due to other bacteria, right shoulder: Secondary | ICD-10-CM | POA: Diagnosis not present

## 2023-01-05 DIAGNOSIS — Z880 Allergy status to penicillin: Secondary | ICD-10-CM | POA: Diagnosis not present

## 2023-01-05 DIAGNOSIS — M25511 Pain in right shoulder: Secondary | ICD-10-CM | POA: Diagnosis not present

## 2023-01-05 DIAGNOSIS — Z6841 Body Mass Index (BMI) 40.0 and over, adult: Secondary | ICD-10-CM | POA: Diagnosis not present

## 2023-01-05 DIAGNOSIS — Z9181 History of falling: Secondary | ICD-10-CM | POA: Diagnosis not present

## 2023-01-05 DIAGNOSIS — Z7982 Long term (current) use of aspirin: Secondary | ICD-10-CM | POA: Diagnosis not present

## 2023-01-10 DIAGNOSIS — E109 Type 1 diabetes mellitus without complications: Secondary | ICD-10-CM | POA: Diagnosis not present

## 2023-01-10 DIAGNOSIS — K5900 Constipation, unspecified: Secondary | ICD-10-CM | POA: Diagnosis not present

## 2023-01-10 DIAGNOSIS — I10 Essential (primary) hypertension: Secondary | ICD-10-CM | POA: Diagnosis not present

## 2023-01-10 DIAGNOSIS — E785 Hyperlipidemia, unspecified: Secondary | ICD-10-CM | POA: Diagnosis not present

## 2023-01-10 DIAGNOSIS — E119 Type 2 diabetes mellitus without complications: Secondary | ICD-10-CM | POA: Diagnosis not present

## 2023-01-10 DIAGNOSIS — Z471 Aftercare following joint replacement surgery: Secondary | ICD-10-CM | POA: Diagnosis not present

## 2023-01-10 DIAGNOSIS — R600 Localized edema: Secondary | ICD-10-CM | POA: Diagnosis not present

## 2023-01-10 DIAGNOSIS — G9340 Encephalopathy, unspecified: Secondary | ICD-10-CM | POA: Diagnosis not present

## 2023-01-10 DIAGNOSIS — Z7689 Persons encountering health services in other specified circumstances: Secondary | ICD-10-CM | POA: Diagnosis not present

## 2023-01-10 DIAGNOSIS — G8921 Chronic pain due to trauma: Secondary | ICD-10-CM | POA: Diagnosis not present

## 2023-01-10 DIAGNOSIS — M00811 Arthritis due to other bacteria, right shoulder: Secondary | ICD-10-CM | POA: Diagnosis not present

## 2023-01-10 DIAGNOSIS — Z96611 Presence of right artificial shoulder joint: Secondary | ICD-10-CM | POA: Diagnosis not present

## 2023-01-10 DIAGNOSIS — N183 Chronic kidney disease, stage 3 unspecified: Secondary | ICD-10-CM | POA: Diagnosis not present

## 2023-01-10 DIAGNOSIS — M6281 Muscle weakness (generalized): Secondary | ICD-10-CM | POA: Diagnosis not present

## 2023-01-10 DIAGNOSIS — E0840 Diabetes mellitus due to underlying condition with diabetic neuropathy, unspecified: Secondary | ICD-10-CM | POA: Diagnosis not present

## 2023-01-10 DIAGNOSIS — L89316 Pressure-induced deep tissue damage of right buttock: Secondary | ICD-10-CM | POA: Diagnosis not present

## 2023-01-10 DIAGNOSIS — R2689 Other abnormalities of gait and mobility: Secondary | ICD-10-CM | POA: Diagnosis not present

## 2023-01-10 DIAGNOSIS — D649 Anemia, unspecified: Secondary | ICD-10-CM | POA: Diagnosis not present

## 2023-01-10 DIAGNOSIS — N390 Urinary tract infection, site not specified: Secondary | ICD-10-CM | POA: Diagnosis not present

## 2023-01-10 DIAGNOSIS — E039 Hypothyroidism, unspecified: Secondary | ICD-10-CM | POA: Diagnosis not present

## 2023-01-10 DIAGNOSIS — R2681 Unsteadiness on feet: Secondary | ICD-10-CM | POA: Diagnosis not present

## 2023-01-10 DIAGNOSIS — L89326 Pressure-induced deep tissue damage of left buttock: Secondary | ICD-10-CM | POA: Diagnosis not present

## 2023-01-12 DIAGNOSIS — L89326 Pressure-induced deep tissue damage of left buttock: Secondary | ICD-10-CM | POA: Diagnosis not present

## 2023-01-12 DIAGNOSIS — L89316 Pressure-induced deep tissue damage of right buttock: Secondary | ICD-10-CM | POA: Diagnosis not present

## 2023-01-12 DIAGNOSIS — E0840 Diabetes mellitus due to underlying condition with diabetic neuropathy, unspecified: Secondary | ICD-10-CM | POA: Diagnosis not present

## 2023-01-12 DIAGNOSIS — E039 Hypothyroidism, unspecified: Secondary | ICD-10-CM | POA: Diagnosis not present

## 2023-01-16 DIAGNOSIS — Z7689 Persons encountering health services in other specified circumstances: Secondary | ICD-10-CM | POA: Diagnosis not present

## 2023-01-16 DIAGNOSIS — R600 Localized edema: Secondary | ICD-10-CM | POA: Diagnosis not present

## 2023-01-16 DIAGNOSIS — K5900 Constipation, unspecified: Secondary | ICD-10-CM | POA: Diagnosis not present

## 2023-01-17 DIAGNOSIS — K5900 Constipation, unspecified: Secondary | ICD-10-CM | POA: Diagnosis not present

## 2023-01-17 DIAGNOSIS — Z7689 Persons encountering health services in other specified circumstances: Secondary | ICD-10-CM | POA: Diagnosis not present

## 2023-01-17 DIAGNOSIS — R600 Localized edema: Secondary | ICD-10-CM | POA: Diagnosis not present

## 2023-01-18 DIAGNOSIS — L89326 Pressure-induced deep tissue damage of left buttock: Secondary | ICD-10-CM | POA: Diagnosis not present

## 2023-01-18 DIAGNOSIS — E039 Hypothyroidism, unspecified: Secondary | ICD-10-CM | POA: Diagnosis not present

## 2023-01-18 DIAGNOSIS — E0840 Diabetes mellitus due to underlying condition with diabetic neuropathy, unspecified: Secondary | ICD-10-CM | POA: Diagnosis not present

## 2023-01-18 DIAGNOSIS — L89316 Pressure-induced deep tissue damage of right buttock: Secondary | ICD-10-CM | POA: Diagnosis not present

## 2023-01-18 DIAGNOSIS — Z96611 Presence of right artificial shoulder joint: Secondary | ICD-10-CM | POA: Diagnosis not present

## 2023-01-20 DIAGNOSIS — M00811 Arthritis due to other bacteria, right shoulder: Secondary | ICD-10-CM | POA: Diagnosis not present

## 2023-01-20 DIAGNOSIS — G9340 Encephalopathy, unspecified: Secondary | ICD-10-CM | POA: Diagnosis not present

## 2023-01-20 DIAGNOSIS — D649 Anemia, unspecified: Secondary | ICD-10-CM | POA: Diagnosis not present

## 2023-01-20 DIAGNOSIS — I10 Essential (primary) hypertension: Secondary | ICD-10-CM | POA: Diagnosis not present

## 2023-01-20 DIAGNOSIS — N390 Urinary tract infection, site not specified: Secondary | ICD-10-CM | POA: Diagnosis not present

## 2023-01-20 DIAGNOSIS — N183 Chronic kidney disease, stage 3 unspecified: Secondary | ICD-10-CM | POA: Diagnosis not present

## 2023-01-20 DIAGNOSIS — M6281 Muscle weakness (generalized): Secondary | ICD-10-CM | POA: Diagnosis not present

## 2023-01-20 DIAGNOSIS — R2689 Other abnormalities of gait and mobility: Secondary | ICD-10-CM | POA: Diagnosis not present

## 2023-01-20 DIAGNOSIS — E0840 Diabetes mellitus due to underlying condition with diabetic neuropathy, unspecified: Secondary | ICD-10-CM | POA: Diagnosis not present

## 2023-01-23 DIAGNOSIS — Z96611 Presence of right artificial shoulder joint: Secondary | ICD-10-CM | POA: Diagnosis not present

## 2023-01-23 DIAGNOSIS — N390 Urinary tract infection, site not specified: Secondary | ICD-10-CM | POA: Diagnosis not present

## 2023-01-23 DIAGNOSIS — I10 Essential (primary) hypertension: Secondary | ICD-10-CM | POA: Diagnosis not present

## 2023-01-23 DIAGNOSIS — Z9181 History of falling: Secondary | ICD-10-CM | POA: Diagnosis not present

## 2023-01-23 DIAGNOSIS — E119 Type 2 diabetes mellitus without complications: Secondary | ICD-10-CM | POA: Diagnosis not present

## 2023-01-23 DIAGNOSIS — Z7982 Long term (current) use of aspirin: Secondary | ICD-10-CM | POA: Diagnosis not present

## 2023-01-23 DIAGNOSIS — Z794 Long term (current) use of insulin: Secondary | ICD-10-CM | POA: Diagnosis not present

## 2023-01-23 DIAGNOSIS — F32A Depression, unspecified: Secondary | ICD-10-CM | POA: Diagnosis not present

## 2023-01-23 DIAGNOSIS — Z471 Aftercare following joint replacement surgery: Secondary | ICD-10-CM | POA: Diagnosis not present

## 2023-01-24 DIAGNOSIS — Z471 Aftercare following joint replacement surgery: Secondary | ICD-10-CM | POA: Diagnosis not present

## 2023-01-24 DIAGNOSIS — Z96611 Presence of right artificial shoulder joint: Secondary | ICD-10-CM | POA: Diagnosis not present

## 2023-01-24 DIAGNOSIS — F32A Depression, unspecified: Secondary | ICD-10-CM | POA: Diagnosis not present

## 2023-01-24 DIAGNOSIS — I10 Essential (primary) hypertension: Secondary | ICD-10-CM | POA: Diagnosis not present

## 2023-01-24 DIAGNOSIS — N390 Urinary tract infection, site not specified: Secondary | ICD-10-CM | POA: Diagnosis not present

## 2023-01-24 DIAGNOSIS — E119 Type 2 diabetes mellitus without complications: Secondary | ICD-10-CM | POA: Diagnosis not present

## 2023-01-27 DIAGNOSIS — Z471 Aftercare following joint replacement surgery: Secondary | ICD-10-CM | POA: Diagnosis not present

## 2023-01-27 DIAGNOSIS — N390 Urinary tract infection, site not specified: Secondary | ICD-10-CM | POA: Diagnosis not present

## 2023-01-27 DIAGNOSIS — Z96611 Presence of right artificial shoulder joint: Secondary | ICD-10-CM | POA: Diagnosis not present

## 2023-01-27 DIAGNOSIS — I10 Essential (primary) hypertension: Secondary | ICD-10-CM | POA: Diagnosis not present

## 2023-01-27 DIAGNOSIS — E119 Type 2 diabetes mellitus without complications: Secondary | ICD-10-CM | POA: Diagnosis not present

## 2023-01-27 DIAGNOSIS — F32A Depression, unspecified: Secondary | ICD-10-CM | POA: Diagnosis not present

## 2023-01-31 DIAGNOSIS — Z96611 Presence of right artificial shoulder joint: Secondary | ICD-10-CM | POA: Diagnosis not present

## 2023-01-31 DIAGNOSIS — I10 Essential (primary) hypertension: Secondary | ICD-10-CM | POA: Diagnosis not present

## 2023-01-31 DIAGNOSIS — Z471 Aftercare following joint replacement surgery: Secondary | ICD-10-CM | POA: Diagnosis not present

## 2023-01-31 DIAGNOSIS — N390 Urinary tract infection, site not specified: Secondary | ICD-10-CM | POA: Diagnosis not present

## 2023-01-31 DIAGNOSIS — E119 Type 2 diabetes mellitus without complications: Secondary | ICD-10-CM | POA: Diagnosis not present

## 2023-01-31 DIAGNOSIS — F32A Depression, unspecified: Secondary | ICD-10-CM | POA: Diagnosis not present

## 2023-02-01 DIAGNOSIS — F32A Depression, unspecified: Secondary | ICD-10-CM | POA: Diagnosis not present

## 2023-02-01 DIAGNOSIS — N390 Urinary tract infection, site not specified: Secondary | ICD-10-CM | POA: Diagnosis not present

## 2023-02-01 DIAGNOSIS — Z96611 Presence of right artificial shoulder joint: Secondary | ICD-10-CM | POA: Diagnosis not present

## 2023-02-01 DIAGNOSIS — Z471 Aftercare following joint replacement surgery: Secondary | ICD-10-CM | POA: Diagnosis not present

## 2023-02-01 DIAGNOSIS — E119 Type 2 diabetes mellitus without complications: Secondary | ICD-10-CM | POA: Diagnosis not present

## 2023-02-01 DIAGNOSIS — I10 Essential (primary) hypertension: Secondary | ICD-10-CM | POA: Diagnosis not present

## 2023-02-02 DIAGNOSIS — R21 Rash and other nonspecific skin eruption: Secondary | ICD-10-CM | POA: Diagnosis not present

## 2023-02-02 DIAGNOSIS — Z09 Encounter for follow-up examination after completed treatment for conditions other than malignant neoplasm: Secondary | ICD-10-CM | POA: Diagnosis not present

## 2023-02-02 DIAGNOSIS — M19011 Primary osteoarthritis, right shoulder: Secondary | ICD-10-CM | POA: Diagnosis not present

## 2023-02-02 DIAGNOSIS — Z6841 Body Mass Index (BMI) 40.0 and over, adult: Secondary | ICD-10-CM | POA: Diagnosis not present

## 2023-02-03 DIAGNOSIS — Z471 Aftercare following joint replacement surgery: Secondary | ICD-10-CM | POA: Diagnosis not present

## 2023-02-03 DIAGNOSIS — F32A Depression, unspecified: Secondary | ICD-10-CM | POA: Diagnosis not present

## 2023-02-03 DIAGNOSIS — I10 Essential (primary) hypertension: Secondary | ICD-10-CM | POA: Diagnosis not present

## 2023-02-03 DIAGNOSIS — E119 Type 2 diabetes mellitus without complications: Secondary | ICD-10-CM | POA: Diagnosis not present

## 2023-02-03 DIAGNOSIS — Z96611 Presence of right artificial shoulder joint: Secondary | ICD-10-CM | POA: Diagnosis not present

## 2023-02-03 DIAGNOSIS — N390 Urinary tract infection, site not specified: Secondary | ICD-10-CM | POA: Diagnosis not present

## 2023-02-04 DIAGNOSIS — M25511 Pain in right shoulder: Secondary | ICD-10-CM | POA: Diagnosis not present

## 2023-02-07 DIAGNOSIS — N390 Urinary tract infection, site not specified: Secondary | ICD-10-CM | POA: Diagnosis not present

## 2023-02-07 DIAGNOSIS — H16103 Unspecified superficial keratitis, bilateral: Secondary | ICD-10-CM | POA: Diagnosis not present

## 2023-02-07 DIAGNOSIS — I10 Essential (primary) hypertension: Secondary | ICD-10-CM | POA: Diagnosis not present

## 2023-02-07 DIAGNOSIS — F32A Depression, unspecified: Secondary | ICD-10-CM | POA: Diagnosis not present

## 2023-02-07 DIAGNOSIS — Z471 Aftercare following joint replacement surgery: Secondary | ICD-10-CM | POA: Diagnosis not present

## 2023-02-07 DIAGNOSIS — H16223 Keratoconjunctivitis sicca, not specified as Sjogren's, bilateral: Secondary | ICD-10-CM | POA: Diagnosis not present

## 2023-02-07 DIAGNOSIS — Z961 Presence of intraocular lens: Secondary | ICD-10-CM | POA: Diagnosis not present

## 2023-02-07 DIAGNOSIS — Z96611 Presence of right artificial shoulder joint: Secondary | ICD-10-CM | POA: Diagnosis not present

## 2023-02-07 DIAGNOSIS — E119 Type 2 diabetes mellitus without complications: Secondary | ICD-10-CM | POA: Diagnosis not present

## 2023-02-09 DIAGNOSIS — N390 Urinary tract infection, site not specified: Secondary | ICD-10-CM | POA: Diagnosis not present

## 2023-02-09 DIAGNOSIS — E119 Type 2 diabetes mellitus without complications: Secondary | ICD-10-CM | POA: Diagnosis not present

## 2023-02-09 DIAGNOSIS — F32A Depression, unspecified: Secondary | ICD-10-CM | POA: Diagnosis not present

## 2023-02-09 DIAGNOSIS — Z471 Aftercare following joint replacement surgery: Secondary | ICD-10-CM | POA: Diagnosis not present

## 2023-02-09 DIAGNOSIS — Z96611 Presence of right artificial shoulder joint: Secondary | ICD-10-CM | POA: Diagnosis not present

## 2023-02-09 DIAGNOSIS — I10 Essential (primary) hypertension: Secondary | ICD-10-CM | POA: Diagnosis not present

## 2023-02-10 DIAGNOSIS — Z471 Aftercare following joint replacement surgery: Secondary | ICD-10-CM | POA: Diagnosis not present

## 2023-02-10 DIAGNOSIS — Z96611 Presence of right artificial shoulder joint: Secondary | ICD-10-CM | POA: Diagnosis not present

## 2023-02-10 DIAGNOSIS — E119 Type 2 diabetes mellitus without complications: Secondary | ICD-10-CM | POA: Diagnosis not present

## 2023-02-10 DIAGNOSIS — N390 Urinary tract infection, site not specified: Secondary | ICD-10-CM | POA: Diagnosis not present

## 2023-02-10 DIAGNOSIS — I10 Essential (primary) hypertension: Secondary | ICD-10-CM | POA: Diagnosis not present

## 2023-02-10 DIAGNOSIS — F32A Depression, unspecified: Secondary | ICD-10-CM | POA: Diagnosis not present

## 2023-02-14 DIAGNOSIS — N390 Urinary tract infection, site not specified: Secondary | ICD-10-CM | POA: Diagnosis not present

## 2023-02-14 DIAGNOSIS — Z96611 Presence of right artificial shoulder joint: Secondary | ICD-10-CM | POA: Diagnosis not present

## 2023-02-14 DIAGNOSIS — E119 Type 2 diabetes mellitus without complications: Secondary | ICD-10-CM | POA: Diagnosis not present

## 2023-02-14 DIAGNOSIS — F32A Depression, unspecified: Secondary | ICD-10-CM | POA: Diagnosis not present

## 2023-02-14 DIAGNOSIS — Z471 Aftercare following joint replacement surgery: Secondary | ICD-10-CM | POA: Diagnosis not present

## 2023-02-14 DIAGNOSIS — I10 Essential (primary) hypertension: Secondary | ICD-10-CM | POA: Diagnosis not present

## 2023-02-16 DIAGNOSIS — E119 Type 2 diabetes mellitus without complications: Secondary | ICD-10-CM | POA: Diagnosis not present

## 2023-02-16 DIAGNOSIS — I10 Essential (primary) hypertension: Secondary | ICD-10-CM | POA: Diagnosis not present

## 2023-02-16 DIAGNOSIS — N390 Urinary tract infection, site not specified: Secondary | ICD-10-CM | POA: Diagnosis not present

## 2023-02-16 DIAGNOSIS — F32A Depression, unspecified: Secondary | ICD-10-CM | POA: Diagnosis not present

## 2023-02-16 DIAGNOSIS — Z471 Aftercare following joint replacement surgery: Secondary | ICD-10-CM | POA: Diagnosis not present

## 2023-02-16 DIAGNOSIS — Z96611 Presence of right artificial shoulder joint: Secondary | ICD-10-CM | POA: Diagnosis not present

## 2023-02-17 DIAGNOSIS — E119 Type 2 diabetes mellitus without complications: Secondary | ICD-10-CM | POA: Diagnosis not present

## 2023-02-17 DIAGNOSIS — Z471 Aftercare following joint replacement surgery: Secondary | ICD-10-CM | POA: Diagnosis not present

## 2023-02-17 DIAGNOSIS — F32A Depression, unspecified: Secondary | ICD-10-CM | POA: Diagnosis not present

## 2023-02-17 DIAGNOSIS — I10 Essential (primary) hypertension: Secondary | ICD-10-CM | POA: Diagnosis not present

## 2023-02-17 DIAGNOSIS — Z96611 Presence of right artificial shoulder joint: Secondary | ICD-10-CM | POA: Diagnosis not present

## 2023-02-17 DIAGNOSIS — N390 Urinary tract infection, site not specified: Secondary | ICD-10-CM | POA: Diagnosis not present

## 2023-02-22 DIAGNOSIS — G894 Chronic pain syndrome: Secondary | ICD-10-CM | POA: Diagnosis not present

## 2023-02-22 DIAGNOSIS — M533 Sacrococcygeal disorders, not elsewhere classified: Secondary | ICD-10-CM | POA: Diagnosis not present

## 2023-02-22 DIAGNOSIS — M5417 Radiculopathy, lumbosacral region: Secondary | ICD-10-CM | POA: Diagnosis not present

## 2023-02-23 DIAGNOSIS — Z96611 Presence of right artificial shoulder joint: Secondary | ICD-10-CM | POA: Diagnosis not present

## 2023-02-23 DIAGNOSIS — M25511 Pain in right shoulder: Secondary | ICD-10-CM | POA: Diagnosis not present

## 2023-02-23 DIAGNOSIS — R6889 Other general symptoms and signs: Secondary | ICD-10-CM | POA: Diagnosis not present

## 2023-03-02 DIAGNOSIS — Z23 Encounter for immunization: Secondary | ICD-10-CM | POA: Diagnosis not present

## 2023-03-09 ENCOUNTER — Other Ambulatory Visit: Payer: Self-pay | Admitting: Pain Medicine

## 2023-03-09 DIAGNOSIS — M533 Sacrococcygeal disorders, not elsewhere classified: Secondary | ICD-10-CM

## 2023-03-15 NOTE — Discharge Instructions (Signed)

## 2023-03-16 ENCOUNTER — Other Ambulatory Visit: Payer: Self-pay | Admitting: Pain Medicine

## 2023-03-16 ENCOUNTER — Ambulatory Visit
Admission: RE | Admit: 2023-03-16 | Discharge: 2023-03-16 | Disposition: A | Discharge status: Home / Self Care | Payer: Medicare Other | Source: Ambulatory Visit | Attending: Pain Medicine | Admitting: Pain Medicine

## 2023-03-16 DIAGNOSIS — M4807 Spinal stenosis, lumbosacral region: Secondary | ICD-10-CM | POA: Diagnosis not present

## 2023-03-16 DIAGNOSIS — I700 Atherosclerosis of aorta: Secondary | ICD-10-CM | POA: Diagnosis not present

## 2023-03-16 DIAGNOSIS — M533 Sacrococcygeal disorders, not elsewhere classified: Secondary | ICD-10-CM

## 2023-03-16 DIAGNOSIS — Z981 Arthrodesis status: Secondary | ICD-10-CM | POA: Diagnosis not present

## 2023-03-16 DIAGNOSIS — M4316 Spondylolisthesis, lumbar region: Secondary | ICD-10-CM | POA: Diagnosis not present

## 2023-03-16 DIAGNOSIS — M48061 Spinal stenosis, lumbar region without neurogenic claudication: Secondary | ICD-10-CM | POA: Diagnosis not present

## 2023-03-16 MED ORDER — IOPAMIDOL (ISOVUE-M 200) INJECTION 41%
20.0000 mL | Freq: Once | INTRAMUSCULAR | Status: AC
  Administered 2023-03-16: 20 mL via INTRATHECAL

## 2023-03-16 MED ORDER — ONDANSETRON HCL 4 MG/2ML IJ SOLN
4.0000 mg | Freq: Once | INTRAMUSCULAR | Status: AC | PRN
  Administered 2023-03-16: 4 mg via INTRAMUSCULAR

## 2023-03-16 MED ORDER — DIAZEPAM 5 MG PO TABS: 5.0000 mg | ORAL_TABLET | Freq: Once | ORAL | Status: DC

## 2023-03-16 MED ORDER — MEPERIDINE HCL 50 MG/ML IJ SOLN
50.0000 mg | Freq: Once | INTRAMUSCULAR | Status: AC | PRN
  Administered 2023-03-16: 50 mg via INTRAMUSCULAR

## 2023-03-16 NOTE — Discharge Instr - Other Info (Signed)
1120: pt reports pain 8/10 from myelogram procedure. See [facility]

## 2023-03-21 ENCOUNTER — Ambulatory Visit
Admission: RE | Admit: 2023-03-21 | Discharge: 2023-03-21 | Disposition: A | Discharge status: Home / Self Care | Payer: Medicare Other | Source: Ambulatory Visit | Attending: Pain Medicine | Admitting: Pain Medicine

## 2023-03-21 DIAGNOSIS — M533 Sacrococcygeal disorders, not elsewhere classified: Secondary | ICD-10-CM | POA: Diagnosis not present

## 2023-03-21 DIAGNOSIS — K429 Umbilical hernia without obstruction or gangrene: Secondary | ICD-10-CM | POA: Diagnosis not present

## 2023-03-22 DIAGNOSIS — M2041 Other hammer toe(s) (acquired), right foot: Secondary | ICD-10-CM | POA: Diagnosis not present

## 2023-03-22 DIAGNOSIS — M7741 Metatarsalgia, right foot: Secondary | ICD-10-CM | POA: Diagnosis not present

## 2023-03-22 DIAGNOSIS — E119 Type 2 diabetes mellitus without complications: Secondary | ICD-10-CM | POA: Diagnosis not present

## 2023-03-22 DIAGNOSIS — M7742 Metatarsalgia, left foot: Secondary | ICD-10-CM | POA: Diagnosis not present

## 2023-03-22 DIAGNOSIS — M2042 Other hammer toe(s) (acquired), left foot: Secondary | ICD-10-CM | POA: Diagnosis not present

## 2023-03-22 DIAGNOSIS — M722 Plantar fascial fibromatosis: Secondary | ICD-10-CM | POA: Diagnosis not present

## 2023-03-27 DIAGNOSIS — L300 Nummular dermatitis: Secondary | ICD-10-CM | POA: Diagnosis not present

## 2023-03-27 DIAGNOSIS — L299 Pruritus, unspecified: Secondary | ICD-10-CM | POA: Diagnosis not present

## 2023-03-31 DIAGNOSIS — T887XXA Unspecified adverse effect of drug or medicament, initial encounter: Secondary | ICD-10-CM | POA: Diagnosis not present

## 2023-03-31 DIAGNOSIS — L299 Pruritus, unspecified: Secondary | ICD-10-CM | POA: Diagnosis not present

## 2023-03-31 DIAGNOSIS — M5430 Sciatica, unspecified side: Secondary | ICD-10-CM | POA: Diagnosis not present

## 2023-03-31 DIAGNOSIS — Z6839 Body mass index (BMI) 39.0-39.9, adult: Secondary | ICD-10-CM | POA: Diagnosis not present

## 2023-04-10 DIAGNOSIS — N1831 Chronic kidney disease, stage 3a: Secondary | ICD-10-CM | POA: Diagnosis not present

## 2023-04-10 DIAGNOSIS — E876 Hypokalemia: Secondary | ICD-10-CM | POA: Diagnosis not present

## 2023-05-03 DIAGNOSIS — M25562 Pain in left knee: Secondary | ICD-10-CM | POA: Diagnosis not present

## 2023-05-03 DIAGNOSIS — M25561 Pain in right knee: Secondary | ICD-10-CM | POA: Diagnosis not present

## 2023-05-03 DIAGNOSIS — M170 Bilateral primary osteoarthritis of knee: Secondary | ICD-10-CM | POA: Diagnosis not present

## 2023-05-03 DIAGNOSIS — R262 Difficulty in walking, not elsewhere classified: Secondary | ICD-10-CM | POA: Diagnosis not present

## 2023-05-04 ENCOUNTER — Ambulatory Visit: Payer: Medicare Other

## 2023-05-04 ENCOUNTER — Ambulatory Visit
Admission: EM | Admit: 2023-05-04 | Discharge: 2023-05-04 | Disposition: A | Discharge status: Home / Self Care | Payer: Medicare Other | Attending: Family Medicine | Admitting: Family Medicine

## 2023-05-04 DIAGNOSIS — S0083XA Contusion of other part of head, initial encounter: Secondary | ICD-10-CM

## 2023-05-04 DIAGNOSIS — M5417 Radiculopathy, lumbosacral region: Secondary | ICD-10-CM | POA: Diagnosis not present

## 2023-05-04 DIAGNOSIS — M533 Sacrococcygeal disorders, not elsewhere classified: Secondary | ICD-10-CM | POA: Diagnosis not present

## 2023-05-04 DIAGNOSIS — R519 Headache, unspecified: Secondary | ICD-10-CM | POA: Diagnosis not present

## 2023-05-04 DIAGNOSIS — S0993XA Unspecified injury of face, initial encounter: Secondary | ICD-10-CM | POA: Diagnosis not present

## 2023-05-04 DIAGNOSIS — G894 Chronic pain syndrome: Secondary | ICD-10-CM | POA: Diagnosis not present

## 2023-05-04 NOTE — ED Triage Notes (Signed)
[facility] presents to [facility] with [facility] of fall this afternoon when leaving her pain clinic appointment. Pt reports she fell forward and hit her face. Pt reports pain in nose and generalized aches. Pt reports no otc medications.

## 2023-05-04 NOTE — Discharge Instructions (Addendum)
Apply ice pack to painful areas for 10 to 15 minutes, 3 to 4 times daily  Continue until pain and swelling decrease.  May take Tylenol as needed for pain.  Use your cane for walking.  If symptoms become significantly worse during the night or over the weekend, proceed to the local emergency room.

## 2023-05-04 NOTE — ED Provider Notes (Signed)
[facility]    CSN: [id number] Arrival date & time: 05/04/23  1628      History   Chief Complaint Chief Complaint  Patient presents with   Fall    HPI Abigail [name] is a 82 y.o. female.   About five hours ago patient fell in a parking lot while leaving from a clinic appointment. She states that she fell forward, striking her nose and face on pavement.  She denies loss of consciousness, epistaxis, and other neurologic symptoms except a mild frontal headache.  She denies use of anticoagulants other than aspirin 81mg .   The history is provided by the patient.  Fall This is a recurrent problem. The current episode started 3 to 5 hours ago. The problem has been gradually improving. Associated symptoms include headaches. Pertinent negatives include no chest pain, no abdominal pain and no shortness of breath. She has tried nothing for the symptoms.    Past Medical History:  Diagnosis Date   Arthritis    shoulder, knee,    Boils    near vaginal area   Chronic kidney disease    stage 3; low kidney function; BUN was high acc to pt   Constipation due to pain medication    Depression    Diabetes mellitus    Type 2   Family hx of colon cancer    Fibromyalgia    Frequency of urination    Frequent urination at night    GERD (gastroesophageal reflux disease)    pt denies   Heart murmur    History of bladder infections    Hyperlipidemia    Hypertension    Hypothyroidism    Itchy eyes    Obesity    Obesity    PONV (postoperative nausea and vomiting)    with stapedectomy and    Torn rotator cuff left   current    Patient Active Problem List   Diagnosis Date Noted   Postoperative state 02/26/2020   Furuncle of vulva 12/06/2016   Bilateral carpal tunnel syndrome 11/23/2016   Neck pain 11/23/2016   Primary osteoarthritis of both first carpometacarpal joints 11/23/2016   Diabetic nephropathy (HCC) 08/18/2016   Right knee pain 01/03/2016   Asymmetrical  hearing loss of both ears 02/27/2015   Pressure ulcer 11/10/2014   Spondylolisthesis of lumbar region 11/07/2014   CKD (chronic kidney disease), stage III (HCC) 07/24/2013   Chest pain 07/23/2013   Chronic pain 07/23/2013   Fibromyalgia 07/23/2013   Leukocytosis 02/07/2013   Weakness generalized 02/07/2013   Acute encephalopathy 02/05/2013   Anemia 02/05/2013   Acute renal failure (HCC) 02/05/2013   Dehydration 09/12/2012   Diabetes mellitus (HCC) 09/12/2012   Hypertension 09/12/2012   Hypothyroidism 09/12/2012   Hyperlipidemia 09/12/2012    Past Surgical History:  Procedure Laterality Date   BACK SURGERY     lumbar fusion   BLADDER SUSPENSION N/A 02/26/2020   Procedure: TRANSVAGINAL TAPE (TVT) PROCEDURE;  Surgeon: Carrington Clamp, MD;  Location: [facility] [place];  Service: Gynecology;  Laterality: N/A;   CARPAL TUNNEL RELEASE Right 06/29/2017   Procedure: RIGHT CARPAL TUNNEL RELEASE;  Surgeon: Cindee Salt, MD;  Location: [facility];  Service: Orthopedics;  Laterality: Right;   CHOLECYSTECTOMY  1971   COLONOSCOPY     CYSTOCELE REPAIR N/A 02/26/2020   Procedure: ANTERIOR REPAIR (CYSTOCELE);  Surgeon: Carrington Clamp, MD;  Location: [facility];  Service: Gynecology;  Laterality: N/A;   CYSTOSCOPY N/A 02/26/2020   Procedure:  CYSTOSCOPY;  Surgeon: Carrington Clamp, MD;  Location: [facility];  Service: Gynecology;  Laterality: N/A;   DILATION AND CURETTAGE OF UTERUS     EYE SURGERY Bilateral 2020   MAXIMUM ACCESS (MAS)POSTERIOR LUMBAR INTERBODY FUSION (PLIF) 1 LEVEL N/A 11/07/2014   Procedure: Lumbar Four-Five Maximum access posterior lumbar interbody fusion;  Surgeon: Maeola Harman, MD;  Location: [facility];  Service: Neurosurgery;  Laterality: N/A;  L4-5 Maximum access posterior lumbar interbody fusion   SHOULDER SURGERY     bilateral rotator cuff repairs   STAPEDECTOMY Left    ear   TUBAL LIGATION  1971   VAGINAL  HYSTERECTOMY N/A 02/26/2020   Procedure: HYSTERECTOMY VAGINAL;  Surgeon: Carrington Clamp, MD;  Location: [facility];  Service: Gynecology;  Laterality: N/A;    OB History   No obstetric history on file.      Home Medications    Prior to Admission medications   Medication Sig Start Date End Date Taking? Authorizing Provider  acetaminophen (TYLENOL) 500 MG tablet Take 1,000 mg by mouth every 6 (six) hours as needed for moderate pain or headache.    [provider]  amitriptyline (ELAVIL) 10 MG tablet Take 20 mg by mouth at bedtime.     [provider]  amLODipine (NORVASC) 5 MG tablet Take 5 mg by mouth daily.     [provider]  aspirin 81 MG chewable tablet Chew by mouth daily.    [provider]  B Complex-C (B-COMPLEX WITH VITAMIN C) tablet Take 1 tablet by mouth every evening.    [provider]  carvedilol (COREG) 6.25 MG tablet Take 6.25 mg by mouth 2 (two) times daily.     [provider]  Cholecalciferol (VITAMIN D3) 1.25 MG (50000 UT) CAPS Take by mouth.    [provider]  Cyanocobalamin (VITAMIN B-12 IJ) Inject 1,000 mg as directed every 28 (twenty-eight) days.     [provider]  DULoxetine (CYMBALTA) 60 MG capsule Take 60 mg by mouth 2 (two) times daily.    [provider]  fluticasone (FLONASE) 50 MCG/ACT nasal spray Place 1-2 sprays into the nose daily as needed for allergies.     [provider]  gabapentin (NEURONTIN) 400 MG capsule Take 1 capsule (400 mg total) by mouth 3 (three) times daily. Patient taking differently: Take 400-800 mg by mouth See admin instructions. Take 400 mg in the morning, 400 mg at lunch, and 800 mg at supper 02/06/13   Zannie Cove, MD  LEVEMIR FLEXTOUCH 100 UNIT/ML Pen Inject 53 Units into the skin every morning.  07/25/14   [provider]  levothyroxine (SYNTHROID) 175 MCG tablet Take 175 mcg by mouth daily before breakfast.     [provider]  losartan-hydrochlorothiazide (HYZAAR) 100-25 MG per tablet Take 1 tablet by mouth daily. For Hypertension    [provider]  Melatonin 3 MG CAPS Take 3 mg by mouth at bedtime.     [provider]  mometasone (ELOCON) 0.1 % cream Apply 1 application topically daily as needed (rash).    [provider]  Multiple Vitamin (MULTIVITAMIN) tablet Take 1 tablet by mouth every evening.     [provider]  mupirocin ointment (BACTROBAN) 2 % Apply 1 application topically daily as needed (wound care).     [provider]  polyethylene glycol (MIRALAX / GLYCOLAX) packet Take 17 g by mouth daily as needed for mild constipation.     [provider]  Polyvinyl Alcohol-Povidone (REFRESH OP) Place 1 drop into both eyes daily as needed (dry eyes).    [provider]  pravastatin (PRAVACHOL) 80 MG tablet Take 80 mg by mouth daily.    [provider]  Probiotic Product (PROBIOTIC DAILY PO) Take 1 capsule by mouth daily.    [provider]  tiZANidine (ZANAFLEX) 4 MG capsule Take 4 mg by mouth at bedtime as needed for muscle spasms.     [provider]  tolterodine (DETROL) 2 MG tablet Take 2 mg by mouth 2 (two) times daily.    [provider]  traMADol (ULTRAM) 50 MG tablet Take 1 tablet (50 mg total) by mouth every 6 (six) hours as needed. 02/26/22   Eustace Moore, MD    Family History Family History  Problem Relation Age of Onset   Diabetes Mother        and Sister    Uterine cancer Sister    Colon cancer Brother     Social History Social History   Tobacco Use   Smoking status: Never   Smokeless tobacco: Never  Vaping Use   Vaping status: Never Used  Substance Use Topics   Alcohol use: No   Drug use: No     Allergies   Nsaids, Onglyza [saxagliptin], Penicillins, Polysporin [bacitracin-polymyxin b], Cephalosporins, Ketoconazole, Neo-bacit-poly-lidocaine, Tioconazole,  Lisinopril, Mucinex [guaifenesin er], and Victoza [liraglutide]   Review of Systems Review of Systems  Constitutional:  Negative for activity change, appetite change, chills, diaphoresis, fatigue and fever.  HENT:  Positive for facial swelling. Negative for congestion, ear pain, hearing loss, mouth sores, nosebleeds and sinus pain.   Eyes:  Negative for visual disturbance.  Respiratory:  Negative for chest tightness and shortness of breath.   Cardiovascular:  Negative for chest pain.  Gastrointestinal:  Negative for abdominal pain.  Genitourinary: Negative.   Musculoskeletal: Negative.   Skin:  Negative for color change and wound.  Neurological:  Positive for headaches. Negative for dizziness, syncope, facial asymmetry, speech difficulty, weakness, light-headedness and numbness.  Psychiatric/Behavioral:  Negative for confusion.      Physical Exam Triage Vital Signs ED Triage Vitals  Encounter Vitals Group     BP 05/04/23 1755 (!) 159/84     Systolic BP Percentile --      Diastolic BP Percentile --      Pulse --      Resp 05/04/23 1755 20     Temp 05/04/23 1755 97.7 F (36.5 C)     Temp src --      SpO2 05/04/23 1755 98 %     Weight --      Height --      Head Circumference --      Peak Flow --      Pain Score 05/04/23 1751 4     Pain Loc --      Pain Education --      Exclude from Growth Chart --    No data found.  Updated Vital Signs BP (!) 159/84   Temp 97.7 F (36.5 C)   Resp 20   SpO2 98%   Visual Acuity Right Eye Distance:   Left Eye Distance:   Bilateral Distance:    Right Eye Near:   Left Eye Near:    Bilateral Near:     Physical Exam Vitals and nursing note reviewed.  Constitutional:      General: She is not in acute distress.    Appearance: She  is not ill-appearing.  HENT:     Head:     Jaw: There is normal jaw occlusion.     Right Ear: External ear normal.     Left Ear: External ear normal.     Nose: Nasal tenderness present. No nasal  deformity, septal deviation, laceration or mucosal edema.     Right Nostril: No epistaxis or septal hematoma.     Left Nostril: No epistaxis or septal hematoma.     Right Turbinates: Not swollen.     Left Turbinates: Not swollen.     Right Sinus: No maxillary sinus tenderness or frontal sinus tenderness.     Left Sinus: No maxillary sinus tenderness or frontal sinus tenderness.      Comments: Mild swelling/tenderness nose without abrasions or deformity.    Mouth/Throat:     Mouth: Mucous membranes are moist. No injury, lacerations or oral lesions.     Pharynx: Oropharynx is clear.  Eyes:     Extraocular Movements: Extraocular movements intact.     Conjunctiva/sclera: Conjunctivae normal.     Pupils: Pupils are equal, round, and reactive to light.  Cardiovascular:     Rate and Rhythm: Normal rate and regular rhythm.     Heart sounds: Normal heart sounds.  Pulmonary:     Effort: Pulmonary effort is normal.     Breath sounds: Normal breath sounds.  Abdominal:     Palpations: Abdomen is soft.     Tenderness: There is no abdominal tenderness.  Musculoskeletal:        General: No swelling, tenderness or deformity.     Cervical back: Normal range of motion and neck supple. No tenderness.  Skin:    General: Skin is warm and dry.     Findings: No bruising.  Neurological:     Mental Status: She is alert and oriented to person, place, and time.     Cranial Nerves: No cranial nerve deficit.     Sensory: No sensory deficit.     Motor: No weakness.     Coordination: Coordination normal.     Deep Tendon Reflexes: Reflexes normal.      UC Treatments / Results  Labs (all labs ordered are listed, but only abnormal results are displayed) Labs Reviewed - No data to display  EKG   Radiology DG Facial Bones Complete  Result Date: 05/04/2023 CLINICAL DATA:  Pain to nose and chin after hitting face on pavement EXAM: FACIAL BONES COMPLETE 3+V COMPARISON:  12/24/2012 FINDINGS: There is no  evidence of fracture or other significant bone abnormality. No orbital emphysema or sinus air-fluid levels are seen. IMPRESSION: Negative facial radiographs. If there is persistent concern for facial bone fracture, CT maxillofacial without contrast is recommended. Electronically Signed   By: Wiliam Ke M.D.   On: 05/04/2023 19:34    Procedures Procedures (including critical care time)  Medications Ordered in UC Medications - No data to display  Initial Impression / Assessment and Plan / UC Course  I have reviewed the triage vital signs and the nursing notes.  Pertinent labs & imaging results that were available during my care of the patient were reviewed by me and considered in my medical decision making (see chart for details).    Negative x-rays reassuring. Neurologic exam normal. Followup with Family Doctor if not improved in about 4 days.  Final Clinical Impressions(s) / UC Diagnoses   Final diagnoses:  Contusion of face, initial encounter     Discharge Instructions  Apply ice pack to painful areas for 10 to 15 minutes, 3 to 4 times daily  Continue until pain and swelling decrease.  May take Tylenol as needed for pain.  Use your cane for walking.  If symptoms become significantly worse during the night or over the weekend, proceed to the local emergency room.     ED Prescriptions   None       Lattie Haw, MD 05/05/23 1337

## 2023-05-10 DIAGNOSIS — M25561 Pain in right knee: Secondary | ICD-10-CM | POA: Diagnosis not present

## 2023-05-10 DIAGNOSIS — M25562 Pain in left knee: Secondary | ICD-10-CM | POA: Diagnosis not present

## 2023-05-10 DIAGNOSIS — M170 Bilateral primary osteoarthritis of knee: Secondary | ICD-10-CM | POA: Diagnosis not present

## 2023-05-10 DIAGNOSIS — R262 Difficulty in walking, not elsewhere classified: Secondary | ICD-10-CM | POA: Diagnosis not present

## 2023-05-19 DIAGNOSIS — M25562 Pain in left knee: Secondary | ICD-10-CM | POA: Diagnosis not present

## 2023-05-19 DIAGNOSIS — M170 Bilateral primary osteoarthritis of knee: Secondary | ICD-10-CM | POA: Diagnosis not present

## 2023-05-19 DIAGNOSIS — M25561 Pain in right knee: Secondary | ICD-10-CM | POA: Diagnosis not present

## 2023-05-19 DIAGNOSIS — R262 Difficulty in walking, not elsewhere classified: Secondary | ICD-10-CM | POA: Diagnosis not present

## 2023-06-01 DIAGNOSIS — M25561 Pain in right knee: Secondary | ICD-10-CM | POA: Diagnosis not present

## 2023-06-01 DIAGNOSIS — M170 Bilateral primary osteoarthritis of knee: Secondary | ICD-10-CM | POA: Diagnosis not present

## 2023-06-01 DIAGNOSIS — R262 Difficulty in walking, not elsewhere classified: Secondary | ICD-10-CM | POA: Diagnosis not present

## 2023-06-01 DIAGNOSIS — M25562 Pain in left knee: Secondary | ICD-10-CM | POA: Diagnosis not present

## 2023-06-26 DIAGNOSIS — B029 Zoster without complications: Secondary | ICD-10-CM | POA: Diagnosis not present

## 2023-06-26 DIAGNOSIS — Z6839 Body mass index (BMI) 39.0-39.9, adult: Secondary | ICD-10-CM | POA: Diagnosis not present

## 2023-07-10 DIAGNOSIS — Z6838 Body mass index (BMI) 38.0-38.9, adult: Secondary | ICD-10-CM | POA: Diagnosis not present

## 2023-07-10 DIAGNOSIS — N1832 Chronic kidney disease, stage 3b: Secondary | ICD-10-CM | POA: Diagnosis not present

## 2023-07-10 DIAGNOSIS — E113293 Type 2 diabetes mellitus with mild nonproliferative diabetic retinopathy without macular edema, bilateral: Secondary | ICD-10-CM | POA: Diagnosis not present

## 2023-07-10 DIAGNOSIS — L299 Pruritus, unspecified: Secondary | ICD-10-CM | POA: Diagnosis not present

## 2023-07-10 DIAGNOSIS — N184 Chronic kidney disease, stage 4 (severe): Secondary | ICD-10-CM | POA: Diagnosis not present

## 2023-07-10 DIAGNOSIS — E039 Hypothyroidism, unspecified: Secondary | ICD-10-CM | POA: Diagnosis not present

## 2023-07-12 DIAGNOSIS — Z85828 Personal history of other malignant neoplasm of skin: Secondary | ICD-10-CM | POA: Diagnosis not present

## 2023-07-12 DIAGNOSIS — L2989 Other pruritus: Secondary | ICD-10-CM | POA: Diagnosis not present

## 2023-07-12 DIAGNOSIS — D1801 Hemangioma of skin and subcutaneous tissue: Secondary | ICD-10-CM | POA: Diagnosis not present

## 2023-07-12 DIAGNOSIS — L821 Other seborrheic keratosis: Secondary | ICD-10-CM | POA: Diagnosis not present

## 2023-07-19 DIAGNOSIS — R296 Repeated falls: Secondary | ICD-10-CM | POA: Diagnosis not present

## 2023-07-19 DIAGNOSIS — M6281 Muscle weakness (generalized): Secondary | ICD-10-CM | POA: Diagnosis not present

## 2023-07-21 DIAGNOSIS — M6281 Muscle weakness (generalized): Secondary | ICD-10-CM | POA: Diagnosis not present

## 2023-07-21 DIAGNOSIS — R296 Repeated falls: Secondary | ICD-10-CM | POA: Diagnosis not present

## 2023-07-27 DIAGNOSIS — R2681 Unsteadiness on feet: Secondary | ICD-10-CM | POA: Diagnosis not present

## 2023-07-31 DIAGNOSIS — N1831 Chronic kidney disease, stage 3a: Secondary | ICD-10-CM | POA: Diagnosis not present

## 2023-07-31 DIAGNOSIS — E876 Hypokalemia: Secondary | ICD-10-CM | POA: Diagnosis not present

## 2023-08-02 DIAGNOSIS — R2681 Unsteadiness on feet: Secondary | ICD-10-CM | POA: Diagnosis not present

## 2023-08-04 DIAGNOSIS — R2681 Unsteadiness on feet: Secondary | ICD-10-CM | POA: Diagnosis not present

## 2023-08-09 DIAGNOSIS — R2681 Unsteadiness on feet: Secondary | ICD-10-CM | POA: Diagnosis not present

## 2023-08-16 DIAGNOSIS — R262 Difficulty in walking, not elsewhere classified: Secondary | ICD-10-CM | POA: Diagnosis not present

## 2023-08-16 DIAGNOSIS — L821 Other seborrheic keratosis: Secondary | ICD-10-CM | POA: Diagnosis not present

## 2023-08-16 DIAGNOSIS — M25561 Pain in right knee: Secondary | ICD-10-CM | POA: Diagnosis not present

## 2023-08-16 DIAGNOSIS — M170 Bilateral primary osteoarthritis of knee: Secondary | ICD-10-CM | POA: Diagnosis not present

## 2023-08-16 DIAGNOSIS — Z85828 Personal history of other malignant neoplasm of skin: Secondary | ICD-10-CM | POA: Diagnosis not present

## 2023-08-16 DIAGNOSIS — M25562 Pain in left knee: Secondary | ICD-10-CM | POA: Diagnosis not present

## 2023-08-16 DIAGNOSIS — L2989 Other pruritus: Secondary | ICD-10-CM | POA: Diagnosis not present

## 2023-08-21 DIAGNOSIS — E119 Type 2 diabetes mellitus without complications: Secondary | ICD-10-CM | POA: Diagnosis not present

## 2023-08-21 DIAGNOSIS — E1140 Type 2 diabetes mellitus with diabetic neuropathy, unspecified: Secondary | ICD-10-CM | POA: Diagnosis not present

## 2023-08-21 DIAGNOSIS — I10 Essential (primary) hypertension: Secondary | ICD-10-CM | POA: Diagnosis not present

## 2023-08-21 DIAGNOSIS — N1832 Chronic kidney disease, stage 3b: Secondary | ICD-10-CM | POA: Diagnosis not present

## 2023-09-05 DIAGNOSIS — L299 Pruritus, unspecified: Secondary | ICD-10-CM | POA: Diagnosis not present

## 2023-09-27 DIAGNOSIS — E1140 Type 2 diabetes mellitus with diabetic neuropathy, unspecified: Secondary | ICD-10-CM | POA: Diagnosis not present

## 2023-09-27 DIAGNOSIS — E782 Mixed hyperlipidemia: Secondary | ICD-10-CM | POA: Diagnosis not present

## 2023-09-27 DIAGNOSIS — F32A Depression, unspecified: Secondary | ICD-10-CM | POA: Diagnosis not present

## 2023-09-27 DIAGNOSIS — E119 Type 2 diabetes mellitus without complications: Secondary | ICD-10-CM | POA: Diagnosis not present

## 2023-09-27 DIAGNOSIS — M19049 Primary osteoarthritis, unspecified hand: Secondary | ICD-10-CM | POA: Diagnosis not present

## 2023-09-27 DIAGNOSIS — I10 Essential (primary) hypertension: Secondary | ICD-10-CM | POA: Diagnosis not present

## 2023-09-27 DIAGNOSIS — N1832 Chronic kidney disease, stage 3b: Secondary | ICD-10-CM | POA: Diagnosis not present

## 2023-10-28 DIAGNOSIS — E782 Mixed hyperlipidemia: Secondary | ICD-10-CM | POA: Diagnosis not present

## 2023-10-28 DIAGNOSIS — F32A Depression, unspecified: Secondary | ICD-10-CM | POA: Diagnosis not present

## 2023-10-28 DIAGNOSIS — M19049 Primary osteoarthritis, unspecified hand: Secondary | ICD-10-CM | POA: Diagnosis not present

## 2023-10-28 DIAGNOSIS — E119 Type 2 diabetes mellitus without complications: Secondary | ICD-10-CM | POA: Diagnosis not present

## 2023-11-09 DIAGNOSIS — E1140 Type 2 diabetes mellitus with diabetic neuropathy, unspecified: Secondary | ICD-10-CM | POA: Diagnosis not present

## 2023-11-09 DIAGNOSIS — N1832 Chronic kidney disease, stage 3b: Secondary | ICD-10-CM | POA: Diagnosis not present

## 2023-11-09 DIAGNOSIS — E119 Type 2 diabetes mellitus without complications: Secondary | ICD-10-CM | POA: Diagnosis not present

## 2023-11-09 DIAGNOSIS — I10 Essential (primary) hypertension: Secondary | ICD-10-CM | POA: Diagnosis not present

## 2023-12-13 DIAGNOSIS — R262 Difficulty in walking, not elsewhere classified: Secondary | ICD-10-CM | POA: Diagnosis not present

## 2023-12-13 DIAGNOSIS — M25561 Pain in right knee: Secondary | ICD-10-CM | POA: Diagnosis not present

## 2023-12-13 DIAGNOSIS — M25562 Pain in left knee: Secondary | ICD-10-CM | POA: Diagnosis not present

## 2023-12-13 DIAGNOSIS — M170 Bilateral primary osteoarthritis of knee: Secondary | ICD-10-CM | POA: Diagnosis not present

## 2023-12-21 DIAGNOSIS — M170 Bilateral primary osteoarthritis of knee: Secondary | ICD-10-CM | POA: Diagnosis not present

## 2023-12-21 DIAGNOSIS — M25562 Pain in left knee: Secondary | ICD-10-CM | POA: Diagnosis not present

## 2023-12-21 DIAGNOSIS — R262 Difficulty in walking, not elsewhere classified: Secondary | ICD-10-CM | POA: Diagnosis not present

## 2023-12-21 DIAGNOSIS — M25561 Pain in right knee: Secondary | ICD-10-CM | POA: Diagnosis not present

## 2023-12-22 DIAGNOSIS — I10 Essential (primary) hypertension: Secondary | ICD-10-CM | POA: Diagnosis not present

## 2023-12-22 DIAGNOSIS — E538 Deficiency of other specified B group vitamins: Secondary | ICD-10-CM | POA: Diagnosis not present

## 2023-12-22 DIAGNOSIS — E559 Vitamin D deficiency, unspecified: Secondary | ICD-10-CM | POA: Diagnosis not present

## 2023-12-22 DIAGNOSIS — F324 Major depressive disorder, single episode, in partial remission: Secondary | ICD-10-CM | POA: Diagnosis not present

## 2023-12-22 DIAGNOSIS — E039 Hypothyroidism, unspecified: Secondary | ICD-10-CM | POA: Diagnosis not present

## 2023-12-22 DIAGNOSIS — H6980 Other specified disorders of Eustachian tube, unspecified ear: Secondary | ICD-10-CM | POA: Diagnosis not present

## 2023-12-22 DIAGNOSIS — E113293 Type 2 diabetes mellitus with mild nonproliferative diabetic retinopathy without macular edema, bilateral: Secondary | ICD-10-CM | POA: Diagnosis not present

## 2023-12-22 DIAGNOSIS — Z6841 Body Mass Index (BMI) 40.0 and over, adult: Secondary | ICD-10-CM | POA: Diagnosis not present

## 2023-12-22 DIAGNOSIS — Z0000 Encounter for general adult medical examination without abnormal findings: Secondary | ICD-10-CM | POA: Diagnosis not present

## 2023-12-22 DIAGNOSIS — E782 Mixed hyperlipidemia: Secondary | ICD-10-CM | POA: Diagnosis not present

## 2023-12-22 DIAGNOSIS — G4700 Insomnia, unspecified: Secondary | ICD-10-CM | POA: Diagnosis not present

## 2024-01-01 ENCOUNTER — Other Ambulatory Visit: Payer: Self-pay

## 2024-01-01 ENCOUNTER — Ambulatory Visit
Admission: EM | Admit: 2024-01-01 | Discharge: 2024-01-01 | Disposition: A | Discharge status: Home / Self Care | Source: Ambulatory Visit | Attending: Family Medicine | Admitting: Family Medicine

## 2024-01-01 DIAGNOSIS — H1011 Acute atopic conjunctivitis, right eye: Secondary | ICD-10-CM

## 2024-01-01 MED ORDER — AZELASTINE HCL 0.05 % OP SOLN: 1.0000 [drp] | Freq: Two times a day (BID) | OPHTHALMIC | 0 refills | Status: AC

## 2024-01-01 NOTE — ED Provider Notes (Signed)
 [facility]    CSN: [id number] Arrival date & time: 01/01/24  1245      History   Chief Complaint No chief complaint on file.   HPI Abigail [name] is a 83 y.o. female with a past medical history of hypertension, hyperlipidemia, GERD, fibromyalgia, diabetes, chronic kidney disease presents for eye redness.  Patient reports 2 to 3 days of bilateral eye redness with itching/irritation.  She denies any eye drainage, visual changes, eye pain, swelling around the eye.  Denies URI or allergy symptoms.  Does not wear glasses or contacts.  She used some lubricating eyedrops which she felt helped.  No other concerns at this time  HPI  Past Medical History:  Diagnosis Date   Arthritis    shoulder, knee,    Boils    near vaginal area   Chronic kidney disease    stage 3; low kidney function; BUN was high acc to pt   Constipation due to pain medication    Depression    Diabetes mellitus    Type 2   Family hx of colon cancer    Fibromyalgia    Frequency of urination    Frequent urination at night    GERD (gastroesophageal reflux disease)    pt denies   Heart murmur    History of bladder infections    Hyperlipidemia    Hypertension    Hypothyroidism    Itchy eyes    Obesity    Obesity    PONV (postoperative nausea and vomiting)    with stapedectomy and    Torn rotator cuff left   current    Patient Active Problem List   Diagnosis Date Noted   Postoperative state 02/26/2020   Furuncle of vulva 12/06/2016   Bilateral carpal tunnel syndrome 11/23/2016   Neck pain 11/23/2016   Primary osteoarthritis of both first carpometacarpal joints 11/23/2016   Diabetic nephropathy (HCC) 08/18/2016   Right knee pain 01/03/2016   Asymmetrical hearing loss of both ears 02/27/2015   Pressure ulcer 11/10/2014   Spondylolisthesis of lumbar region 11/07/2014   CKD (chronic kidney disease), stage III (HCC) 07/24/2013   Chest pain 07/23/2013   Chronic pain 07/23/2013    Fibromyalgia 07/23/2013   Leukocytosis 02/07/2013   Weakness generalized 02/07/2013   Acute encephalopathy 02/05/2013   Anemia 02/05/2013   Acute renal failure (HCC) 02/05/2013   Dehydration 09/12/2012   Diabetes mellitus (HCC) 09/12/2012   Hypertension 09/12/2012   Hypothyroidism 09/12/2012   Hyperlipidemia 09/12/2012    Past Surgical History:  Procedure Laterality Date   BACK SURGERY     lumbar fusion   BLADDER SUSPENSION N/A 02/26/2020   Procedure: TRANSVAGINAL TAPE (TVT) PROCEDURE;  Surgeon: Sarrah Browning, MD;  Location: [facility] ;  Service: Gynecology;  Laterality: N/A;   CARPAL TUNNEL RELEASE Right 06/29/2017   Procedure: RIGHT CARPAL TUNNEL RELEASE;  Surgeon: Murrell Kuba, MD;  Location: [facility];  Service: Orthopedics;  Laterality: Right;   CHOLECYSTECTOMY  1971   COLONOSCOPY     CYSTOCELE REPAIR N/A 02/26/2020   Procedure: ANTERIOR REPAIR (CYSTOCELE);  Surgeon: Sarrah Browning, MD;  Location: [facility];  Service: Gynecology;  Laterality: N/A;   CYSTOSCOPY N/A 02/26/2020   Procedure: CYSTOSCOPY;  Surgeon: Sarrah Browning, MD;  Location: [facility];  Service: Gynecology;  Laterality: N/A;   DILATION AND CURETTAGE OF UTERUS     EYE SURGERY Bilateral 2020   MAXIMUM ACCESS (MAS)POSTERIOR LUMBAR INTERBODY FUSION (PLIF) 1  LEVEL N/A 11/07/2014   Procedure: Lumbar Four-Five Maximum access posterior lumbar interbody fusion;  Surgeon: Fairy Levels, MD;  Location: [facility];  Service: Neurosurgery;  Laterality: N/A;  L4-5 Maximum access posterior lumbar interbody fusion   SHOULDER SURGERY     bilateral rotator cuff repairs   STAPEDECTOMY Left    ear   TUBAL LIGATION  1971   VAGINAL HYSTERECTOMY N/A 02/26/2020   Procedure: HYSTERECTOMY VAGINAL;  Surgeon: Sarrah Browning, MD;  Location: [facility];  Service: Gynecology;  Laterality: N/A;    OB History   No obstetric history on file.       Home Medications    Prior to Admission medications   Medication Sig Start Date End Date Taking? Authorizing Provider  azelastine  (OPTIVAR ) 0.05 % ophthalmic solution Place 1 drop into the right eye 2 (two) times daily for 7 days. 01/01/24 01/08/24 Yes Bonner Larue, Jodi R, NP  acetaminophen  (TYLENOL ) 500 MG tablet Take 1,000 mg by mouth every 6 (six) hours as needed for moderate pain or headache.    [provider]  amitriptyline  (ELAVIL ) 10 MG tablet Take 20 mg by mouth at bedtime.     [provider]  amLODipine  (NORVASC ) 5 MG tablet Take 5 mg by mouth daily.     [provider]  aspirin  81 MG chewable tablet Chew by mouth daily.    [provider]  B Complex-C (B-COMPLEX WITH VITAMIN C) tablet Take 1 tablet by mouth every evening.    [provider]  carvedilol  (COREG ) 6.25 MG tablet Take 6.25 mg by mouth 2 (two) times daily.     [provider]  Cholecalciferol (VITAMIN D3) 1.25 MG (50000 UT) CAPS Take by mouth.    [provider]  Cyanocobalamin  (VITAMIN B-12 IJ) Inject 1,000 mg as directed every 28 (twenty-eight) days.     [provider]  DULoxetine  (CYMBALTA ) 60 MG capsule Take 60 mg by mouth 2 (two) times daily.    [provider]  fluticasone  (FLONASE ) 50 MCG/ACT nasal spray Place 1-2 sprays into the nose daily as needed for allergies.     [provider]  gabapentin  (NEURONTIN ) 400 MG capsule Take 1 capsule (400 mg total) by mouth 3 (three) times daily. Patient taking differently: Take 400-800 mg by mouth See admin instructions. Take 400 mg in the morning, 400 mg at lunch, and 800 mg at supper 02/06/13   Fairy Frames, MD  LEVEMIR  FLEXTOUCH 100 UNIT/ML Pen Inject 53 Units into the skin every morning.  07/25/14   [provider]  levothyroxine  (SYNTHROID ) 175 MCG tablet Take 175 mcg by mouth daily before breakfast.    [provider]  losartan -hydrochlorothiazide  (HYZAAR) 100-25 MG  per tablet Take 1 tablet by mouth daily. For Hypertension    [provider]  Melatonin 3 MG CAPS Take 3 mg by mouth at bedtime.     [provider]  mometasone  (ELOCON ) 0.1 % cream Apply 1 application topically daily as needed (rash).    [provider]  Multiple Vitamin (MULTIVITAMIN) tablet Take 1 tablet by mouth every evening.     [provider]  mupirocin  ointment (BACTROBAN ) 2 % Apply 1 application topically daily as needed (wound care).     [provider]  polyethylene glycol (MIRALAX  / GLYCOLAX ) packet Take 17 g by mouth daily as needed for mild constipation.     [provider]  Polyvinyl Alcohol-Povidone (REFRESH OP) Place 1 drop into both eyes daily as  needed (dry eyes).    [provider]  pravastatin  (PRAVACHOL ) 80 MG tablet Take 80 mg by mouth daily.    [provider]  Probiotic Product (PROBIOTIC DAILY PO) Take 1 capsule by mouth daily.    [provider]  tiZANidine  (ZANAFLEX ) 4 MG capsule Take 4 mg by mouth at bedtime as needed for muscle spasms.     [provider]  tolterodine (DETROL) 2 MG tablet Take 2 mg by mouth 2 (two) times daily.    [provider]  traMADol  (ULTRAM ) 50 MG tablet Take 1 tablet (50 mg total) by mouth every 6 (six) hours as needed. 02/26/22   Maranda Jamee Jacob, MD    Family History Family History  Problem Relation Age of Onset   Diabetes Mother        and Sister    Uterine cancer Sister    Colon cancer Brother     Social History Social History   Tobacco Use   Smoking status: Never   Smokeless tobacco: Never  Vaping Use   Vaping status: Never Used  Substance Use Topics   Alcohol use: No   Drug use: No     Allergies   Nsaids, Onglyza [saxagliptin], Penicillins, Polysporin [bacitracin-polymyxin b], Cephalosporins, Ketoconazole, Neo-bacit-poly-lidocaine , Tioconazole, Lisinopril , Mucinex [guaifenesin er], and Victoza [liraglutide]   Review  of Systems Review of Systems  Eyes:  Positive for redness and itching.     Physical Exam Triage Vital Signs ED Triage Vitals  Encounter Vitals Group     BP 01/01/24 1256 132/78     Girls Systolic BP Percentile --      Girls Diastolic BP Percentile --      Boys Systolic BP Percentile --      Boys Diastolic BP Percentile --      Pulse Rate 01/01/24 1256 64     Resp 01/01/24 1256 17     Temp 01/01/24 1256 98.4 F (36.9 C)     Temp Source 01/01/24 1256 Oral     SpO2 01/01/24 1256 96 %     Weight --      Height --      Head Circumference --      Peak Flow --      Pain Score 01/01/24 1252 5     Pain Loc --      Pain Education --      Exclude from Growth Chart --    No data found.  Updated Vital Signs BP 132/78   Pulse 64   Temp 98.4 F (36.9 C) (Oral)   Resp 17   SpO2 96%   Visual Acuity Right Eye Distance:   Left Eye Distance:   Bilateral Distance:    Right Eye Near:   Left Eye Near:    Bilateral Near:     Physical Exam Vitals and nursing note reviewed.  Constitutional:      General: She is not in acute distress.    Appearance: Normal appearance. She is not ill-appearing.  HENT:     Head: Normocephalic and atraumatic.  Eyes:     General: Lids are normal.        Right eye: No foreign body, discharge or hordeolum.        Left eye: No foreign body, discharge or hordeolum.     Conjunctiva/sclera:     Right eye: Right conjunctiva is injected. No chemosis, exudate or hemorrhage.    Left eye: Left conjunctiva is not injected. No chemosis, exudate or  hemorrhage.    Pupils: Pupils are equal, round, and reactive to light.     Comments: Very mild concern without  Cardiovascular:     Rate and Rhythm: Normal rate.  Pulmonary:     Effort: Pulmonary effort is normal.  Skin:    General: Skin is warm and dry.  Neurological:     General: No focal deficit present.     Mental Status: She is alert and oriented to person, place, and time.  Psychiatric:        Mood and  Affect: Mood normal.        Behavior: Behavior normal.      UC Treatments / Results  Labs (all labs ordered are listed, but only abnormal results are displayed) Labs Reviewed - No data to display  EKG   Radiology No results found.  Procedures Procedures (including critical care time)  Medications Ordered in UC Medications - No data to display  Initial Impression / Assessment and Plan / UC Course  I have reviewed the triage vital signs and the nursing notes.  Pertinent labs & imaging results that were available during my care of the patient were reviewed by me and considered in my medical decision making (see chart for details).     I reviewed exam and symptoms with patient.  No red flags.  Discussed allergic conjunctivitis, start antihistamine eyedrops and OTC Zyrtec.  Advise warm compresses and PCP or ophthalmology follow-up if symptoms do not improve.  ER precautions reviewed. Final Clinical Impressions(s) / UC Diagnoses   Final diagnoses:  Allergic conjunctivitis of right eye     Discharge Instructions      Start antihistamine eyedrops twice daily as prescribed.  Warm compresses to your eyes as needed.  I would also restart your Zyrtec allergy medicine daily.  Please follow-up with your PCP or ophthalmologist if your symptoms do not improve.  Please go to the ER for any worsening symptoms.  Hope you feel better soon!    ED Prescriptions     Medication Sig Dispense Auth. Provider   azelastine  (OPTIVAR ) 0.05 % ophthalmic solution Place 1 drop into the right eye 2 (two) times daily for 7 days. 6 mL Rhylee Nunn, Jodi R, NP      PDMP not reviewed this encounter.   Loreda Myla SAUNDERS, NP 01/01/24 1308

## 2024-01-01 NOTE — Discharge Instructions (Addendum)
 Start antihistamine eyedrops twice daily as prescribed.  Warm compresses to your eyes as needed.  I would also restart your Zyrtec allergy medicine daily.  Please follow-up with your PCP or ophthalmologist if your symptoms do not improve.  Please go to the ER for any worsening symptoms.  Hope you feel better soon!

## 2024-01-01 NOTE — ED Triage Notes (Signed)
 Pt c/o eye reddness and pain bilat. Pt states the right eye started on Saturday then moved to left eye last night. Pt denies vision changes. Pt's eye lids are erythematous bilat

## 2024-01-17 DIAGNOSIS — N3281 Overactive bladder: Secondary | ICD-10-CM | POA: Diagnosis not present

## 2024-01-17 DIAGNOSIS — N3941 Urge incontinence: Secondary | ICD-10-CM | POA: Diagnosis not present

## 2024-01-17 DIAGNOSIS — N1831 Chronic kidney disease, stage 3a: Secondary | ICD-10-CM | POA: Diagnosis not present

## 2024-01-17 DIAGNOSIS — R300 Dysuria: Secondary | ICD-10-CM | POA: Diagnosis not present

## 2024-02-06 ENCOUNTER — Emergency Department (HOSPITAL_COMMUNITY)
Admission: EM | Admit: 2024-02-06 | Discharge: 2024-02-07 | Disposition: A | Discharge status: Home / Self Care | Attending: Emergency Medicine | Admitting: Emergency Medicine

## 2024-02-06 ENCOUNTER — Other Ambulatory Visit: Payer: Self-pay

## 2024-02-06 ENCOUNTER — Emergency Department (HOSPITAL_COMMUNITY)

## 2024-02-06 ENCOUNTER — Encounter (HOSPITAL_COMMUNITY): Payer: Self-pay

## 2024-02-06 DIAGNOSIS — W01198A Fall on same level from slipping, tripping and stumbling with subsequent striking against other object, initial encounter: Secondary | ICD-10-CM | POA: Insufficient documentation

## 2024-02-06 DIAGNOSIS — E1122 Type 2 diabetes mellitus with diabetic chronic kidney disease: Secondary | ICD-10-CM | POA: Insufficient documentation

## 2024-02-06 DIAGNOSIS — S299XXA Unspecified injury of thorax, initial encounter: Secondary | ICD-10-CM | POA: Diagnosis not present

## 2024-02-06 DIAGNOSIS — S20219A Contusion of unspecified front wall of thorax, initial encounter: Secondary | ICD-10-CM | POA: Insufficient documentation

## 2024-02-06 DIAGNOSIS — Z79899 Other long term (current) drug therapy: Secondary | ICD-10-CM | POA: Diagnosis not present

## 2024-02-06 DIAGNOSIS — S0003XA Contusion of scalp, initial encounter: Secondary | ICD-10-CM | POA: Diagnosis not present

## 2024-02-06 DIAGNOSIS — E039 Hypothyroidism, unspecified: Secondary | ICD-10-CM | POA: Diagnosis not present

## 2024-02-06 DIAGNOSIS — S0990XA Unspecified injury of head, initial encounter: Secondary | ICD-10-CM | POA: Diagnosis present

## 2024-02-06 DIAGNOSIS — R296 Repeated falls: Secondary | ICD-10-CM | POA: Insufficient documentation

## 2024-02-06 DIAGNOSIS — W19XXXA Unspecified fall, initial encounter: Secondary | ICD-10-CM | POA: Diagnosis not present

## 2024-02-06 DIAGNOSIS — N3000 Acute cystitis without hematuria: Secondary | ICD-10-CM | POA: Insufficient documentation

## 2024-02-06 DIAGNOSIS — R079 Chest pain, unspecified: Secondary | ICD-10-CM | POA: Diagnosis not present

## 2024-02-06 DIAGNOSIS — N189 Chronic kidney disease, unspecified: Secondary | ICD-10-CM | POA: Diagnosis not present

## 2024-02-06 DIAGNOSIS — S301XXA Contusion of abdominal wall, initial encounter: Secondary | ICD-10-CM | POA: Insufficient documentation

## 2024-02-06 DIAGNOSIS — S3993XA Unspecified injury of pelvis, initial encounter: Secondary | ICD-10-CM | POA: Diagnosis not present

## 2024-02-06 DIAGNOSIS — R0781 Pleurodynia: Secondary | ICD-10-CM | POA: Diagnosis not present

## 2024-02-06 DIAGNOSIS — N644 Mastodynia: Secondary | ICD-10-CM | POA: Diagnosis not present

## 2024-02-06 DIAGNOSIS — I10 Essential (primary) hypertension: Secondary | ICD-10-CM | POA: Diagnosis not present

## 2024-02-06 DIAGNOSIS — S3991XA Unspecified injury of abdomen, initial encounter: Secondary | ICD-10-CM | POA: Diagnosis not present

## 2024-02-06 LAB — CBC WITH DIFFERENTIAL/PLATELET
Abs Immature Granulocytes: 0.03 K/uL (ref 0.00–0.07)
Basophils Absolute: 0.1 K/uL (ref 0.0–0.1)
Basophils Relative: 1 %
Eosinophils Absolute: 0.2 K/uL (ref 0.0–0.5)
Eosinophils Relative: 2 %
HCT: 35.5 % — ABNORMAL LOW (ref 36.0–46.0)
Hemoglobin: 12.1 g/dL (ref 12.0–15.0)
Immature Granulocytes: 0 %
Lymphocytes Relative: 22 %
Lymphs Abs: 1.9 K/uL (ref 0.7–4.0)
MCH: 30.6 pg (ref 26.0–34.0)
MCHC: 34.1 g/dL (ref 30.0–36.0)
MCV: 89.6 fL (ref 80.0–100.0)
Monocytes Absolute: 0.7 K/uL (ref 0.1–1.0)
Monocytes Relative: 8 %
Neutro Abs: 5.8 K/uL (ref 1.7–7.7)
Neutrophils Relative %: 67 %
Platelets: 268 K/uL (ref 150–400)
RBC: 3.96 MIL/uL (ref 3.87–5.11)
RDW: 12.3 % (ref 11.5–15.5)
WBC: 8.6 K/uL (ref 4.0–10.5)
nRBC: 0 % (ref 0.0–0.2)

## 2024-02-06 LAB — BASIC METABOLIC PANEL WITH GFR
Anion gap: 14 (ref 5–15)
BUN: 26 mg/dL — ABNORMAL HIGH (ref 8–23)
CO2: 26 mmol/L (ref 22–32)
Calcium: 9.8 mg/dL (ref 8.9–10.3)
Chloride: 96 mmol/L — ABNORMAL LOW (ref 98–111)
Creatinine, Ser: 1.62 mg/dL — ABNORMAL HIGH (ref 0.44–1.00)
GFR, Estimated: 31 mL/min — ABNORMAL LOW (ref >60)
Glucose, Bld: 247 mg/dL — ABNORMAL HIGH (ref 70–99)
Potassium: 2.9 mmol/L — ABNORMAL LOW (ref 3.5–5.1)
Sodium: 136 mmol/L (ref 135–145)

## 2024-02-06 MED ORDER — OXYCODONE-ACETAMINOPHEN 5-325 MG PO TABS
1.0000 | ORAL_TABLET | Freq: Once | ORAL | Status: AC
  Administered 2024-02-06: 1 via ORAL
  Filled 2024-02-06: qty 1

## 2024-02-06 NOTE — ED Triage Notes (Signed)
 Pt BIB EMS from [facility]. Pt had a fall Saturday night landing on right flank. Bruising and difficulty breathing. Pt is using a walker due to pain.

## 2024-02-06 NOTE — ED Provider Notes (Signed)
 [place] EMERGENCY DEPARTMENT AT [facility] Provider Note   CSN: [id number] Arrival date & time: 02/06/24  1814     Patient presents with: Abigail [name]   EVELYNA FOLKER is a 83 y.o. female.  {Add pertinent medical, surgical, social history, OB history to YEP:67052} The history is provided by the patient and medical records.  Fall Associated symptoms include chest pain and abdominal pain.   83 year old female with history of chronic kidney disease, diabetes, fibromyalgia, diabetic nephropathy, hyperlipidemia, hypothyroidism, presenting to the ED after multiple falls over the past few days.  Initially fell on Saturday, states she does not entirely remember how she fell but she fell onto her right side.  States she was evaluated at her facility and was told that she would likely be sore but did not have any imaging done.  States she fell again this morning while simply trying to get out of bed.  Normally she is able to do this without any difficulty.  She does admit to feeling somewhat generally weak.  She has hit her head x2.  Not currently on anticoagulation.  No recent changes in medications.  States pain is getting worse, now having to use walker as she cannot stand upright.  No meds given at facility PTA.  Prior to Admission medications   Medication Sig Start Date End Date Taking? Authorizing Provider  acetaminophen  (TYLENOL ) 500 MG tablet Take 1,000 mg by mouth every 6 (six) hours as needed for moderate pain or headache.    [provider]  amitriptyline  (ELAVIL ) 10 MG tablet Take 20 mg by mouth at bedtime.     [provider]  amLODipine  (NORVASC ) 5 MG tablet Take 5 mg by mouth daily.     [provider]  aspirin  81 MG chewable tablet Chew by mouth daily.    [provider]  B Complex-C (B-COMPLEX WITH VITAMIN C) tablet Take 1 tablet by mouth every evening.    [provider]  carvedilol  (COREG ) 6.25 MG tablet Take 6.25 mg by mouth 2  (two) times daily.     [provider]  Cholecalciferol (VITAMIN D3) 1.25 MG (50000 UT) CAPS Take by mouth.    [provider]  Cyanocobalamin  (VITAMIN B-12 IJ) Inject 1,000 mg as directed every 28 (twenty-eight) days.     [provider]  DULoxetine  (CYMBALTA ) 60 MG capsule Take 60 mg by mouth 2 (two) times daily.    [provider]  fluticasone  (FLONASE ) 50 MCG/ACT nasal spray Place 1-2 sprays into the nose daily as needed for allergies.     [provider]  gabapentin  (NEURONTIN ) 400 MG capsule Take 1 capsule (400 mg total) by mouth 3 (three) times daily. Patient taking differently: Take 400-800 mg by mouth See admin instructions. Take 400 mg in the morning, 400 mg at lunch, and 800 mg at supper 02/06/13   Fairy Frames, MD  LEVEMIR  FLEXTOUCH 100 UNIT/ML Pen Inject 53 Units into the skin every morning.  07/25/14   [provider]  levothyroxine  (SYNTHROID ) 175 MCG tablet Take 175 mcg by mouth daily before breakfast.    [provider]  losartan -hydrochlorothiazide  (HYZAAR) 100-25 MG per tablet Take 1 tablet by mouth daily. For Hypertension    [provider]  Melatonin 3 MG CAPS Take 3 mg by mouth at bedtime.     [provider]  mometasone  (ELOCON ) 0.1 % cream Apply 1 application topically daily as needed (rash).    [provider]  Multiple  Vitamin (MULTIVITAMIN) tablet Take 1 tablet by mouth every evening.     [provider]  mupirocin  ointment (BACTROBAN ) 2 % Apply 1 application topically daily as needed (wound care).     [provider]  polyethylene glycol (MIRALAX  / GLYCOLAX ) packet Take 17 g by mouth daily as needed for mild constipation.     [provider]  Polyvinyl Alcohol-Povidone (REFRESH OP) Place 1 drop into both eyes daily as needed (dry eyes).    [provider]  pravastatin  (PRAVACHOL ) 80 MG tablet Take 80 mg by mouth daily.    [provider]   Probiotic Product (PROBIOTIC DAILY PO) Take 1 capsule by mouth daily.    [provider]  tiZANidine  (ZANAFLEX ) 4 MG capsule Take 4 mg by mouth at bedtime as needed for muscle spasms.     [provider]  tolterodine (DETROL) 2 MG tablet Take 2 mg by mouth 2 (two) times daily.    [provider]  traMADol  (ULTRAM ) 50 MG tablet Take 1 tablet (50 mg total) by mouth every 6 (six) hours as needed. 02/26/22   Maranda Jamee Jacob, MD    Allergies: Nsaids, Onglyza [saxagliptin], Penicillins, Polysporin [bacitracin-polymyxin b], Cephalosporins, Ketoconazole, Neo-bacit-poly-lidocaine , Tioconazole, Lisinopril , Mucinex [guaifenesin er], and Victoza [liraglutide]    Review of Systems  Cardiovascular:  Positive for chest pain.  Gastrointestinal:  Positive for abdominal pain.  Musculoskeletal:  Positive for arthralgias.  All other systems reviewed and are negative.   Updated Vital Signs BP (!) 165/66   Pulse 63   Temp 98.2 F (36.8 C) (Oral)   Resp 14   Ht 5' 3 (1.6 m)   Wt 86.2 kg   SpO2 100%   BMI 33.66 kg/m   Physical Exam Vitals and nursing note reviewed.  Constitutional:      Appearance: She is well-developed.  HENT:     Head: Normocephalic and atraumatic.     Comments: Contusion noted to occipital scalp, no open wound/laceration present Eyes:     Conjunctiva/sclera: Conjunctivae normal.     Pupils: Pupils are equal, round, and reactive to light.  Cardiovascular:     Rate and Rhythm: Normal rate and regular rhythm.     Heart sounds: Normal heart sounds.  Pulmonary:     Effort: Pulmonary effort is normal.     Breath sounds: Normal breath sounds.  Chest:     Comments: Bruising noted along lower ribs and into right flank, locally tender Abdominal:     General: Bowel sounds are normal.     Palpations: Abdomen is soft.     Tenderness: There is abdominal tenderness in the right upper quadrant.     Comments: Tender in RUQ, some bruising extending from this  area into right flank, not peritoneal  Musculoskeletal:        General: Normal range of motion.     Cervical back: Normal range of motion.  Skin:    General: Skin is warm and dry.  Neurological:     Mental Status: She is alert and oriented to person, place, and time.     (all labs ordered are listed, but only abnormal results are displayed) Labs Reviewed  CBC WITH DIFFERENTIAL/PLATELET  BASIC METABOLIC PANEL WITH GFR  URINALYSIS, W/ REFLEX TO CULTURE (INFECTION SUSPECTED)    EKG: None  Radiology: DG Ribs Unilateral W/Chest Right Result Date: 02/06/2024 CLINICAL DATA:  Fall, rib pain EXAM: RIGHT RIBS AND CHEST - 3+ VIEW COMPARISON:  08/12/2020 FINDINGS: No fracture or other  bone lesions are seen involving the ribs. There is no evidence of pneumothorax or pleural effusion. Both lungs are clear. Heart size and mediastinal contours are within normal limits. Bilateral shoulder replacements noted. IMPRESSION: No acute findings. Electronically Signed   By: Franky Crease M.D.   On: 02/06/2024 19:26    {Document cardiac monitor, telemetry assessment procedure when appropriate:32947} Procedures   Medications Ordered in the ED  oxyCODONE -acetaminophen  (PERCOCET/ROXICET) 5-325 MG per tablet 1 tablet (has no administration in time range)      {Click here for ABCD2, HEART and other calculators REFRESH Note before signing:1}                              Medical Decision Making Amount and/or Complexity of Data Reviewed Labs: ordered. Radiology: ordered.  Risk Prescription drug management.   ***  {Document critical care time when appropriate  Document review of labs and clinical decision tools ie CHADS2VASC2, etc  Document your independent review of radiology images and any outside records  Document your discussion with family members, caretakers and with consultants  Document social determinants of health affecting pt's care  Document your decision making why or why not admission,  treatments were needed:32947:::1}   Final diagnoses:  None    ED Discharge Orders     None

## 2024-02-07 ENCOUNTER — Emergency Department (HOSPITAL_COMMUNITY)

## 2024-02-07 DIAGNOSIS — S3993XA Unspecified injury of pelvis, initial encounter: Secondary | ICD-10-CM | POA: Diagnosis not present

## 2024-02-07 DIAGNOSIS — G319 Degenerative disease of nervous system, unspecified: Secondary | ICD-10-CM | POA: Diagnosis not present

## 2024-02-07 DIAGNOSIS — S0003XA Contusion of scalp, initial encounter: Secondary | ICD-10-CM | POA: Diagnosis not present

## 2024-02-07 DIAGNOSIS — S199XXA Unspecified injury of neck, initial encounter: Secondary | ICD-10-CM | POA: Diagnosis not present

## 2024-02-07 DIAGNOSIS — S0990XA Unspecified injury of head, initial encounter: Secondary | ICD-10-CM | POA: Diagnosis not present

## 2024-02-07 DIAGNOSIS — I6782 Cerebral ischemia: Secondary | ICD-10-CM | POA: Diagnosis not present

## 2024-02-07 DIAGNOSIS — Z743 Need for continuous supervision: Secondary | ICD-10-CM | POA: Diagnosis not present

## 2024-02-07 DIAGNOSIS — S299XXA Unspecified injury of thorax, initial encounter: Secondary | ICD-10-CM | POA: Diagnosis not present

## 2024-02-07 DIAGNOSIS — S3991XA Unspecified injury of abdomen, initial encounter: Secondary | ICD-10-CM | POA: Diagnosis not present

## 2024-02-07 DIAGNOSIS — I10 Essential (primary) hypertension: Secondary | ICD-10-CM | POA: Diagnosis not present

## 2024-02-07 LAB — URINALYSIS, W/ REFLEX TO CULTURE (INFECTION SUSPECTED)
Bilirubin Urine: NEGATIVE
Glucose, UA: NEGATIVE mg/dL
Hgb urine dipstick: NEGATIVE
Ketones, ur: NEGATIVE mg/dL
Nitrite: POSITIVE — AB
Protein, ur: 100 mg/dL — AB
Specific Gravity, Urine: 1.014 (ref 1.005–1.030)
WBC, UA: >50 WBC/hpf (ref 0–5)
pH: 6 (ref 5.0–8.0)

## 2024-02-07 MED ORDER — POTASSIUM CHLORIDE CRYS ER 20 MEQ PO TBCR
40.0000 meq | EXTENDED_RELEASE_TABLET | Freq: Once | ORAL | Status: AC
  Administered 2024-02-07: 40 meq via ORAL
  Filled 2024-02-07: qty 2

## 2024-02-07 MED ORDER — NITROFURANTOIN MONOHYD MACRO 100 MG PO CAPS: 100.0000 mg | ORAL_CAPSULE | Freq: Two times a day (BID) | ORAL | 0 refills | Status: AC

## 2024-02-07 MED ORDER — NITROFURANTOIN MONOHYD MACRO 100 MG PO CAPS
100.0000 mg | ORAL_CAPSULE | Freq: Once | ORAL | Status: AC
  Administered 2024-02-07: 100 mg via ORAL
  Filled 2024-02-07: qty 1

## 2024-02-07 MED ORDER — CARVEDILOL 3.125 MG PO TABS
6.2500 mg | ORAL_TABLET | Freq: Once | ORAL | Status: AC
  Administered 2024-02-07: 6.25 mg via ORAL
  Filled 2024-02-07: qty 2

## 2024-02-07 NOTE — ED Notes (Signed)
 [facility] called

## 2024-02-07 NOTE — Discharge Instructions (Signed)
Take the prescribed medication as directed. °Follow-up with your primary care doctor. °Return to the ED for new or worsening symptoms. °

## 2024-02-09 LAB — URINE CULTURE: Culture: >=100000 — AB

## 2024-02-10 ENCOUNTER — Telehealth (HOSPITAL_BASED_OUTPATIENT_CLINIC_OR_DEPARTMENT_OTHER): Payer: Self-pay | Admitting: *Deleted

## 2024-02-10 NOTE — Telephone Encounter (Signed)
 Post ED Visit - Positive Culture Follow-up  Culture report reviewed by antimicrobial stewardship pharmacist: [facility] []  [address], Pharm.D. []  Venetia Gully, Pharm.D., BCPS AQ-ID []  Garrel Crews, Pharm.D., BCPS []  Almarie Lunger, Pharm.D., BCPS []  [place], [address].D., BCPS, AAHIVP []  Rosaline Bihari, Pharm.D., BCPS, AAHIVP []  Vernell Meier, PharmD, BCPS []  Latanya Hint, PharmD, BCPS []  Donald Medley, PharmD, BCPS []  Rocky Bold, PharmD []  Dorothyann Alert, PharmD, BCPS []  Morene Babe, PharmD  [facility] []  Rosaline Edison, PharmD []  Romona Bliss, PharmD []  Dolphus Roller, PharmD []  Veva Seip, Rph []  Vernell Daunt) Leonce, PharmD []  Eva Allis, PharmD []  Rosaline Millet, PharmD []  Iantha Batch, PharmD []  Arvin Gauss, PharmD []  Wanda Hasting, PharmD []  Ronal Rav, PharmD []  Rocky Slade, PharmD [x]  Lacinda Moats, PharmD   Positive urine culture Treated with Nitrofurantoin . Plan: Continue Current regimen per Curtistine Dawn, MD  Albino Alan Novak 02/10/2024, 9:21 AM

## 2024-02-13 ENCOUNTER — Emergency Department (HOSPITAL_COMMUNITY)
Admission: EM | Admit: 2024-02-13 | Discharge: 2024-02-13 | Disposition: A | Discharge status: Home / Self Care | Source: Skilled Nursing Facility | Attending: Emergency Medicine | Admitting: Emergency Medicine

## 2024-02-13 ENCOUNTER — Emergency Department (HOSPITAL_COMMUNITY)

## 2024-02-13 ENCOUNTER — Encounter (HOSPITAL_COMMUNITY): Payer: Self-pay

## 2024-02-13 ENCOUNTER — Other Ambulatory Visit: Payer: Self-pay

## 2024-02-13 DIAGNOSIS — M25561 Pain in right knee: Secondary | ICD-10-CM | POA: Diagnosis not present

## 2024-02-13 DIAGNOSIS — M25462 Effusion, left knee: Secondary | ICD-10-CM | POA: Insufficient documentation

## 2024-02-13 DIAGNOSIS — E039 Hypothyroidism, unspecified: Secondary | ICD-10-CM | POA: Insufficient documentation

## 2024-02-13 DIAGNOSIS — S80919A Unspecified superficial injury of unspecified knee, initial encounter: Secondary | ICD-10-CM | POA: Diagnosis not present

## 2024-02-13 DIAGNOSIS — Z981 Arthrodesis status: Secondary | ICD-10-CM | POA: Diagnosis not present

## 2024-02-13 DIAGNOSIS — M1711 Unilateral primary osteoarthritis, right knee: Secondary | ICD-10-CM | POA: Diagnosis not present

## 2024-02-13 DIAGNOSIS — M4802 Spinal stenosis, cervical region: Secondary | ICD-10-CM | POA: Diagnosis not present

## 2024-02-13 DIAGNOSIS — M542 Cervicalgia: Secondary | ICD-10-CM | POA: Diagnosis present

## 2024-02-13 DIAGNOSIS — E1122 Type 2 diabetes mellitus with diabetic chronic kidney disease: Secondary | ICD-10-CM | POA: Diagnosis not present

## 2024-02-13 DIAGNOSIS — Z79899 Other long term (current) drug therapy: Secondary | ICD-10-CM | POA: Diagnosis not present

## 2024-02-13 DIAGNOSIS — W1811XA Fall from or off toilet without subsequent striking against object, initial encounter: Secondary | ICD-10-CM | POA: Diagnosis not present

## 2024-02-13 DIAGNOSIS — R519 Headache, unspecified: Secondary | ICD-10-CM | POA: Diagnosis not present

## 2024-02-13 DIAGNOSIS — N189 Chronic kidney disease, unspecified: Secondary | ICD-10-CM | POA: Insufficient documentation

## 2024-02-13 DIAGNOSIS — M25562 Pain in left knee: Secondary | ICD-10-CM | POA: Insufficient documentation

## 2024-02-13 DIAGNOSIS — Z4789 Encounter for other orthopedic aftercare: Secondary | ICD-10-CM | POA: Diagnosis not present

## 2024-02-13 DIAGNOSIS — I10 Essential (primary) hypertension: Secondary | ICD-10-CM | POA: Diagnosis not present

## 2024-02-13 DIAGNOSIS — R9082 White matter disease, unspecified: Secondary | ICD-10-CM | POA: Diagnosis not present

## 2024-02-13 DIAGNOSIS — W19XXXA Unspecified fall, initial encounter: Secondary | ICD-10-CM | POA: Diagnosis not present

## 2024-02-13 DIAGNOSIS — M47816 Spondylosis without myelopathy or radiculopathy, lumbar region: Secondary | ICD-10-CM | POA: Diagnosis not present

## 2024-02-13 DIAGNOSIS — M549 Dorsalgia, unspecified: Secondary | ICD-10-CM | POA: Diagnosis not present

## 2024-02-13 DIAGNOSIS — S0990XA Unspecified injury of head, initial encounter: Secondary | ICD-10-CM | POA: Diagnosis not present

## 2024-02-13 DIAGNOSIS — M47812 Spondylosis without myelopathy or radiculopathy, cervical region: Secondary | ICD-10-CM | POA: Diagnosis not present

## 2024-02-13 LAB — CBC
HCT: 40.3 % (ref 36.0–46.0)
Hemoglobin: 13.1 g/dL (ref 12.0–15.0)
MCH: 29.5 pg (ref 26.0–34.0)
MCHC: 32.5 g/dL (ref 30.0–36.0)
MCV: 90.8 fL (ref 80.0–100.0)
Platelets: 297 K/uL (ref 150–400)
RBC: 4.44 MIL/uL (ref 3.87–5.11)
RDW: 12.6 % (ref 11.5–15.5)
WBC: 8.1 K/uL (ref 4.0–10.5)
nRBC: 0 % (ref 0.0–0.2)

## 2024-02-13 LAB — COMPREHENSIVE METABOLIC PANEL WITH GFR
ALT: 10 U/L (ref 0–44)
AST: 17 U/L (ref 15–41)
Albumin: 4.1 g/dL (ref 3.5–5.0)
Alkaline Phosphatase: 109 U/L (ref 38–126)
Anion gap: 13 (ref 5–15)
BUN: 19 mg/dL (ref 8–23)
CO2: 27 mmol/L (ref 22–32)
Calcium: 10.3 mg/dL (ref 8.9–10.3)
Chloride: 100 mmol/L (ref 98–111)
Creatinine, Ser: 1.44 mg/dL — ABNORMAL HIGH (ref 0.44–1.00)
GFR, Estimated: 36 mL/min — ABNORMAL LOW (ref >60)
Glucose, Bld: 132 mg/dL — ABNORMAL HIGH (ref 70–99)
Potassium: 3.3 mmol/L — ABNORMAL LOW (ref 3.5–5.1)
Sodium: 140 mmol/L (ref 135–145)
Total Bilirubin: 0.5 mg/dL (ref 0.0–1.2)
Total Protein: 6.5 g/dL (ref 6.5–8.1)

## 2024-02-13 LAB — URINALYSIS, ROUTINE W REFLEX MICROSCOPIC
Bilirubin Urine: NEGATIVE
Glucose, UA: NEGATIVE mg/dL
Hgb urine dipstick: NEGATIVE
Ketones, ur: NEGATIVE mg/dL
Leukocytes,Ua: NEGATIVE
Nitrite: NEGATIVE
Protein, ur: 30 mg/dL — AB
Specific Gravity, Urine: 1.012 (ref 1.005–1.030)
pH: 7 (ref 5.0–8.0)

## 2024-02-13 MED ORDER — ACETAMINOPHEN 500 MG PO TABS
1000.0000 mg | ORAL_TABLET | Freq: Once | ORAL | Status: AC
  Administered 2024-02-13: 1000 mg via ORAL
  Filled 2024-02-13: qty 2

## 2024-02-13 NOTE — ED Notes (Addendum)
 Patients family is taking patient home.

## 2024-02-13 NOTE — ED Provider Notes (Signed)
 [place] EMERGENCY DEPARTMENT AT [facility] Provider Note   CSN: [id number] Arrival date & time: 02/13/24  9153     Patient presents with: Fall, Knee Injury, and Abdominal Pain   Abigail ARSENEAU is a 83 y.o. female.  {Add pertinent medical, surgical, social history, OB history to YEP:67052}  Fall Associated symptoms include abdominal pain.  Abdominal Pain  Patient is an 83 year old female with past medical history significant for CKD, fibromyalgia, DM2, HLD, hypothyroidism  Patient is a resident of [address] independent living who has had multiple falls over the past year.  Today she states that she went to the restroom and attempted to stand up off the toilet and fell forward.  She states that she injured her right knee but does not complaining of any other significant pain apart from some neck back and head pain.  Denies any fevers or chills.         Prior to Admission medications   Medication Sig Start Date End Date Taking? Authorizing Provider  acetaminophen  (TYLENOL ) 500 MG tablet Take 1,000 mg by mouth every 6 (six) hours as needed for moderate pain or headache.    [provider]  amitriptyline  (ELAVIL ) 10 MG tablet Take 20 mg by mouth at bedtime.     [provider]  amLODipine  (NORVASC ) 5 MG tablet Take 5 mg by mouth daily.     [provider]  aspirin  81 MG chewable tablet Chew by mouth daily.    [provider]  B Complex-C (B-COMPLEX WITH VITAMIN C) tablet Take 1 tablet by mouth every evening.    [provider]  carvedilol  (COREG ) 6.25 MG tablet Take 6.25 mg by mouth 2 (two) times daily.     [provider]  Cholecalciferol (VITAMIN D3) 1.25 MG (50000 UT) CAPS Take by mouth.    [provider]  Cyanocobalamin  (VITAMIN B-12 IJ) Inject 1,000 mg as directed every 28 (twenty-eight) days.     [provider]  DULoxetine  (CYMBALTA ) 60 MG capsule Take 60 mg by mouth 2 (two) times  daily.    [provider]  fluticasone  (FLONASE ) 50 MCG/ACT nasal spray Place 1-2 sprays into the nose daily as needed for allergies.     [provider]  gabapentin  (NEURONTIN ) 400 MG capsule Take 1 capsule (400 mg total) by mouth 3 (three) times daily. Patient taking differently: Take 400-800 mg by mouth See admin instructions. Take 400 mg in the morning, 400 mg at lunch, and 800 mg at supper 02/06/13   Fairy Frames, MD  LEVEMIR  FLEXTOUCH 100 UNIT/ML Pen Inject 53 Units into the skin every morning.  07/25/14   [provider]  levothyroxine  (SYNTHROID ) 175 MCG tablet Take 175 mcg by mouth daily before breakfast.    [provider]  losartan -hydrochlorothiazide  (HYZAAR) 100-25 MG per tablet Take 1 tablet by mouth daily. For Hypertension    [provider]  Melatonin 3 MG CAPS Take 3 mg by mouth at bedtime.     [provider]  mometasone  (ELOCON ) 0.1 % cream Apply 1 application topically daily as needed (rash).    [provider]  Multiple Vitamin (MULTIVITAMIN) tablet Take 1 tablet by mouth every evening.     [provider]  mupirocin  ointment (BACTROBAN ) 2 % Apply 1 application topically daily as needed (wound care).     [provider]  nitrofurantoin , macrocrystal-monohydrate, (MACROBID ) 100 MG capsule Take 1 capsule (100 mg total) by mouth 2 (two) times daily.  02/07/24   Jarold Olam HERO, PA-C  polyethylene glycol (MIRALAX  / GLYCOLAX ) packet Take 17 g by mouth daily as needed for mild constipation.     [provider]  Polyvinyl Alcohol-Povidone (REFRESH OP) Place 1 drop into both eyes daily as needed (dry eyes).    [provider]  pravastatin  (PRAVACHOL ) 80 MG tablet Take 80 mg by mouth daily.    [provider]  Probiotic Product (PROBIOTIC DAILY PO) Take 1 capsule by mouth daily.    [provider]  tiZANidine  (ZANAFLEX ) 4 MG capsule Take 4 mg by mouth at bedtime as needed  for muscle spasms.     [provider]  tolterodine (DETROL) 2 MG tablet Take 2 mg by mouth 2 (two) times daily.    [provider]  traMADol  (ULTRAM ) 50 MG tablet Take 1 tablet (50 mg total) by mouth every 6 (six) hours as needed. 02/26/22   Maranda Jamee Jacob, MD    Allergies: Nsaids, Onglyza [saxagliptin], Penicillins, Polysporin [bacitracin-polymyxin b], Cephalosporins, Ketoconazole, Neo-bacit-poly-lidocaine , Tioconazole, Lisinopril , Mucinex [guaifenesin er], and Victoza [liraglutide]    Review of Systems  Gastrointestinal:  Positive for abdominal pain.    Updated Vital Signs BP (!) 195/79 (BP Location: Left Arm)   Pulse (!) 56   Temp (!) 97.5 F (36.4 C) (Oral)   Resp 19   SpO2 100%   Physical Exam  (all labs ordered are listed, but only abnormal results are displayed) Labs Reviewed  URINALYSIS, ROUTINE W REFLEX MICROSCOPIC  COMPREHENSIVE METABOLIC PANEL WITH GFR  CBC    EKG: None  Radiology: No results found.  {Document cardiac monitor, telemetry assessment procedure when appropriate:32947} Procedures   Medications Ordered in the ED  acetaminophen  (TYLENOL ) tablet 1,000 mg (has no administration in time range)      {Click here for ABCD2, HEART and other calculators REFRESH Note before signing:1}                              Medical Decision Making Amount and/or Complexity of Data Reviewed Labs: ordered. Radiology: ordered.  Risk OTC drugs.   ***  {Document critical care time when appropriate  Document review of labs and clinical decision tools ie CHADS2VASC2, etc  Document your independent review of radiology images and any outside records  Document your discussion with family members, caretakers and with consultants  Document social determinants of health affecting pt's care  Document your decision making why or why not admission, treatments were needed:32947:::1}   Final diagnoses:  None    ED Discharge Orders     None

## 2024-02-13 NOTE — Progress Notes (Signed)
 Orthopedic Tech Progress Note Patient Details:  Abigail [name] 1940-12-19 [id number]  Ortho Devices Type of Ortho Device: Knee Sleeve Ortho Device/Splint Location: RLE Ortho Device/Splint Interventions: Ordered, Application, Adjustment   Post Interventions Patient Tolerated: Well Instructions Provided: Adjustment of device, Care of device  Abigail [name] 02/13/2024, 1:14 PM

## 2024-02-13 NOTE — ED Triage Notes (Signed)
 Pt BIB EMS from [facility] due to fall. Pt fell on right side injuring right knee possible dislocation of knee cap. Pt c/o right knee pain and right abdominal pain. No LOC, did not hit head, no blood thinner. VS within normal limit for EMS. Pt uses cane at home. CBG 113, DM2. Pt recurrently being treated for UTI.

## 2024-02-13 NOTE — Discharge Instructions (Addendum)
 Your imaging shows no fractures.  Your workup is reassuringly normal.   Please request PT and OT because you are having issues with weakness.

## 2024-02-15 DIAGNOSIS — E559 Vitamin D deficiency, unspecified: Secondary | ICD-10-CM | POA: Diagnosis not present

## 2024-02-15 DIAGNOSIS — E782 Mixed hyperlipidemia: Secondary | ICD-10-CM | POA: Diagnosis not present

## 2024-02-15 DIAGNOSIS — R531 Weakness: Secondary | ICD-10-CM | POA: Diagnosis not present

## 2024-02-15 DIAGNOSIS — M179 Osteoarthritis of knee, unspecified: Secondary | ICD-10-CM | POA: Diagnosis not present

## 2024-02-15 DIAGNOSIS — E039 Hypothyroidism, unspecified: Secondary | ICD-10-CM | POA: Diagnosis not present

## 2024-02-15 DIAGNOSIS — N390 Urinary tract infection, site not specified: Secondary | ICD-10-CM | POA: Diagnosis not present

## 2024-02-15 DIAGNOSIS — Z6839 Body mass index (BMI) 39.0-39.9, adult: Secondary | ICD-10-CM | POA: Diagnosis not present

## 2024-02-15 DIAGNOSIS — R296 Repeated falls: Secondary | ICD-10-CM | POA: Diagnosis not present

## 2024-02-15 DIAGNOSIS — E1140 Type 2 diabetes mellitus with diabetic neuropathy, unspecified: Secondary | ICD-10-CM | POA: Diagnosis not present

## 2024-02-15 DIAGNOSIS — E113293 Type 2 diabetes mellitus with mild nonproliferative diabetic retinopathy without macular edema, bilateral: Secondary | ICD-10-CM | POA: Diagnosis not present

## 2024-02-15 DIAGNOSIS — E538 Deficiency of other specified B group vitamins: Secondary | ICD-10-CM | POA: Diagnosis not present

## 2024-02-15 DIAGNOSIS — I10 Essential (primary) hypertension: Secondary | ICD-10-CM | POA: Diagnosis not present

## 2024-02-19 DIAGNOSIS — E113293 Type 2 diabetes mellitus with mild nonproliferative diabetic retinopathy without macular edema, bilateral: Secondary | ICD-10-CM | POA: Diagnosis not present

## 2024-02-23 DIAGNOSIS — R296 Repeated falls: Secondary | ICD-10-CM | POA: Diagnosis not present

## 2024-02-23 DIAGNOSIS — M6259 Muscle wasting and atrophy, not elsewhere classified, multiple sites: Secondary | ICD-10-CM | POA: Diagnosis not present

## 2024-02-23 DIAGNOSIS — R2681 Unsteadiness on feet: Secondary | ICD-10-CM | POA: Diagnosis not present

## 2024-02-26 DIAGNOSIS — M62511 Muscle wasting and atrophy, not elsewhere classified, right shoulder: Secondary | ICD-10-CM | POA: Diagnosis not present

## 2024-02-26 DIAGNOSIS — M62512 Muscle wasting and atrophy, not elsewhere classified, left shoulder: Secondary | ICD-10-CM | POA: Diagnosis not present

## 2024-02-28 DIAGNOSIS — R2681 Unsteadiness on feet: Secondary | ICD-10-CM | POA: Diagnosis not present

## 2024-02-28 DIAGNOSIS — M6259 Muscle wasting and atrophy, not elsewhere classified, multiple sites: Secondary | ICD-10-CM | POA: Diagnosis not present

## 2024-02-28 DIAGNOSIS — R296 Repeated falls: Secondary | ICD-10-CM | POA: Diagnosis not present

## 2024-02-29 DIAGNOSIS — M62512 Muscle wasting and atrophy, not elsewhere classified, left shoulder: Secondary | ICD-10-CM | POA: Diagnosis not present

## 2024-02-29 DIAGNOSIS — M62511 Muscle wasting and atrophy, not elsewhere classified, right shoulder: Secondary | ICD-10-CM | POA: Diagnosis not present

## 2024-03-01 DIAGNOSIS — R300 Dysuria: Secondary | ICD-10-CM | POA: Diagnosis not present

## 2024-03-01 DIAGNOSIS — W19XXXA Unspecified fall, initial encounter: Secondary | ICD-10-CM | POA: Diagnosis not present

## 2024-03-01 DIAGNOSIS — E782 Mixed hyperlipidemia: Secondary | ICD-10-CM | POA: Diagnosis not present

## 2024-03-01 DIAGNOSIS — H1010 Acute atopic conjunctivitis, unspecified eye: Secondary | ICD-10-CM | POA: Diagnosis not present

## 2024-03-01 DIAGNOSIS — N184 Chronic kidney disease, stage 4 (severe): Secondary | ICD-10-CM | POA: Diagnosis not present

## 2024-03-01 DIAGNOSIS — Z09 Encounter for follow-up examination after completed treatment for conditions other than malignant neoplasm: Secondary | ICD-10-CM | POA: Diagnosis not present

## 2024-03-01 DIAGNOSIS — Z6838 Body mass index (BMI) 38.0-38.9, adult: Secondary | ICD-10-CM | POA: Diagnosis not present

## 2024-03-04 DIAGNOSIS — M62511 Muscle wasting and atrophy, not elsewhere classified, right shoulder: Secondary | ICD-10-CM | POA: Diagnosis not present

## 2024-03-04 DIAGNOSIS — M62512 Muscle wasting and atrophy, not elsewhere classified, left shoulder: Secondary | ICD-10-CM | POA: Diagnosis not present

## 2024-03-05 DIAGNOSIS — R2681 Unsteadiness on feet: Secondary | ICD-10-CM | POA: Diagnosis not present

## 2024-03-05 DIAGNOSIS — R296 Repeated falls: Secondary | ICD-10-CM | POA: Diagnosis not present

## 2024-03-05 DIAGNOSIS — M6259 Muscle wasting and atrophy, not elsewhere classified, multiple sites: Secondary | ICD-10-CM | POA: Diagnosis not present

## 2024-03-07 ENCOUNTER — Emergency Department (HOSPITAL_COMMUNITY)

## 2024-03-07 ENCOUNTER — Encounter (HOSPITAL_COMMUNITY): Payer: Self-pay

## 2024-03-07 ENCOUNTER — Emergency Department (HOSPITAL_COMMUNITY)
Admission: EM | Admit: 2024-03-07 | Discharge: 2024-03-08 | Disposition: A | Discharge status: Home / Self Care | Attending: Emergency Medicine | Admitting: Emergency Medicine

## 2024-03-07 ENCOUNTER — Other Ambulatory Visit: Payer: Self-pay

## 2024-03-07 DIAGNOSIS — R296 Repeated falls: Secondary | ICD-10-CM | POA: Diagnosis not present

## 2024-03-07 DIAGNOSIS — Z96612 Presence of left artificial shoulder joint: Secondary | ICD-10-CM | POA: Diagnosis not present

## 2024-03-07 DIAGNOSIS — E876 Hypokalemia: Secondary | ICD-10-CM | POA: Insufficient documentation

## 2024-03-07 DIAGNOSIS — M79605 Pain in left leg: Secondary | ICD-10-CM | POA: Diagnosis not present

## 2024-03-07 DIAGNOSIS — Z7982 Long term (current) use of aspirin: Secondary | ICD-10-CM | POA: Diagnosis not present

## 2024-03-07 DIAGNOSIS — N189 Chronic kidney disease, unspecified: Secondary | ICD-10-CM | POA: Insufficient documentation

## 2024-03-07 DIAGNOSIS — M6259 Muscle wasting and atrophy, not elsewhere classified, multiple sites: Secondary | ICD-10-CM | POA: Diagnosis not present

## 2024-03-07 DIAGNOSIS — S199XXA Unspecified injury of neck, initial encounter: Secondary | ICD-10-CM | POA: Diagnosis not present

## 2024-03-07 DIAGNOSIS — I129 Hypertensive chronic kidney disease with stage 1 through stage 4 chronic kidney disease, or unspecified chronic kidney disease: Secondary | ICD-10-CM | POA: Insufficient documentation

## 2024-03-07 DIAGNOSIS — S8992XA Unspecified injury of left lower leg, initial encounter: Secondary | ICD-10-CM | POA: Diagnosis not present

## 2024-03-07 DIAGNOSIS — Z79899 Other long term (current) drug therapy: Secondary | ICD-10-CM | POA: Insufficient documentation

## 2024-03-07 DIAGNOSIS — W010XXA Fall on same level from slipping, tripping and stumbling without subsequent striking against object, initial encounter: Secondary | ICD-10-CM | POA: Insufficient documentation

## 2024-03-07 DIAGNOSIS — M25512 Pain in left shoulder: Secondary | ICD-10-CM | POA: Diagnosis not present

## 2024-03-07 DIAGNOSIS — E1122 Type 2 diabetes mellitus with diabetic chronic kidney disease: Secondary | ICD-10-CM | POA: Diagnosis not present

## 2024-03-07 DIAGNOSIS — W19XXXA Unspecified fall, initial encounter: Secondary | ICD-10-CM | POA: Diagnosis not present

## 2024-03-07 DIAGNOSIS — M25552 Pain in left hip: Secondary | ICD-10-CM | POA: Insufficient documentation

## 2024-03-07 DIAGNOSIS — N3000 Acute cystitis without hematuria: Secondary | ICD-10-CM | POA: Insufficient documentation

## 2024-03-07 DIAGNOSIS — S3993XA Unspecified injury of pelvis, initial encounter: Secondary | ICD-10-CM | POA: Diagnosis not present

## 2024-03-07 DIAGNOSIS — R2681 Unsteadiness on feet: Secondary | ICD-10-CM | POA: Diagnosis not present

## 2024-03-07 DIAGNOSIS — S0990XA Unspecified injury of head, initial encounter: Secondary | ICD-10-CM | POA: Diagnosis present

## 2024-03-07 DIAGNOSIS — I6523 Occlusion and stenosis of bilateral carotid arteries: Secondary | ICD-10-CM | POA: Diagnosis not present

## 2024-03-07 DIAGNOSIS — R300 Dysuria: Secondary | ICD-10-CM | POA: Diagnosis present

## 2024-03-07 LAB — BASIC METABOLIC PANEL WITH GFR
Anion gap: 10 (ref 5–15)
BUN: 24 mg/dL — ABNORMAL HIGH (ref 8–23)
CO2: 28 mmol/L (ref 22–32)
Calcium: 9.8 mg/dL (ref 8.9–10.3)
Chloride: 102 mmol/L (ref 98–111)
Creatinine, Ser: 1.44 mg/dL — ABNORMAL HIGH (ref 0.44–1.00)
GFR, Estimated: 36 mL/min — ABNORMAL LOW (ref >60)
Glucose, Bld: 111 mg/dL — ABNORMAL HIGH (ref 70–99)
Potassium: 3.2 mmol/L — ABNORMAL LOW (ref 3.5–5.1)
Sodium: 140 mmol/L (ref 135–145)

## 2024-03-07 LAB — URINALYSIS, ROUTINE W REFLEX MICROSCOPIC
Bilirubin Urine: NEGATIVE
Glucose, UA: NEGATIVE mg/dL
Hgb urine dipstick: NEGATIVE
Ketones, ur: NEGATIVE mg/dL
Nitrite: POSITIVE — AB
Protein, ur: 30 mg/dL — AB
Specific Gravity, Urine: 1.012 (ref 1.005–1.030)
WBC, UA: >50 WBC/hpf (ref 0–5)
pH: 6 (ref 5.0–8.0)

## 2024-03-07 LAB — CBC
HCT: 36.2 % (ref 36.0–46.0)
Hemoglobin: 11.7 g/dL — ABNORMAL LOW (ref 12.0–15.0)
MCH: 29.3 pg (ref 26.0–34.0)
MCHC: 32.3 g/dL (ref 30.0–36.0)
MCV: 90.7 fL (ref 80.0–100.0)
Platelets: 256 K/uL (ref 150–400)
RBC: 3.99 MIL/uL (ref 3.87–5.11)
RDW: 12.7 % (ref 11.5–15.5)
WBC: 7.7 K/uL (ref 4.0–10.5)
nRBC: 0 % (ref 0.0–0.2)

## 2024-03-07 MED ORDER — POTASSIUM CHLORIDE ER 10 MEQ PO TBCR
40.0000 meq | EXTENDED_RELEASE_TABLET | Freq: Once | ORAL | Status: AC
  Administered 2024-03-07: 40 meq via ORAL
  Filled 2024-03-07: qty 4

## 2024-03-07 MED ORDER — FOSFOMYCIN TROMETHAMINE 3 G PO PACK
3.0000 g | PACK | Freq: Once | ORAL | Status: AC
  Administered 2024-03-07: 3 g via ORAL
  Filled 2024-03-07: qty 3

## 2024-03-07 NOTE — Discharge Instructions (Signed)
 You were seen in the ER today for evaluation after your fall. We treated your UTI today with one time dosing. Please follow up with your PCP in the next few days for repeat testing. I have included some additional information into the discharge paperwork for you to review. Please make sure that you are using your walker to get around! If you have any concerns, new or worsening symptoms, please return to the nearest ER for re-evaluation.   Contact a health care provider if: Your symptoms don't get better after 1-2 days of taking antibiotics. Your symptoms go away and then come back. You have a fever or chills. You vomit or feel like you may vomit. Get help right away if: You have very bad pain in your back or lower belly. You faint.

## 2024-03-07 NOTE — ED Notes (Signed)
 Pt ambulatory short distance to bathroom without issue.

## 2024-03-07 NOTE — ED Provider Notes (Signed)
 [place] EMERGENCY DEPARTMENT AT [facility] Provider Note   CSN: [id number] Arrival date & time: 03/07/24  1633     Patient presents with: Fall and Hip Pain   Abigail [name] is a 83 y.o. female with h/o multiple falls, CKD, DM, HTN presents to the ER today for evaluation of a mechanical fall earlier today. She reports that she was dropping a urine sample off with her doctor's office and she was walking across the door threshold when the tennis balls on her walker started rolling up the rubber mat on the floor causing her to trip and fall landing on her left hip. She is complaining of some left hip and some mild left shoulder pain. Denies any numbness or tingling. Denies abdominal pain, chest pain, or other extremity pain. She denies hitting her head. Has chronic neck and lower back pain is unchanged. She reports that she was having some occasional dysuria, but mainly urgency and frequency over the past 1-2 weeks which was why she was dropping off a urine sample. Denies any fevers, flank pain, nausea, or vomiting. Denies blood thinner use.   Fall Pertinent negatives include no chest pain, no abdominal pain and no shortness of breath.  Hip Pain Pertinent negatives include no chest pain, no abdominal pain and no shortness of breath.       Prior to Admission medications   Medication Sig Start Date End Date Taking? Authorizing Provider  acetaminophen  (TYLENOL ) 500 MG tablet Take 1,000 mg by mouth every 6 (six) hours as needed for moderate pain or headache.    [provider]  amitriptyline  (ELAVIL ) 10 MG tablet Take 20 mg by mouth at bedtime.     [provider]  amLODipine  (NORVASC ) 5 MG tablet Take 5 mg by mouth daily.     [provider]  aspirin  81 MG chewable tablet Chew by mouth daily.    [provider]  B Complex-C (B-COMPLEX WITH VITAMIN C) tablet Take 1 tablet by mouth every evening.    [provider]  carvedilol  (COREG )  6.25 MG tablet Take 6.25 mg by mouth 2 (two) times daily.     [provider]  Cholecalciferol (VITAMIN D3) 1.25 MG (50000 UT) CAPS Take by mouth.    [provider]  Cyanocobalamin  (VITAMIN B-12 IJ) Inject 1,000 mg as directed every 28 (twenty-eight) days.     [provider]  DULoxetine  (CYMBALTA ) 60 MG capsule Take 60 mg by mouth 2 (two) times daily.    [provider]  fluticasone  (FLONASE ) 50 MCG/ACT nasal spray Place 1-2 sprays into the nose daily as needed for allergies.     [provider]  gabapentin  (NEURONTIN ) 400 MG capsule Take 1 capsule (400 mg total) by mouth 3 (three) times daily. Patient taking differently: Take 400-800 mg by mouth See admin instructions. Take 400 mg in the morning, 400 mg at lunch, and 800 mg at supper 02/06/13   Fairy Frames, MD  LEVEMIR  FLEXTOUCH 100 UNIT/ML Pen Inject 53 Units into the skin every morning.  07/25/14   [provider]  levothyroxine  (SYNTHROID ) 175 MCG tablet Take 175 mcg by mouth daily before breakfast.    [provider]  losartan -hydrochlorothiazide  (HYZAAR) 100-25 MG per tablet Take 1 tablet by mouth daily. For Hypertension    [provider]  Melatonin 3 MG CAPS Take 3 mg by mouth at bedtime.     [provider]  mometasone  (ELOCON ) 0.1 % cream Apply 1 application  topically daily as needed (rash).    [provider]  Multiple Vitamin (MULTIVITAMIN) tablet Take 1 tablet by mouth every evening.     [provider]  mupirocin  ointment (BACTROBAN ) 2 % Apply 1 application topically daily as needed (wound care).     [provider]  nitrofurantoin , macrocrystal-monohydrate, (MACROBID ) 100 MG capsule Take 1 capsule (100 mg total) by mouth 2 (two) times daily. 02/07/24   Jarold Olam HERO, PA-C  polyethylene glycol (MIRALAX  / GLYCOLAX ) packet Take 17 g by mouth daily as needed for mild constipation.     [provider]  Polyvinyl  Alcohol-Povidone (REFRESH OP) Place 1 drop into both eyes daily as needed (dry eyes).    [provider]  pravastatin  (PRAVACHOL ) 80 MG tablet Take 80 mg by mouth daily.    [provider]  Probiotic Product (PROBIOTIC DAILY PO) Take 1 capsule by mouth daily.    [provider]  tiZANidine  (ZANAFLEX ) 4 MG capsule Take 4 mg by mouth at bedtime as needed for muscle spasms.     [provider]  tolterodine (DETROL) 2 MG tablet Take 2 mg by mouth 2 (two) times daily.    [provider]  traMADol  (ULTRAM ) 50 MG tablet Take 1 tablet (50 mg total) by mouth every 6 (six) hours as needed. 02/26/22   Maranda Jamee Jacob, MD    Allergies: Nsaids, Onglyza [saxagliptin], Penicillins, Polysporin [bacitracin-polymyxin b], Cephalosporins, Ketoconazole, Neo-bacit-poly-lidocaine , Tioconazole, Lisinopril , Mucinex [guaifenesin er], and Victoza [liraglutide]    Review of Systems  Constitutional:  Negative for chills and fever.  Respiratory:  Negative for shortness of breath.   Cardiovascular:  Negative for chest pain.  Gastrointestinal:  Negative for abdominal pain, nausea and vomiting.  Genitourinary:  Positive for dysuria, frequency and urgency. Negative for flank pain and hematuria.  Musculoskeletal:  Positive for arthralgias.  Neurological:  Negative for syncope and numbness.    Updated Vital Signs BP (!) 150/66   Pulse 61   Temp 98 F (36.7 C) (Oral)   Resp 18   Ht 5' 3 (1.6 m)   Wt 86.1 kg   SpO2 100%   BMI 33.62 kg/m   Physical Exam Vitals and nursing note reviewed.  Constitutional:      General: She is not in acute distress.    Appearance: She is not ill-appearing or toxic-appearing.     Comments: On cell phone in no acute distress.  Eyes:     General: No scleral icterus. Neck:     Comments: No midline tenderness to palpation. Some paraspinal, however patient reports unchanged from chronic state.  Cardiovascular:     Rate and Rhythm: Normal  rate.     Pulses: Normal pulses.  Pulmonary:     Effort: Pulmonary effort is normal. No respiratory distress.  Abdominal:     Palpations: Abdomen is soft.     Tenderness: There is no abdominal tenderness. There is no guarding or rebound.     Comments: No hematoma or overlying skin changes/signs of trauma. Nontender to palpation. Soft.   Musculoskeletal:        General: Tenderness present.     Comments: Some tenderness to the left lateral hip. No overlying skin changes or sings of trauma. Compartments are soft. Sensation intact throughout. Pulses intact. Some tenderness to the left shoulder, but able to move without difficulty. No other tenderness throughout the other extremities. Strength intact. Coloration, temperature, and size appear and feel symmetric in the bilateral upper and lower extremities.  Skin:    General: Skin is warm and dry.  Neurological:     General: No focal deficit present.     Mental Status: She is alert and oriented to person, place, and time.     Cranial Nerves: No cranial nerve deficit.     Motor: No weakness.     (all labs ordered are listed, but only abnormal results are displayed) Labs Reviewed  URINALYSIS, ROUTINE W REFLEX MICROSCOPIC - Abnormal; Notable for the following components:      Result Value   APPearance HAZY (*)    Protein, ur 30 (*)    Nitrite POSITIVE (*)    Leukocytes,Ua LARGE (*)    Bacteria, UA MANY (*)    Non Squamous Epithelial 0-5 (*)    All other components within normal limits  CBC - Abnormal; Notable for the following components:   Hemoglobin 11.7 (*)    All other components within normal limits  BASIC METABOLIC PANEL WITH GFR - Abnormal; Notable for the following components:   Potassium 3.2 (*)    Glucose, Bld 111 (*)    BUN 24 (*)    Creatinine, Ser 1.44 (*)    GFR, Estimated 36 (*)    All other components within normal limits  URINE CULTURE    EKG: None  Radiology: CT PELVIS WO CONTRAST Result Date:  03/07/2024 CLINICAL DATA:  Hip trauma, fracture suspected, xray done fall, left hip pain EXAM: CT PELVIS WITHOUT CONTRAST TECHNIQUE: Multidetector CT imaging of the pelvis was performed following the standard protocol without intravenous contrast. RADIATION DOSE REDUCTION: This exam was performed according to the departmental dose-optimization program which includes automated exposure control, adjustment of the mA and/or kV according to patient size and/or use of iterative reconstruction technique. COMPARISON:  None Available. FINDINGS: Urinary Tract: Foci of gas within the urinary bladder lumen may be due to infection versus recent instrumentation. No abnormality visualized. Bowel:  Unremarkable visualized pelvic bowel loops. Vascular/Lymphatic: Atherosclerotic plaque. No pathologically enlarged lymph nodes. No significant vascular abnormality seen. Reproductive:  No mass or other significant abnormality Other: No intraperitoneal free fluid. No intraperitoneal free gas. No organized fluid collection. Musculoskeletal: Small fat containing umbilical hernia. No acute displaced fracture or dislocation of either hips. No acute displaced fracture or diastasis of the bones of the pelvis. Grade 1 anterolisthesis of L4 on L5. Partially visualized surgical hardware of the L4-L5 level. Severe degenerative changes visualized lumbar spine. IMPRESSION: 1. Negative for acute traumatic injury. 2. Foci of gas within the urinary bladder lumen may be due to infection versus recent instrumentation. Electronically Signed   By: Morgane  Naveau M.D.   On: 03/07/2024 20:27   CT Head Wo Contrast Result Date: 03/07/2024 CLINICAL DATA:  Head trauma, minor (Age >= 65y); Neck trauma (Age >= 65y). Fall EXAM: CT HEAD WITHOUT CONTRAST CT CERVICAL SPINE WITHOUT CONTRAST TECHNIQUE: Multidetector CT imaging of the head and cervical spine was performed following the standard protocol without intravenous contrast. Multiplanar CT image  reconstructions of the cervical spine were also generated. RADIATION DOSE REDUCTION: This exam was performed according to the departmental dose-optimization program which includes automated exposure control, adjustment of the mA and/or kV according to patient size and/or use of iterative reconstruction technique. COMPARISON:  CT head 02/13/2024, CT head and C-spine 02/07/2024, CT head and C-spine 08/29/2022 FINDINGS: CT HEAD FINDINGS Brain: Patchy and confluent areas of decreased attenuation are noted throughout the deep and periventricular white matter of the cerebral hemispheres bilaterally, compatible with chronic microvascular ischemic  disease. No evidence of large-territorial acute infarction. No parenchymal hemorrhage. No mass lesion. No extra-axial collection. No mass effect or midline shift. No hydrocephalus. Basilar cisterns are patent. Vascular: No hyperdense vessel. Atherosclerotic calcifications are present within the cavernous internal carotid arteries. Skull: No acute fracture or focal lesion. Sinuses/Orbits: Paranasal sinuses and mastoid air cells are clear. Bilateral lens replacement. Otherwise the orbits are unremarkable. Other: None. CT CERVICAL SPINE FINDINGS Alignment: Reversal normal cervical lordosis centered at the C4 level. Grade 1 anterolisthesis of C2 on C3. Skull base and vertebrae: Multilevel moderate severe degenerative changes of the spine. Chronic likely vertebral body hemangioma of the T1 level (6:25, 4:65). No acute fracture. No aggressive appearing focal osseous lesion or focal pathologic process. Soft tissues and spinal canal: No prevertebral fluid or swelling. No visible canal hematoma. Upper chest: Unremarkable. Other: None. IMPRESSION: 1. No acute intracranial abnormality. 2. No acute displaced fracture or traumatic listhesis of the cervical spine. Electronically Signed   By: Morgane  Naveau M.D.   On: 03/07/2024 20:17   CT Cervical Spine Wo Contrast Result Date:  03/07/2024 CLINICAL DATA:  Head trauma, minor (Age >= 65y); Neck trauma (Age >= 65y). Fall EXAM: CT HEAD WITHOUT CONTRAST CT CERVICAL SPINE WITHOUT CONTRAST TECHNIQUE: Multidetector CT imaging of the head and cervical spine was performed following the standard protocol without intravenous contrast. Multiplanar CT image reconstructions of the cervical spine were also generated. RADIATION DOSE REDUCTION: This exam was performed according to the departmental dose-optimization program which includes automated exposure control, adjustment of the mA and/or kV according to patient size and/or use of iterative reconstruction technique. COMPARISON:  CT head 02/13/2024, CT head and C-spine 02/07/2024, CT head and C-spine 08/29/2022 FINDINGS: CT HEAD FINDINGS Brain: Patchy and confluent areas of decreased attenuation are noted throughout the deep and periventricular white matter of the cerebral hemispheres bilaterally, compatible with chronic microvascular ischemic disease. No evidence of large-territorial acute infarction. No parenchymal hemorrhage. No mass lesion. No extra-axial collection. No mass effect or midline shift. No hydrocephalus. Basilar cisterns are patent. Vascular: No hyperdense vessel. Atherosclerotic calcifications are present within the cavernous internal carotid arteries. Skull: No acute fracture or focal lesion. Sinuses/Orbits: Paranasal sinuses and mastoid air cells are clear. Bilateral lens replacement. Otherwise the orbits are unremarkable. Other: None. CT CERVICAL SPINE FINDINGS Alignment: Reversal normal cervical lordosis centered at the C4 level. Grade 1 anterolisthesis of C2 on C3. Skull base and vertebrae: Multilevel moderate severe degenerative changes of the spine. Chronic likely vertebral body hemangioma of the T1 level (6:25, 4:65). No acute fracture. No aggressive appearing focal osseous lesion or focal pathologic process. Soft tissues and spinal canal: No prevertebral fluid or swelling. No  visible canal hematoma. Upper chest: Unremarkable. Other: None. IMPRESSION: 1. No acute intracranial abnormality. 2. No acute displaced fracture or traumatic listhesis of the cervical spine. Electronically Signed   By: Morgane  Naveau M.D.   On: 03/07/2024 20:17   DG Shoulder Left Result Date: 03/07/2024 CLINICAL DATA:  Pain after a fall. EXAM: LEFT SHOULDER - 2+ VIEW COMPARISON:  CT arthrogram left shoulder 06/24/2014 FINDINGS: Postoperative changes with reverse left shoulder arthroplasty. Components appear well seated. Resection of the distal clavicle with resurfacing of the acromion. No evidence of acute fracture or dislocation. No focal bone lesion or bone destruction. Soft tissues are unremarkable. IMPRESSION: Postoperative changes in the left shoulder. No acute bony abnormalities. Electronically Signed   By: Elsie Gravely M.D.   On: 03/07/2024 18:59   DG Hip Unilat With Pelvis 2-3  Views Left Result Date: 03/07/2024 CLINICAL DATA:  Left hip pain after a fall today. EXAM: DG HIP (WITH OR WITHOUT PELVIS) 2-3V LEFT COMPARISON:  None Available. FINDINGS: Postoperative changes in the lower lumbar spine. Pelvis and left hip appear intact. No evidence of acute fracture or dislocation. No focal bone lesion or bone destruction. SI joints and symphysis pubis are not displaced. Visualized sacrum appears intact. Soft tissues are unremarkable. IMPRESSION: No evidence of acute fracture or dislocation of the left hip. Electronically Signed   By: Elsie Gravely M.D.   On: 03/07/2024 17:57  Procedures   Medications Ordered in the ED  potassium chloride  (KLOR-CON ) CR tablet 40 mEq (has no administration in time range)    Clinical Course as of 03/09/24 1354  Thu Mar 07, 2024  2241 Spoke with son and relayed information and findings.  [RR]    Clinical Course User Index [RR] Bernis Ernst, PA-C   Medical Decision Making Amount and/or Complexity of Data Reviewed Labs: ordered. Radiology:  ordered.  Risk Prescription drug management.   83 y.o. female presents to the ER for evaluation of left hip pain after mechanical fall. Differential diagnosis includes but is not limited to trauma, soft tissue injury, fracture, dislocation, MSK pain, UTI, pyelonephritis. Vital signs elevated BP, otherwise unremarkable. Physical exam as noted above.   Patient has been seen in the ER multiple times for frequent falls. She has some tenderness to her left shoulder and left hip, but no other tenderness to palpation. The patient reports that she didn't hit her head, but then reports she wasn't sure. Isn't having any head or acute neck pain. Will obtain CT imaging and XRs  I independently reviewed and interpreted the patient's labs. BMP shows K at 3.2 which appears chronic, glucose at 111, BUN 24 and Cr 1.44, appears at baseline. CBC shows hemoglobin at 11.7, appears close to baseline of 12. Urinalysis shows hazy urine with protein, positive nitrites, large leukocytes, with >50 white blood ells and many bacteria. Will obtain culture.    XR shoulder Postoperative changes in the left shoulder. No acute bony abnormalities  XR of left hip/pelvis  No evidence of acute fracture or dislocation of the left hip.   Will obtain CT of the left hip for possible occult fracture.   CT pelvis  1. Negative for acute traumatic injury. 2. Foci of gas within the urinary bladder lumen may be due to infection versus recent instrumentation.   CT Head/neck 1. No acute intracranial abnormality. 2. No acute displaced fracture or traumatic listhesis of the cervical spine.   No recent cath, small gas in bladder likely from UTI. Will give dose of fosfomycin here. She was also given potassium while here as well. She has been ambulatory here with a walker at baseline and without increase in pain or difficulty. Given reassuring imaging and physical examination findings, she is stable for discharge home.   We discussed the results  of the labs/imaging. The plan is follow up as needed, supportive care. We discussed strict return precautions and red flag symptoms. The patient verbalized their understanding and agrees to the plan. The patient is stable and being discharged home in good condition.  Portions of this report may have been transcribed using voice recognition software. Every effort was made to ensure accuracy; however, inadvertent computerized transcription errors may be present.    Final diagnoses:  Fall, initial encounter  Left hip pain  Acute cystitis without hematuria    ED Discharge Orders  None          Bernis Ernst, PA-C 03/09/24 1443    Franklyn Sid SAILOR, MD 03/14/24 [phone]

## 2024-03-07 NOTE — ED Notes (Signed)
 [facility] called

## 2024-03-07 NOTE — ED Triage Notes (Addendum)
 BIB EMS from [facility] assisted living. Pt had fall, walker got stuck on a rug. Landed on left hip and is having pain. Pt has been able to bear weight, but has a lot of pain with that. No head injury, no blood thinners

## 2024-03-08 NOTE — ED Notes (Signed)
 Pt son, Velinda came to get pt to take home.

## 2024-03-10 LAB — URINE CULTURE: Culture: >=100000 — AB

## 2024-03-11 ENCOUNTER — Telehealth (HOSPITAL_BASED_OUTPATIENT_CLINIC_OR_DEPARTMENT_OTHER): Payer: Self-pay | Admitting: *Deleted

## 2024-03-11 DIAGNOSIS — H04123 Dry eye syndrome of bilateral lacrimal glands: Secondary | ICD-10-CM | POA: Diagnosis not present

## 2024-03-11 DIAGNOSIS — H16223 Keratoconjunctivitis sicca, not specified as Sjogren's, bilateral: Secondary | ICD-10-CM | POA: Diagnosis not present

## 2024-03-11 DIAGNOSIS — E119 Type 2 diabetes mellitus without complications: Secondary | ICD-10-CM | POA: Diagnosis not present

## 2024-03-11 DIAGNOSIS — D3132 Benign neoplasm of left choroid: Secondary | ICD-10-CM | POA: Diagnosis not present

## 2024-03-11 DIAGNOSIS — Z961 Presence of intraocular lens: Secondary | ICD-10-CM | POA: Diagnosis not present

## 2024-03-11 DIAGNOSIS — H353132 Nonexudative age-related macular degeneration, bilateral, intermediate dry stage: Secondary | ICD-10-CM | POA: Diagnosis not present

## 2024-03-11 NOTE — Telephone Encounter (Signed)
 Post ED Visit - Positive Culture Follow-up  Culture report reviewed by antimicrobial stewardship pharmacist: [facility] []  Rankin Dee, Pharm.D. []  Venetia Gully, Pharm.D., BCPS AQ-ID []  Garrel Crews, Pharm.D., BCPS []  Almarie Lunger, Pharm.D., BCPS []  [place], [place].D., BCPS, AAHIVP []  Rosaline Bihari, Pharm.D., BCPS, AAHIVP []  Vernell Meier, PharmD, BCPS []  Latanya Hint, PharmD, BCPS []  Donald Medley, PharmD, BCPS []  Rocky Bold, PharmD []  Dorothyann Alert, PharmD, BCPS []  Morene Babe, PharmD  [facility] [x]  Damien Quiet, PharmD []  Romona Bliss, PharmD []  Dolphus Roller, PharmD []  Veva Seip, Rph []  Vernell Daunt) Leonce, PharmD []  Eva Allis, PharmD []  Rosaline Millet, PharmD []  Iantha Batch, PharmD []  Arvin Gauss, PharmD []  Wanda Hasting, PharmD []  Ronal Rav, PharmD []  Rocky Slade, PharmD []  Bard Jeans, PharmD   Positive urine culture Treated with fosfomycin in ED, organism sensitive to the same and no further patient follow-up is required at this time.  Abigail [name] 03/11/2024, 10:02 AM

## 2024-03-13 DIAGNOSIS — E66811 Obesity, class 1: Secondary | ICD-10-CM | POA: Diagnosis not present

## 2024-03-13 DIAGNOSIS — R2689 Other abnormalities of gait and mobility: Secondary | ICD-10-CM | POA: Diagnosis not present

## 2024-03-13 DIAGNOSIS — W19XXXA Unspecified fall, initial encounter: Secondary | ICD-10-CM | POA: Diagnosis not present

## 2024-03-13 DIAGNOSIS — E876 Hypokalemia: Secondary | ICD-10-CM | POA: Diagnosis not present

## 2024-03-13 DIAGNOSIS — I10 Essential (primary) hypertension: Secondary | ICD-10-CM | POA: Diagnosis not present

## 2024-03-13 DIAGNOSIS — Z09 Encounter for follow-up examination after completed treatment for conditions other than malignant neoplasm: Secondary | ICD-10-CM | POA: Diagnosis not present

## 2024-03-13 DIAGNOSIS — Z6838 Body mass index (BMI) 38.0-38.9, adult: Secondary | ICD-10-CM | POA: Diagnosis not present

## 2024-03-13 DIAGNOSIS — M62511 Muscle wasting and atrophy, not elsewhere classified, right shoulder: Secondary | ICD-10-CM | POA: Diagnosis not present

## 2024-03-13 DIAGNOSIS — M62512 Muscle wasting and atrophy, not elsewhere classified, left shoulder: Secondary | ICD-10-CM | POA: Diagnosis not present

## 2024-03-13 DIAGNOSIS — N390 Urinary tract infection, site not specified: Secondary | ICD-10-CM | POA: Diagnosis not present

## 2024-03-14 DIAGNOSIS — R296 Repeated falls: Secondary | ICD-10-CM | POA: Diagnosis not present

## 2024-03-14 DIAGNOSIS — M6259 Muscle wasting and atrophy, not elsewhere classified, multiple sites: Secondary | ICD-10-CM | POA: Diagnosis not present

## 2024-03-14 DIAGNOSIS — R2681 Unsteadiness on feet: Secondary | ICD-10-CM | POA: Diagnosis not present

## 2024-03-15 DIAGNOSIS — M62512 Muscle wasting and atrophy, not elsewhere classified, left shoulder: Secondary | ICD-10-CM | POA: Diagnosis not present

## 2024-03-15 DIAGNOSIS — M62511 Muscle wasting and atrophy, not elsewhere classified, right shoulder: Secondary | ICD-10-CM | POA: Diagnosis not present

## 2024-03-15 DIAGNOSIS — N390 Urinary tract infection, site not specified: Secondary | ICD-10-CM | POA: Diagnosis not present

## 2024-03-18 DIAGNOSIS — M62511 Muscle wasting and atrophy, not elsewhere classified, right shoulder: Secondary | ICD-10-CM | POA: Diagnosis not present

## 2024-03-18 DIAGNOSIS — R2681 Unsteadiness on feet: Secondary | ICD-10-CM | POA: Diagnosis not present

## 2024-03-18 DIAGNOSIS — R296 Repeated falls: Secondary | ICD-10-CM | POA: Diagnosis not present

## 2024-03-18 DIAGNOSIS — M62512 Muscle wasting and atrophy, not elsewhere classified, left shoulder: Secondary | ICD-10-CM | POA: Diagnosis not present

## 2024-03-18 DIAGNOSIS — M6259 Muscle wasting and atrophy, not elsewhere classified, multiple sites: Secondary | ICD-10-CM | POA: Diagnosis not present

## 2024-03-20 DIAGNOSIS — R2681 Unsteadiness on feet: Secondary | ICD-10-CM | POA: Diagnosis not present

## 2024-03-20 DIAGNOSIS — M6259 Muscle wasting and atrophy, not elsewhere classified, multiple sites: Secondary | ICD-10-CM | POA: Diagnosis not present

## 2024-03-20 DIAGNOSIS — R296 Repeated falls: Secondary | ICD-10-CM | POA: Diagnosis not present

## 2024-03-21 DIAGNOSIS — M62511 Muscle wasting and atrophy, not elsewhere classified, right shoulder: Secondary | ICD-10-CM | POA: Diagnosis not present

## 2024-03-21 DIAGNOSIS — M62512 Muscle wasting and atrophy, not elsewhere classified, left shoulder: Secondary | ICD-10-CM | POA: Diagnosis not present

## 2024-03-21 DIAGNOSIS — R296 Repeated falls: Secondary | ICD-10-CM | POA: Diagnosis not present

## 2024-03-21 DIAGNOSIS — M6259 Muscle wasting and atrophy, not elsewhere classified, multiple sites: Secondary | ICD-10-CM | POA: Diagnosis not present

## 2024-03-21 DIAGNOSIS — R2681 Unsteadiness on feet: Secondary | ICD-10-CM | POA: Diagnosis not present

## 2024-03-26 DIAGNOSIS — M62512 Muscle wasting and atrophy, not elsewhere classified, left shoulder: Secondary | ICD-10-CM | POA: Diagnosis not present

## 2024-03-26 DIAGNOSIS — M62511 Muscle wasting and atrophy, not elsewhere classified, right shoulder: Secondary | ICD-10-CM | POA: Diagnosis not present

## 2024-03-28 DIAGNOSIS — R296 Repeated falls: Secondary | ICD-10-CM | POA: Diagnosis not present

## 2024-03-28 DIAGNOSIS — R2681 Unsteadiness on feet: Secondary | ICD-10-CM | POA: Diagnosis not present

## 2024-03-28 DIAGNOSIS — M6259 Muscle wasting and atrophy, not elsewhere classified, multiple sites: Secondary | ICD-10-CM | POA: Diagnosis not present

## 2024-04-02 DIAGNOSIS — M62512 Muscle wasting and atrophy, not elsewhere classified, left shoulder: Secondary | ICD-10-CM | POA: Diagnosis not present

## 2024-04-02 DIAGNOSIS — M62511 Muscle wasting and atrophy, not elsewhere classified, right shoulder: Secondary | ICD-10-CM | POA: Diagnosis not present

## 2024-04-03 DIAGNOSIS — R296 Repeated falls: Secondary | ICD-10-CM | POA: Diagnosis not present

## 2024-04-03 DIAGNOSIS — R2681 Unsteadiness on feet: Secondary | ICD-10-CM | POA: Diagnosis not present

## 2024-04-03 DIAGNOSIS — M6259 Muscle wasting and atrophy, not elsewhere classified, multiple sites: Secondary | ICD-10-CM | POA: Diagnosis not present

## 2024-04-05 DIAGNOSIS — R296 Repeated falls: Secondary | ICD-10-CM | POA: Diagnosis not present

## 2024-04-05 DIAGNOSIS — M62512 Muscle wasting and atrophy, not elsewhere classified, left shoulder: Secondary | ICD-10-CM | POA: Diagnosis not present

## 2024-04-05 DIAGNOSIS — R2681 Unsteadiness on feet: Secondary | ICD-10-CM | POA: Diagnosis not present

## 2024-04-05 DIAGNOSIS — M62511 Muscle wasting and atrophy, not elsewhere classified, right shoulder: Secondary | ICD-10-CM | POA: Diagnosis not present

## 2024-04-05 DIAGNOSIS — M6259 Muscle wasting and atrophy, not elsewhere classified, multiple sites: Secondary | ICD-10-CM | POA: Diagnosis not present

## 2024-04-08 DIAGNOSIS — M62512 Muscle wasting and atrophy, not elsewhere classified, left shoulder: Secondary | ICD-10-CM | POA: Diagnosis not present

## 2024-04-08 DIAGNOSIS — M62511 Muscle wasting and atrophy, not elsewhere classified, right shoulder: Secondary | ICD-10-CM | POA: Diagnosis not present

## 2024-04-08 DIAGNOSIS — R296 Repeated falls: Secondary | ICD-10-CM | POA: Diagnosis not present

## 2024-04-08 DIAGNOSIS — R2681 Unsteadiness on feet: Secondary | ICD-10-CM | POA: Diagnosis not present

## 2024-04-08 DIAGNOSIS — M6259 Muscle wasting and atrophy, not elsewhere classified, multiple sites: Secondary | ICD-10-CM | POA: Diagnosis not present

## 2024-04-09 DIAGNOSIS — R296 Repeated falls: Secondary | ICD-10-CM | POA: Diagnosis not present

## 2024-04-09 DIAGNOSIS — R2681 Unsteadiness on feet: Secondary | ICD-10-CM | POA: Diagnosis not present

## 2024-04-09 DIAGNOSIS — M6259 Muscle wasting and atrophy, not elsewhere classified, multiple sites: Secondary | ICD-10-CM | POA: Diagnosis not present

## 2024-04-10 DIAGNOSIS — M62511 Muscle wasting and atrophy, not elsewhere classified, right shoulder: Secondary | ICD-10-CM | POA: Diagnosis not present

## 2024-04-10 DIAGNOSIS — M62512 Muscle wasting and atrophy, not elsewhere classified, left shoulder: Secondary | ICD-10-CM | POA: Diagnosis not present

## 2024-04-15 DIAGNOSIS — M62512 Muscle wasting and atrophy, not elsewhere classified, left shoulder: Secondary | ICD-10-CM | POA: Diagnosis not present

## 2024-04-15 DIAGNOSIS — M62511 Muscle wasting and atrophy, not elsewhere classified, right shoulder: Secondary | ICD-10-CM | POA: Diagnosis not present

## 2024-04-16 DIAGNOSIS — Z23 Encounter for immunization: Secondary | ICD-10-CM | POA: Diagnosis not present

## 2024-04-17 DIAGNOSIS — M25522 Pain in left elbow: Secondary | ICD-10-CM | POA: Diagnosis not present

## 2024-04-17 DIAGNOSIS — M25521 Pain in right elbow: Secondary | ICD-10-CM | POA: Diagnosis not present

## 2024-04-18 DIAGNOSIS — R2681 Unsteadiness on feet: Secondary | ICD-10-CM | POA: Diagnosis not present

## 2024-04-18 DIAGNOSIS — R296 Repeated falls: Secondary | ICD-10-CM | POA: Diagnosis not present

## 2024-04-18 DIAGNOSIS — M62512 Muscle wasting and atrophy, not elsewhere classified, left shoulder: Secondary | ICD-10-CM | POA: Diagnosis not present

## 2024-04-18 DIAGNOSIS — M62511 Muscle wasting and atrophy, not elsewhere classified, right shoulder: Secondary | ICD-10-CM | POA: Diagnosis not present

## 2024-04-18 DIAGNOSIS — M6259 Muscle wasting and atrophy, not elsewhere classified, multiple sites: Secondary | ICD-10-CM | POA: Diagnosis not present

## 2024-04-22 DIAGNOSIS — Z23 Encounter for immunization: Secondary | ICD-10-CM | POA: Diagnosis not present

## 2024-04-23 DIAGNOSIS — D631 Anemia in chronic kidney disease: Secondary | ICD-10-CM | POA: Diagnosis not present

## 2024-04-23 DIAGNOSIS — E1122 Type 2 diabetes mellitus with diabetic chronic kidney disease: Secondary | ICD-10-CM | POA: Diagnosis not present

## 2024-04-23 DIAGNOSIS — I129 Hypertensive chronic kidney disease with stage 1 through stage 4 chronic kidney disease, or unspecified chronic kidney disease: Secondary | ICD-10-CM | POA: Diagnosis not present

## 2024-04-23 DIAGNOSIS — E039 Hypothyroidism, unspecified: Secondary | ICD-10-CM | POA: Diagnosis not present

## 2024-04-23 DIAGNOSIS — N390 Urinary tract infection, site not specified: Secondary | ICD-10-CM | POA: Diagnosis not present

## 2024-04-23 DIAGNOSIS — N1832 Chronic kidney disease, stage 3b: Secondary | ICD-10-CM | POA: Diagnosis not present

## 2024-04-29 ENCOUNTER — Other Ambulatory Visit (HOSPITAL_BASED_OUTPATIENT_CLINIC_OR_DEPARTMENT_OTHER): Payer: Self-pay | Admitting: Nephrology

## 2024-04-29 DIAGNOSIS — R296 Repeated falls: Secondary | ICD-10-CM | POA: Diagnosis not present

## 2024-04-29 DIAGNOSIS — N1832 Chronic kidney disease, stage 3b: Secondary | ICD-10-CM

## 2024-04-29 DIAGNOSIS — R2681 Unsteadiness on feet: Secondary | ICD-10-CM | POA: Diagnosis not present

## 2024-04-29 DIAGNOSIS — M6259 Muscle wasting and atrophy, not elsewhere classified, multiple sites: Secondary | ICD-10-CM | POA: Diagnosis not present

## 2024-05-07 DIAGNOSIS — R296 Repeated falls: Secondary | ICD-10-CM | POA: Diagnosis not present

## 2024-05-07 DIAGNOSIS — M62512 Muscle wasting and atrophy, not elsewhere classified, left shoulder: Secondary | ICD-10-CM | POA: Diagnosis not present

## 2024-05-07 DIAGNOSIS — M62511 Muscle wasting and atrophy, not elsewhere classified, right shoulder: Secondary | ICD-10-CM | POA: Diagnosis not present

## 2024-05-07 DIAGNOSIS — M6259 Muscle wasting and atrophy, not elsewhere classified, multiple sites: Secondary | ICD-10-CM | POA: Diagnosis not present

## 2024-05-07 DIAGNOSIS — R2681 Unsteadiness on feet: Secondary | ICD-10-CM | POA: Diagnosis not present

## 2024-05-08 DIAGNOSIS — N3020 Other chronic cystitis without hematuria: Secondary | ICD-10-CM | POA: Diagnosis not present

## 2024-05-08 DIAGNOSIS — N3946 Mixed incontinence: Secondary | ICD-10-CM | POA: Diagnosis not present

## 2024-05-10 ENCOUNTER — Encounter (HOSPITAL_BASED_OUTPATIENT_CLINIC_OR_DEPARTMENT_OTHER): Payer: Self-pay

## 2024-05-10 ENCOUNTER — Ambulatory Visit (HOSPITAL_BASED_OUTPATIENT_CLINIC_OR_DEPARTMENT_OTHER)

## 2024-05-10 DIAGNOSIS — M62512 Muscle wasting and atrophy, not elsewhere classified, left shoulder: Secondary | ICD-10-CM | POA: Diagnosis not present

## 2024-05-10 DIAGNOSIS — M62511 Muscle wasting and atrophy, not elsewhere classified, right shoulder: Secondary | ICD-10-CM | POA: Diagnosis not present
